# Patient Record
Sex: Male | Born: 1955
Health system: Southern US, Community
[De-identification: ages and names within clinical notes are randomized; demographics above are authoritative.]

## PROBLEM LIST (undated history)

## (undated) DIAGNOSIS — N3289 Other specified disorders of bladder: Secondary | ICD-10-CM

## (undated) DIAGNOSIS — I10 Essential (primary) hypertension: Secondary | ICD-10-CM

## (undated) DIAGNOSIS — Z860100 Personal history of colon polyps, unspecified: Secondary | ICD-10-CM

## (undated) DIAGNOSIS — R339 Retention of urine, unspecified: Secondary | ICD-10-CM

## (undated) DIAGNOSIS — M1990 Unspecified osteoarthritis, unspecified site: Secondary | ICD-10-CM

## (undated) DIAGNOSIS — I82409 Acute embolism and thrombosis of unspecified deep veins of unspecified lower extremity: Secondary | ICD-10-CM

## (undated) DIAGNOSIS — N400 Enlarged prostate without lower urinary tract symptoms: Secondary | ICD-10-CM

## (undated) DIAGNOSIS — N1330 Unspecified hydronephrosis: Secondary | ICD-10-CM

## (undated) DIAGNOSIS — K219 Gastro-esophageal reflux disease without esophagitis: Secondary | ICD-10-CM

## (undated) HISTORY — PX: KNEE ARTHROSCOPY: SUR90

## (undated) HISTORY — DX: Morbid (severe) obesity due to excess calories: E66.01

## (undated) HISTORY — PX: HAND SURGERY: SHX662

## (undated) HISTORY — PX: QUADRICEPS TENDON REPAIR: SHX756

---

## 2003-02-24 ENCOUNTER — Ambulatory Visit (HOSPITAL_COMMUNITY): Admission: RE | Admit: 2003-02-24 | Discharge: 2003-02-24 | Payer: Self-pay | Admitting: Orthopedic Surgery

## 2003-02-24 ENCOUNTER — Ambulatory Visit (HOSPITAL_BASED_OUTPATIENT_CLINIC_OR_DEPARTMENT_OTHER): Admission: RE | Admit: 2003-02-24 | Discharge: 2003-02-24 | Payer: Self-pay | Admitting: Orthopedic Surgery

## 2007-12-01 ENCOUNTER — Encounter: Admission: RE | Admit: 2007-12-01 | Discharge: 2007-12-01 | Payer: Self-pay | Admitting: Orthopaedic Surgery

## 2010-06-14 ENCOUNTER — Ambulatory Visit (HOSPITAL_COMMUNITY)
Admission: RE | Admit: 2010-06-14 | Discharge: 2010-06-14 | Disposition: A | Discharge status: Home / Self Care | Payer: PRIVATE HEALTH INSURANCE | Source: Ambulatory Visit | Attending: Gastroenterology | Admitting: Gastroenterology

## 2010-06-14 ENCOUNTER — Other Ambulatory Visit: Payer: Self-pay | Admitting: Gastroenterology

## 2010-06-14 DIAGNOSIS — Z1211 Encounter for screening for malignant neoplasm of colon: Secondary | ICD-10-CM | POA: Insufficient documentation

## 2010-06-14 DIAGNOSIS — K219 Gastro-esophageal reflux disease without esophagitis: Secondary | ICD-10-CM | POA: Insufficient documentation

## 2010-06-14 DIAGNOSIS — D128 Benign neoplasm of rectum: Secondary | ICD-10-CM | POA: Insufficient documentation

## 2010-06-14 DIAGNOSIS — Z79899 Other long term (current) drug therapy: Secondary | ICD-10-CM | POA: Insufficient documentation

## 2010-06-14 DIAGNOSIS — E785 Hyperlipidemia, unspecified: Secondary | ICD-10-CM | POA: Insufficient documentation

## 2010-06-14 DIAGNOSIS — D126 Benign neoplasm of colon, unspecified: Secondary | ICD-10-CM | POA: Insufficient documentation

## 2010-06-14 DIAGNOSIS — I10 Essential (primary) hypertension: Secondary | ICD-10-CM | POA: Insufficient documentation

## 2010-06-14 HISTORY — PX: COLONOSCOPY: SHX174

## 2010-06-17 NOTE — Op Note (Signed)
  NAME:  ELON, EOFF NO.:  [id number]  MEDICAL RECORD NO.:  [id number]           PATIENT TYPE:  O  LOCATION:  WLEN                         FACILITY:  [facility]  PHYSICIAN:  Danise Edge, M.D.   DATE OF BIRTH:  [birth year]  DATE OF PROCEDURE:  06/14/2010 DATE OF DISCHARGE:                              OPERATIVE REPORT   HISTORY:  Mr. Stephen Maldonado is a 55 year old male born Jun 28, 1955. The patient is scheduled to undergo his first screening colonoscopy with polypectomy to prevent colon cancer.  ENDOSCOPIST:  Danise Edge, M.D.  PREMEDICATION:  Fentanyl 100 mcg, Versed 10 mg.  PROCEDURE:  After obtaining informed consent, the patient was placed in the left lateral decubitus position.  Anal inspection and digital rectal examination were normal.  The Pentax pediatric colonoscope was introduced into the rectum and easily advanced to the cecum.  A normal- appearing ileocecal valve and appendiceal orifice were identified. Colonic preparation for the examination today was good.  Rectum.  From the distal rectum a 3-mm sessile polyp was removed with the cold biopsy forceps. Sigmoid colon and descending colon.  Colonic diverticulosis.  Two 3-mm sized polyps were removed from the descending colon with the cold biopsy forceps. Splenic flexure normal. Transverse colon normal. Hepatic flexure normal. Ascending colon normal. Cecum and ileocecal valve normal.  ASSESSMENT: 1. Left colonic diverticulosis. 2. A diminutive polyp was removed from the distal rectum with the cold     biopsy forceps. 3. Two diminutive polyps were removed from the descending colon with     the cold biopsy forceps.  RECOMMENDATIONS:  If the colon polyps return neoplastic pathologically, the patient should undergo a surveillance colonoscopy in 5 years.  If the colon polyps return nonneoplastic pathologically, the patient should undergo a repeat screening colonoscopy in 10  years.          ______________________________ Danise Edge, M.D.     MJ/MEDQ  D:  06/14/2010  T:  06/14/2010  Job:  [id number]  cc:   Pearla Dubonnet, M.D. Fax: [phone]  Electronically Signed by Danise Edge M.D. on 06/17/2010 08:53:30 AM

## 2010-06-22 NOTE — Op Note (Signed)
NAME:  Stephen Maldonado, Stephen Maldonado NO.:  [id number]   MEDICAL RECORD NO.:  [id number]                   PATIENT TYPE:  AMB   LOCATION:  DSC                                  FACILITY:  [facility]   PHYSICIAN:  Loreta Ave, M.D.              DATE OF BIRTH:  1955/08/04   DATE OF PROCEDURE:  02/24/2003  DATE OF DISCHARGE:                                 OPERATIVE REPORT   PREOPERATIVE DIAGNOSIS:  Subacute complete tear quadriceps tendon near  inferior pole patella, left distal thigh.   POSTOPERATIVE DIAGNOSIS:  Subacute complete tear quadriceps tendon near  inferior pole patella, left distal thigh.   OPERATIVE PROCEDURE:  Exploration, debridement, and then primary repair of  quadriceps tendon just above the superior pole of the patella utilizing #2  FiberWire suture x 4.   SURGEON:  Loreta Ave, M.D.   ASSISTANT:  Arlys Heliodoro D. Petrarca, P.A.-C.   ANESTHESIA:  General.   ESTIMATED BLOOD LOSS:  Minimal.   SPECIMENS:  None.   CULTURES:  None.   COMPLICATIONS:  None.   DRESSINGS:  Soft compressive with a Bledsoe brace locked in full extension.   PROCEDURE:  The patient was brought to the operating room and placed on the  operating table in supine position.  After adequate anesthesia had been  obtained, the left knee was examined.  Fairly good, almost full passive  motion.  Good stability.  Obvious defect in the quad tendon just above the  superior pole of the patella.  Tourniquet applied, prepped and draped in the  usual sterile fashion.  Exsanguinated with elevation of Esmarch and  tourniquet inflated to 350 mmHg.  A longitudinal incision centered over the  quadriceps tendon.  Skin and subcutaneous tissue divided.  Reactive fluid  debrided throughout.  The tear was very evident extending just in front of  the midline from medial to lateral through the quadriceps tendon as well as  a portion of the VMO musculature on the medial aspect.  There was about  1.5  cm of tendon still left above the patella, itself.  Very rounded off  margins.  All fluid was completed irrigated.  All tissue was mobilized to  get it end to end easily.  The joint was inspected through the defect with  no other gross findings.  The margins of the tendon was then debrided back  to healthy, bleeding tissue on either side.  The tendon was well captured  with FiberWire suture #2 which was weaved x 2 at the proximal end and x 2 at  the distal end.  Once this was firmly anchored above and below with  additional sutures being extended over the front of the patella and on  either side, the knee was extended and all sutures were tied end-to-end to  give me a nice firm closure and repair of the tendon.  Sutures from the  distal  limb were then further weaved up into the quad tendon and tied again  to themselves.  At completion and after freeing up the adhesions of the  proximal end of the tendon and musculature, I could bring it to 90 degrees  without too much tension on the repair.  The wound was thoroughly irrigated.  The repair was oversewn with Vicryl to get as smooth a contour.  The  subcutaneous tissue was closed with Vicryl and the skin was closed with  staples.  The margins of the wound were injected with Marcaine.  A sterile  compressive dressing was applied.  The tourniquet was inflated and removed.  The knee immobilizer was applied.  Anesthesia reversed.  Brought to the  recovery room.  Tolerated the surgery well without complications.                                               Loreta Ave, M.D.    DFM/MEDQ  D:  02/24/2003  T:  02/24/2003  Job:  [id number]

## 2015-08-23 DIAGNOSIS — I82409 Acute embolism and thrombosis of unspecified deep veins of unspecified lower extremity: Secondary | ICD-10-CM | POA: Diagnosis not present

## 2015-08-23 DIAGNOSIS — R300 Dysuria: Secondary | ICD-10-CM | POA: Diagnosis not present

## 2015-08-23 DIAGNOSIS — R600 Localized edema: Secondary | ICD-10-CM | POA: Diagnosis not present

## 2015-08-23 DIAGNOSIS — R972 Elevated prostate specific antigen [PSA]: Secondary | ICD-10-CM | POA: Diagnosis not present

## 2015-08-23 DIAGNOSIS — I10 Essential (primary) hypertension: Secondary | ICD-10-CM | POA: Diagnosis not present

## 2015-08-23 DIAGNOSIS — N19 Unspecified kidney failure: Secondary | ICD-10-CM | POA: Diagnosis not present

## 2015-08-25 ENCOUNTER — Other Ambulatory Visit: Payer: Self-pay | Admitting: Internal Medicine

## 2015-08-25 DIAGNOSIS — N19 Unspecified kidney failure: Secondary | ICD-10-CM

## 2015-08-28 ENCOUNTER — Ambulatory Visit
Admission: RE | Admit: 2015-08-28 | Discharge: 2015-08-28 | Disposition: A | Discharge status: Home / Self Care | Payer: BLUE CROSS/BLUE SHIELD | Source: Ambulatory Visit | Attending: Internal Medicine | Admitting: Internal Medicine

## 2015-08-28 DIAGNOSIS — N19 Unspecified kidney failure: Secondary | ICD-10-CM

## 2015-08-28 DIAGNOSIS — N1330 Unspecified hydronephrosis: Secondary | ICD-10-CM | POA: Diagnosis not present

## 2015-08-30 DIAGNOSIS — R338 Other retention of urine: Secondary | ICD-10-CM | POA: Diagnosis not present

## 2015-08-30 DIAGNOSIS — R972 Elevated prostate specific antigen [PSA]: Secondary | ICD-10-CM | POA: Diagnosis not present

## 2015-08-30 DIAGNOSIS — R3121 Asymptomatic microscopic hematuria: Secondary | ICD-10-CM | POA: Diagnosis not present

## 2015-08-30 DIAGNOSIS — R3912 Poor urinary stream: Secondary | ICD-10-CM | POA: Diagnosis not present

## 2015-08-30 DIAGNOSIS — N1330 Unspecified hydronephrosis: Secondary | ICD-10-CM | POA: Diagnosis not present

## 2015-09-11 DIAGNOSIS — N184 Chronic kidney disease, stage 4 (severe): Secondary | ICD-10-CM | POA: Diagnosis not present

## 2015-09-15 DIAGNOSIS — N1330 Unspecified hydronephrosis: Secondary | ICD-10-CM | POA: Diagnosis not present

## 2015-09-21 DIAGNOSIS — R338 Other retention of urine: Secondary | ICD-10-CM | POA: Diagnosis not present

## 2015-09-21 DIAGNOSIS — N401 Enlarged prostate with lower urinary tract symptoms: Secondary | ICD-10-CM | POA: Diagnosis not present

## 2015-09-21 DIAGNOSIS — N1330 Unspecified hydronephrosis: Secondary | ICD-10-CM | POA: Diagnosis not present

## 2015-09-21 DIAGNOSIS — R3121 Asymptomatic microscopic hematuria: Secondary | ICD-10-CM | POA: Diagnosis not present

## 2015-09-22 ENCOUNTER — Other Ambulatory Visit: Payer: Self-pay | Admitting: Urology

## 2015-09-26 ENCOUNTER — Encounter (HOSPITAL_BASED_OUTPATIENT_CLINIC_OR_DEPARTMENT_OTHER): Payer: Self-pay | Admitting: *Deleted

## 2015-09-26 NOTE — Progress Notes (Signed)
NPO AFTER MN.  ARRIVE AT 0945.  NEEDS ISTAT AND EKG.  WILL TAKE AM MEDS DOS W/ SIPS OF WATER AND IF NEEDED TAKE DITROPAN/ TYLENOL/ OXYCODONE.

## 2015-10-02 NOTE — H&P (Signed)
Office Visit Report     09/21/2015   --------------------------------------------------------------------------------   Stephen Maldonado  MRN: [id number]  PRIMARY CARE:  Ria Bush, MD  DOB: [birth year], 60 year old Male  REFERRING:  R Marcellus Scott, MD  SSN: -[id number]  PROVIDER:  [name], M.D.    LOCATION:  [facility] [phone]   --------------------------------------------------------------------------------   CC: I have urinary retention.  HPI: Stephen Maldonado is a 60 year-old male established patient who is here for urinary retention.  His problem was diagnosed 08/28/2015. His current symptoms did not begin after he had a surgical procedure. His urinary retention is being treated with foley catheter. Patient denies suprapubic tube, intemittent catheterization, flomax, hytrin, cardura, uroxatrol, rapaflo, avodart, and proscar.     CC: I have hydronephrosis.  HPI: His problem was diagnosed 08/28/2015. The problem is on both sides. He had the following x-rays done: Renal Ultrasound.   Kidneys appeared to have parenchymal atrophy. CKD Aug 24, 2015 Cr 3.67. Bladder distended - about 2.2 L. Foley placed 7/26.   follow-up CT scan revealed a decompressed bladder with decreased hydronephrosis. The calyces and the UPJ for example measured smaller than the prior ultrasound.   he saw Dr. Hollie Salk and had repeat labs September 11, 2015 with a BUN of 26, creatinine 2.07 and GFR 34.     CC/HPI: I have an enlarged prostate (follow-up).     Symptoms since 2012.     CC: I have blood in my urine.  HPI: He did not see the blood in his urine. He first noticed the blood 08/24/2015. He has not seen blood clots.   He does not have a burning sensation when he urinates. He is currently having trouble urinating.   His last U/S or CT Scan was 09/15/2015.   5-20 rbc's on U/A, few bac, no WBC's. Assoc with DVT, obesity and Eliquis use.   CT 09/15/2015 revealed some residual  hydroureteronephrosis bilaterally. A large capacity bladder which was decompressed with the Foley catheter. Prostate measured about 95 g. No worrisome findings.     CC: Elevated PSA-Established Patient  HPI: His last PSA was performed 08/24/2015. The last PSA value was 4.84.   PSA overall stable. His PSA Apr 2012 5.1, May 2012 was 4.87, July 2012 4.97. I located old records in our system.      ALLERGIES: No Allergies    MEDICATIONS: Nexium  Oxybutynin Chloride 5 mg tablet 1 tablet PO TID  Tamsulosin Hcl 0.4 mg capsule, ext release 24 hr 1 capsule PO Q HS  Diovan TABS Oral  Pain Ease     GU PSH: No GU PSH      PSH Notes: Quadricepsplasty, Quadricepsplasty, Knee Arthroscopy   NON-GU PSH: Knee Arthroscopy/surgery, Bilateral Revise Thigh Muscles - 2012, 2012    GU PMH: Asymptomatic microscopic hematuria - 08/30/2015 Elevated PSA - 08/30/2015, Elevated PSA, Elevated prostate specific antigen (PSA) - 2014 Hydronephrosis Unspec - 08/30/2015 Urinary Retention - 08/30/2015 Weak Urinary Stream - 08/30/2015      PMH Notes:  1898-02-04 00:00:00 - Note: Normal Routine History And Physical Adult   NON-GU PMH: Cardiac murmur, unspecified, Murmurs - 2014 Gout, unspecified, Gout - 2014 Acute embolism and thrombosis of unspecified deep veins of left lower extremity Essential (primary) hypertension Gastro-esophageal reflux disease without esophagitis Unspecified osteoarthritis, unspecified site    FAMILY HISTORY: 1 Daughter - Runs in Family Cancer - Mother Congestive Heart Failure - Father Diabetes - Mother Family Health Status Number -  Runs In Family Father Deceased At [place] ___ - Runs In Family Heart Disease - Father Mother Deceased At Age 59 from diabetic complicati - Runs In Family   SOCIAL HISTORY: Marital Status: Married Current Smoking Status: Patient has never smoked.  Drinks 2 drinks per day.  Drinks 2 caffeinated drinks per day.     Notes: Occupation:, Never A Smoker,  Marital History - Single, Caffeine Use, Alcohol Use   REVIEW OF SYSTEMS:    GU Review Male:   Patient denies frequent urination, hard to postpone urination, burning/ pain with urination, get up at night to urinate, leakage of urine, stream starts and stops, trouble starting your stream, have to strain to urinate , erection problems, and penile pain.  Gastrointestinal (Upper):   Patient denies nausea, vomiting, and indigestion/ heartburn.  Gastrointestinal (Lower):   Patient denies diarrhea and constipation.  Constitutional:   Patient denies fever, night sweats, weight loss, and fatigue.  Skin:   Patient denies skin rash/ lesion and itching.  Eyes:   Patient denies blurred vision and double vision.  Ears/ Nose/ Throat:   Patient denies sore throat and sinus problems.  Hematologic/Lymphatic:   Patient denies swollen glands and easy bruising.  Cardiovascular:   Patient denies leg swelling and chest pains.  Respiratory:   Patient denies cough and shortness of breath.  Endocrine:   Patient denies excessive thirst.  Musculoskeletal:   Patient reports back pain. Patient denies joint pain.  Neurological:   Patient denies headaches and dizziness.  Psychologic:   Patient denies depression and anxiety.   VITAL SIGNS:      09/21/2015 08:25 AM  BP 163/96 mmHg  Heart Rate 58 /min  Temperature 98.2 F / 37 C   MULTI-SYSTEM PHYSICAL EXAMINATION:    Constitutional: Well-nourished. No physical deformities. Normally developed. Good grooming.  Neck: Neck symmetrical, not swollen. Normal tracheal position.  Respiratory: No labored breathing, no use of accessory muscles.   Cardiovascular: Normal temperature, normal extremity pulses, no swelling, no varicosities.  Skin: No paleness, no jaundice, no cyanosis. No lesion, no ulcer, no rash.  Neurologic / Psychiatric: Oriented to time, oriented to place, oriented to person. No depression, no anxiety, no agitation.     PAST DATA REVIEWED:  Source Of History:   Patient   06/06/10  PSA  Total PSA 4.87   Free PSA 0.5   % Free PSA 10     PROCEDURES:         Flexible Cystoscopy - 52000  Risks, benefits, and some of the potential complications of the procedure were discussed with the patient. All questions were answered. Informed consent was obtained. Antibiotic prophylaxis was given -- Cipro. Sterile technique and intraurethral analgesia were used. He was filled with 300 ml. He felt the urge to void but could not.   Meatus:  Normal size. Normal location. Normal condition.  Urethra:  No strictures.  External Sphincter:  Normal.  Verumontanum:  Normal.  Prostate:  Obstructing - primarily a large left lateral lobe. Moderate hyperplasia.  Bladder Neck:  Non-obstructing.  Ureteral Orifices:  Normal location. Normal size. Normal shape. Effluxed clear urine.  Bladder:  No trabeculation. No tumors. Normal mucosa. No stones.      The lower urinary tract was carefully examined. The procedure was well-tolerated and without complications. Antibiotic instructions were given. Instructions were given to call the office immediately for bloody urine, difficulty urinating, painful urination, fever, chills, nausea, vomiting or other illness. The patient stated that he understood these instructions and  would comply with them.         Catheter / SP Tube - [id number] Simple Foley Catheterization  A 16 French Foley catheter was inserted into the bladder using sterile technique. The patient was taught routine catheter care. A urine culture was sent to the lab. A leg bag was connected.         Urinalysis w/Scope - 81001 Dipstick Dipstick Cont'd Micro  Specimen: Voided Bilirubin: Neg WBC/hpf: 10-20/hpf  Color: Yellow Ketones: Neg RBC/hpf: 20-40/hpf  Appearance: Cloudy Blood: 3+ Bacteria: Moderate (26-50/hpf)  Specific Gravity: 1.020 Protein: 3+ Cystals: Amorph Urates  pH: 6.5 Urobilinogen: 0.2 Casts: NS (Not Seen)  Glucose: Neg Nitrites: Neg Trichomonas: Not Present     Leukocyte Esterase: 3+ Mucous: Not Present      Epithelial Cells: 0-5/hpf      Yeast: NS (Not Seen)      Sperm: Not Present    ASSESSMENT:      ICD-10 Details  1 GU:   Urinary Retention - R33.8   2   Hydronephrosis Unspec - N13.30   3   Asymptomatic microscopic hematuria - R31.21   4   BPH w/LUTS - N40.1   5   Weak Urinary Stream - R39.12    PLAN:           Orders Labs Urine Culture and Sensitivity          Schedule Return Visit: Next Available Appointment - Schedule Surgery          Document Letter(s):  Created for Patient: Clinical Summary         Notes:   urinary retention-likely from chronic bladder outlet obstruction from BPH. He could have a hypotonic bladder at this point and we discussed urodynamics. He does not do well with catheterization or the cystoscopy and therefore we will hold off on that test. We also extensively discussed CIC and he cannot bring himself to do that.   BPH-he is on tamsulosin.we discussed the nature risks and benefits of adding a 5 alpha reductase inhibitor. We discussed the nature risks benefits and alternatives to outlet procedures. We discussed risk of continued retention, incontinence, stricture, erectile dysfunction, need for staged procedure, bleeding among others. We discussed in most cases flow symptoms and emptying improve but again there may be an issue with his bladder being hypotonic.   hydronephrosis-improved on imaging and labs. Will plan RGP's.   MH - benign eval.   PSA- stable.   cc: Dr. Inda Merlin     * Signed by [name], M.D. on 09/21/15 at 1:42 PM (EDT)*     The information contained in this medical record document is considered private and confidential patient information. This information can only be used for the medical diagnosis and/or medical services that are being provided by the patient's selected caregivers. This information can only be distributed outside of the patient's care if the patient agrees and signs  waivers of authorization for this information to be sent to an outside source or route.

## 2015-10-03 ENCOUNTER — Ambulatory Visit (HOSPITAL_BASED_OUTPATIENT_CLINIC_OR_DEPARTMENT_OTHER): Payer: BLUE CROSS/BLUE SHIELD | Admitting: Certified Registered"

## 2015-10-03 ENCOUNTER — Encounter (HOSPITAL_BASED_OUTPATIENT_CLINIC_OR_DEPARTMENT_OTHER): Payer: Self-pay | Admitting: *Deleted

## 2015-10-03 ENCOUNTER — Ambulatory Visit (HOSPITAL_BASED_OUTPATIENT_CLINIC_OR_DEPARTMENT_OTHER)
Admission: RE | Admit: 2015-10-03 | Discharge: 2015-10-03 | Disposition: A | Discharge status: Home / Self Care | Payer: BLUE CROSS/BLUE SHIELD | Source: Ambulatory Visit | Attending: Urology | Admitting: Urology

## 2015-10-03 ENCOUNTER — Encounter (HOSPITAL_BASED_OUTPATIENT_CLINIC_OR_DEPARTMENT_OTHER)
Admission: RE | Disposition: A | Discharge status: Home / Self Care | Payer: Self-pay | Source: Ambulatory Visit | Attending: Urology

## 2015-10-03 ENCOUNTER — Ambulatory Visit (HOSPITAL_COMMUNITY): Payer: BLUE CROSS/BLUE SHIELD

## 2015-10-03 DIAGNOSIS — N1370 Vesicoureteral-reflux, unspecified: Secondary | ICD-10-CM | POA: Insufficient documentation

## 2015-10-03 DIAGNOSIS — Z79899 Other long term (current) drug therapy: Secondary | ICD-10-CM | POA: Diagnosis not present

## 2015-10-03 DIAGNOSIS — N411 Chronic prostatitis: Secondary | ICD-10-CM | POA: Insufficient documentation

## 2015-10-03 DIAGNOSIS — M1990 Unspecified osteoarthritis, unspecified site: Secondary | ICD-10-CM | POA: Insufficient documentation

## 2015-10-03 DIAGNOSIS — N401 Enlarged prostate with lower urinary tract symptoms: Secondary | ICD-10-CM | POA: Insufficient documentation

## 2015-10-03 DIAGNOSIS — N1330 Unspecified hydronephrosis: Secondary | ICD-10-CM | POA: Diagnosis not present

## 2015-10-03 DIAGNOSIS — R339 Retention of urine, unspecified: Secondary | ICD-10-CM | POA: Diagnosis present

## 2015-10-03 DIAGNOSIS — R338 Other retention of urine: Secondary | ICD-10-CM | POA: Diagnosis not present

## 2015-10-03 DIAGNOSIS — R3912 Poor urinary stream: Secondary | ICD-10-CM | POA: Insufficient documentation

## 2015-10-03 DIAGNOSIS — K219 Gastro-esophageal reflux disease without esophagitis: Secondary | ICD-10-CM | POA: Insufficient documentation

## 2015-10-03 DIAGNOSIS — I10 Essential (primary) hypertension: Secondary | ICD-10-CM | POA: Insufficient documentation

## 2015-10-03 DIAGNOSIS — Z6837 Body mass index (BMI) 37.0-37.9, adult: Secondary | ICD-10-CM | POA: Diagnosis not present

## 2015-10-03 HISTORY — DX: Benign prostatic hyperplasia without lower urinary tract symptoms: N40.0

## 2015-10-03 HISTORY — DX: Other specified disorders of bladder: N32.89

## 2015-10-03 HISTORY — DX: Personal history of colon polyps, unspecified: Z86.0100

## 2015-10-03 HISTORY — DX: Personal history of colonic polyps: Z86.010

## 2015-10-03 HISTORY — DX: Retention of urine, unspecified: R33.9

## 2015-10-03 HISTORY — DX: Unspecified osteoarthritis, unspecified site: M19.90

## 2015-10-03 HISTORY — PX: CYSTOSCOPY W/ RETROGRADES: SHX1426

## 2015-10-03 HISTORY — PX: GREEN LIGHT LASER TURP (TRANSURETHRAL RESECTION OF PROSTATE: SHX6260

## 2015-10-03 HISTORY — DX: Unspecified hydronephrosis: N13.30

## 2015-10-03 HISTORY — DX: Essential (primary) hypertension: I10

## 2015-10-03 HISTORY — DX: Gastro-esophageal reflux disease without esophagitis: K21.9

## 2015-10-03 LAB — POCT I-STAT 4, (NA,K, GLUC, HGB,HCT)
Glucose, Bld: 102 mg/dL — ABNORMAL HIGH (ref 65–99)
HCT: 36 % — ABNORMAL LOW (ref 39.0–52.0)
Hemoglobin: 12.2 g/dL — ABNORMAL LOW (ref 13.0–17.0)
Potassium: 4.4 mmol/L (ref 3.5–5.1)
SODIUM: 143 mmol/L (ref 135–145)

## 2015-10-03 SURGERY — GREEN LIGHT LASER TURP (TRANSURETHRAL RESECTION OF PROSTATE
Anesthesia: General | Site: Prostate

## 2015-10-03 MED ORDER — DEXTROSE 5 % IV SOLN
3.0000 g | INTRAVENOUS | Status: AC
  Administered 2015-10-03: 3 g via INTRAVENOUS
  Filled 2015-10-03: qty 3000

## 2015-10-03 MED ORDER — LIDOCAINE HCL (CARDIAC) 20 MG/ML IV SOLN
INTRAVENOUS | Status: AC
  Filled 2015-10-03: qty 5

## 2015-10-03 MED ORDER — OXYCODONE-ACETAMINOPHEN 5-325 MG PO TABS
1.0000 | ORAL_TABLET | ORAL | Status: DC | PRN
  Administered 2015-10-03: 1 via ORAL
  Filled 2015-10-03: qty 2

## 2015-10-03 MED ORDER — OXYCODONE-ACETAMINOPHEN 5-325 MG PO TABS
ORAL_TABLET | ORAL | Status: AC
Start: 2015-10-03 — End: 2015-10-03
  Filled 2015-10-03: qty 1

## 2015-10-03 MED ORDER — ONDANSETRON HCL 4 MG/2ML IJ SOLN
INTRAMUSCULAR | Status: AC
  Filled 2015-10-03: qty 2

## 2015-10-03 MED ORDER — FUROSEMIDE 10 MG/ML IJ SOLN
INTRAMUSCULAR | Status: AC
  Filled 2015-10-03: qty 2

## 2015-10-03 MED ORDER — PROPOFOL 10 MG/ML IV BOLUS
INTRAVENOUS | Status: AC
  Filled 2015-10-03: qty 20

## 2015-10-03 MED ORDER — BELLADONNA ALKALOIDS-OPIUM 16.2-60 MG RE SUPP
RECTAL | Status: AC
  Filled 2015-10-03: qty 1

## 2015-10-03 MED ORDER — CEFAZOLIN IN D5W 1 GM/50ML IV SOLN
1.0000 g | INTRAVENOUS | Status: DC
  Filled 2015-10-03: qty 50

## 2015-10-03 MED ORDER — PROMETHAZINE HCL 25 MG/ML IJ SOLN
6.2500 mg | INTRAMUSCULAR | Status: DC | PRN
  Filled 2015-10-03: qty 1

## 2015-10-03 MED ORDER — MIDAZOLAM HCL 2 MG/2ML IJ SOLN
INTRAMUSCULAR | Status: AC
  Filled 2015-10-03: qty 2

## 2015-10-03 MED ORDER — FENTANYL CITRATE (PF) 100 MCG/2ML IJ SOLN
INTRAMUSCULAR | Status: AC
Start: 2015-10-03 — End: 2015-10-03
  Filled 2015-10-03: qty 2

## 2015-10-03 MED ORDER — FENTANYL CITRATE (PF) 100 MCG/2ML IJ SOLN
INTRAMUSCULAR | Status: DC | PRN
  Administered 2015-10-03: 50 ug via INTRAVENOUS
  Administered 2015-10-03 (×2): 25 ug via INTRAVENOUS
  Administered 2015-10-03: 50 ug via INTRAVENOUS
  Administered 2015-10-03 (×2): 25 ug via INTRAVENOUS

## 2015-10-03 MED ORDER — OXYCODONE-ACETAMINOPHEN 5-325 MG PO TABS: 1.0000 | ORAL_TABLET | ORAL | 0 refills | Status: DC | PRN

## 2015-10-03 MED ORDER — CEFAZOLIN IN D5W 1 GM/50ML IV SOLN
INTRAVENOUS | Status: AC
  Filled 2015-10-03: qty 50

## 2015-10-03 MED ORDER — ONDANSETRON HCL 4 MG/2ML IJ SOLN
INTRAMUSCULAR | Status: DC | PRN
Start: 2015-10-03 — End: 2015-10-03
  Administered 2015-10-03: 4 mg via INTRAVENOUS

## 2015-10-03 MED ORDER — FENTANYL CITRATE (PF) 100 MCG/2ML IJ SOLN
25.0000 ug | INTRAMUSCULAR | Status: DC | PRN
  Administered 2015-10-03: 25 ug via INTRAVENOUS
  Filled 2015-10-03: qty 1

## 2015-10-03 MED ORDER — MIDAZOLAM HCL 2 MG/2ML IJ SOLN
0.5000 mg | Freq: Once | INTRAMUSCULAR | Status: DC | PRN
  Filled 2015-10-03: qty 2

## 2015-10-03 MED ORDER — FENTANYL CITRATE (PF) 100 MCG/2ML IJ SOLN
INTRAMUSCULAR | Status: AC
  Filled 2015-10-03: qty 2

## 2015-10-03 MED ORDER — CEFAZOLIN SODIUM-DEXTROSE 2-4 GM/100ML-% IV SOLN
INTRAVENOUS | Status: AC
  Filled 2015-10-03: qty 100

## 2015-10-03 MED ORDER — MEPERIDINE HCL 25 MG/ML IJ SOLN
6.2500 mg | INTRAMUSCULAR | Status: DC | PRN
  Filled 2015-10-03: qty 1

## 2015-10-03 MED ORDER — PROPOFOL 10 MG/ML IV BOLUS
INTRAVENOUS | Status: DC | PRN
  Administered 2015-10-03: 250 mg via INTRAVENOUS

## 2015-10-03 MED ORDER — MIDAZOLAM HCL 5 MG/5ML IJ SOLN
INTRAMUSCULAR | Status: DC | PRN
  Administered 2015-10-03: 2 mg via INTRAVENOUS

## 2015-10-03 MED ORDER — SODIUM CHLORIDE 0.9 % IR SOLN
Status: DC | PRN
  Administered 2015-10-03: 25000 mL

## 2015-10-03 MED ORDER — IOHEXOL 300 MG/ML  SOLN
INTRAMUSCULAR | Status: DC | PRN
  Administered 2015-10-03: 46 mL

## 2015-10-03 MED ORDER — FUROSEMIDE 10 MG/ML IJ SOLN
INTRAMUSCULAR | Status: DC | PRN
  Administered 2015-10-03: 10 mg via INTRAMUSCULAR

## 2015-10-03 MED ORDER — BELLADONNA ALKALOIDS-OPIUM 16.2-60 MG RE SUPP
RECTAL | Status: DC | PRN
  Administered 2015-10-03: 1 via RECTAL

## 2015-10-03 MED ORDER — SULFAMETHOXAZOLE-TRIMETHOPRIM 400-80 MG PO TABS: 1.0000 | ORAL_TABLET | Freq: Every day | ORAL | 0 refills | Status: DC

## 2015-10-03 MED ORDER — DEXAMETHASONE SODIUM PHOSPHATE 4 MG/ML IJ SOLN
INTRAMUSCULAR | Status: DC | PRN
  Administered 2015-10-03: 10 mg via INTRAVENOUS

## 2015-10-03 MED ORDER — LACTATED RINGERS IV SOLN
INTRAVENOUS | Status: DC
Start: 2015-10-03 — End: 2015-10-03
  Administered 2015-10-03 (×2): via INTRAVENOUS
  Filled 2015-10-03: qty 1000

## 2015-10-03 MED ORDER — LIDOCAINE 2% (20 MG/ML) 5 ML SYRINGE
INTRAMUSCULAR | Status: DC | PRN
  Administered 2015-10-03: 60 mg via INTRAVENOUS

## 2015-10-03 MED ORDER — DEXAMETHASONE SODIUM PHOSPHATE 10 MG/ML IJ SOLN
INTRAMUSCULAR | Status: AC
  Filled 2015-10-03: qty 1

## 2015-10-03 SURGICAL SUPPLY — 46 items
BAG DRAIN URO-CYSTO SKYTR STRL (DRAIN) ×3 IMPLANT
BAG URINE DRAINAGE (UROLOGICAL SUPPLIES) ×6 IMPLANT
BASKET LASER NITINOL 1.9FR (BASKET) IMPLANT
BASKET STNLS GEMINI 4WIRE 3FR (BASKET) IMPLANT
BASKET ZERO TIP NITINOL 2.4FR (BASKET) IMPLANT
CATH COUDE FOLEY 2W 5CC 18FR (CATHETERS) ×3 IMPLANT
CATH FOLEY 2WAY SLVR  5CC 18FR (CATHETERS)
CATH FOLEY 2WAY SLVR 5CC 18FR (CATHETERS) IMPLANT
CATH INTERMIT  6FR 70CM (CATHETERS) ×3 IMPLANT
CATH URET 5FR 28IN CONE TIP (BALLOONS)
CATH URET 5FR 28IN OPEN ENDED (CATHETERS) IMPLANT
CATH URET 5FR 70CM CONE TIP (BALLOONS) IMPLANT
CATH URET DUAL LUMEN 6-10FR 50 (CATHETERS) IMPLANT
CLOTH BEACON ORANGE TIMEOUT ST (SAFETY) ×3 IMPLANT
ELECT BIVAP BIPO 22/24 DONUT (ELECTROSURGICAL)
ELECT LOOP MED HF 24F 12D (CUTTING LOOP) IMPLANT
ELECT REM PT RETURN 9FT ADLT (ELECTROSURGICAL)
ELECTRD BIVAP BIPO 22/24 DONUT (ELECTROSURGICAL) IMPLANT
ELECTRODE REM PT RTRN 9FT ADLT (ELECTROSURGICAL) IMPLANT
FIBER LASER FLEXIVA 365 (UROLOGICAL SUPPLIES) IMPLANT
FIBER LASER TRAC TIP (UROLOGICAL SUPPLIES) IMPLANT
GLOVE BIO SURGEON STRL SZ7.5 (GLOVE) ×3 IMPLANT
GOWN STRL REUS W/ TWL XL LVL3 (GOWN DISPOSABLE) ×2 IMPLANT
GOWN STRL REUS W/TWL LRG LVL3 (GOWN DISPOSABLE) ×3 IMPLANT
GOWN STRL REUS W/TWL XL LVL3 (GOWN DISPOSABLE) ×1
GUIDEWIRE 0.038 PTFE COATED (WIRE) IMPLANT
GUIDEWIRE ANG ZIPWIRE 038X150 (WIRE) IMPLANT
GUIDEWIRE STR DUAL SENSOR (WIRE) IMPLANT
HOLDER FOLEY CATH W/STRAP (MISCELLANEOUS) ×3 IMPLANT
IV NS 1000ML (IV SOLUTION) ×1
IV NS 1000ML BAXH (IV SOLUTION) ×2 IMPLANT
IV NS IRRIG 3000ML ARTHROMATIC (IV SOLUTION) ×24 IMPLANT
IV SET EXTENSION GRAVITY 40 LF (IV SETS) ×3 IMPLANT
KIT BALLIN UROMAX 15FX10 (LABEL) IMPLANT
KIT BALLN UROMAX 15FX4 (MISCELLANEOUS) IMPLANT
KIT BALLN UROMAX 26 75X4 (MISCELLANEOUS)
KIT ROOM TURNOVER WOR (KITS) ×3 IMPLANT
LASER FIBER /GREENLIGHT LASER (Laser) ×3 IMPLANT
LASER GREENLIGHT RENTAL P/PROC (Laser) ×3 IMPLANT
LOOP CUT BIPOLAR 24F LRG (ELECTROSURGICAL) ×3 IMPLANT
MANIFOLD NEPTUNE II (INSTRUMENTS) ×3 IMPLANT
PACK CYSTO (CUSTOM PROCEDURE TRAY) ×3 IMPLANT
SET HIGH PRES BAL DIL (LABEL)
SYR 30ML LL (SYRINGE) IMPLANT
SYRINGE IRR TOOMEY STRL 70CC (SYRINGE) IMPLANT
TUBE CONNECTING 12X1/4 (SUCTIONS) IMPLANT

## 2015-10-03 NOTE — Anesthesia Procedure Notes (Signed)
Procedure Name: LMA Insertion Date/Time: 10/03/2015 11:59 AM Performed by: Denna Haggard D Pre-anesthesia Checklist: Patient identified, Emergency Drugs available, Suction available and Patient being monitored Patient Re-evaluated:Patient Re-evaluated prior to inductionOxygen Delivery Method: Circle system utilized Preoxygenation: Pre-oxygenation with 100% oxygen Intubation Type: IV induction Ventilation: Mask ventilation without difficulty LMA: LMA inserted LMA Size: 4.0 Number of attempts: 1 Airway Equipment and Method: Bite block Placement Confirmation: positive ETCO2 Tube secured with: Tape Dental Injury: Teeth and Oropharynx as per pre-operative assessment

## 2015-10-03 NOTE — Anesthesia Postprocedure Evaluation (Signed)
Anesthesia Post Note  Patient: Kramer Dejesus  Procedure(s) Performed: Procedure(s) (LRB): GREEN LIGHT LASER,  TURP - STAGED(TRANSURETHRAL RESECTION OF PROSTATE (N/A) CYSTOSCOPY WITH RETROGRADE PYELOGRAM (Bilateral)  Patient location during evaluation: PACU Anesthesia Type: General Level of consciousness: awake and alert Pain management: pain level controlled Vital Signs Assessment: post-procedure vital signs reviewed and stable Respiratory status: spontaneous breathing, nonlabored ventilation, respiratory function stable and patient connected to nasal cannula oxygen Cardiovascular status: blood pressure returned to baseline and stable Postop Assessment: no signs of nausea or vomiting Anesthetic complications: no    Last Vitals:  Vitals:   10/03/15 1430 10/03/15 1445  BP: (!) 148/65 (!) 162/73  Pulse: 84 64  Resp: 14 17  Temp:      Last Pain:  Vitals:   10/03/15 1445  TempSrc:   PainSc: 5                  Zenaida Deed

## 2015-10-03 NOTE — Brief Op Note (Signed)
10/03/2015  2:10 PM  PATIENT:  Stephen Maldonado  60 y.o. male  PRE-OPERATIVE DIAGNOSIS:  BHP retention and hydronephosis  POST-OPERATIVE DIAGNOSIS:  BHP retention and hydronephosis  PROCEDURE:  Procedure(s): GREEN LIGHT LASER,  TURP - STAGED(TRANSURETHRAL RESECTION OF PROSTATE (N/A) CYSTOSCOPY WITH RETROGRADE PYELOGRAM (Bilateral)  SURGEON:  Surgeon(s) and Role:    * [name], MD - Primary  PHYSICIAN ASSISTANT:   ASSISTANTS: none   ANESTHESIA:   general  EBL:  Total I/O In: 1000 [I.V.:1000] Out: 180 [Urine:150; Blood:30]  BLOOD ADMINISTERED:none  DRAINS: Urinary Catheter (Foley) 18 Fr coude with 30 cc balloon   LOCAL MEDICATIONS USED:  B&O supp  SPECIMEN:  Source of Specimen:  TURP chips  DISPOSITION OF SPECIMEN:  PATHOLOGY  COUNTS:  YES  TOURNIQUET:  * No tourniquets in log *  DICTATION: .Dragon Dictation  PLAN OF CARE: Discharge to home after PACU  PATIENT DISPOSITION:  PACU - hemodynamically stable. Urine clear.    Delay start of Pharmacological VTE agent (>24hrs) due to surgical blood loss or risk of bleeding: not applicable

## 2015-10-03 NOTE — Interval H&P Note (Signed)
History and Physical Interval Note:  10/03/2015 11:42 AM  Stephen Maldonado  has presented today for surgery, with the diagnosis of BHP retention and hydronephosis  The various methods of treatment have been discussed with the patient and family. After consideration of risks, benefits and other options for treatment, the patient has consented to  Procedure(s): GREEN LIGHT LASER TURP (TRANSURETHRAL RESECTION OF PROSTATE (N/A) CYSTOSCOPY WITH RETROGRADE PYELOGRAM (N/A) as a surgical intervention .  The patient's history has been reviewed, patient examined, no change in status, stable for surgery.  I have reviewed the patient's chart and labs.  Questions were answered to the patient's satisfaction.  He had fever, left flank pain and gross hematuria after the last cath change/cysto when he called 8/20. All this resolved and he's been well the past 9 days. He did start Bactrim yesterday (took two doses and one this AM) and will get Ancef today.    [name]

## 2015-10-03 NOTE — OR Nursing (Signed)
Patient arrived into OR 1 with foley catheter removed per DR Junious Silk Order 150 mls cloudy urine discarded by Judd Gaudier RN.

## 2015-10-03 NOTE — Discharge Instructions (Signed)
Foley Catheter Care, Adult A Foley catheter is a soft, flexible tube. This tube is placed into your bladder to drain pee (urine). If you go home with this catheter in place, follow the instructions below. TAKING CARE OF THE CATHETER 1. Wash your hands with soap and water. 2. Put soap and water on a clean washcloth.  Clean the skin where the tube goes into your body.  Clean away from the tube site.  Never wipe toward the tube.  Clean the area using a circular motion.  Remove all the soap. Pat the area dry with a clean towel. For males, reposition the skin that covers the end of the penis (foreskin). 3. Attach the tube to your leg with tape or a leg strap. Do not stretch the tube tight. If you are using tape, remove any stickiness left behind by past tape you used. 4. Keep the drainage bag below your hips. Keep it off the floor. 5. Check your tube during the day. Make sure it is working and draining. Make sure the tube does not curl, twist, or bend. 6. Do not pull on the tube or try to take it out. TAKING CARE OF THE DRAINAGE BAGS You will have a large overnight drainage bag and a small leg bag. You may wear the overnight bag any time. Never wear the small bag at night. Follow the directions below. Emptying the Drainage Bag Empty your drainage bag when it is  - full or at least 2-3 times a day. 1. Wash your hands with soap and water. 2. Keep the drainage bag below your hips. 3. Hold the dirty bag over the toilet or clean container. 4. Open the pour spout at the bottom of the bag. Empty the pee into the toilet or container. Do not let the pour spout touch anything. 5. Clean the pour spout with a gauze pad or cotton ball that has rubbing alcohol on it. 6. Close the pour spout. 7. Attach the bag to your leg with tape or a leg strap. 8. Wash your hands well. Changing the Drainage Bag Change your bag once a month or sooner if it starts to smell or look dirty.  1. Wash your hands with soap  and water. 2. Pinch the rubber tube so that pee does not spill out. 3. Disconnect the catheter tube from the drainage tube at the connection valve. Do not let the tubes touch anything. 4. Clean the end of the catheter tube with an alcohol wipe. Clean the end of a the drainage tube with a different alcohol wipe. 5. Connect the catheter tube to the drainage tube of the clean drainage bag. 6. Attach the new bag to the leg with tape or a leg strap. Avoid attaching the new bag too tightly. 7. Wash your hands well. Cleaning the Drainage Bag 1. Wash your hands with soap and water. 2. Wash the bag in warm, soapy water. 3. Rinse the bag with warm water. 4. Fill the bag with a mixture of white vinegar and water (1 cup vinegar to 1 quart warm water [.2 liter vinegar to 1 liter warm water]). Close the bag and soak it for 30 minutes in the solution. 5. Rinse the bag with warm water. 6. Hang the bag to dry with the pour spout open and hanging downward. 7. Store the clean bag (once it is dry) in a clean plastic bag. 8. Wash your hands well. PREVENT INFECTION  Wash your hands before and after touching your tube.  Take showers every day. Wash the skin where the tube enters your body. Do not take baths. Replace wet leg straps with dry ones, if this applies.  Do not use powders, sprays, or lotions on the genital area. Only use creams, lotions, or ointments as told by your doctor.  For females, wipe from front to back after going to the bathroom.  Drink enough fluids to keep your pee clear or pale yellow unless you are told not to have too much fluid (fluid restriction).  Do not let the drainage bag or tubing touch or lie on the floor.  Wear cotton underwear to keep the area dry. GET HELP IF:  Your pee is cloudy or smells unusually bad.  Your tube becomes clogged.  You are not draining pee into the bag or your bladder feels full.  Your tube starts to leak. GET HELP RIGHT AWAY IF:  You have  pain, puffiness (swelling), redness, or yellowish-white fluid (pus) where the tube enters the body.  You have pain in the belly (abdomen), legs, lower back, or bladder.  You have a fever.  You see blood fill the tube, or your pee is pink or red.  You feel sick to your stomach (nauseous), throw up (vomit), or have chills.  Your tube gets pulled out. MAKE SURE YOU:   Understand these instructions.  Will watch your condition.  Will get help right away if you are not doing well or get worse.   This information is not intended to replace advice given to you by your health care provider. Make sure you discuss any questions you have with your health care provider.   Transurethral Resection and Vaporization of the Prostate, Care After Refer to this sheet in the next few weeks. These instructions provide you with information on caring for yourself after your procedure. Your caregiver also may give you specific instructions. Your treatment has been planned according to current medical practices, but complications sometimes occur. Call your caregiver if you have any problems or questions after your procedure. HOME CARE INSTRUCTIONS  Recovery can take 4-6 weeks. Avoid alcohol, caffeinated drinks, and spicy foods for 2 weeks after your procedure. Drink enough fluids to keep your urine clear or pale yellow. Urinate as soon as you feel the urge to do so. Do not try to hold your urine for long periods of time. During recovery you may experience pain caused by bladder spasms, which result in a very intense urge to urinate. Take all medicines as directed by your caregiver, including medicines for pain. Try to limit the amount of pain medicines you take because it can cause constipation. If you do become constipated, do not strain to move your bowels. Straining can increase bleeding. Constipation can be minimized by increasing the amount fluids and fiber in your diet. Your caregiver also may prescribe a stool  softener. Do not lift heavy objects (more than 5 lb [2.25 kg]) or perform exercises that cause you to strain for at least 1 month after your procedure. When sitting, you may want to sit in a soft chair or use a cushion. For the first 10 days after your procedure, avoid the following activities:  Running.  Strenuous work.  Long walks.  Riding in a car for extended periods.  Sex. SEEK MEDICAL CARE IF:  You have difficulty urinating.  You have blood in your urine that does not go away after you rest or increase your fluid intake.  You have swelling in your penis or  scrotum. SEEK IMMEDIATE MEDICAL CARE IF:   You are suddenly unable to urinate.  You notice blood clots in your urine.  You have chills.  You have a fever.  You have pain in your back or lower abdomen.  You have pain or swelling in your legs. MAKE SURE YOU:   Understand these instructions.  Will watch your condition.  Will get help right away if you are not doing well or get worse.   This information is not intended to replace advice given to you by your health care provider. Make sure you discuss any questions you have with your health care provider.   Document Released: 01/21/2005 Document Revised: 02/11/2014 Document Reviewed: 03/01/2011 Elsevier Interactive Patient Education 2016 [facility] Instructions  Activity: Get plenty of rest for the remainder of the day. A responsible adult should stay with you for 24 hours following the procedure.  For the next 24 hours, DO NOT: -Drive a car -Paediatric nurse -Drink alcoholic beverages -Take any medication unless instructed by your physician -Make any legal decisions or sign important papers.  Meals: Start with liquid foods such as gelatin or soup. Progress to regular foods as tolerated. Avoid greasy, spicy, heavy foods. If nausea and/or vomiting occur, drink only clear liquids until the nausea and/or vomiting subsides. Call  your physician if vomiting continues.  Special Instructions/Symptoms: Your throat may feel dry or sore from the anesthesia or the breathing tube placed in your throat during surgery. If this causes discomfort, gargle with warm salt water. The discomfort should disappear within 24 hours.  If you had a scopolamine patch placed behind your ear for the management of post- operative nausea and/or vomiting:  1. The medication in the patch is effective for 72 hours, after which it should be removed.  Wrap patch in a tissue and discard in the trash. Wash hands thoroughly with soap and water. 2. You may remove the patch earlier than 72 hours if you experience unpleasant side effects which may include dry mouth, dizziness or visual disturbances. 3. Avoid touching the patch. Wash your hands with soap and water after contact with the patch.

## 2015-10-03 NOTE — Op Note (Signed)
Preoperative diagnosis: BPH, urinary retention, gross hematuria, bilateral hydronephrosis Postoperative diagnosis: BPH, urinary retention, bilateral hydronephrosis, vesicoureteral reflux  Procedure: Cystoscopy with bilateral retrograde pyelogram, greenlight photo vaporization of the prostate, transurethral resection of the prostate-staged  Surgeon: Junious Silk  Anesthesia: Gen.  Indications for procedure: 60 year old with above diagnoses thought to be due to BPH and bladder outlet obstruction. He was brought for the above procedures.  Findings: On exam under anesthesia the penis was circumcised without mass or lesion. The testicles were descended bilaterally and palpably normal. On digital rectal exam the prostate was about 50 g, smooth without hard area or nodule.  On cystoscopy there were bilateral obstructing lateral prostatic lobes with an elongated prostatic urethra and elevated bladder base. The bladder itself was trabeculated with some mild erythema from the Foley. The ureteral orifices were well away from the prostate and there was brisk, clear efflux. There were no stones or foreign bodies in the bladder.  Right retrograde pyelogram-this outlined a single ureter single collecting system unit was some residual tortuosity in the mid and proximal ureter with mild hydro-nephrosis, dilation of the renal pelvis and collecting system and blunting of the calyces. This appeared improved compared to the prior ultrasound and stable compared to the prior CT. There was brisk efflux and drainage of contrast slower on the right compared to the left.  Left retrograde pyelogram-this outlined a single ureter single collecting system unit and there was some residual tortuosity in the mid and proximal ureter with mild hydronephrosis, dilation of the renal pelvis and collecting system and blunting of the calyces - less compared to the right. This appeared improved compared to the prior ultrasound and stable  compared to the prior CT. There was brisk efflux and drainage of contrast quicker on the left compared to the right.   Description of procedure: After consent was obtained patient brought to the operating room. After adequate anesthesia his placed in lithotomy position and prepped and draped in the usual sterile fashion. A timeout was performed to confirm the patient and procedure. An exam under anesthesia was performed. The cystoscope was passed per urethra and the prostate and bladder inspected. The right ureteral orifice was cannulated with a 6 [place] open-ended catheter and retrograde injection of contrast was performed. Similarly a left retrograde was performed. He was given 10 mg of IV Lasix and there was brisk efflux from both ureteral orifices. The ureteral orifices appeared gaping and widely patent. I suspect much of the hydronephrosis was from retention and reflux.  The laser fiber was then deployed and at 10 W summated incision at 6:00 through the prostatic urethra down to the bladder neck. Having found the proper depth this incision was brought down in the typical hockey-stick fashion bilaterally to the apices. I continually checked the location of the veru as well as the bladder neck in the ureteral orifices. As the bladder filled the ureteral orifices were well away from the prostate and bladder neck. The power was increased to 100 and then 120 W and I was able to vaporize the right lobe going from anterior to posterior bladder neck down to the veru. I then began on the left side and got about 50% of this done but there was some small arterial bleeders that I could not control with the greenlight. I increased the coagulation energy on the greenlight to 40 to no avail and I also decreased the vaporization 40 but still could not get these vessels to coagulate. Visualization was poor and I felt  it was safer to deploy the bipolar resectoscope and complete the left side and finish off the procedure.  Therefore the laser scope was removed the resectoscope continuous-flow sheath was placed with the visual obturator and then the handle. With the loop the bleeding vessels which were typical of a large prostate were quickly dealt with and hemostasis restored. Having completed the resection on the left side from bladder neck down to the area anterior to posterior, I cleaned off some of the floor and also some of the right side. I then resected some tissue at the apex but I did leave some tissue here. This created an excellent channel and hemostasis was excellent at low-pressure. All the chips were evacuated. The bladder and ureteral orifices were inspected again. Again there was excellent efflux from the ureteral orifices, I did not feel he needed stents. The bladder was filled and the scope removed. An 42 French coud catheter was placed, the balloon inflated and it was seated at the bladder neck. Urine was clear.  The patient was awakened and taken to recovery room in stable condition.  Complications: None  Blood loss: 30 mL  Drains: 76 French coud catheter with 30 mL balloon  Specimens to pathology: TURP chips

## 2015-10-03 NOTE — Anesthesia Preprocedure Evaluation (Addendum)
Anesthesia Evaluation  Patient identified by MRN, date of birth, ID band Patient awake    Reviewed: Allergy & Precautions, NPO status , Patient's Chart, lab work & pertinent test results  History of Anesthesia Complications Negative for: history of anesthetic complications  Airway Mallampati: III  TM Distance: >3 FB Neck ROM: Full    Dental  (+) Dental Advisory Given   Pulmonary neg pulmonary ROS,    breath sounds clear to auscultation       Cardiovascular hypertension, Pt. on medications (-) angina Rhythm:Regular Rate:Normal     Neuro/Psych negative neurological ROS     GI/Hepatic Neg liver ROS, GERD  Medicated and Controlled,  Endo/Other  Morbid obesity  Renal/GU hydronephrosis     Musculoskeletal  (+) Arthritis , Osteoarthritis,    Abdominal (+) + obese,   Peds  Hematology negative hematology ROS (+)   Anesthesia Other Findings   Reproductive/Obstetrics                            Anesthesia Physical Anesthesia Plan  ASA: II  Anesthesia Plan: General   Post-op Pain Management:    Induction: Intravenous  Airway Management Planned: LMA  Additional Equipment:   Intra-op Plan:   Post-operative Plan:   Informed Consent: I have reviewed the patients History and Physical, chart, labs and discussed the procedure including the risks, benefits and alternatives for the proposed anesthesia with the patient or authorized representative who has indicated his/her understanding and acceptance.   Dental advisory given  Plan Discussed with: CRNA and Surgeon  Anesthesia Plan Comments: (Plan routine monitors, GA- LMA OK)        Anesthesia Quick Evaluation

## 2015-10-03 NOTE — Transfer of Care (Signed)
Immediate Anesthesia Transfer of Care Note  Patient: Stephen Maldonado  Procedure(s) Performed: Procedure(s) (LRB): GREEN LIGHT LASER,  TURP - STAGED(TRANSURETHRAL RESECTION OF PROSTATE (N/A) CYSTOSCOPY WITH RETROGRADE PYELOGRAM (Bilateral)  Patient Location: PACU  Anesthesia Type: General  Level of Consciousness: awake, oriented, sedated and patient cooperative  Airway & Oxygen Therapy: Patient Spontanous Breathing and Patient connected to face mask oxygen  Post-op Assessment: Report given to PACU RN and Post -op Vital signs reviewed and stable  Post vital signs: Reviewed and stable  Complications: No apparent anesthesia complications  Last Vitals:  Vitals:   10/03/15 0953 10/03/15 1415  BP: (!) 154/73 (!) (P) 153/52  Pulse: 72   Resp: 18   Temp: 36.9 C (P) 36.5 C

## 2015-10-04 ENCOUNTER — Encounter (HOSPITAL_BASED_OUTPATIENT_CLINIC_OR_DEPARTMENT_OTHER): Payer: Self-pay | Admitting: Urology

## 2015-10-11 DIAGNOSIS — R338 Other retention of urine: Secondary | ICD-10-CM | POA: Diagnosis not present

## 2015-10-19 ENCOUNTER — Other Ambulatory Visit: Payer: Self-pay | Admitting: Internal Medicine

## 2015-10-19 ENCOUNTER — Ambulatory Visit
Admission: RE | Admit: 2015-10-19 | Discharge: 2015-10-19 | Disposition: A | Discharge status: Home / Self Care | Payer: BLUE CROSS/BLUE SHIELD | Source: Ambulatory Visit | Attending: Internal Medicine | Admitting: Internal Medicine

## 2015-10-19 DIAGNOSIS — R509 Fever, unspecified: Secondary | ICD-10-CM | POA: Diagnosis not present

## 2015-10-19 DIAGNOSIS — R0609 Other forms of dyspnea: Secondary | ICD-10-CM | POA: Diagnosis not present

## 2015-10-19 DIAGNOSIS — R05 Cough: Secondary | ICD-10-CM | POA: Diagnosis not present

## 2015-10-19 DIAGNOSIS — R0601 Orthopnea: Secondary | ICD-10-CM | POA: Diagnosis not present

## 2015-10-19 DIAGNOSIS — R8290 Unspecified abnormal findings in urine: Secondary | ICD-10-CM | POA: Diagnosis not present

## 2015-10-19 DIAGNOSIS — J9801 Acute bronchospasm: Secondary | ICD-10-CM

## 2015-11-13 DIAGNOSIS — R338 Other retention of urine: Secondary | ICD-10-CM | POA: Diagnosis not present

## 2015-11-13 DIAGNOSIS — N401 Enlarged prostate with lower urinary tract symptoms: Secondary | ICD-10-CM | POA: Diagnosis not present

## 2015-11-13 NOTE — Progress Notes (Signed)
Surgery on 12/01/15.  Need orders in [facility].

## 2015-11-13 NOTE — Progress Notes (Signed)
Surgery on 12/01/15.  Need orders in [facility]

## 2015-11-14 ENCOUNTER — Other Ambulatory Visit: Payer: Self-pay | Admitting: Orthopaedic Surgery

## 2015-11-21 DIAGNOSIS — N19 Unspecified kidney failure: Secondary | ICD-10-CM | POA: Diagnosis not present

## 2015-11-27 ENCOUNTER — Other Ambulatory Visit (HOSPITAL_COMMUNITY): Payer: Self-pay | Admitting: *Deleted

## 2015-11-27 NOTE — Patient Instructions (Signed)
Stephen Maldonado  11/27/2015   Your procedure is scheduled on: 12-01-15  Report to [facility] Main  Entrance take [facility]  elevators to 3rd floor to  [place] at 800 AM.  Call this number if you have problems the morning of surgery [phone]   Remember: ONLY 1 PERSON MAY GO WITH YOU TO SHORT STAY TO GET  READY MORNING OF [place].  Do not eat food or drink liquids :After Midnight.     Take these medicines the morning of surgery with A SIP OF WATER: DILTIAZEM (CARDIZEM CD), TAMSULOSIN (FLOMAX)              You may not have any metal on your body including hair pins and              piercings  Do not wear jewelry, make-up, lotions, powders or perfumes, deodorant             Do not wear nail polish.  Do not shave  48 hours prior to surgery.              Men may shave face and neck.   Do not bring valuables to the hospital. [place].  Contacts, dentures or bridgework may not be worn into surgery.  Leave suitcase in the car. After surgery it may be brought to your room.                  Please read over the following fact sheets you were given: _____________________________________________________________________             [facility] - Preparing for Surgery Before surgery, you can play an important role.  Because skin is not sterile, your skin needs to be as free of germs as possible.  You can reduce the number of germs on your skin by washing with CHG (chlorahexidine gluconate) soap before surgery.  CHG is an antiseptic cleaner which kills germs and bonds with the skin to continue killing germs even after washing. Please DO NOT use if you have an allergy to CHG or antibacterial soaps.  If your skin becomes reddened/irritated stop using the CHG and inform your nurse when you arrive at Short Stay. Do not shave (including legs and underarms) for at least 48 hours prior to the first CHG shower.  You may  shave your face/neck. Please follow these instructions carefully:  1.  Shower with CHG Soap the night before surgery and the  morning of Surgery.  2.  If you choose to wash your hair, wash your hair first as usual with your  normal  shampoo.  3.  After you shampoo, rinse your hair and body thoroughly to remove the  shampoo.                           4.  Use CHG as you would any other liquid soap.  You can apply chg directly  to the skin and wash                       Gently with a scrungie or clean washcloth.  5.  Apply the CHG Soap to your body ONLY FROM THE NECK DOWN.   Do not use on face/  open                           Wound or open sores. Avoid contact with eyes, ears mouth and genitals (private parts).                       Wash face,  Genitals (private parts) with your normal soap.             6.  Wash thoroughly, paying special attention to the area where your surgery  will be performed.  7.  Thoroughly rinse your body with warm water from the neck down.  8.  DO NOT shower/wash with your normal soap after using and rinsing off  the CHG Soap.                9.  Pat yourself dry with a clean towel.            10.  Wear clean pajamas.            11.  Place clean sheets on your bed the night of your first shower and do not  sleep with pets. Day of Surgery : Do not apply any lotions/deodorants the morning of surgery.  Please wear clean clothes to the hospital/surgery center.  FAILURE TO FOLLOW THESE INSTRUCTIONS MAY RESULT IN THE CANCELLATION OF YOUR SURGERY PATIENT SIGNATURE_________________________________  NURSE SIGNATURE__________________________________  ________________________________________________________________________

## 2015-11-27 NOTE — Progress Notes (Signed)
CHEST XRAY 10-19-15 EPIC EKG 10-03-15 [facility]

## 2015-11-28 ENCOUNTER — Encounter (HOSPITAL_COMMUNITY): Payer: Self-pay

## 2015-11-28 ENCOUNTER — Encounter (HOSPITAL_COMMUNITY)
Admission: RE | Admit: 2015-11-28 | Discharge: 2015-11-28 | Disposition: A | Discharge status: Home / Self Care | Payer: BLUE CROSS/BLUE SHIELD | Source: Ambulatory Visit | Attending: Orthopaedic Surgery | Admitting: Orthopaedic Surgery

## 2015-11-28 DIAGNOSIS — M1711 Unilateral primary osteoarthritis, right knee: Secondary | ICD-10-CM | POA: Diagnosis not present

## 2015-11-28 DIAGNOSIS — Z471 Aftercare following joint replacement surgery: Secondary | ICD-10-CM | POA: Diagnosis not present

## 2015-11-28 DIAGNOSIS — K219 Gastro-esophageal reflux disease without esophagitis: Secondary | ICD-10-CM | POA: Diagnosis not present

## 2015-11-28 DIAGNOSIS — Z6838 Body mass index (BMI) 38.0-38.9, adult: Secondary | ICD-10-CM | POA: Diagnosis not present

## 2015-11-28 DIAGNOSIS — E669 Obesity, unspecified: Secondary | ICD-10-CM | POA: Diagnosis not present

## 2015-11-28 DIAGNOSIS — R269 Unspecified abnormalities of gait and mobility: Secondary | ICD-10-CM | POA: Diagnosis not present

## 2015-11-28 DIAGNOSIS — Z96641 Presence of right artificial hip joint: Secondary | ICD-10-CM | POA: Diagnosis not present

## 2015-11-28 DIAGNOSIS — I10 Essential (primary) hypertension: Secondary | ICD-10-CM | POA: Diagnosis not present

## 2015-11-28 DIAGNOSIS — Z87891 Personal history of nicotine dependence: Secondary | ICD-10-CM | POA: Diagnosis not present

## 2015-11-28 DIAGNOSIS — N400 Enlarged prostate without lower urinary tract symptoms: Secondary | ICD-10-CM | POA: Diagnosis not present

## 2015-11-28 DIAGNOSIS — Z01818 Encounter for other preprocedural examination: Secondary | ICD-10-CM | POA: Insufficient documentation

## 2015-11-28 DIAGNOSIS — Z86718 Personal history of other venous thrombosis and embolism: Secondary | ICD-10-CM | POA: Diagnosis not present

## 2015-11-28 DIAGNOSIS — M25561 Pain in right knee: Secondary | ICD-10-CM | POA: Diagnosis not present

## 2015-11-28 HISTORY — DX: Acute embolism and thrombosis of unspecified deep veins of unspecified lower extremity: I82.409

## 2015-11-28 LAB — CBC
HEMATOCRIT: 34.9 % — AB (ref 39.0–52.0)
HEMOGLOBIN: 11.3 g/dL — AB (ref 13.0–17.0)
MCH: 28.8 pg (ref 26.0–34.0)
MCHC: 32.4 g/dL (ref 30.0–36.0)
MCV: 89 fL (ref 78.0–100.0)
Platelets: 200 10*3/uL (ref 150–400)
RBC: 3.92 MIL/uL — AB (ref 4.22–5.81)
RDW: 16.2 % — ABNORMAL HIGH (ref 11.5–15.5)
WBC: 7.9 10*3/uL (ref 4.0–10.5)

## 2015-11-28 LAB — SURGICAL PCR SCREEN
MRSA, PCR: NEGATIVE
Staphylococcus aureus: NEGATIVE

## 2015-11-28 LAB — BASIC METABOLIC PANEL
ANION GAP: 8 (ref 5–15)
BUN: 26 mg/dL — ABNORMAL HIGH (ref 6–20)
CHLORIDE: 110 mmol/L (ref 101–111)
CO2: 24 mmol/L (ref 22–32)
Calcium: 8.9 mg/dL (ref 8.9–10.3)
Creatinine, Ser: 2.39 mg/dL — ABNORMAL HIGH (ref 0.61–1.24)
GFR calc non Af Amer: 28 mL/min — ABNORMAL LOW (ref >60)
GFR, EST AFRICAN AMERICAN: 32 mL/min — AB (ref >60)
Glucose, Bld: 120 mg/dL — ABNORMAL HIGH (ref 65–99)
POTASSIUM: 3.8 mmol/L (ref 3.5–5.1)
Sodium: 142 mmol/L (ref 135–145)

## 2015-11-28 NOTE — Progress Notes (Addendum)
BMET ROUTED TO DR CHRISTOPHER Ninfa Linden INBASKET BY EPIC AND LEFT MESSAGE FOR [facility] BILLINGS ABOUT ELEVATED CREATININE

## 2015-11-30 MED ORDER — CEFAZOLIN SODIUM 10 G IJ SOLR
3.0000 g | INTRAMUSCULAR | Status: AC
  Administered 2015-12-01: 3 g via INTRAVENOUS
  Filled 2015-11-30: qty 3

## 2015-12-01 ENCOUNTER — Encounter (HOSPITAL_COMMUNITY): Payer: Self-pay | Admitting: *Deleted

## 2015-12-01 ENCOUNTER — Encounter (HOSPITAL_COMMUNITY)
Admission: RE | Disposition: A | Discharge status: Home Health | Payer: Self-pay | Source: Ambulatory Visit | Attending: Orthopaedic Surgery

## 2015-12-01 ENCOUNTER — Inpatient Hospital Stay (HOSPITAL_COMMUNITY)
Admission: RE | Admit: 2015-12-01 | Discharge: 2015-12-02 | DRG: 470 | Disposition: A | Discharge status: Home Health | Payer: BLUE CROSS/BLUE SHIELD | Source: Ambulatory Visit | Attending: Orthopaedic Surgery | Admitting: Orthopaedic Surgery

## 2015-12-01 ENCOUNTER — Inpatient Hospital Stay (HOSPITAL_COMMUNITY): Payer: BLUE CROSS/BLUE SHIELD | Admitting: Certified Registered Nurse Anesthetist

## 2015-12-01 ENCOUNTER — Inpatient Hospital Stay (HOSPITAL_COMMUNITY): Payer: BLUE CROSS/BLUE SHIELD

## 2015-12-01 DIAGNOSIS — I10 Essential (primary) hypertension: Secondary | ICD-10-CM | POA: Diagnosis present

## 2015-12-01 DIAGNOSIS — Z96651 Presence of right artificial knee joint: Secondary | ICD-10-CM

## 2015-12-01 DIAGNOSIS — M25561 Pain in right knee: Secondary | ICD-10-CM | POA: Diagnosis present

## 2015-12-01 DIAGNOSIS — K219 Gastro-esophageal reflux disease without esophagitis: Secondary | ICD-10-CM | POA: Diagnosis present

## 2015-12-01 DIAGNOSIS — Z6838 Body mass index (BMI) 38.0-38.9, adult: Secondary | ICD-10-CM

## 2015-12-01 DIAGNOSIS — M1711 Unilateral primary osteoarthritis, right knee: Secondary | ICD-10-CM | POA: Diagnosis not present

## 2015-12-01 DIAGNOSIS — Z87891 Personal history of nicotine dependence: Secondary | ICD-10-CM

## 2015-12-01 DIAGNOSIS — Z86718 Personal history of other venous thrombosis and embolism: Secondary | ICD-10-CM

## 2015-12-01 DIAGNOSIS — N400 Enlarged prostate without lower urinary tract symptoms: Secondary | ICD-10-CM | POA: Diagnosis present

## 2015-12-01 DIAGNOSIS — R269 Unspecified abnormalities of gait and mobility: Secondary | ICD-10-CM | POA: Diagnosis not present

## 2015-12-01 DIAGNOSIS — Z471 Aftercare following joint replacement surgery: Secondary | ICD-10-CM | POA: Diagnosis not present

## 2015-12-01 DIAGNOSIS — Z96641 Presence of right artificial hip joint: Secondary | ICD-10-CM | POA: Diagnosis not present

## 2015-12-01 DIAGNOSIS — E669 Obesity, unspecified: Secondary | ICD-10-CM | POA: Diagnosis present

## 2015-12-01 HISTORY — PX: TOTAL KNEE ARTHROPLASTY: SHX125

## 2015-12-01 LAB — BASIC METABOLIC PANEL
Anion gap: 6 (ref 5–15)
BUN: 25 mg/dL — AB (ref 6–20)
CHLORIDE: 107 mmol/L (ref 101–111)
CO2: 25 mmol/L (ref 22–32)
CREATININE: 2.38 mg/dL — AB (ref 0.61–1.24)
Calcium: 9.2 mg/dL (ref 8.9–10.3)
GFR calc Af Amer: 32 mL/min — ABNORMAL LOW (ref >60)
GFR calc non Af Amer: 28 mL/min — ABNORMAL LOW (ref >60)
Glucose, Bld: 97 mg/dL (ref 65–99)
Potassium: 4 mmol/L (ref 3.5–5.1)
Sodium: 138 mmol/L (ref 135–145)

## 2015-12-01 SURGERY — ARTHROPLASTY, KNEE, TOTAL
Anesthesia: Spinal | Site: Knee | Laterality: Right

## 2015-12-01 MED ORDER — METOCLOPRAMIDE HCL 5 MG PO TABS
5.0000 mg | ORAL_TABLET | Freq: Three times a day (TID) | ORAL | Status: DC | PRN
Start: 2015-12-01 — End: 2015-12-02

## 2015-12-01 MED ORDER — FENTANYL CITRATE (PF) 100 MCG/2ML IJ SOLN
INTRAMUSCULAR | Status: AC
  Filled 2015-12-01: qty 2

## 2015-12-01 MED ORDER — ONDANSETRON HCL 4 MG/2ML IJ SOLN: 4.0000 mg | Freq: Four times a day (QID) | INTRAMUSCULAR | Status: DC | PRN

## 2015-12-01 MED ORDER — COQ10 100 MG PO CAPS: 100.0000 mg | ORAL_CAPSULE | Freq: Every day | ORAL | Status: DC

## 2015-12-01 MED ORDER — HYDROMORPHONE HCL 1 MG/ML IJ SOLN
INTRAMUSCULAR | Status: AC
  Filled 2015-12-01: qty 1

## 2015-12-01 MED ORDER — VITAMIN D3 25 MCG (1000 UNIT) PO TABS
1000.0000 [IU] | ORAL_TABLET | Freq: Every day | ORAL | Status: DC
  Administered 2015-12-01 – 2015-12-02 (×2): 1000 [IU] via ORAL
  Filled 2015-12-01 (×2): qty 1

## 2015-12-01 MED ORDER — ASPIRIN EC 325 MG PO TBEC
325.0000 mg | DELAYED_RELEASE_TABLET | Freq: Two times a day (BID) | ORAL | Status: DC
  Filled 2015-12-01: qty 1

## 2015-12-01 MED ORDER — TAMSULOSIN HCL 0.4 MG PO CAPS
0.4000 mg | ORAL_CAPSULE | Freq: Every morning | ORAL | Status: DC
  Administered 2015-12-02: 0.4 mg via ORAL
  Filled 2015-12-01: qty 1

## 2015-12-01 MED ORDER — MIDAZOLAM HCL 5 MG/5ML IJ SOLN
INTRAMUSCULAR | Status: DC | PRN
  Administered 2015-12-01: 2 mg via INTRAVENOUS

## 2015-12-01 MED ORDER — PROPOFOL 500 MG/50ML IV EMUL
INTRAVENOUS | Status: DC | PRN
  Administered 2015-12-01: 50 ug/kg/min via INTRAVENOUS

## 2015-12-01 MED ORDER — HYDROMORPHONE HCL 1 MG/ML IJ SOLN
0.2500 mg | INTRAMUSCULAR | Status: DC | PRN
  Administered 2015-12-01 (×4): 0.5 mg via INTRAVENOUS

## 2015-12-01 MED ORDER — LACTATED RINGERS IV SOLN
INTRAVENOUS | Status: DC
  Administered 2015-12-01 (×2): via INTRAVENOUS

## 2015-12-01 MED ORDER — METHOCARBAMOL 1000 MG/10ML IJ SOLN
500.0000 mg | Freq: Four times a day (QID) | INTRAVENOUS | Status: DC | PRN
  Administered 2015-12-01: 500 mg via INTRAVENOUS
  Filled 2015-12-01: qty 550
  Filled 2015-12-01: qty 5

## 2015-12-01 MED ORDER — OXYCODONE HCL 5 MG PO TABS
5.0000 mg | ORAL_TABLET | ORAL | Status: DC | PRN
  Administered 2015-12-01: 5 mg via ORAL
  Administered 2015-12-01: 10 mg via ORAL
  Administered 2015-12-01: 5 mg via ORAL
  Administered 2015-12-01 – 2015-12-02 (×4): 10 mg via ORAL
  Filled 2015-12-01 (×3): qty 2
  Filled 2015-12-01: qty 1
  Filled 2015-12-01: qty 2
  Filled 2015-12-01: qty 1
  Filled 2015-12-01: qty 2

## 2015-12-01 MED ORDER — DILTIAZEM HCL ER COATED BEADS 240 MG PO CP24
240.0000 mg | ORAL_CAPSULE | Freq: Every day | ORAL | Status: DC
  Administered 2015-12-02: 240 mg via ORAL
  Filled 2015-12-01: qty 1

## 2015-12-01 MED ORDER — SODIUM CHLORIDE 0.9 % IR SOLN
Status: DC | PRN
  Administered 2015-12-01: 3000 mL

## 2015-12-01 MED ORDER — HYDROMORPHONE HCL 1 MG/ML IJ SOLN: 1.0000 mg | INTRAMUSCULAR | Status: DC | PRN

## 2015-12-01 MED ORDER — VITAMIN B-12 1000 MCG PO TABS
1000.0000 ug | ORAL_TABLET | Freq: Every day | ORAL | Status: DC
  Administered 2015-12-01 – 2015-12-02 (×2): 1000 ug via ORAL
  Filled 2015-12-01 (×2): qty 1

## 2015-12-01 MED ORDER — PROPOFOL 10 MG/ML IV BOLUS
INTRAVENOUS | Status: AC
  Filled 2015-12-01: qty 60

## 2015-12-01 MED ORDER — PROPOFOL 10 MG/ML IV BOLUS
INTRAVENOUS | Status: AC
  Filled 2015-12-01: qty 20

## 2015-12-01 MED ORDER — POLYETHYLENE GLYCOL 3350 17 G PO PACK: 17.0000 g | PACK | Freq: Every day | ORAL | Status: DC | PRN

## 2015-12-01 MED ORDER — ONDANSETRON HCL 4 MG/2ML IJ SOLN
INTRAMUSCULAR | Status: AC
  Filled 2015-12-01: qty 2

## 2015-12-01 MED ORDER — ONDANSETRON HCL 4 MG/2ML IJ SOLN
INTRAMUSCULAR | Status: DC | PRN
  Administered 2015-12-01: 4 mg via INTRAVENOUS

## 2015-12-01 MED ORDER — FENTANYL CITRATE (PF) 100 MCG/2ML IJ SOLN
INTRAMUSCULAR | Status: AC
  Administered 2015-12-01: 50 ug via INTRAVENOUS
  Filled 2015-12-01: qty 2

## 2015-12-01 MED ORDER — KETOROLAC TROMETHAMINE 15 MG/ML IJ SOLN
7.5000 mg | Freq: Four times a day (QID) | INTRAMUSCULAR | Status: AC
  Administered 2015-12-01: 7.5 mg via INTRAVENOUS
  Administered 2015-12-01: 15 mg via INTRAVENOUS
  Administered 2015-12-02: 7.5 mg via INTRAVENOUS
  Filled 2015-12-01 (×3): qty 1

## 2015-12-01 MED ORDER — LABETALOL HCL 5 MG/ML IV SOLN
INTRAVENOUS | Status: DC | PRN
  Administered 2015-12-01 (×2): 5 mg via INTRAVENOUS

## 2015-12-01 MED ORDER — ONDANSETRON HCL 4 MG/2ML IJ SOLN: 4.0000 mg | Freq: Once | INTRAMUSCULAR | Status: DC | PRN

## 2015-12-01 MED ORDER — MIDAZOLAM HCL 2 MG/2ML IJ SOLN
INTRAMUSCULAR | Status: AC
  Filled 2015-12-01: qty 2

## 2015-12-01 MED ORDER — METHOCARBAMOL 500 MG PO TABS
500.0000 mg | ORAL_TABLET | Freq: Four times a day (QID) | ORAL | Status: DC | PRN
  Administered 2015-12-01 – 2015-12-02 (×2): 500 mg via ORAL
  Filled 2015-12-01 (×2): qty 1

## 2015-12-01 MED ORDER — DOCUSATE SODIUM 100 MG PO CAPS
100.0000 mg | ORAL_CAPSULE | Freq: Two times a day (BID) | ORAL | Status: DC
  Administered 2015-12-01 – 2015-12-02 (×2): 100 mg via ORAL
  Filled 2015-12-01 (×2): qty 1

## 2015-12-01 MED ORDER — BUPIVACAINE IN DEXTROSE 0.75-8.25 % IT SOLN
INTRATHECAL | Status: DC | PRN
  Administered 2015-12-01: 2 mL via INTRATHECAL

## 2015-12-01 MED ORDER — ALUM & MAG HYDROXIDE-SIMETH 200-200-20 MG/5ML PO SUSP: 30.0000 mL | ORAL | Status: DC | PRN

## 2015-12-01 MED ORDER — ACETAMINOPHEN 650 MG RE SUPP: 650.0000 mg | Freq: Four times a day (QID) | RECTAL | Status: DC | PRN

## 2015-12-01 MED ORDER — DEXAMETHASONE SODIUM PHOSPHATE 10 MG/ML IJ SOLN
INTRAMUSCULAR | Status: DC | PRN
  Administered 2015-12-01: 10 mg via INTRAVENOUS

## 2015-12-01 MED ORDER — CEFAZOLIN IN D5W 1 GM/50ML IV SOLN
1.0000 g | Freq: Four times a day (QID) | INTRAVENOUS | Status: AC
  Administered 2015-12-01 (×2): 1 g via INTRAVENOUS
  Filled 2015-12-01 (×2): qty 50

## 2015-12-01 MED ORDER — PHENOL 1.4 % MT LIQD
1.0000 | OROMUCOSAL | Status: DC | PRN
Start: 2015-12-01 — End: 2015-12-02
  Filled 2015-12-01: qty 177

## 2015-12-01 MED ORDER — MENTHOL 3 MG MT LOZG: 1.0000 | LOZENGE | OROMUCOSAL | Status: DC | PRN

## 2015-12-01 MED ORDER — ACETAMINOPHEN 325 MG PO TABS: 650.0000 mg | ORAL_TABLET | Freq: Four times a day (QID) | ORAL | Status: DC | PRN

## 2015-12-01 MED ORDER — ONDANSETRON HCL 4 MG PO TABS: 4.0000 mg | ORAL_TABLET | Freq: Four times a day (QID) | ORAL | Status: DC | PRN

## 2015-12-01 MED ORDER — PROPOFOL 10 MG/ML IV BOLUS
INTRAVENOUS | Status: DC | PRN
  Administered 2015-12-01 (×4): 20 mg via INTRAVENOUS

## 2015-12-01 MED ORDER — FENTANYL CITRATE (PF) 100 MCG/2ML IJ SOLN
25.0000 ug | INTRAMUSCULAR | Status: DC | PRN
  Administered 2015-12-01 (×3): 50 ug via INTRAVENOUS

## 2015-12-01 MED ORDER — SODIUM CHLORIDE 0.9 % IV SOLN
INTRAVENOUS | Status: DC
  Administered 2015-12-01: 13:00:00 via INTRAVENOUS

## 2015-12-01 MED ORDER — METOCLOPRAMIDE HCL 5 MG/ML IJ SOLN: 5.0000 mg | Freq: Three times a day (TID) | INTRAMUSCULAR | Status: DC | PRN

## 2015-12-01 MED ORDER — DIPHENHYDRAMINE HCL 12.5 MG/5ML PO ELIX: 12.5000 mg | ORAL_SOLUTION | ORAL | Status: DC | PRN

## 2015-12-01 MED ORDER — FENTANYL CITRATE (PF) 100 MCG/2ML IJ SOLN
INTRAMUSCULAR | Status: DC | PRN
  Administered 2015-12-01 (×2): 50 ug via INTRAVENOUS

## 2015-12-01 SURGICAL SUPPLY — 50 items
BAG ZIPLOCK 12X15 (MISCELLANEOUS) ×2 IMPLANT
BANDAGE ACE 6X5 VEL STRL LF (GAUZE/BANDAGES/DRESSINGS) ×2 IMPLANT
BANDAGE ELASTIC 6 VELCRO ST LF (GAUZE/BANDAGES/DRESSINGS) ×2 IMPLANT
BENZOIN TINCTURE PRP APPL 2/3 (GAUZE/BANDAGES/DRESSINGS) ×2 IMPLANT
BLADE SAG 13.0X1.37X90 (BLADE) IMPLANT
BLADE SAG 18X100X1.27 (BLADE) ×2 IMPLANT
BOWL SMART MIX CTS (DISPOSABLE) ×2 IMPLANT
CAPT KNEE TOTAL 3 ×2 IMPLANT
CEMENT BONE SIMPLEX SPEEDSET (Cement) ×4 IMPLANT
CLOTH BEACON ORANGE TIMEOUT ST (SAFETY) ×2 IMPLANT
CUFF TOURN SGL QUICK 34 (TOURNIQUET CUFF) ×1
CUFF TRNQT CYL 34X4X40X1 (TOURNIQUET CUFF) ×1 IMPLANT
DRAPE U-SHAPE 47X51 STRL (DRAPES) ×2 IMPLANT
DRSG AQUACEL AG ADV 3.5X10 (GAUZE/BANDAGES/DRESSINGS) ×2 IMPLANT
DRSG PAD ABDOMINAL 8X10 ST (GAUZE/BANDAGES/DRESSINGS) ×2 IMPLANT
DURAPREP 26ML APPLICATOR (WOUND CARE) ×2 IMPLANT
ELECT REM PT RETURN 9FT ADLT (ELECTROSURGICAL) ×2
ELECTRODE REM PT RTRN 9FT ADLT (ELECTROSURGICAL) ×1 IMPLANT
GAUZE SPONGE 4X4 12PLY STRL (GAUZE/BANDAGES/DRESSINGS) ×2 IMPLANT
GAUZE XEROFORM 1X8 LF (GAUZE/BANDAGES/DRESSINGS) IMPLANT
GLOVE BIO SURGEON STRL SZ7.5 (GLOVE) ×2 IMPLANT
GLOVE BIOGEL PI IND STRL 8 (GLOVE) ×2 IMPLANT
GLOVE BIOGEL PI INDICATOR 8 (GLOVE) ×2
GLOVE ECLIPSE 8.0 STRL XLNG CF (GLOVE) ×2 IMPLANT
GOWN STRL REUS W/TWL XL LVL3 (GOWN DISPOSABLE) ×4 IMPLANT
HANDPIECE INTERPULSE COAX TIP (DISPOSABLE) ×1
IMMOBILIZER KNEE 20 (SOFTGOODS) ×2
IMMOBILIZER KNEE 20 THIGH 36 (SOFTGOODS) ×1 IMPLANT
NS IRRIG 1000ML POUR BTL (IV SOLUTION) ×2 IMPLANT
PACK TOTAL KNEE CUSTOM (KITS) ×2 IMPLANT
PAD ABD 8X10 STRL (GAUZE/BANDAGES/DRESSINGS) ×2 IMPLANT
PADDING CAST COTTON 6X4 STRL (CAST SUPPLIES) ×4 IMPLANT
POSITIONER SURGICAL ARM (MISCELLANEOUS) ×2 IMPLANT
SET HNDPC FAN SPRY TIP SCT (DISPOSABLE) ×1 IMPLANT
SET PAD KNEE POSITIONER (MISCELLANEOUS) ×2 IMPLANT
STAPLER VISISTAT 35W (STAPLE) IMPLANT
STRIP CLOSURE SKIN 1/2X4 (GAUZE/BANDAGES/DRESSINGS) ×2 IMPLANT
SUCTION FRAZIER HANDLE 12FR (TUBING) ×1
SUCTION TUBE FRAZIER 12FR DISP (TUBING) ×1 IMPLANT
SUT MNCRL AB 4-0 PS2 18 (SUTURE) IMPLANT
SUT VIC AB 0 CT1 27 (SUTURE) ×1
SUT VIC AB 0 CT1 27XBRD ANTBC (SUTURE) ×1 IMPLANT
SUT VIC AB 1 CT1 27 (SUTURE) ×2
SUT VIC AB 1 CT1 27XBRD ANTBC (SUTURE) ×2 IMPLANT
SUT VIC AB 2-0 CT1 27 (SUTURE) ×2
SUT VIC AB 2-0 CT1 TAPERPNT 27 (SUTURE) ×2 IMPLANT
TRAY FOLEY W/METER SILVER 16FR (SET/KITS/TRAYS/PACK) ×2 IMPLANT
WATER STERILE IRR 1500ML POUR (IV SOLUTION) ×2 IMPLANT
WRAP KNEE MAXI GEL POST OP (GAUZE/BANDAGES/DRESSINGS) ×2 IMPLANT
YANKAUER SUCT BULB TIP 10FT TU (MISCELLANEOUS) ×2 IMPLANT

## 2015-12-01 NOTE — Brief Op Note (Signed)
12/01/2015  10:48 AM  PATIENT:  Stephen Maldonado  60 y.o. male  PRE-OPERATIVE DIAGNOSIS:  Osteoarthritis right knee  POST-OPERATIVE DIAGNOSIS:  Osteoarthritis right knee  PROCEDURE:  Procedure(s): RIGHT TOTAL KNEE ARTHROPLASTY (Right)  SURGEON:  Surgeon(s) and Role:    * [name], MD - Primary  PHYSICIAN ASSISTANT: Benita Stabile, PA-C  ANESTHESIA:   spinal  EBL:  Total I/O In: 1000 [I.V.:1000] Out: 350 [Urine:350]  COUNTS:  YES  TOURNIQUET:   Total Tourniquet Time Documented: Thigh (Right) - 58 minutes Total: Thigh (Right) - 58 minutes   DICTATION: .Other Dictation: Dictation Number [id number]  PLAN OF CARE: Admit to inpatient   PATIENT DISPOSITION:  PACU - hemodynamically stable.   Delay start of Pharmacological VTE agent (>24hrs) due to surgical blood loss or risk of bleeding: no

## 2015-12-01 NOTE — Progress Notes (Signed)
PHARMACIST - PHYSICIAN ORDER COMMUNICATION  CONCERNING: P&T Medication Policy on Herbal Medications  DESCRIPTION:  This patient's order for:  Co-Q-10  has been noted.  This product(s) is classified as an "herbal" or natural product. Due to a lack of definitive safety studies or FDA approval, nonstandard manufacturing practices, plus the potential risk of unknown drug-drug interactions while on inpatient medications, the Pharmacy and Therapeutics Committee does not permit the use of "herbal" or natural products of this type within [facility].   ACTION TAKEN: The pharmacy department is unable to verify this order at this time and has been discontinued.  Please reevaluate patient's clinical condition at discharge and address if the herbal or natural product(s) should be resumed at that time.  Royetta Asal, PharmD, BCPS Pager [phone] 12/01/2015 1:28 PM

## 2015-12-01 NOTE — Evaluation (Signed)
Physical Therapy Evaluation Patient Details Name: Stephen Maldonado MRN: [id number] DOB: 11-Jan-1956 Today's Date: 12/01/2015   History of Present Illness  Pt s/p R TKR and with hx of bilat quad tendon repair  Clinical Impression  Pt s/p R TKR presents with decreased R LE strength/ROM and post op pain limiting functional mobility.  Pt should progress to dc home with family assist and HHPT follow up.   Follow Up Recommendations Home health PT    Equipment Recommendations  Rolling walker with 5" wheels    Recommendations for Other Services OT consult     Precautions / Restrictions Precautions Precautions: Knee;Fall Required Braces or Orthoses: Knee Immobilizer - Right Knee Immobilizer - Right: Discontinue once straight leg raise with < 10 degree lag Restrictions Weight Bearing Restrictions: No Other Position/Activity Restrictions: WBAT      Mobility  Bed Mobility Overal bed mobility: Needs Assistance Bed Mobility: Supine to Sit     Supine to sit: Min assist     General bed mobility comments: cues for sequence and use of L LE to self assist  Transfers Overall transfer level: Needs assistance Equipment used: Rolling walker (2 wheeled) Transfers: Sit to/from Stand Sit to Stand: Min assist         General transfer comment: cues for LE management and use of UEs to self assist  Ambulation/Gait Ambulation/Gait assistance: Min assist;Min guard Ambulation Distance (Feet): 55 Feet Assistive device: Rolling walker (2 wheeled) Gait Pattern/deviations: Step-to pattern;Decreased step length - right;Decreased step length - left;Shuffle;Trunk flexed Gait velocity: decr Gait velocity interpretation: Below normal speed for age/gender General Gait Details: cues for sequence, posture and position from [employer]            Wheelchair Mobility    Modified Rankin (Stroke Patients Only)       Balance                                              Pertinent Vitals/Pain Pain Assessment: 0-10 Pain Score: 5  Pain Location: R knee Pain Descriptors / Indicators: Aching;Sore Pain Intervention(s): Limited activity within patient's tolerance;Monitored during session;Premedicated before session;Ice applied    Home Living Family/patient expects to be discharged to:: Private residence Living Arrangements: Spouse/significant other Available Help at Discharge: Family Type of Home: House Home Access: Stairs to enter Entrance Stairs-Rails: None Technical brewer of Steps: 4 Home Layout: One level Home Equipment: Crutches      Prior Function Level of Independence: Independent               Hand Dominance        Extremity/Trunk Assessment   Upper Extremity Assessment: Overall WFL for tasks assessed           Lower Extremity Assessment: RLE deficits/detail      Cervical / Trunk Assessment: Normal  Communication   Communication: No difficulties  Cognition Arousal/Alertness: Awake/alert Behavior During Therapy: WFL for tasks assessed/performed Overall Cognitive Status: Within Functional Limits for tasks assessed                      General Comments      Exercises Total Joint Exercises Ankle Circles/Pumps: AROM;Both;15 reps;Supine   Assessment/Plan    PT Assessment Patient needs continued PT services  PT Problem List Decreased strength;Decreased range of motion;Decreased activity tolerance;Decreased mobility;Decreased knowledge of use of DME;Pain;Obesity  PT Treatment Interventions DME instruction;Gait training;Stair training;Functional mobility training;Therapeutic activities;Therapeutic exercise;Patient/family education    PT Goals (Current goals can be found in the Care Plan section)  Acute Rehab PT Goals Patient Stated Goal: Regain IND PT Goal Formulation: With patient Time For Goal Achievement: 12/05/15 Potential to Achieve Goals: Good    Frequency 7X/week   Barriers  to discharge        Co-evaluation               End of Session Equipment Utilized During Treatment: Gait belt;Right knee immobilizer Activity Tolerance: Patient tolerated treatment well Patient left: in chair;with call bell/phone within reach;with family/visitor present Nurse Communication: Mobility status         Time: [id number] PT Time Calculation (min) (ACUTE ONLY): 28 min   Charges:   PT Evaluation $PT Eval Low Complexity: 1 Procedure PT Treatments $Gait Training: 8-22 mins   PT G Codes:        Stephen Maldonado 12-26-15, 6:34 PM

## 2015-12-01 NOTE — Transfer of Care (Signed)
Immediate Anesthesia Transfer of Care Note  Patient: Stephen Maldonado  Procedure(s) Performed: Procedure(s): RIGHT TOTAL KNEE ARTHROPLASTY (Right)  Patient Location: PACU  Anesthesia Type:Spinal  Level of Consciousness:  sedated, patient cooperative and responds to stimulation  Airway & Oxygen Therapy:Patient Spontanous Breathing and Patient connected to face mask oxgen  Post-op Assessment:  Report given to PACU RN and Post -op Vital signs reviewed and stable  Post vital signs:  Reviewed and stable  Last Vitals:  Vitals:   12/01/15 0810  BP: (!) 144/64  Pulse: 71  Resp: 18  Temp: [id number] C    Complications: No apparent anesthesia complications

## 2015-12-01 NOTE — Anesthesia Postprocedure Evaluation (Signed)
Anesthesia Post Note  Patient: Stephen Maldonado  Procedure(s) Performed: Procedure(s) (LRB): RIGHT TOTAL KNEE ARTHROPLASTY (Right)  Patient location during evaluation: PACU Anesthesia Type: Spinal Level of consciousness: awake and alert Pain management: pain level controlled Vital Signs Assessment: post-procedure vital signs reviewed and stable Respiratory status: spontaneous breathing, nonlabored ventilation, respiratory function stable and patient connected to nasal cannula oxygen Cardiovascular status: blood pressure returned to baseline and stable Postop Assessment: no signs of nausea or vomiting, spinal receding, no backache and no headache Anesthetic complications: no    Last Vitals:  Vitals:   12/01/15 1145 12/01/15 1200  BP: 132/69 131/67  Pulse: 60 60  Resp: 11 13  Temp:      Last Pain:  Vitals:   12/01/15 1200  TempSrc:   PainSc: Asleep                 Evora Schechter JENNETTE

## 2015-12-01 NOTE — H&P (Signed)
TOTAL KNEE ADMISSION H&P  Patient is being admitted for right total knee arthroplasty.  Subjective:  Chief Complaint:right knee pain.  HPI: Stephen Maldonado, 60 y.o. male, has a history of pain and functional disability in the right knee due to arthritis and has failed non-surgical conservative treatments for greater than 12 weeks to includeNSAID's and/or analgesics, corticosteriod injections, flexibility and strengthening excercises, use of assistive devices, weight reduction as appropriate and activity modification.  Onset of symptoms was gradual, starting 9 years ago with gradually worsening course since that time. The patient noted prior procedures on the knee to include  arthroscopy on the right knee(s).  Patient currently rates pain in the right knee(s) at 10 out of 10 with activity. Patient has night pain, worsening of pain with activity and weight bearing, pain that interferes with activities of daily living, pain with passive range of motion, crepitus and joint swelling.  Patient has evidence of subchondral sclerosis, periarticular osteophytes and joint space narrowing by imaging studies. There is no active infection.  Patient Active Problem List   Diagnosis Date Noted  . Unilateral primary osteoarthritis, right knee 12/01/2015   Past Medical History:  Diagnosis Date  . Arthritis    knees  . Bladder spasms   . BPH (benign prostatic hyperplasia)   . DVT (deep venous thrombosis) ([place]) ocyt 2016   left leg tx medically  . GERD (gastroesophageal reflux disease)   . History of colon polyps    hyperplastic 06-14-2010  . Hydronephrosis   . Hypertension   . Urinary retention     Past Surgical History:  Procedure Laterality Date  . COLONOSCOPY  06/14/2010  . CYSTOSCOPY W/ RETROGRADES Bilateral 10/03/2015   Procedure: CYSTOSCOPY WITH RETROGRADE PYELOGRAM;  Surgeon: [name], MD;  Location: [facility];  Service: Urology;  Laterality: Bilateral;  . GREEN LIGHT LASER  TURP (TRANSURETHRAL RESECTION OF PROSTATE N/A 10/03/2015   Procedure: GREEN LIGHT LASER,  TURP - STAGED(TRANSURETHRAL RESECTION OF PROSTATE;  Surgeon: [name], MD;  Location: [facility];  Service: Urology;  Laterality: N/A;  . KNEE ARTHROSCOPY     not sure which knee  . QUADRICEPS TENDON REPAIR Bilateral left 02/24/2003/   right 2006    No prescriptions prior to admission.   No Known Allergies  Social History  Substance Use Topics  . Smoking status: Never Smoker  . Smokeless tobacco: Former Systems developer    Types: Chew    Quit date: 02/05/1968  . Alcohol use Yes     Comment: 1 2- beer per day    No family history on file.   Review of Systems  Musculoskeletal: Positive for joint pain.  All other systems reviewed and are negative.   Objective:  Physical Exam  Constitutional: He is oriented to person, place, and time. He appears well-developed and well-nourished.  HENT:  Head: Normocephalic and atraumatic.  Eyes: EOM are normal. Pupils are equal, round, and reactive to light.  Neck: Normal range of motion. Neck supple.  Cardiovascular: Normal rate and regular rhythm.   Respiratory: Effort normal and breath sounds normal.  GI: Soft. Bowel sounds are normal.  Musculoskeletal:       Right knee: He exhibits decreased range of motion, swelling, effusion and abnormal alignment. Tenderness found. Medial joint line and lateral joint line tenderness noted.  Neurological: He is alert and oriented to person, place, and time.  Skin: Skin is warm and dry.  Psychiatric: He has a normal mood and affect.    Vital  signs in last 24 hours:    Labs:   Estimated body mass index is 38.77 kg/m as calculated from the following:   Height as of 11/28/15: 5\' 11"  (1.803 m).   Weight as of 11/28/15: 278 lb (126.1 kg).   Imaging Review Plain radiographs demonstrate severe degenerative joint disease of the right knee(s). The overall alignment ismild varus. The bone quality appears  to be good for age and reported activity level.  Assessment/Plan:  End stage arthritis, right knee   The patient history, physical examination, clinical judgment of the provider and imaging studies are consistent with end stage degenerative joint disease of the right knee(s) and total knee arthroplasty is deemed medically necessary. The treatment options including medical management, injection therapy arthroscopy and arthroplasty were discussed at length. The risks and benefits of total knee arthroplasty were presented and reviewed. The risks due to aseptic loosening, infection, stiffness, patella tracking problems, thromboembolic complications and other imponderables were discussed. The patient acknowledged the explanation, agreed to proceed with the plan and consent was signed. Patient is being admitted for inpatient treatment for surgery, pain control, PT, OT, prophylactic antibiotics, VTE prophylaxis, progressive ambulation and ADL's and discharge planning. The patient is planning to be discharged home with home health services

## 2015-12-01 NOTE — Anesthesia Procedure Notes (Signed)
Spinal  Start time: 12/01/2015 9:05 AM End time: 12/01/2015 9:10 AM Staffing Anesthesiologist: Lauretta Grill Resident/CRNA: Darlys Gales R Performed: resident/CRNA  Preanesthetic Checklist Completed: patient identified, site marked, surgical consent, pre-op evaluation, timeout performed, IV checked, risks and benefits discussed and monitors and equipment checked Spinal Block Patient position: sitting Prep: DuraPrep Patient monitoring: heart rate, continuous pulse ox and blood pressure Approach: midline Location: L3-4 Injection technique: single-shot Needle Needle gauge: 24 G Needle length: 10 cm Needle insertion depth: 8 cm Assessment Sensory level: T6

## 2015-12-01 NOTE — Anesthesia Preprocedure Evaluation (Addendum)
Anesthesia Evaluation  Patient identified by MRN, date of birth, ID band Patient awake    Reviewed: Allergy & Precautions, NPO status , Patient's Chart, lab work & pertinent test results  History of Anesthesia Complications Negative for: history of anesthetic complications  Airway Mallampati: II  TM Distance: >3 FB Neck ROM: Full    Dental no notable dental hx. (+) Dental Advisory Given, Poor Dentition   Pulmonary neg pulmonary ROS,    Pulmonary exam normal breath sounds clear to auscultation       Cardiovascular hypertension, Pt. on medications Normal cardiovascular exam Rhythm:Regular Rate:Normal     Neuro/Psych negative neurological ROS  negative psych ROS   GI/Hepatic Neg liver ROS, GERD  ,  Endo/Other  obesity  Renal/GU negative Renal ROS  negative genitourinary   Musculoskeletal  (+) Arthritis ,   Abdominal   Peds negative pediatric ROS (+)  Hematology negative hematology ROS (+)   Anesthesia Other Findings   Reproductive/Obstetrics negative OB ROS                            Anesthesia Physical Anesthesia Plan  ASA: II  Anesthesia Plan: Spinal   Post-op Pain Management:    Induction:   Airway Management Planned:   Additional Equipment:   Intra-op Plan:   Post-operative Plan:   Informed Consent: I have reviewed the patients History and Physical, chart, labs and discussed the procedure including the risks, benefits and alternatives for the proposed anesthesia with the patient or authorized representative who has indicated his/her understanding and acceptance.   Dental advisory given  Plan Discussed with: CRNA  Anesthesia Plan Comments:         Anesthesia Quick Evaluation

## 2015-12-02 LAB — CBC
HCT: 32.2 % — ABNORMAL LOW (ref 39.0–52.0)
Hemoglobin: 10.4 g/dL — ABNORMAL LOW (ref 13.0–17.0)
MCH: 29.1 pg (ref 26.0–34.0)
MCHC: 32.3 g/dL (ref 30.0–36.0)
MCV: 90.2 fL (ref 78.0–100.0)
PLATELETS: 201 10*3/uL (ref 150–400)
RBC: 3.57 MIL/uL — AB (ref 4.22–5.81)
RDW: 16.1 % — ABNORMAL HIGH (ref 11.5–15.5)
WBC: 12.6 10*3/uL — ABNORMAL HIGH (ref 4.0–10.5)

## 2015-12-02 LAB — BASIC METABOLIC PANEL
Anion gap: 6 (ref 5–15)
BUN: 29 mg/dL — AB (ref 6–20)
CO2: 26 mmol/L (ref 22–32)
CREATININE: 2.35 mg/dL — AB (ref 0.61–1.24)
Calcium: 9 mg/dL (ref 8.9–10.3)
Chloride: 105 mmol/L (ref 101–111)
GFR calc Af Amer: 33 mL/min — ABNORMAL LOW (ref >60)
GFR, EST NON AFRICAN AMERICAN: 28 mL/min — AB (ref >60)
Glucose, Bld: 150 mg/dL — ABNORMAL HIGH (ref 65–99)
POTASSIUM: 5.1 mmol/L (ref 3.5–5.1)
SODIUM: 137 mmol/L (ref 135–145)

## 2015-12-02 MED ORDER — METHOCARBAMOL 500 MG PO TABS: 500.0000 mg | ORAL_TABLET | Freq: Four times a day (QID) | ORAL | 0 refills | Status: DC | PRN

## 2015-12-02 MED ORDER — OXYCODONE-ACETAMINOPHEN 5-325 MG PO TABS: 1.0000 | ORAL_TABLET | ORAL | 0 refills | Status: DC | PRN

## 2015-12-02 NOTE — Progress Notes (Signed)
Patient ID: Stephen Maldonado, male   DOB: 1955/07/23, 60 y.o.   MRN: [id number] Doing very well.  Can be discharged to home today.

## 2015-12-02 NOTE — Evaluation (Signed)
Occupational Therapy Evaluation Patient Details Name: Stephen Maldonado MRN: [id number] DOB: 20-Jun-1955 Today's Date: 12/02/2015    History of Present Illness Pt s/p R TKR and with hx of bilat quad tendon repair   Clinical Impression   OT education complete    Follow Up Recommendations  No OT follow up    Equipment Recommendations  None recommended by OT       Precautions / Restrictions Precautions Precautions: Knee;Fall Required Braces or Orthoses: Knee Immobilizer - Right Knee Immobilizer - Right: Discontinue once straight leg raise with < 10 degree lag Restrictions Weight Bearing Restrictions: No Other Position/Activity Restrictions: WBAT      Mobility Bed Mobility               General bed mobility comments: pt in chair  Transfers Overall transfer level: Needs assistance Equipment used: Rolling walker (2 wheeled) Transfers: Sit to/from [employer] Sit to Stand: Supervision Stand pivot transfers: Supervision       General transfer comment: cues for LE management and use of UEs to self assist         ADL Overall ADL's : Needs assistance/impaired                     Lower Body Dressing: Minimal assistance;Sit to/from stand;Cueing for safety;Cueing for sequencing;Cueing for compensatory techniques   Toilet Transfer: Supervision/safety;Regular Toilet;RW;Ambulation;Cueing for sequencing;Cueing for safety   Toileting- Clothing Manipulation and Hygiene: Supervision/safety;Sit to/from stand   Tub/ Shower Transfer: Walk-in shower;Cueing for safety;Cueing for sequencing;Supervision/safety     General ADL Comments: wife will A as needed               Pertinent Vitals/Pain Pain Score: 3  Pain Location: r knee Pain Descriptors / Indicators: Aching Pain Intervention(s): Monitored during session;Repositioned;Ice applied     Hand Dominance     Extremity/Trunk Assessment Upper Extremity Assessment Upper Extremity  Assessment: Overall WFL for tasks assessed           Communication Communication Communication: No difficulties   Cognition Arousal/Alertness: Awake/alert Behavior During Therapy: WFL for tasks assessed/performed Overall Cognitive Status: Within Functional Limits for tasks assessed                                Home Living Family/patient expects to be discharged to:: Private residence Living Arrangements: Spouse/significant other Available Help at Discharge: Family Type of Home: House Home Access: Stairs to enter Technical brewer of Steps: 4 Entrance Stairs-Rails: None Home Layout: One level     Bathroom Shower/Tub: Tub/shower unit;Walk-in Psychologist, prison and probation services: Standard     Home Equipment: Crutches          Prior Functioning/Environment Level of Independence: Independent                 OT Problem List:     OT Treatment/Interventions:      OT Goals(Current goals can be found in the care plan section) Acute Rehab OT Goals Patient Stated Goal: Regain IND  OT Frequency:                End of Session Equipment Utilized During Treatment: Rolling walker Nurse Communication: Mobility status  Activity Tolerance: Patient tolerated treatment well Patient left: in chair;with call bell/phone within reach   Time: 1055-1115 OT Time Calculation (min): 20 min Charges:  OT General Charges $OT Visit: 1 Procedure OT Evaluation $OT Eval Low Complexity: 1 Procedure  G-Codes:    Betsy Pries 29-Dec-2015, 12:33 PM

## 2015-12-02 NOTE — Discharge Summary (Signed)
Patient ID: Stephen Maldonado MRN: [id number] DOB/AGE: 11-Jul-1955 60 y.o.  Admit date: 12/01/2015 Discharge date: 12/02/2015  Admission Diagnoses:  Principal Problem:   Unilateral primary osteoarthritis, right knee Active Problems:   Status post total right knee replacement   Discharge Diagnoses:  Same  Past Medical History:  Diagnosis Date  . Arthritis    knees  . Bladder spasms   . BPH (benign prostatic hyperplasia)   . DVT (deep venous thrombosis) ([place]) ocyt 2016   left leg tx medically  . GERD (gastroesophageal reflux disease)   . History of colon polyps    hyperplastic 06-14-2010  . Hydronephrosis   . Hypertension   . Urinary retention     Surgeries: Procedure(s): RIGHT TOTAL KNEE ARTHROPLASTY on 12/01/2015   Consultants:   Discharged Condition: Improved  Hospital Course: Stephen Maldonado is an 60 y.o. male who was admitted 12/01/2015 for operative treatment ofUnilateral primary osteoarthritis, right knee. Patient has severe unremitting pain that affects sleep, daily activities, and work/hobbies. After pre-op clearance the patient was taken to the operating room on 12/01/2015 and underwent  Procedure(s): RIGHT TOTAL KNEE ARTHROPLASTY.    Patient was given perioperative antibiotics: Anti-infectives    Start     Dose/Rate Route Frequency Ordered Stop   12/01/15 1600  ceFAZolin (ANCEF) IVPB 1 g/50 mL premix     1 g 100 mL/hr over 30 Minutes Intravenous Every 6 hours 12/01/15 1320 12/01/15 2146   12/01/15 0600  ceFAZolin (ANCEF) 3 g in dextrose 5 % 50 mL IVPB     3 g 130 mL/hr over 30 Minutes Intravenous On call to O.R. 11/30/15 1326 12/01/15 0912       Patient was given sequential compression devices, early ambulation, and chemoprophylaxis to prevent DVT.  Patient benefited maximally from hospital stay and there were no complications.    Recent vital signs: Patient Vitals for the past 24 hrs:  BP Temp Temp src Pulse Resp SpO2  12/02/15 0937 (!) 151/63 98 F  (36.7 C) Oral 63 16 98 %  12/02/15 0520 (!) 154/51 98 F (36.7 C) Oral (!) 57 16 97 %  12/02/15 0258 138/60 97.9 F (36.6 C) Oral 67 16 98 %  12/01/15 2229 (!) 172/71 98.2 F (36.8 C) Oral 68 16 93 %  12/01/15 1754 (!) 160/68 97.3 F (36.3 C) Oral 66 16 98 %  12/01/15 1610 (!) 161/61 97.8 F (36.6 C) Oral 65 16 98 %  12/01/15 1510 (!) 150/71 98.3 F (36.8 C) Oral 66 16 98 %  12/01/15 1410 (!) 141/52 98.3 F (36.8 C) Oral 62 14 98 %  12/01/15 1316 136/62 98 F (36.7 C) - - 14 98 %  12/01/15 1300 (!) 147/77 97.5 F (36.4 C) - (!) 54 12 98 %  12/01/15 1245 136/72 - - 64 12 100 %  12/01/15 1230 131/75 97.5 F (36.4 C) - 63 (!) 21 99 %  12/01/15 1215 121/64 - - (!) 57 12 98 %  12/01/15 1200 131/67 - - 60 13 99 %  12/01/15 1145 132/69 - - 60 11 96 %  12/01/15 1130 132/69 - - 69 16 99 %  12/01/15 1115 (!) 152/75 98.7 F (37.1 C) - 65 10 100 %     Recent laboratory studies:  Recent Labs  12/01/15 0809 12/02/15 0507  WBC  --  12.6*  HGB  --  10.4*  HCT  --  32.2*  PLT  --  201  NA 138 137  K 4.0 5.1  CL 107 105  CO2 25 26  BUN 25* 29*  CREATININE 2.38* 2.35*  GLUCOSE 97 150*  CALCIUM 9.2 9.0     Discharge Medications:     Medication List    STOP taking these medications   sulfamethoxazole-trimethoprim 400-80 MG tablet Commonly known as:  BACTRIM     TAKE these medications   acetaminophen 500 MG tablet Commonly known as:  TYLENOL Take 1,000 mg by mouth every 6 (six) hours as needed (for pain.).   aspirin EC 81 MG tablet Take 81 mg by mouth daily.   ciprofloxacin 500 MG tablet Commonly known as:  CIPRO Take 500 mg by mouth 2 (two) times daily. 10 day therapy course patient began on 11/16/15   CoQ10 100 MG Caps Take 100 mg by mouth daily.   diltiazem 240 MG 24 hr capsule Commonly known as:  CARDIZEM CD Take 240 mg by mouth daily.   Fish Oil 1000 MG Caps Take 1,000 mg by mouth daily.   furosemide 20 MG tablet Commonly known as:  LASIX Take 20 mg  by mouth daily as needed. For fluid retention.   GLUCOSAMINE CHONDR COMPLEX PO Take 1 capsule by mouth daily.   methocarbamol 500 MG tablet Commonly known as:  ROBAXIN Take 1 tablet (500 mg total) by mouth every 6 (six) hours as needed for muscle spasms.   oxyCODONE-acetaminophen 5-325 MG tablet Commonly known as:  ROXICET Take 1-2 tablets by mouth every 4 (four) hours as needed for severe pain. What changed:  Another medication with the same name was added. Make sure you understand how and when to take each.   oxyCODONE-acetaminophen 5-325 MG tablet Commonly known as:  ROXICET Take 1-2 tablets by mouth every 4 (four) hours as needed. What changed:  You were already taking a medication with the same name, and this prescription was added. Make sure you understand how and when to take each.   tamsulosin 0.4 MG Caps capsule Commonly known as:  FLOMAX Take 0.4 mg by mouth every morning.   vitamin B-12 1000 MCG tablet Commonly known as:  CYANOCOBALAMIN Take 1,000 mcg by mouth daily.   Vitamin D-3 1000 units Caps Take 1,000 Units by mouth daily.       Diagnostic Studies: Dg Knee Right Port  Result Date: 12/01/2015 CLINICAL DATA:  Post total knee replacement. EXAM: PORTABLE RIGHT KNEE - 1-2 VIEW COMPARISON:  None. FINDINGS: Post total knee replacement without evidence of hardware failure or loosening. Alignment appears anatomic. There is a small expected lipohemarthrosis and minimal amount of intra-articular air. Expected minimal amount of adjacent subcutaneous edema and soft tissue swelling. No radiopaque foreign body. IMPRESSION: Post right total knee replacement without evidence of complication. Electronically Signed   By: Sandi Mariscal M.D.   On: 12/01/2015 12:14    Disposition: 01-Home or Self Care  Discharge Instructions    Call MD / Call 911    Complete by:  As directed    If you experience chest pain or shortness of breath, CALL 911 and be transported to the hospital  emergency room.  If you develope a fever above 101 F, pus (white drainage) or increased drainage or redness at the wound, or calf pain, call your surgeon's office.   Constipation Prevention    Complete by:  As directed    Drink plenty of fluids.  Prune juice may be helpful.  You may use a stool softener, such as Colace (over the counter) 100 mg twice a day.  Use  MiraLax (over the counter) for constipation as needed.   Diet - low sodium heart healthy    Complete by:  As directed    Discharge patient    Complete by:  As directed    Increase activity slowly as tolerated    Complete by:  As directed       Follow-up Information    [name], MD Follow up in 2 week(s).   Specialty:  Orthopedic Surgery Contact information: [address] [name] [address] [phone]            Signed: [name] 12/02/2015, 11:03 AM

## 2015-12-02 NOTE — Op Note (Signed)
NAMEMarland Kitchen  Maldonado, Stephen NO.:  [id number]  MEDICAL RECORD NO.:  [id number]  LOCATION:  80                         FACILITY:  [facility]  PHYSICIAN:  Lind Guest. Ninfa Linden, M.D.DATE OF BIRTH:  Nov 25, 1955  DATE OF PROCEDURE:  12/01/2015 DATE OF DISCHARGE:                              OPERATIVE REPORT   PREOPERATIVE DIAGNOSIS:  Primary osteoarthritis and degenerative joint disease of right knee.  POSTOPERATIVE DIAGNOSIS:  Primary osteoarthritis and degenerative joint disease of right knee.  PROCEDURE:  Right total knee arthroplasty.  IMPLANTS:  Stryker Triathlon knee with size 4 femur, size 4 tibial tray, 9-mm fix-bearing polyethylene insert, size 35 patellar button.  SURGEON:  Lind Guest. Ninfa Linden, M.D.  ASSISTANT:  Erskine Emery, PA-C.  ANESTHESIA:  Spinal.  ANTIBIOTICS:  3 g of IV Ancef.  BLOOD LOSS:  Less than 100 mL.  TOURNIQUET TIME:  Less than 1 hour.  COMPLICATIONS:  None.  INDICATIONS:  Mr. Silva is a 60 year old gentleman, who I have known for over a decade now.  We first met when he sustained a quad tendon rupture in around 2008.  He had direct primary repair of ruptured quadriceps tendon.  Over the years, he has developed osteoarthritis of both of his knees.  Both knees have medial joint line tenderness, patellofemoral crepitation, slight varus deformity, recurrent effusions and significant pain.  His x-rays show complete loss of the medial joint space on each knee, periarticular osteophytes, sclerotic changes and obvious tricompartmental arthritis.  He has tried and failed all forms of conservative treatment.  At this point, he does wish to proceed with total knee arthroplasty.  He understands the risk of acute blood loss anemia, nerve and vessel injury, fracture, infection and DVT.  He understands our goals are decreased pain, improved mobility and overall improved quality of life.  PROCEDURE DESCRIPTION:  After informed consent was  obtained, appropriate right knee was marked.  He was brought to the operating room.  He sat up on the operating table.  Spinal anesthesia was obtained.  He was then laid in a supine position on the table.  A Foley catheter was placed and then a nonsterile tourniquet was placed around his upper right leg.  His right leg was prepped and draped with DuraPrep and sterile drapes.  Time- out was called, he was identified as correct patient and correct right knee.  We then went through his old incision and dissected proximally and distally, and dissected down to the knee joint.  We carried out a medial parapatellar arthrotomy, finding a very large joint effusion.  We opened the knee up and we found significant tricompartment arthritis throughout the knee with significant cartilage loss.  We removed remnants of the ACL, PCL, medial and lateral meniscus.  With the knee in a flexed position, we used extramedullary and cutting guide for the tibia.  We set this for a neutral slope, correcting for varus and valgus.  Then, we made our cut, taking 9 mm based off the high side.  We made this cut without difficulty.  We then went to the femur.  Using intramedullary guide to the intramedullary notch of the femur, we set our distal femoral cut at  8 mm of distal femoral cut, set for 5 degrees, externally rotated.  We made this cut without difficulty and brought the knee back down to full extension.  We cleaned remnants of the meniscus from the back of the knee and placed a 9-mm extension block and we achieved full extension with that 9-mm block.  We then went back to the femur and put our femoral sizing guide based off the epicondylar axis, setting for 3 degrees right, externally rotated.  We chose a size 4 femur.  We then put the 4-in-1 cutting block for 4 femur.  We made our anterior and posterior cuts followed by our chamfer cuts.  We then made our femoral box cut based off the size 4 femur.  We went back  to the tibia, choosing a rotation of the tibia off the tibial tubercle and the femur and choosing a size 4 tibial tray.  We made our keel punch for this and then with the 4 tibial tray and followed by the 4 femur, we placed a 9-mm fix-bearing trial polyethylene insert and we were pleased with the flexion and extension gaps and range of motion and stability. We then made our patellar cut and drilled 3 holes for patella, choosing a size 35 patellar button.  We then removed all instrumentation and trial components.  We irrigated the knee with normal saline solution and mixed our cement.  We then cemented the real St[employer]ker Triathlon tibial tray size 4 followed by the real size 4 femur.  We placed a 9-mm fix- bearing polyethylene insert and cemented our patellar button.  Once the cement had hardened, we removed cement debris from the knee.  The tourniquet was let down.  Hemostasis was obtained with electrocautery. We then closed the arthrotomy with interrupted #1 Vicryl suture followed by 0 Vicryl in the deep tissue, 2-0 Vicryl in the subcutaneous tissue, 4- 0 Monocryl subcuticular stitch and Steri-Strips on the skin.  Well- padded sterile dressing was applied.  He was taken to the recovery room in stable condition.  All final counts were correct.  There were no complications noted.  Of note, Erskine Emery, PA-C assisted the entire case.  His assistance was crucial for facilitating all aspects of this case.     Lind Guest. Ninfa Linden, M.D.     CYB/MEDQ  D:  12/01/2015  T:  12/02/2015  Job:  [id number]

## 2015-12-02 NOTE — Progress Notes (Signed)
Physical Therapy Treatment Patient Details Name: Stephen Maldonado MRN: [id number] DOB: [birth year] Today's Date: 12/02/2015    History of Present Illness Pt s/p R TKR and with hx of bilat quad tendon repair    PT Comments    Pt motivated and progressing well.  Reviewed stairs and therex with pt and spouse.  Follow Up Recommendations  Home health PT     Equipment Recommendations  Rolling walker with 5" wheels    Recommendations for Other Services OT consult     Precautions / Restrictions Precautions Precautions: Knee;Fall Required Braces or Orthoses: Knee Immobilizer - Right Knee Immobilizer - Right: Discontinue once straight leg raise with < 10 degree lag Restrictions Weight Bearing Restrictions: No Other Position/Activity Restrictions: WBAT    Mobility  Bed Mobility Overal bed mobility: Needs Assistance Bed Mobility: Supine to Sit     Supine to sit: Supervision     General bed mobility comments: NT  Transfers Overall transfer level: Needs assistance Equipment used: Rolling walker (2 wheeled) Transfers: Sit to/from Stand Sit to Stand: Supervision Stand pivot transfers: Supervision       General transfer comment: min cues for saftey and use of UEs to self assist  Ambulation/Gait Ambulation/Gait assistance: Supervision Ambulation Distance (Feet): 180 Feet Assistive device: Rolling walker (2 wheeled) Gait Pattern/deviations: Step-to pattern;Shuffle;Trunk flexed Gait velocity: decr Gait velocity interpretation: Below normal speed for age/gender General Gait Details: cues for sequence, posture and position from RW   Stairs Stairs: Yes Stairs assistance: Min guard Stair Management: One rail Left;Step to pattern;Forwards;With crutches Number of Stairs: 4 General stair comments: min cues for sequence and foot/crutch placement  Wheelchair Mobility    Modified Rankin (Stroke Patients Only)       Balance Overall balance assessment: No apparent balance  deficits (not formally assessed)                                  Cognition Arousal/Alertness: Awake/alert Behavior During Therapy: WFL for tasks assessed/performed Overall Cognitive Status: Within Functional Limits for tasks assessed                      Exercises Total Joint Exercises Ankle Circles/Pumps: AROM;Both;15 reps;Supine Quad Sets: AROM;Both;10 reps;Supine Heel Slides: AAROM;Right;15 reps;Supine Straight Leg Raises: AAROM;AROM;Right;15 reps;Supine Goniometric ROM: AAROM at R knee -8 - 60    General Comments        Pertinent Vitals/Pain Pain Assessment: 0-10 Pain Score: 5  Pain Location: R knee Pain Descriptors / Indicators: Aching;Sore Pain Intervention(s): Limited activity within patient's tolerance;Monitored during session;Premedicated before session;Ice applied    Home Living Family/patient expects to be discharged to:: Private residence Living Arrangements: Spouse/significant other Available Help at Discharge: Family Type of Home: House Home Access: Stairs to enter Entrance Stairs-Rails: None Home Layout: One level Home Equipment: Crutches      Prior Function Level of Independence: Independent          PT Goals (current goals can now be found in the care plan section) Acute Rehab PT Goals Patient Stated Goal: Regain IND PT Goal Formulation: With patient Time For Goal Achievement: 12/05/15 Potential to Achieve Goals: Good Progress towards PT goals: Progressing toward goals    Frequency    7X/week      PT Plan Current plan remains appropriate    Co-evaluation             End of Session Equipment Utilized During Treatment:  Gait belt;Right knee immobilizer Activity Tolerance: Patient tolerated treatment well Patient left: in chair;with call bell/phone within reach;with family/visitor present     Time: 1353-1420 PT Time Calculation (min) (ACUTE ONLY): 27 min  Charges:  $Gait Training: 23-37  mins $Therapeutic Exercise: 8-22 mins                    G Codes:      Stephen Maldonado 12-17-15, 2:46 PM

## 2015-12-02 NOTE — Progress Notes (Signed)
Nurse reviewed discharge information with pt and his wife.  Pt verbalized understanding of discharge instructions, follow up appointment and new medications.  Prescriptions given to pt prior to discharge.

## 2015-12-02 NOTE — Discharge Instructions (Signed)

## 2015-12-02 NOTE — Progress Notes (Signed)
Physical Therapy Treatment Patient Details Name: Stephen Maldonado MRN: [id number] DOB: 1955-08-08 Today's Date: 12/02/2015    History of Present Illness Pt s/p R TKR and with hx of bilat quad tendon repair    PT Comments    Pt progressing well and hopeful for dc home later today  Follow Up Recommendations  Home health PT     Equipment Recommendations  Rolling walker with 5" wheels    Recommendations for Other Services OT consult     Precautions / Restrictions Precautions Precautions: Knee;Fall Required Braces or Orthoses: Knee Immobilizer - Right Knee Immobilizer - Right: Discontinue once straight leg raise with < 10 degree lag (Pt performed IND SLR this am) Restrictions Weight Bearing Restrictions: No Other Position/Activity Restrictions: WBAT    Mobility  Bed Mobility Overal bed mobility: Needs Assistance Bed Mobility: Supine to Sit     Supine to sit: Supervision     General bed mobility comments: Pt to EOB with min VC  Transfers Overall transfer level: Needs assistance Equipment used: Rolling walker (2 wheeled) Transfers: Sit to/from Stand Sit to Stand: Min guard;Supervision Stand pivot transfers: Supervision       General transfer comment: cues for LE management and use of UEs to self assist  Ambulation/Gait Ambulation/Gait assistance: Min guard Ambulation Distance (Feet): 123 Feet Assistive device: Rolling walker (2 wheeled) Gait Pattern/deviations: Step-to pattern;Shuffle;Trunk flexed Gait velocity: decr Gait velocity interpretation: Below normal speed for age/gender General Gait Details: cues for sequence, posture and position from [employer]            Wheelchair Mobility    Modified Rankin (Stroke Patients Only)       Balance                                    Cognition Arousal/Alertness: Awake/alert Behavior During Therapy: WFL for tasks assessed/performed Overall Cognitive Status: Within Functional Limits for  tasks assessed                      Exercises Total Joint Exercises Ankle Circles/Pumps: AROM;Both;15 reps;Supine Quad Sets: AROM;Both;10 reps;Supine Heel Slides: AAROM;Right;15 reps;Supine Straight Leg Raises: AAROM;AROM;Right;15 reps;Supine Goniometric ROM: AAROM at R knee -8 - 60    General Comments        Pertinent Vitals/Pain Pain Assessment: 0-10 Pain Score: 3  Pain Location: R knee Pain Descriptors / Indicators: Aching;Sore Pain Intervention(s): Limited activity within patient's tolerance;Monitored during session;Premedicated before session;Ice applied    Home Living Family/patient expects to be discharged to:: Private residence Living Arrangements: Spouse/significant other Available Help at Discharge: Family Type of Home: House Home Access: Stairs to enter Entrance Stairs-Rails: None Home Layout: One level Home Equipment: Crutches      Prior Function Level of Independence: Independent          PT Goals (current goals can now be found in the care plan section) Acute Rehab PT Goals Patient Stated Goal: Regain IND PT Goal Formulation: With patient Time For Goal Achievement: 12/05/15 Potential to Achieve Goals: Good Progress towards PT goals: Progressing toward goals    Frequency    7X/week      PT Plan Current plan remains appropriate    Co-evaluation             End of Session Equipment Utilized During Treatment: Gait belt;Right knee immobilizer Activity Tolerance: Patient tolerated treatment well Patient left: in chair;with call bell/phone within  reach     Time: 1020-1046 PT Time Calculation (min) (ACUTE ONLY): 26 min  Charges:  $Gait Training: 8-22 mins $Therapeutic Exercise: 8-22 mins                    G Codes:      Haeven Nickle 12/11/15, 2:42 PM

## 2015-12-02 NOTE — Care Management Note (Signed)
Case Management Note  Patient Details  Name: Stephen Maldonado MRN: [id number] Date of Birth: 09/09/1955  Subjective/Objective:   s/p R TKR               Action/Plan: Discharge Planning: AVS reviewed: NCM spoke to pt and requested RW for home. Offered choice for [facility]. Pt agreeable to [facility]. States he is in [place]. Contacted [facility] to verify they can accept referral. Waiting call back. Contacted [facility] DME rep for RW for home.  Expected Discharge Date:  12/02/2015            Expected Discharge Plan:  [place]  In-House Referral:  NA  Discharge planning Services  CM Consult  Post Acute Care Choice:  Home Health Choice offered to:  Patient  DME Arranged:  Walker rolling DME Agency:  [place]:  PT [place] Agency:  [facility])  Status of Service:  Completed, signed off  If discussed at [employer] of Stay Meetings, dates discussed:    Additional Comments:  Erenest Rasher, RN 12/02/2015, 1:51 PM

## 2015-12-04 DIAGNOSIS — Z87891 Personal history of nicotine dependence: Secondary | ICD-10-CM | POA: Diagnosis not present

## 2015-12-04 DIAGNOSIS — Z9079 Acquired absence of other genital organ(s): Secondary | ICD-10-CM | POA: Diagnosis not present

## 2015-12-04 DIAGNOSIS — Z79891 Long term (current) use of opiate analgesic: Secondary | ICD-10-CM | POA: Diagnosis not present

## 2015-12-04 DIAGNOSIS — M1712 Unilateral primary osteoarthritis, left knee: Secondary | ICD-10-CM | POA: Diagnosis not present

## 2015-12-04 DIAGNOSIS — I10 Essential (primary) hypertension: Secondary | ICD-10-CM | POA: Diagnosis not present

## 2015-12-04 DIAGNOSIS — Z96651 Presence of right artificial knee joint: Secondary | ICD-10-CM | POA: Diagnosis not present

## 2015-12-04 DIAGNOSIS — Z7982 Long term (current) use of aspirin: Secondary | ICD-10-CM | POA: Diagnosis not present

## 2015-12-04 DIAGNOSIS — Z86718 Personal history of other venous thrombosis and embolism: Secondary | ICD-10-CM | POA: Diagnosis not present

## 2015-12-04 DIAGNOSIS — Z471 Aftercare following joint replacement surgery: Secondary | ICD-10-CM | POA: Diagnosis not present

## 2015-12-05 DIAGNOSIS — Z96651 Presence of right artificial knee joint: Secondary | ICD-10-CM | POA: Diagnosis not present

## 2015-12-05 DIAGNOSIS — Z471 Aftercare following joint replacement surgery: Secondary | ICD-10-CM | POA: Diagnosis not present

## 2015-12-05 DIAGNOSIS — I10 Essential (primary) hypertension: Secondary | ICD-10-CM | POA: Diagnosis not present

## 2015-12-05 DIAGNOSIS — Z87891 Personal history of nicotine dependence: Secondary | ICD-10-CM | POA: Diagnosis not present

## 2015-12-05 DIAGNOSIS — Z7982 Long term (current) use of aspirin: Secondary | ICD-10-CM | POA: Diagnosis not present

## 2015-12-05 DIAGNOSIS — Z79891 Long term (current) use of opiate analgesic: Secondary | ICD-10-CM | POA: Diagnosis not present

## 2015-12-05 DIAGNOSIS — M1712 Unilateral primary osteoarthritis, left knee: Secondary | ICD-10-CM | POA: Diagnosis not present

## 2015-12-05 DIAGNOSIS — Z9079 Acquired absence of other genital organ(s): Secondary | ICD-10-CM | POA: Diagnosis not present

## 2015-12-05 DIAGNOSIS — Z86718 Personal history of other venous thrombosis and embolism: Secondary | ICD-10-CM | POA: Diagnosis not present

## 2015-12-06 ENCOUNTER — Telehealth (INDEPENDENT_AMBULATORY_CARE_PROVIDER_SITE_OTHER): Payer: Self-pay | Admitting: Orthopaedic Surgery

## 2015-12-06 DIAGNOSIS — Z029 Encounter for administrative examinations, unspecified: Secondary | ICD-10-CM

## 2015-12-06 NOTE — Telephone Encounter (Signed)
Patient needs letter stating that he had surgery, and will need to be out of work for 8 weeks.  Please contact pt's wife [phone]) [phone].

## 2015-12-07 DIAGNOSIS — Z87891 Personal history of nicotine dependence: Secondary | ICD-10-CM | POA: Diagnosis not present

## 2015-12-07 DIAGNOSIS — Z471 Aftercare following joint replacement surgery: Secondary | ICD-10-CM | POA: Diagnosis not present

## 2015-12-07 DIAGNOSIS — Z7982 Long term (current) use of aspirin: Secondary | ICD-10-CM | POA: Diagnosis not present

## 2015-12-07 DIAGNOSIS — M1712 Unilateral primary osteoarthritis, left knee: Secondary | ICD-10-CM | POA: Diagnosis not present

## 2015-12-07 DIAGNOSIS — I10 Essential (primary) hypertension: Secondary | ICD-10-CM | POA: Diagnosis not present

## 2015-12-07 DIAGNOSIS — Z86718 Personal history of other venous thrombosis and embolism: Secondary | ICD-10-CM | POA: Diagnosis not present

## 2015-12-07 DIAGNOSIS — Z9079 Acquired absence of other genital organ(s): Secondary | ICD-10-CM | POA: Diagnosis not present

## 2015-12-07 DIAGNOSIS — Z96651 Presence of right artificial knee joint: Secondary | ICD-10-CM | POA: Diagnosis not present

## 2015-12-07 DIAGNOSIS — Z79891 Long term (current) use of opiate analgesic: Secondary | ICD-10-CM | POA: Diagnosis not present

## 2015-12-08 ENCOUNTER — Encounter (INDEPENDENT_AMBULATORY_CARE_PROVIDER_SITE_OTHER): Payer: Self-pay

## 2015-12-08 NOTE — Telephone Encounter (Signed)
Wife aware note is ready, she asked me to send with the disability form

## 2015-12-11 DIAGNOSIS — Z87891 Personal history of nicotine dependence: Secondary | ICD-10-CM | POA: Diagnosis not present

## 2015-12-11 DIAGNOSIS — I10 Essential (primary) hypertension: Secondary | ICD-10-CM | POA: Diagnosis not present

## 2015-12-11 DIAGNOSIS — Z471 Aftercare following joint replacement surgery: Secondary | ICD-10-CM | POA: Diagnosis not present

## 2015-12-11 DIAGNOSIS — Z7982 Long term (current) use of aspirin: Secondary | ICD-10-CM | POA: Diagnosis not present

## 2015-12-11 DIAGNOSIS — Z79891 Long term (current) use of opiate analgesic: Secondary | ICD-10-CM | POA: Diagnosis not present

## 2015-12-11 DIAGNOSIS — Z86718 Personal history of other venous thrombosis and embolism: Secondary | ICD-10-CM | POA: Diagnosis not present

## 2015-12-11 DIAGNOSIS — M1712 Unilateral primary osteoarthritis, left knee: Secondary | ICD-10-CM | POA: Diagnosis not present

## 2015-12-11 DIAGNOSIS — Z96651 Presence of right artificial knee joint: Secondary | ICD-10-CM | POA: Diagnosis not present

## 2015-12-11 DIAGNOSIS — Z9079 Acquired absence of other genital organ(s): Secondary | ICD-10-CM | POA: Diagnosis not present

## 2015-12-13 DIAGNOSIS — Z86718 Personal history of other venous thrombosis and embolism: Secondary | ICD-10-CM | POA: Diagnosis not present

## 2015-12-13 DIAGNOSIS — Z471 Aftercare following joint replacement surgery: Secondary | ICD-10-CM | POA: Diagnosis not present

## 2015-12-13 DIAGNOSIS — Z7982 Long term (current) use of aspirin: Secondary | ICD-10-CM | POA: Diagnosis not present

## 2015-12-13 DIAGNOSIS — Z79891 Long term (current) use of opiate analgesic: Secondary | ICD-10-CM | POA: Diagnosis not present

## 2015-12-13 DIAGNOSIS — M1712 Unilateral primary osteoarthritis, left knee: Secondary | ICD-10-CM | POA: Diagnosis not present

## 2015-12-13 DIAGNOSIS — Z96651 Presence of right artificial knee joint: Secondary | ICD-10-CM | POA: Diagnosis not present

## 2015-12-13 DIAGNOSIS — Z87891 Personal history of nicotine dependence: Secondary | ICD-10-CM | POA: Diagnosis not present

## 2015-12-13 DIAGNOSIS — I10 Essential (primary) hypertension: Secondary | ICD-10-CM | POA: Diagnosis not present

## 2015-12-13 DIAGNOSIS — Z9079 Acquired absence of other genital organ(s): Secondary | ICD-10-CM | POA: Diagnosis not present

## 2015-12-14 ENCOUNTER — Ambulatory Visit (INDEPENDENT_AMBULATORY_CARE_PROVIDER_SITE_OTHER): Payer: BLUE CROSS/BLUE SHIELD | Admitting: Orthopaedic Surgery

## 2015-12-14 DIAGNOSIS — E559 Vitamin D deficiency, unspecified: Secondary | ICD-10-CM | POA: Diagnosis not present

## 2015-12-14 DIAGNOSIS — R972 Elevated prostate specific antigen [PSA]: Secondary | ICD-10-CM | POA: Diagnosis not present

## 2015-12-14 DIAGNOSIS — I10 Essential (primary) hypertension: Secondary | ICD-10-CM | POA: Diagnosis not present

## 2015-12-14 DIAGNOSIS — Z96651 Presence of right artificial knee joint: Secondary | ICD-10-CM

## 2015-12-14 DIAGNOSIS — Z0000 Encounter for general adult medical examination without abnormal findings: Secondary | ICD-10-CM | POA: Diagnosis not present

## 2015-12-14 DIAGNOSIS — I129 Hypertensive chronic kidney disease with stage 1 through stage 4 chronic kidney disease, or unspecified chronic kidney disease: Secondary | ICD-10-CM | POA: Diagnosis not present

## 2015-12-14 DIAGNOSIS — E538 Deficiency of other specified B group vitamins: Secondary | ICD-10-CM | POA: Diagnosis not present

## 2015-12-14 MED ORDER — OXYCODONE-ACETAMINOPHEN 5-325 MG PO TABS: 1.0000 | ORAL_TABLET | Freq: Four times a day (QID) | ORAL | 0 refills | Status: DC | PRN

## 2015-12-14 MED ORDER — OXYCODONE-ACETAMINOPHEN 5-325 MG PO TABS: 1.0000 | ORAL_TABLET | ORAL | 0 refills | Status: DC | PRN

## 2015-12-14 MED ORDER — CIPROFLOXACIN HCL 500 MG PO TABS: 500.0000 mg | ORAL_TABLET | Freq: Two times a day (BID) | ORAL | 0 refills | Status: DC

## 2015-12-14 MED ORDER — DOXYCYCLINE HYCLATE 100 MG PO CAPS: 100.0000 mg | ORAL_CAPSULE | Freq: Two times a day (BID) | ORAL | 0 refills | Status: DC

## 2015-12-14 NOTE — Progress Notes (Signed)
Mr. Ent is now a most 2 weeks status post a right total knee replacement to treat his osteoporosis of his knee. He's been doing well postoperative. His only been able to be on an aspirin postoperative for DVT coverage due to severe kidney disease. He is doing well but has had some swelling in his leg in general and some redness in the shin. He said this not changed in the next feels better each day and he says he does not feel [place].  Generalization his incision looks great itself there is just a mild knee effusion and he has good flexion-extension of his knee he gets 90. His right calf is soft. There is some slight redness of his anterior shin and this is slightly painful. I did not x-ray his knee today.  Given this a slight redness over like to start him on both Cipro and doxycycline and would like to see him back in just 5 days to see how is doing overall. I did refill his oxycodone.

## 2015-12-15 DIAGNOSIS — Z7982 Long term (current) use of aspirin: Secondary | ICD-10-CM | POA: Diagnosis not present

## 2015-12-15 DIAGNOSIS — I10 Essential (primary) hypertension: Secondary | ICD-10-CM | POA: Diagnosis not present

## 2015-12-15 DIAGNOSIS — Z87891 Personal history of nicotine dependence: Secondary | ICD-10-CM | POA: Diagnosis not present

## 2015-12-15 DIAGNOSIS — Z79891 Long term (current) use of opiate analgesic: Secondary | ICD-10-CM | POA: Diagnosis not present

## 2015-12-15 DIAGNOSIS — Z86718 Personal history of other venous thrombosis and embolism: Secondary | ICD-10-CM | POA: Diagnosis not present

## 2015-12-15 DIAGNOSIS — Z9079 Acquired absence of other genital organ(s): Secondary | ICD-10-CM | POA: Diagnosis not present

## 2015-12-15 DIAGNOSIS — Z96651 Presence of right artificial knee joint: Secondary | ICD-10-CM | POA: Diagnosis not present

## 2015-12-15 DIAGNOSIS — Z471 Aftercare following joint replacement surgery: Secondary | ICD-10-CM | POA: Diagnosis not present

## 2015-12-15 DIAGNOSIS — M1712 Unilateral primary osteoarthritis, left knee: Secondary | ICD-10-CM | POA: Diagnosis not present

## 2015-12-19 ENCOUNTER — Ambulatory Visit (INDEPENDENT_AMBULATORY_CARE_PROVIDER_SITE_OTHER): Payer: BLUE CROSS/BLUE SHIELD | Admitting: Physician Assistant

## 2015-12-19 DIAGNOSIS — I10 Essential (primary) hypertension: Secondary | ICD-10-CM | POA: Diagnosis not present

## 2015-12-19 DIAGNOSIS — Z96651 Presence of right artificial knee joint: Secondary | ICD-10-CM

## 2015-12-19 DIAGNOSIS — Z86718 Personal history of other venous thrombosis and embolism: Secondary | ICD-10-CM | POA: Diagnosis not present

## 2015-12-19 DIAGNOSIS — M1712 Unilateral primary osteoarthritis, left knee: Secondary | ICD-10-CM | POA: Diagnosis not present

## 2015-12-19 DIAGNOSIS — Z79891 Long term (current) use of opiate analgesic: Secondary | ICD-10-CM | POA: Diagnosis not present

## 2015-12-19 DIAGNOSIS — Z9079 Acquired absence of other genital organ(s): Secondary | ICD-10-CM | POA: Diagnosis not present

## 2015-12-19 DIAGNOSIS — Z87891 Personal history of nicotine dependence: Secondary | ICD-10-CM | POA: Diagnosis not present

## 2015-12-19 DIAGNOSIS — Z471 Aftercare following joint replacement surgery: Secondary | ICD-10-CM | POA: Diagnosis not present

## 2015-12-19 DIAGNOSIS — Z7982 Long term (current) use of aspirin: Secondary | ICD-10-CM | POA: Diagnosis not present

## 2015-12-19 NOTE — Progress Notes (Signed)
Office Visit Note   Patient: Stephen Maldonado           Date of Birth: 1955/06/23           MRN: [id number] Visit Date: 12/19/2015              Requested by: Josetta Huddle, MD [address] [place], [place] [address] PCP: Henrine Screws, MD   Assessment & Plan: Visit Diagnoses:  1. History of total right knee replacement     Plan: Continue antibiotics. Wash with an antibacterial soap. Moisturizer to right leg. Avoid scratching the leg. Compression hose during the day. Continue to work with therapy for range of motion strengthening of the right leg.  Follow-Up Instructions: Return in about 2 weeks (around 01/02/2016) for wound check.   Orders:  No orders of the defined types were placed in this encounter.  No orders of the defined types were placed in this encounter.     Procedures: No procedures performed   Clinical Data: No additional findings.   Subjective: Chief Complaint  Patient presents with  . Right Knee - Routine Post Op    Good range of motion. States most pain after physical therapy, when up and moving for long periods of time.    Mr. Stephen Maldonado returns today follow-up of his right total knee. States overall that the swelling and redness is dissipating.Marland Kitchen He denies any fevers chills shortness of breath or chest pain. He remains on doxycycline and Cipro is tolerating these well.    Review of Systems See HPI  Objective: Vital Signs: There were no vitals taken for this visit.  Physical Exam  Constitutional: He appears well-developed and well-nourished. No distress.    Ortho Exam Right knee full extension, flexion to approximately 90 no instability with valgus or varus stressing. Calf supple nontender. Slight edema over the anterior tibial spine with very minimal erythema. Surgical incision healing well no signs of dehiscence or drainage.  Specialty Comments:  No specialty comments available.  Imaging: No results found.   PMFS  History: Patient Active Problem List   Diagnosis Date Noted  . Unilateral primary osteoarthritis, right knee 12/01/2015  . Status post total right knee replacement 12/01/2015   Past Medical History:  Diagnosis Date  . Arthritis    knees  . Bladder spasms   . BPH (benign prostatic hyperplasia)   . DVT (deep venous thrombosis) ([place]) ocyt 2016   left leg tx medically  . GERD (gastroesophageal reflux disease)   . History of colon polyps    hyperplastic 06-14-2010  . Hydronephrosis   . Hypertension   . Urinary retention     No family history on file.  Past Surgical History:  Procedure Laterality Date  . COLONOSCOPY  06/14/2010  . CYSTOSCOPY W/ RETROGRADES Bilateral 10/03/2015   Procedure: CYSTOSCOPY WITH RETROGRADE PYELOGRAM;  Surgeon: [name], MD;  [facility];  Service: Urology;  Laterality: Bilateral;  . GREEN LIGHT LASER TURP (TRANSURETHRAL RESECTION OF PROSTATE N/A 10/03/2015   Procedure: GREEN LIGHT LASER,  TURP - STAGED(TRANSURETHRAL RESECTION OF PROSTATE;  Surgeon: [name], MD;  Location: [facility];  Service: Urology;  Laterality: N/A;  . KNEE ARTHROSCOPY     not sure which knee  . QUADRICEPS TENDON REPAIR Bilateral left 02/24/2003/   right 2006  . TOTAL KNEE ARTHROPLASTY Right 12/01/2015   Procedure: RIGHT TOTAL KNEE ARTHROPLASTY;  Surgeon: [name], MD;  Location: [facility];  Service: Orthopedics;  Laterality:  Right;   Social History   Occupational History  . Not on file.   Social History Main Topics  . Smoking status: Never Smoker  . Smokeless tobacco: Former Systems developer    Types: Chew    Quit date: 02/05/1968  . Alcohol use Yes     Comment: 1-2 beers days  . Drug use: No  . Sexual activity: Not on file

## 2015-12-21 DIAGNOSIS — Z7982 Long term (current) use of aspirin: Secondary | ICD-10-CM | POA: Diagnosis not present

## 2015-12-21 DIAGNOSIS — Z86718 Personal history of other venous thrombosis and embolism: Secondary | ICD-10-CM | POA: Diagnosis not present

## 2015-12-21 DIAGNOSIS — Z79891 Long term (current) use of opiate analgesic: Secondary | ICD-10-CM | POA: Diagnosis not present

## 2015-12-21 DIAGNOSIS — Z471 Aftercare following joint replacement surgery: Secondary | ICD-10-CM | POA: Diagnosis not present

## 2015-12-21 DIAGNOSIS — Z96651 Presence of right artificial knee joint: Secondary | ICD-10-CM | POA: Diagnosis not present

## 2015-12-21 DIAGNOSIS — Z87891 Personal history of nicotine dependence: Secondary | ICD-10-CM | POA: Diagnosis not present

## 2015-12-21 DIAGNOSIS — Z9079 Acquired absence of other genital organ(s): Secondary | ICD-10-CM | POA: Diagnosis not present

## 2015-12-21 DIAGNOSIS — I10 Essential (primary) hypertension: Secondary | ICD-10-CM | POA: Diagnosis not present

## 2015-12-21 DIAGNOSIS — M1712 Unilateral primary osteoarthritis, left knee: Secondary | ICD-10-CM | POA: Diagnosis not present

## 2015-12-25 DIAGNOSIS — Z79891 Long term (current) use of opiate analgesic: Secondary | ICD-10-CM | POA: Diagnosis not present

## 2015-12-25 DIAGNOSIS — Z86718 Personal history of other venous thrombosis and embolism: Secondary | ICD-10-CM | POA: Diagnosis not present

## 2015-12-25 DIAGNOSIS — Z471 Aftercare following joint replacement surgery: Secondary | ICD-10-CM | POA: Diagnosis not present

## 2015-12-25 DIAGNOSIS — Z87891 Personal history of nicotine dependence: Secondary | ICD-10-CM | POA: Diagnosis not present

## 2015-12-25 DIAGNOSIS — Z7982 Long term (current) use of aspirin: Secondary | ICD-10-CM | POA: Diagnosis not present

## 2015-12-25 DIAGNOSIS — Z96651 Presence of right artificial knee joint: Secondary | ICD-10-CM | POA: Diagnosis not present

## 2015-12-25 DIAGNOSIS — I10 Essential (primary) hypertension: Secondary | ICD-10-CM | POA: Diagnosis not present

## 2015-12-25 DIAGNOSIS — M1712 Unilateral primary osteoarthritis, left knee: Secondary | ICD-10-CM | POA: Diagnosis not present

## 2015-12-25 DIAGNOSIS — Z9079 Acquired absence of other genital organ(s): Secondary | ICD-10-CM | POA: Diagnosis not present

## 2015-12-27 DIAGNOSIS — Z471 Aftercare following joint replacement surgery: Secondary | ICD-10-CM | POA: Diagnosis not present

## 2015-12-27 DIAGNOSIS — M1712 Unilateral primary osteoarthritis, left knee: Secondary | ICD-10-CM | POA: Diagnosis not present

## 2015-12-27 DIAGNOSIS — Z7982 Long term (current) use of aspirin: Secondary | ICD-10-CM | POA: Diagnosis not present

## 2015-12-27 DIAGNOSIS — Z87891 Personal history of nicotine dependence: Secondary | ICD-10-CM | POA: Diagnosis not present

## 2015-12-27 DIAGNOSIS — Z86718 Personal history of other venous thrombosis and embolism: Secondary | ICD-10-CM | POA: Diagnosis not present

## 2015-12-27 DIAGNOSIS — Z9079 Acquired absence of other genital organ(s): Secondary | ICD-10-CM | POA: Diagnosis not present

## 2015-12-27 DIAGNOSIS — Z96651 Presence of right artificial knee joint: Secondary | ICD-10-CM | POA: Diagnosis not present

## 2015-12-27 DIAGNOSIS — I10 Essential (primary) hypertension: Secondary | ICD-10-CM | POA: Diagnosis not present

## 2015-12-27 DIAGNOSIS — Z79891 Long term (current) use of opiate analgesic: Secondary | ICD-10-CM | POA: Diagnosis not present

## 2016-01-01 DIAGNOSIS — Z87891 Personal history of nicotine dependence: Secondary | ICD-10-CM | POA: Diagnosis not present

## 2016-01-01 DIAGNOSIS — Z9079 Acquired absence of other genital organ(s): Secondary | ICD-10-CM | POA: Diagnosis not present

## 2016-01-01 DIAGNOSIS — I10 Essential (primary) hypertension: Secondary | ICD-10-CM | POA: Diagnosis not present

## 2016-01-01 DIAGNOSIS — Z86718 Personal history of other venous thrombosis and embolism: Secondary | ICD-10-CM | POA: Diagnosis not present

## 2016-01-01 DIAGNOSIS — Z7982 Long term (current) use of aspirin: Secondary | ICD-10-CM | POA: Diagnosis not present

## 2016-01-01 DIAGNOSIS — Z96651 Presence of right artificial knee joint: Secondary | ICD-10-CM | POA: Diagnosis not present

## 2016-01-01 DIAGNOSIS — M1712 Unilateral primary osteoarthritis, left knee: Secondary | ICD-10-CM | POA: Diagnosis not present

## 2016-01-01 DIAGNOSIS — Z471 Aftercare following joint replacement surgery: Secondary | ICD-10-CM | POA: Diagnosis not present

## 2016-01-01 DIAGNOSIS — Z79891 Long term (current) use of opiate analgesic: Secondary | ICD-10-CM | POA: Diagnosis not present

## 2016-01-02 ENCOUNTER — Ambulatory Visit (INDEPENDENT_AMBULATORY_CARE_PROVIDER_SITE_OTHER): Payer: BLUE CROSS/BLUE SHIELD | Admitting: Orthopaedic Surgery

## 2016-01-02 DIAGNOSIS — Z96651 Presence of right artificial knee joint: Secondary | ICD-10-CM

## 2016-01-02 MED ORDER — OXYCODONE-ACETAMINOPHEN 5-325 MG PO TABS: 1.0000 | ORAL_TABLET | Freq: Three times a day (TID) | ORAL | 0 refills | Status: DC | PRN

## 2016-01-02 NOTE — Progress Notes (Signed)
Mr. Khatib is here for follow-up of his right total knee arthroplasty. He is 32 days out from surgery. He is doing well. He is transitioning from home health therapy would like to proceed with outpatient therapy. He lives in the [place] area. We were seeing her today to make sure that his knee on the right side looked good in terms of incision and some redness he had over the anterior shin. He been wearing TED hose well. He reports that he is doing great.  On examination of his right knee he has a mild to moderate fluid collection around the knee itself but this is appropriate postoperative. There is no redness about the knee or his distal tibia either. He has almost painless range of motion from 0 of extension to about 95-100 of flexion. His calf is soft.  At this point we'll send her to outpatient physical therapy with [facility] outpatient therapy in [place] or chronic. See him back in a month to see how he is doing overall. No x-rays needed at that visit. I did refill his pain medication today which was Percocet.

## 2016-01-04 DIAGNOSIS — Z86718 Personal history of other venous thrombosis and embolism: Secondary | ICD-10-CM | POA: Diagnosis not present

## 2016-01-04 DIAGNOSIS — Z471 Aftercare following joint replacement surgery: Secondary | ICD-10-CM | POA: Diagnosis not present

## 2016-01-04 DIAGNOSIS — M1712 Unilateral primary osteoarthritis, left knee: Secondary | ICD-10-CM | POA: Diagnosis not present

## 2016-01-04 DIAGNOSIS — Z9079 Acquired absence of other genital organ(s): Secondary | ICD-10-CM | POA: Diagnosis not present

## 2016-01-04 DIAGNOSIS — Z79891 Long term (current) use of opiate analgesic: Secondary | ICD-10-CM | POA: Diagnosis not present

## 2016-01-04 DIAGNOSIS — Z96651 Presence of right artificial knee joint: Secondary | ICD-10-CM | POA: Diagnosis not present

## 2016-01-04 DIAGNOSIS — Z87891 Personal history of nicotine dependence: Secondary | ICD-10-CM | POA: Diagnosis not present

## 2016-01-04 DIAGNOSIS — Z7982 Long term (current) use of aspirin: Secondary | ICD-10-CM | POA: Diagnosis not present

## 2016-01-04 DIAGNOSIS — I10 Essential (primary) hypertension: Secondary | ICD-10-CM | POA: Diagnosis not present

## 2016-02-01 ENCOUNTER — Ambulatory Visit (INDEPENDENT_AMBULATORY_CARE_PROVIDER_SITE_OTHER): Payer: BLUE CROSS/BLUE SHIELD | Admitting: Orthopaedic Surgery

## 2016-02-08 ENCOUNTER — Ambulatory Visit (INDEPENDENT_AMBULATORY_CARE_PROVIDER_SITE_OTHER): Payer: Self-pay

## 2016-02-08 ENCOUNTER — Encounter (INDEPENDENT_AMBULATORY_CARE_PROVIDER_SITE_OTHER): Payer: Self-pay

## 2016-02-08 ENCOUNTER — Encounter (INDEPENDENT_AMBULATORY_CARE_PROVIDER_SITE_OTHER): Payer: Self-pay | Admitting: Physician Assistant

## 2016-02-08 ENCOUNTER — Ambulatory Visit (INDEPENDENT_AMBULATORY_CARE_PROVIDER_SITE_OTHER): Payer: BLUE CROSS/BLUE SHIELD | Admitting: Physician Assistant

## 2016-02-08 DIAGNOSIS — M1712 Unilateral primary osteoarthritis, left knee: Secondary | ICD-10-CM

## 2016-02-08 DIAGNOSIS — M545 Low back pain, unspecified: Secondary | ICD-10-CM

## 2016-02-08 DIAGNOSIS — Z96651 Presence of right artificial knee joint: Secondary | ICD-10-CM

## 2016-02-08 MED ORDER — METHYLPREDNISOLONE 4 MG PO TABS: ORAL_TABLET | ORAL | 0 refills | Status: DC

## 2016-02-08 NOTE — Progress Notes (Signed)
Office Visit Note   Patient: Stephen Maldonado           Date of Birth: [birth year]           MRN: [id number] Visit Date: 02/08/2016              Requested by: Stephen Huddle, MD [address] [place], [place] [address] PCP: Stephen Screws, MD   Assessment & Plan: Visit Diagnoses:  1. Acute bilateral low back pain without sciatica   2. Status post total right knee replacement   3. Arthritis of left knee     Plan: Place him on a Medrol Dosepak. Doing physical therapy for his back for core strengthening stretching modalities. Offered a cortisone injection left knee he defers. We will keep him out of work and see him back in 3 weeks to see about returning to work at some level.  Follow-Up Instructions: Return in about 3 weeks (around 02/29/2016).   Orders:  Orders Placed This Encounter  Procedures  . XR Lumbar Spine 2-3 Views  . XR Knee 1-2 Views Left   Meds ordered this encounter  Medications  . methylPREDNISolone (MEDROL) 4 MG tablet    Sig: Take as directed    Dispense:  21 tablet    Refill:  0      Procedures: No procedures performed   Clinical Data: No additional findings.   Subjective: Chief Complaint  Patient presents with  . Right Knee - Follow-up    HPI History Stephen Maldonado comes in today with new complaint of low back pain. These low back pain is been ongoing for a long time years but is becoming progressively worsening feels this is due to the way he walks due to his left knee arthritis . No radicular symptoms down either leg .He also is having increased pain in his left knee which she knows he has arthritic changes in the knee. He's just over 2 months status post right total knee arthroplasty in right knee is doing very well Review of Systems   Objective: Vital Signs: There were no vitals taken for this visit.  Physical Exam  Constitutional: He is oriented to person, place, and time. He appears well-developed and well-nourished. No distress.   Cardiovascular: Intact distal pulses.   Neurological: He is alert and oriented to person, place, and time.    Ortho Exam Right knee excellent range of motion without pain no instability. Surgical incision is well-healed. Left knee he has full extension flexion to approximately 110. Tenderness along medial joint line left knee. No effusion no abnormal warmth no erythema.. Lower extremities he has 5 out of 5 strength throughout lower extremities against resistance. Tight hamstrings bilaterally negative straight leg raise bilaterally. Sensation grossly intact bilateral feet to light touch. Specialty Comments:  No specialty comments available.  Imaging: Xr Knee 1-2 Views Left  Result Date: 02/08/2016 Left knee AP lateral views: No acute fracture. Moderate to severe medial compartmental changes mild lateral compartment changes and severe patellofemoral arthritic changes.  Xr Lumbar Spine 2-3 Views  Result Date: 02/08/2016 Lumbar spine AP and lateral views: No acute fractures disc space well-maintained. Minimal degenerative changes. No spondylolisthesis.    PMFS History: Patient Active Problem List   Diagnosis Date Noted  . Unilateral primary osteoarthritis, right knee 12/01/2015  . Status post total right knee replacement 12/01/2015   Past Medical History:  Diagnosis Date  . Arthritis    knees  . Bladder spasms   . BPH (benign prostatic hyperplasia)   .  DVT (deep venous thrombosis) ([place] [place]) ocyt 2016   left leg tx medically  . GERD (gastroesophageal reflux disease)   . History of colon polyps    hyperplastic 06-14-2010  . Hydronephrosis   . Hypertension   . Urinary retention     No family history on file.  Past Surgical History:  Procedure Laterality Date  . COLONOSCOPY  06/14/2010  . CYSTOSCOPY W/ RETROGRADES Bilateral 10/03/2015   Procedure: CYSTOSCOPY WITH RETROGRADE PYELOGRAM;  Surgeon: [name], MD;  Location: [facility];  Service: Urology;   Laterality: Bilateral;  . GREEN LIGHT LASER TURP (TRANSURETHRAL RESECTION OF PROSTATE N/A 10/03/2015   Procedure: GREEN LIGHT LASER,  TURP - STAGED(TRANSURETHRAL RESECTION OF PROSTATE;  Surgeon: [name], MD;  Location: [facility];  Service: Urology;  Laterality: N/A;  . KNEE ARTHROSCOPY     not sure which knee  . QUADRICEPS TENDON REPAIR Bilateral left 02/24/2003/   right 2006  . TOTAL KNEE ARTHROPLASTY Right 12/01/2015   Procedure: RIGHT TOTAL KNEE ARTHROPLASTY;  Surgeon: [name], MD;  Location: [facility];  Service: Orthopedics;  Laterality: Right;   Social History   Occupational History  . Not on file.   Social History Main Topics  . Smoking status: Never Smoker  . Smokeless tobacco: Former Systems developer    Types: Chew    Quit date: 02/05/1968  . Alcohol use Yes     Comment: 1-2 beers days  . Drug use: No  . Sexual activity: Not on file

## 2016-02-13 ENCOUNTER — Encounter: Payer: Self-pay | Admitting: Physical Therapy

## 2016-02-13 ENCOUNTER — Ambulatory Visit: Payer: BLUE CROSS/BLUE SHIELD | Attending: Orthopaedic Surgery | Admitting: Physical Therapy

## 2016-02-13 DIAGNOSIS — G8929 Other chronic pain: Secondary | ICD-10-CM | POA: Diagnosis not present

## 2016-02-13 DIAGNOSIS — M25562 Pain in left knee: Secondary | ICD-10-CM | POA: Insufficient documentation

## 2016-02-13 DIAGNOSIS — M545 Low back pain: Secondary | ICD-10-CM | POA: Insufficient documentation

## 2016-02-13 DIAGNOSIS — R2689 Other abnormalities of gait and mobility: Secondary | ICD-10-CM

## 2016-02-13 NOTE — Therapy (Signed)
[facility] [place], [name], [address] Phone: [phone]   Fax:  [phone]  Physical Therapy Evaluation  Patient Details  Name: Stephen Maldonado MRN: [id number] Date of Birth: 02/21/1955 Referring Provider: [name]  Encounter Date: 02/13/2016      PT End of Session - 02/13/16 0950    Visit Number 1   Number of Visits 16   Date for PT Re-Evaluation 04/09/16   PT Start Time 0950   PT Stop Time 1031   PT Time Calculation (min) 41 min   Activity Tolerance Patient tolerated treatment well   Behavior During Therapy [facility] for tasks assessed/performed      Past Medical History:  Diagnosis Date  . Arthritis    knees  . Bladder spasms   . BPH (benign prostatic hyperplasia)   . DVT (deep venous thrombosis) ([place]) ocyt 2016   left leg tx medically  . GERD (gastroesophageal reflux disease)   . History of colon polyps    hyperplastic 06-14-2010  . Hydronephrosis   . Hypertension   . Urinary retention     Past Surgical History:  Procedure Laterality Date  . COLONOSCOPY  06/14/2010  . CYSTOSCOPY W/ RETROGRADES Bilateral 10/03/2015   Procedure: CYSTOSCOPY WITH RETROGRADE PYELOGRAM;  Surgeon: [name], MD;  Location: [facility];  Service: Urology;  Laterality: Bilateral;  . GREEN LIGHT LASER TURP (TRANSURETHRAL RESECTION OF PROSTATE N/A 10/03/2015   Procedure: GREEN LIGHT LASER,  TURP - STAGED(TRANSURETHRAL RESECTION OF PROSTATE;  Surgeon: [name], MD;  Location: [facility];  Service: Urology;  Laterality: N/A;  . KNEE ARTHROSCOPY     not sure which knee  . QUADRICEPS TENDON REPAIR Bilateral left 02/24/2003/   right 2006  . TOTAL KNEE ARTHROPLASTY Right 12/01/2015   Procedure: RIGHT TOTAL KNEE ARTHROPLASTY;  Surgeon: [name], MD;  Location: [facility];  Service: Orthopedics;  Laterality: Right;    There were no vitals filed for this visit.        Subjective Assessment - 02/13/16 0953    Subjective Patient states both his knees have been bad and the R has been replaced. Patient reports that his back has been hurting many years secondary to his knees being bad. He reports pain with prolonged standing, weedeating and washing dishes are the worst.   Pertinent History R knee TKR 12/01/15, HTN   Limitations Standing   How long can you stand comfortably? 10 min   Diagnostic tests xrays   Patient Stated Goals to get rid of pain   Currently in Pain? Yes   Pain Score 4    Pain Location Back   Pain Orientation Right;Lower   Pain Descriptors / Indicators Dull   Pain Type Chronic pain   Pain Onset More than a month ago   Pain Frequency Intermittent   Aggravating Factors  prolonged standing, weedeating, washing dishes, , walking and carrying things.   Pain Relieving Factors sitting sometimes, rest   Effect of Pain on Daily Activities limited with ADLs            [facility] PT Assessment - 02/13/16 0001      Assessment   Medical Diagnosis low back pain   Referring Provider [name]   Onset Date/Surgical Date 11/05/15   Next MD Visit 02/29/16     Precautions   Precautions None     Restrictions   Weight Bearing Restrictions No     Balance Screen   Has the patient  fallen in the past 6 months No   Has the patient had a decrease in activity level because of a fear of falling?  Yes   Is the patient reluctant to leave their home because of a fear of falling?  No     Home Environment   Living Environment Private residence   Living Arrangements Spouse/significant other   Type of [place] to enter   Entrance Stairs-Number of Steps 3   Entrance Stairs-Rails None   [place] or work area in basement     Prior Function   Level of Independence Independent     Posture/Postural Control   Posture/Postural Control Postural limitations   Postural Limitations Anterior pelvic tilt;Weight shift  right  left hip ER     ROM / Strength   AROM / PROM / Strength AROM;Strength     AROM   AROM Assessment Site Lumbar   Lumbar Flexion full   Lumbar Extension limited 50%  tight; full extension in prone   Lumbar - Right Side Bend equal to left   Lumbar - Left Side Bend pain on R   Lumbar - Right Rotation full   Lumbar - Left Rotation decreased 15%     Strength   Overall Strength Comments left hip flex 4+/5, else 5/5.     Flexibility   Soft Tissue Assessment /Muscle Length --  tight R QL, tight quads B to 90 deg prone     Palpation   Spinal mobility decreased mobility at L4/5 B with pain B   Palpation comment tender in R QL and lumbar paraspinals     Special Tests    Special Tests --  neg SLR and slump                           PT Education - 02/13/16 1034    Education provided Yes   Education Details HEP; multiple stretches attempted for QL   Person(s) Educated Patient   Methods Explanation;Demonstration;Handout  hand drawn   Comprehension Verbalized understanding;Returned demonstration             PT Long Term Goals - 02/13/16 1605      PT LONG TERM GOAL #1   Title I with HEP   Time 8   Period Weeks   Status New     PT LONG TERM GOAL #2   Title Patient able to perform ADLS with LBP [id number] or less.   Time 8   Period Weeks   Status New     PT LONG TERM GOAL #3   Title Patient able to stand for 30 minutes comfortably.   Time 8   Period Weeks   Status New     PT LONG TERM GOAL #4   Title Patient able to ambulate without R trunk lean.   Time 8   Period Weeks   Status New               Plan - 02/13/16 1035    Clinical Impression Statement Patient presents with c/o chronic R sided LBP which likely began with his abnormal gait pattern due to B arthritic knees. He ambulates with R trunk lean and decreased stance time on the left due to left knee pain.  He has limited ROM in standing and decreased lumbar mobility B.    Rehab  Potential Good   Clinical Impairments Affecting Rehab Potential chronic Left knee  pain   PT Frequency 2x / week   PT Duration 8 weeks   PT Treatment/Interventions ADLs/Self Care Home Management;Cryotherapy;Electrical Stimulation;Moist Heat;Ultrasound;Gait training;Patient/family education;Neuromuscular re-education;Therapeutic exercise;Manual techniques;Dry needling   PT Next Visit Plan STW/manual therapy to Lumbar, PA mobs, core strengthening, gait training; DN if acceptable to patient for back and knee pain.   PT Home Exercise Plan prone press ups; standing QL stretch   Consulted and Agree with Plan of Care Patient      Patient will benefit from skilled therapeutic intervention in order to improve the following deficits and impairments:  Abnormal gait, Decreased range of motion, Pain, Decreased strength  Visit Diagnosis: Chronic right-sided low back pain without sciatica - Plan: PT plan of care cert/re-cert  Other abnormalities of gait and mobility - Plan: PT plan of care cert/re-cert  Chronic pain of left knee - Plan: PT plan of care cert/re-cert     Problem List Patient Active Problem List   Diagnosis Date Noted  . Unilateral primary osteoarthritis, right knee 12/01/2015  . Status post total right knee replacement 12/01/2015    Madelyn Flavors PT 02/13/2016, 4:14 PM  [facility] [place], [name], [address] Phone: [phone]   Fax:  [phone]  Name: Demoni Haenel MRN: [id number] Date of Birth: [birth year]

## 2016-02-15 ENCOUNTER — Ambulatory Visit: Payer: BLUE CROSS/BLUE SHIELD | Admitting: Physical Therapy

## 2016-02-15 ENCOUNTER — Encounter: Payer: Self-pay | Admitting: Physical Therapy

## 2016-02-15 DIAGNOSIS — M545 Low back pain, unspecified: Secondary | ICD-10-CM

## 2016-02-15 DIAGNOSIS — G8929 Other chronic pain: Secondary | ICD-10-CM | POA: Diagnosis not present

## 2016-02-15 DIAGNOSIS — R2689 Other abnormalities of gait and mobility: Secondary | ICD-10-CM

## 2016-02-15 DIAGNOSIS — M25562 Pain in left knee: Secondary | ICD-10-CM | POA: Diagnosis not present

## 2016-02-15 NOTE — Therapy (Signed)
[facility] [place], [name], [address] Phone: [phone]   Fax:  [phone]  Physical Therapy Treatment  Patient Details  Name: Burness Kehn MRN: [id number] Date of Birth: [birth year] Referring Provider: [name]  Encounter Date: 02/15/2016      PT End of Session - 02/15/16 1035    Visit Number 2   Number of Visits 16   Date for PT Re-Evaluation 04/09/16   PT Start Time 0946   PT Stop Time 1044   PT Time Calculation (min) 58 min   Activity Tolerance Patient tolerated treatment well   Behavior During Therapy [facility] for tasks assessed/performed      Past Medical History:  Diagnosis Date  . Arthritis    knees  . Bladder spasms   . BPH (benign prostatic hyperplasia)   . DVT (deep venous thrombosis) ([place]) ocyt 2016   left leg tx medically  . GERD (gastroesophageal reflux disease)   . History of colon polyps    hyperplastic 06-14-2010  . Hydronephrosis   . Hypertension   . Urinary retention     Past Surgical History:  Procedure Laterality Date  . COLONOSCOPY  06/14/2010  . CYSTOSCOPY W/ RETROGRADES Bilateral 10/03/2015   Procedure: CYSTOSCOPY WITH RETROGRADE PYELOGRAM;  Surgeon: [name], MD;  Location: [facility];  Service: Urology;  Laterality: Bilateral;  . GREEN LIGHT LASER TURP (TRANSURETHRAL RESECTION OF PROSTATE N/A 10/03/2015   Procedure: GREEN LIGHT LASER,  TURP - STAGED(TRANSURETHRAL RESECTION OF PROSTATE;  Surgeon: [name], MD;  Location: [facility];  Service: Urology;  Laterality: N/A;  . KNEE ARTHROSCOPY     not sure which knee  . QUADRICEPS TENDON REPAIR Bilateral left 02/24/2003/   right 2006  . TOTAL KNEE ARTHROPLASTY Right 12/01/2015   Procedure: RIGHT TOTAL KNEE ARTHROPLASTY;  Surgeon: [name], MD;  Location: [facility];  Service: Orthopedics;  Laterality: Right;    There were no vitals filed for this visit.       Subjective Assessment - 02/15/16 0959    Subjective Patient reported soreness after doing yard work yesterday   Pertinent History R knee TKR 12/01/15, HTN   Limitations Standing   How long can you stand comfortably? 10 min   Diagnostic tests xrays   Patient Stated Goals to get rid of pain   Currently in Pain? Yes   Pain Score 4    Pain Location Back   Pain Orientation Right;Lower   Pain Descriptors / Indicators Aching;Sore   Pain Type Chronic pain   Pain Onset More than a month ago   Pain Frequency Intermittent   Aggravating Factors  prolong standing and increased activity   Pain Relieving Factors at rest                         [facility] Adult PT Treatment/Exercise - 02/15/16 0001      Exercises   Exercises Lumbar     Lumbar Exercises: Aerobic   Stationary Bike nustep L5 x70min core posture focus     Lumbar Exercises: Supine   Ab Set 3 seconds  2x10   Bent Knee Raise 3 seconds  2x10   Bridge 3 seconds;10 reps   Straight Leg Raise 3 seconds  2x10     Modalities   Modalities Electrical Stimulation;Moist Heat     Moist Heat Therapy   Number Minutes Moist Heat 15 Minutes   Moist Heat Location Lumbar Spine  patient  sitting     Electrical Stimulation   Electrical Stimulation Location right low back   Electrical Stimulation Action IFC   Electrical Stimulation Parameters 80-150hz  x57min   Electrical Stimulation Goals Pain     Manual Therapy   Manual Therapy Soft tissue mobilization;Myofascial release   Soft tissue mobilization manual STW to right low back paraspinals and muscles and SI area of pain, patient sitting                 PT Education - 02/15/16 1038    Education provided Yes   Education Details HEP   Person(s) Educated Patient   Methods Explanation;Demonstration;Handout   Comprehension Verbalized understanding;Returned demonstration             PT Long Term Goals - 02/15/16 1036      PT LONG TERM GOAL #1   Title I with  HEP   Time 8   Period Weeks   Status On-going     PT LONG TERM GOAL #2   Title Patient able to perform ADLS with LBP [id number] or less.   Time 8   Period Weeks   Status On-going     PT LONG TERM GOAL #3   Title Patient able to stand for 30 minutes comfortably.   Time 8   Period Weeks   Status On-going     PT LONG TERM GOAL #4   Title Patient able to ambulate without R trunk lean.   Time 8   Period Weeks   Status On-going               Plan - 02/15/16 1038    Clinical Impression Statement Patient tolerated treatment well today and had no repored increased pain. Today educated patient on posture awareness techniques and HEP for draw in / core activation techniques. Patient had a good understanding of core strengthening. Patient had palpable pain in right low back / SI area today. Patient has more pain with prolong standing activity.  Patient current goals ongoing due to pain deficits.   Rehab Potential Good   Clinical Impairments Affecting Rehab Potential chronic Left knee pain   PT Frequency 2x / week   PT Duration 8 weeks   PT Treatment/Interventions ADLs/Self Care Home Management;Cryotherapy;Electrical Stimulation;Moist Heat;Ultrasound;Gait training;Patient/family education;Neuromuscular re-education;Therapeutic exercise;Manual techniques;Dry needling   PT Next Visit Plan STW/manual therapy to Lumbar, PA mobs, core strengthening, gait training; DN if acceptable to patient for back and knee pain.   Consulted and Agree with Plan of Care Patient      Patient will benefit from skilled therapeutic intervention in order to improve the following deficits and impairments:  Abnormal gait, Decreased range of motion, Pain, Decreased strength  Visit Diagnosis: Chronic right-sided low back pain without sciatica  Chronic pain of left knee  Other abnormalities of gait and mobility     Problem List Patient Active Problem List   Diagnosis Date Noted  . Unilateral primary  osteoarthritis, right knee 12/01/2015  . Status post total right knee replacement 12/01/2015    Phillips Climes, PTA 02/15/2016, 10:46 AM  [facility] [place], [name], [address] Phone: [phone]   Fax:  [phone]  Name: Aryash Vanous MRN: [id number] Date of Birth: Feb 17, 1955

## 2016-02-15 NOTE — Patient Instructions (Signed)
Pelvic Tilt: Posterior - Legs Bent (Supine)   Tighten stomach and flatten back by rolling pelvis down. Hold _10___ seconds. Relax. Repeat _10-30___ times per set. Do __2__ sets per session. Do _2___ sessions per day.   . Straight Leg Raise   Tighten stomach and slowly raise locked right leg __4__ inches from floor. Repeat __10-30__ times per set. Do __2__ sets per session. Do __2__ sessions per day.   Bent Leg Lift (Hook-Lying)   Tighten stomach and slowly raise right leg _5___ inches from floor. Keep trunk rigid. Hold _3___ seconds. Repeat _10___ times per set. Do ___2-3_ sets per session. Do __2__ sessions per day.    

## 2016-02-19 ENCOUNTER — Encounter: Payer: Self-pay | Admitting: Physical Therapy

## 2016-02-19 ENCOUNTER — Ambulatory Visit: Payer: BLUE CROSS/BLUE SHIELD | Admitting: Physical Therapy

## 2016-02-19 DIAGNOSIS — M25562 Pain in left knee: Secondary | ICD-10-CM | POA: Diagnosis not present

## 2016-02-19 DIAGNOSIS — G8929 Other chronic pain: Secondary | ICD-10-CM

## 2016-02-19 DIAGNOSIS — M545 Low back pain, unspecified: Secondary | ICD-10-CM

## 2016-02-19 DIAGNOSIS — R2689 Other abnormalities of gait and mobility: Secondary | ICD-10-CM

## 2016-02-19 NOTE — Therapy (Signed)
[facility] [place], [name], [address] Phone: [phone]   Fax:  [phone]  Physical Therapy Treatment  Patient Details  Name: Stephen Maldonado MRN: [id number] Date of Birth: 1955-11-22 Referring Provider: [name]  Encounter Date: 02/19/2016      PT End of Session - 02/19/16 0946    Visit Number 3   Number of Visits 16   Date for PT Re-Evaluation 04/09/16   PT Start Time 0947   PT Stop Time 1036   PT Time Calculation (min) 49 min   Activity Tolerance Patient tolerated treatment well   Behavior During Therapy [facility] for tasks assessed/performed      Past Medical History:  Diagnosis Date  . Arthritis    knees  . Bladder spasms   . BPH (benign prostatic hyperplasia)   . DVT (deep venous thrombosis) ([place]) ocyt 2016   left leg tx medically  . GERD (gastroesophageal reflux disease)   . History of colon polyps    hyperplastic 06-14-2010  . Hydronephrosis   . Hypertension   . Urinary retention     Past Surgical History:  Procedure Laterality Date  . COLONOSCOPY  06/14/2010  . CYSTOSCOPY W/ RETROGRADES Bilateral 10/03/2015   Procedure: CYSTOSCOPY WITH RETROGRADE PYELOGRAM;  Surgeon: [name], MD;  Location: [facility];  Service: Urology;  Laterality: Bilateral;  . GREEN LIGHT LASER TURP (TRANSURETHRAL RESECTION OF PROSTATE N/A 10/03/2015   Procedure: GREEN LIGHT LASER,  TURP - STAGED(TRANSURETHRAL RESECTION OF PROSTATE;  Surgeon: [name], MD;  Location: [facility];  Service: Urology;  Laterality: N/A;  . KNEE ARTHROSCOPY     not sure which knee  . QUADRICEPS TENDON REPAIR Bilateral left 02/24/2003/   right 2006  . TOTAL KNEE ARTHROPLASTY Right 12/01/2015   Procedure: RIGHT TOTAL KNEE ARTHROPLASTY;  Surgeon: [name], MD;  Location: [facility];  Service: Orthopedics;  Laterality: Right;    There were no vitals filed for this visit.       Subjective Assessment - 02/19/16 0946    Subjective Reports that his knee is bothering him but his back is still hurting with all activities such as sitting or standing.   Pertinent History R knee TKR 12/01/15, HTN   Limitations Standing   How long can you stand comfortably? 10 min   Diagnostic tests xrays   Patient Stated Goals to get rid of pain   Currently in Pain? Yes   Pain Score 5    Pain Location Knee   Pain Orientation Left   Pain Descriptors / Indicators Jabbing   Pain Type Chronic pain   Pain Onset More than a month ago   Pain Frequency Intermittent   Aggravating Factors  Activity            [facility] PT Assessment - 02/19/16 0001      Assessment   Medical Diagnosis low back pain   Onset Date/Surgical Date 11/05/15   Next MD Visit 02/29/16     Precautions   Precautions None     Restrictions   Weight Bearing Restrictions No                     [facility] Adult PT Treatment/Exercise - 02/19/16 0001      Exercises   Exercises Lumbar;Knee/Hip     Lumbar Exercises: Aerobic   Stationary Bike NuStep L5 x10 min with VCs for core activation     Lumbar Exercises: Supine   Ab Set  20 reps;5 seconds   Clam 20 reps   Bent Knee Raise 20 reps   Bridge 15 reps;3 seconds   Straight Leg Raise 10 reps   Straight Leg Raises Limitations BLE     Knee/Hip Exercises: Seated   Long Arc Quad Other (comment)  Attempted with 3# weight but had increased pain with ext     Modalities   Modalities Electrical Stimulation;Moist Heat     Moist Heat Therapy   Number Minutes Moist Heat 15 Minutes   Moist Heat Location Lumbar Spine     Electrical Stimulation   Electrical Stimulation Location R low back   Electrical Stimulation Action Pre-Mod   Electrical Stimulation Parameters 80-150 hz x15 min   Electrical Stimulation Goals Pain     Manual Therapy   Manual Therapy Soft tissue mobilization   Soft tissue mobilization STW to R lumbar paraspinals/ QL/ SI joint in L  sidelying to decrease pain and tightness                     PT Long Term Goals - 02/15/16 1036      PT LONG TERM GOAL #1   Title I with HEP   Time 8   Period Weeks   Status On-going     PT LONG TERM GOAL #2   Title Patient able to perform ADLS with LBP [id number] or less.   Time 8   Period Weeks   Status On-going     PT LONG TERM GOAL #3   Title Patient able to stand for 30 minutes comfortably.   Time 8   Period Weeks   Status On-going     PT LONG TERM GOAL #4   Title Patient able to ambulate without R trunk lean.   Time 8   Period Weeks   Status On-going               Plan - 02/19/16 1028    Clinical Impression Statement Patient arrived to clinic with reported L knee pain that limited knee strengthening secondary to pain with L knee extension. Patient encouraged to complete NuStep as well as supine core strengthening exercises with core activation. Patient demonstrated BLE extensor lag with BLE SLR today. Soreness still reported along R SI joint with palpation and manual therapy. Normal modalities response following removal of the modalities although he experienced difficulty finding comfortable position for his back in supine.   Rehab Potential Good   Clinical Impairments Affecting Rehab Potential chronic Left knee pain   PT Frequency 2x / week   PT Duration 8 weeks   PT Treatment/Interventions ADLs/Self Care Home Management;Cryotherapy;Electrical Stimulation;Moist Heat;Ultrasound;Gait training;Patient/family education;Neuromuscular re-education;Therapeutic exercise;Manual techniques;Dry needling   PT Next Visit Plan STW/manual therapy to Lumbar, PA mobs, core strengthening, gait training; DN if acceptable to patient for back and knee pain.   PT Home Exercise Plan prone press ups; standing QL stretch   Consulted and Agree with Plan of Care Patient      Patient will benefit from skilled therapeutic intervention in order to improve the following deficits  and impairments:  Abnormal gait, Decreased range of motion, Pain, Decreased strength  Visit Diagnosis: Chronic right-sided low back pain without sciatica  Chronic pain of left knee  Other abnormalities of gait and mobility     Problem List Patient Active Problem List   Diagnosis Date Noted  . Unilateral primary osteoarthritis, right knee 12/01/2015  . Status post total right knee replacement 12/01/2015    Merleen Nicely  Volney American, PTA 02/19/2016, 10:44 AM  [facility] [address] [place], [name], [address] Phone: [phone]   Fax:  [phone]  Name: Stephen Maldonado MRN: [id number] Date of Birth: 07-06-1955

## 2016-02-21 ENCOUNTER — Encounter: Payer: BLUE CROSS/BLUE SHIELD | Admitting: Physical Therapy

## 2016-02-26 ENCOUNTER — Ambulatory Visit: Payer: BLUE CROSS/BLUE SHIELD | Admitting: Physical Therapy

## 2016-02-26 ENCOUNTER — Encounter: Payer: Self-pay | Admitting: Physical Therapy

## 2016-02-26 DIAGNOSIS — G8929 Other chronic pain: Secondary | ICD-10-CM | POA: Diagnosis not present

## 2016-02-26 DIAGNOSIS — M25562 Pain in left knee: Secondary | ICD-10-CM | POA: Diagnosis not present

## 2016-02-26 DIAGNOSIS — M545 Low back pain, unspecified: Secondary | ICD-10-CM

## 2016-02-26 DIAGNOSIS — R2689 Other abnormalities of gait and mobility: Secondary | ICD-10-CM

## 2016-02-26 NOTE — Therapy (Signed)
[facility] [place], [name], [address] Phone: [phone]   Fax:  [phone]  Physical Therapy Treatment  Patient Details  Name: Stephen Maldonado MRN: [id number] Date of Birth: [birth year] Referring Provider: [name]  Encounter Date: 02/26/2016      PT End of Session - 02/26/16 1029    Visit Number 4   Number of Visits 16   Date for PT Re-Evaluation 04/09/16   PT Start Time 0944   PT Stop Time 1040   PT Time Calculation (min) 56 min   Activity Tolerance Patient tolerated treatment well   Behavior During Therapy [facility] for tasks assessed/performed      Past Medical History:  Diagnosis Date  . Arthritis    knees  . Bladder spasms   . BPH (benign prostatic hyperplasia)   . DVT (deep venous thrombosis) ([place]) ocyt 2016   left leg tx medically  . GERD (gastroesophageal reflux disease)   . History of colon polyps    hyperplastic 06-14-2010  . Hydronephrosis   . Hypertension   . Urinary retention     Past Surgical History:  Procedure Laterality Date  . COLONOSCOPY  06/14/2010  . CYSTOSCOPY W/ RETROGRADES Bilateral 10/03/2015   Procedure: CYSTOSCOPY WITH RETROGRADE PYELOGRAM;  Surgeon: [name], MD;  Location: [facility];  Service: Urology;  Laterality: Bilateral;  . GREEN LIGHT LASER TURP (TRANSURETHRAL RESECTION OF PROSTATE N/A 10/03/2015   Procedure: GREEN LIGHT LASER,  TURP - STAGED(TRANSURETHRAL RESECTION OF PROSTATE;  Surgeon: [name], MD;  Location: [facility];  Service: Urology;  Laterality: N/A;  . KNEE ARTHROSCOPY     not sure which knee  . QUADRICEPS TENDON REPAIR Bilateral left 02/24/2003/   right 2006  . TOTAL KNEE ARTHROPLASTY Right 12/01/2015   Procedure: RIGHT TOTAL KNEE ARTHROPLASTY;  Surgeon: [name], MD;  [facility];  Service: Orthopedics;  Laterality: Right;    There were no vitals filed for this visit.       Subjective Assessment - 02/26/16 0946    Subjective Patient reported increased pain in bil low back   Pertinent History R knee TKR 12/01/15, HTN   Limitations Standing   How long can you stand comfortably? 10 min   Diagnostic tests xrays   Patient Stated Goals to get rid of pain   Currently in Pain? Yes   Pain Score 6    Pain Location Back   Pain Orientation Right;Left;Lower  right more pain than left   Pain Descriptors / Indicators Sharp;Aching;Sore   Pain Type Chronic pain   Pain Onset More than a month ago   Pain Frequency Intermittent   Aggravating Factors  prolong activity   Pain Relieving Factors at rest                         [facility] Adult PT Treatment/Exercise - 02/26/16 0001      Modalities   Modalities Electrical Stimulation;Moist Heat;Ultrasound     Moist Heat Therapy   Number Minutes Moist Heat 15 Minutes   Moist Heat Location Lumbar Spine     Electrical Stimulation   Electrical Stimulation Location bil low back   Electrical Stimulation Action IFC   Electrical Stimulation Parameters 80-150hz  x29min   Electrical Stimulation Goals Pain     Ultrasound   Ultrasound Location bil low back   Ultrasound Parameters 1.5w/cm2/50%/23mhzx10min   Ultrasound Goals Pain     Manual Therapy   Manual  Therapy Soft tissue mobilization   Soft tissue mobilization STW to bil lumbar paraspinals/ QL/ SI joint in L sidelying to decrease pain and tightness                     PT Long Term Goals - 02/15/16 1036      PT LONG TERM GOAL #1   Title I with HEP   Time 8   Period Weeks   Status On-going     PT LONG TERM GOAL #2   Title Patient able to perform ADLS with LBP [id number] or less.   Time 8   Period Weeks   Status On-going     PT LONG TERM GOAL #3   Title Patient able to stand for 30 minutes comfortably.   Time 8   Period Weeks   Status On-going     PT LONG TERM GOAL #4   Title Patient able to ambulate without R trunk lean.   Time 8    Period Weeks   Status On-going               Plan - 02/26/16 1030    Clinical Impression Statement Patient reported increased pain in low back today more in right than left. Patient requested no exercise due to pain today. Patient has ongoing increased with any prolong activity. Patient felt better after treatment today. Patient unable to meet any further goals due to pain deficts.    Rehab Potential Good   Clinical Impairments Affecting Rehab Potential chronic Left knee pain   PT Frequency 2x / week   PT Duration 8 weeks   PT Treatment/Interventions ADLs/Self Care Home Management;Cryotherapy;Electrical Stimulation;Moist Heat;Ultrasound;Gait training;Patient/family education;Neuromuscular re-education;Therapeutic exercise;Manual techniques;Dry needling   PT Next Visit Plan cont with POC  (MD. Ninfa Linden 01/28/17)   Consulted and Agree with Plan of Care Patient      Patient will benefit from skilled therapeutic intervention in order to improve the following deficits and impairments:  Abnormal gait, Decreased range of motion, Pain, Decreased strength  Visit Diagnosis: Chronic right-sided low back pain without sciatica  Chronic pain of left knee  Other abnormalities of gait and mobility     Problem List Patient Active Problem List   Diagnosis Date Noted  . Unilateral primary osteoarthritis, right knee 12/01/2015  . Status post total right knee replacement 12/01/2015    Phillips Climes, PTA 02/26/2016, 10:45 AM  [facility] [place], [name], [address] Phone: [phone]   Fax:  [phone]  Name: Stephen Maldonado MRN: [id number] Date of Birth: 1955-09-22

## 2016-02-28 ENCOUNTER — Encounter: Payer: Self-pay | Admitting: Physical Therapy

## 2016-02-28 ENCOUNTER — Ambulatory Visit: Payer: BLUE CROSS/BLUE SHIELD | Admitting: Physical Therapy

## 2016-02-28 DIAGNOSIS — G8929 Other chronic pain: Secondary | ICD-10-CM | POA: Diagnosis not present

## 2016-02-28 DIAGNOSIS — R2689 Other abnormalities of gait and mobility: Secondary | ICD-10-CM | POA: Diagnosis not present

## 2016-02-28 DIAGNOSIS — M25562 Pain in left knee: Secondary | ICD-10-CM

## 2016-02-28 DIAGNOSIS — M545 Low back pain: Principal | ICD-10-CM

## 2016-02-28 NOTE — Therapy (Signed)
[name], [name], [address] Phone: [phone]   Fax:  [phone]  Physical Therapy Treatment  Patient Details  Name: Stephen Maldonado MRN: [id number] Date of Birth: [birth year] Referring Provider: [name]  Encounter Date: 02/28/2016      PT End of Session - 02/28/16 1028    Visit Number 5   Number of Visits 16   Date for PT Re-Evaluation 04/09/16   PT Start Time 0946   PT Stop Time 1043   PT Time Calculation (min) 57 min   Activity Tolerance Patient tolerated treatment well   Behavior During Therapy [facility] for tasks assessed/performed      Past Medical History:  Diagnosis Date  . Arthritis    knees  . Bladder spasms   . BPH (benign prostatic hyperplasia)   . DVT (deep venous thrombosis) ([place]) ocyt 2016   left leg tx medically  . GERD (gastroesophageal reflux disease)   . History of colon polyps    hyperplastic 06-14-2010  . Hydronephrosis   . Hypertension   . Urinary retention     Past Surgical History:  Procedure Laterality Date  . COLONOSCOPY  06/14/2010  . CYSTOSCOPY W/ RETROGRADES Bilateral 10/03/2015   Procedure: CYSTOSCOPY WITH RETROGRADE PYELOGRAM;  Surgeon: [name], MD;  Location: [facility];  Service: Urology;  Laterality: Bilateral;  . GREEN LIGHT LASER TURP (TRANSURETHRAL RESECTION OF PROSTATE N/A 10/03/2015   Procedure: GREEN LIGHT LASER,  TURP - STAGED(TRANSURETHRAL RESECTION OF PROSTATE;  Surgeon: [name], MD;  Location: [facility];  Service: Urology;  Laterality: N/A;  . KNEE ARTHROSCOPY     not sure which knee  . QUADRICEPS TENDON REPAIR Bilateral left 02/24/2003/   right 2006  . TOTAL KNEE ARTHROPLASTY Right 12/01/2015   Procedure: RIGHT TOTAL KNEE ARTHROPLASTY;  Surgeon: [name], MD;  Location: [facility];  Service: Orthopedics;  Laterality: Right;    There were no vitals filed for this visit.       Subjective Assessment - 02/28/16 0947    Subjective Patient arrived with complaints of ongoing pain in low back and in knee   Pertinent History R knee TKR 12/01/15, HTN   Limitations Standing   How long can you stand comfortably? 10 min   Diagnostic tests xrays   Patient Stated Goals to get rid of pain   Currently in Pain? Yes   Pain Score 3    Pain Location Back  right knee   Pain Orientation Right;Left;Lower   Pain Descriptors / Indicators Sharp;Aching;Sore   Pain Type Chronic pain   Pain Onset More than a month ago   Pain Frequency Intermittent   Aggravating Factors  prolong standing, walking or carring   Pain Relieving Factors at rest                         [facility] Adult PT Treatment/Exercise - 02/28/16 0001      Moist Heat Therapy   Number Minutes Moist Heat 15 Minutes   Moist Heat Location Lumbar Spine     Electrical Stimulation   Electrical Stimulation Location bil low back   Electrical Stimulation Action IFC   Electrical Stimulation Parameters 80-_0  x60mn   Electrical Stimulation Goals Pain     Ultrasound   Ultrasound Location bil low back paraspinals   Ultrasound Parameters 1.5w/cm2/50%/146m x10   Ultrasound Goals Pain     Manual Therapy   Manual Therapy Soft tissue mobilization  Soft tissue mobilization STW to bil lumbar paraspinals/ QL/ SI joint in L sidelying to decrease pain and tightness                     PT Long Term Goals - 02/28/16 0950      PT LONG TERM GOAL #1   Title I with HEP   Time 8   Period Weeks   Status Achieved  doing HEP as tolerated 02/28/16     PT LONG TERM GOAL #2   Title Patient able to perform ADLS with LBP 7/01 or less.   Time 8   Period Weeks   Status On-going  7-8/10 pain with ADL's     PT LONG TERM GOAL #3   Title Patient able to stand for 30 minutes comfortably.   Period Weeks   Status On-going  10 min at a time 02/28/16     PT LONG TERM GOAL #4   Title Patient able to ambulate  without R trunk lean.   Time 8   Period Weeks   Status On-going  only able to stand upright posture partial on level surface 02/28/16               Plan - 02/28/16 1029    Clinical Impression Statement Patient tolerated treatment well today. Patient has reported ongoing pain in bil low back and esp on right side with palpable pain in SI/Glut area. Patient has more pain complaints in right knee. Patient unable to perform ADL's or stand for prolong time due to pain increase. Patient has reported relief after therapy yet pain returns after any prolong weight bearing activities. Patient able to perorm his HEP as tolerated. Patient understands posture techniques to avoid re-injury. Patient met LTG #1 others ongoing due to pain deficts.   Rehab Potential Good   Clinical Impairments Affecting Rehab Potential chronic Left knee pain   PT Frequency 2x / week   PT Duration 8 weeks   PT Treatment/Interventions ADLs/Self Care Home Management;Cryotherapy;Electrical Stimulation;Moist Heat;Ultrasound;Gait training;Patient/family education;Neuromuscular re-education;Therapeutic exercise;Manual techniques;Dry needling   PT Next Visit Plan cont with POC per MD discression  (MD. Ninfa Linden 01/28/17)   Consulted and Agree with Plan of Care Patient      Patient will benefit from skilled therapeutic intervention in order to improve the following deficits and impairments:  Abnormal gait, Decreased range of motion, Pain, Decreased strength  Visit Diagnosis: Chronic right-sided low back pain without sciatica  Chronic pain of left knee  Other abnormalities of gait and mobility     Problem List Patient Active Problem List   Diagnosis Date Noted  . Unilateral primary osteoarthritis, right knee 12/01/2015  . Status post total right knee replacement 12/01/2015    Stephen Maldonado, PTA 02/28/16 11:34 AM [name] MPT [facility] [place], [name], [address] Phone: [phone]   Fax:  [phone]  Name: Stephen Maldonado MRN: [id number] Date of Birth: Apr 29, 1955 PHYSICAL THERAPY DISCHARGE SUMMARY  Visits from Start of Care: 5.  Current functional level related to goals / functional outcomes: See above.   Remaining deficits: Goals not met.   Education / Equipment: HEP. Plan: Patient agrees to discharge.  Patient goals were not met. Patient is being discharged due to lack of progress.  ?????         [name] MPT.

## 2016-02-29 ENCOUNTER — Ambulatory Visit (INDEPENDENT_AMBULATORY_CARE_PROVIDER_SITE_OTHER): Payer: BLUE CROSS/BLUE SHIELD | Admitting: Orthopaedic Surgery

## 2016-02-29 DIAGNOSIS — M25562 Pain in left knee: Secondary | ICD-10-CM | POA: Diagnosis not present

## 2016-02-29 DIAGNOSIS — G8929 Other chronic pain: Secondary | ICD-10-CM

## 2016-02-29 DIAGNOSIS — M1712 Unilateral primary osteoarthritis, left knee: Secondary | ICD-10-CM

## 2016-02-29 DIAGNOSIS — Z96651 Presence of right artificial knee joint: Secondary | ICD-10-CM

## 2016-02-29 DIAGNOSIS — R972 Elevated prostate specific antigen [PSA]: Secondary | ICD-10-CM | POA: Diagnosis not present

## 2016-02-29 MED ORDER — METHYLPREDNISOLONE ACETATE 40 MG/ML IJ SUSP
40.0000 mg | INTRAMUSCULAR | Status: AC | PRN
  Administered 2016-02-29: 40 mg via INTRA_ARTICULAR

## 2016-02-29 MED ORDER — TIZANIDINE HCL 4 MG PO TABS: 4.0000 mg | ORAL_TABLET | Freq: Three times a day (TID) | ORAL | 0 refills | Status: DC | PRN

## 2016-02-29 MED ORDER — LIDOCAINE HCL 1 % IJ SOLN
3.0000 mL | INTRAMUSCULAR | Status: AC | PRN
  Administered 2016-02-29: 3 mL

## 2016-02-29 NOTE — Progress Notes (Signed)
Office Visit Note   Patient: Stephen Maldonado           Date of Birth: Dec 24, 1955           MRN: [id number] Visit Date: 02/29/2016              Requested by: Josetta Huddle, MD [address] [place] [place], [place] [address] PCP: Henrine Screws, MD   Assessment & Plan: Visit Diagnoses:  1. Status post total right knee replacement   2. Unilateral primary osteoarthritis, left knee   3. Chronic pain of left knee     Plan: He tolerated the steroid injection in his left knee well. We'll send him in Zanaflex as a muscle relaxant. See him back in a month see how is doing overall. He does want to consider a left total knee replacement at some point in the future.  Follow-Up Instructions: Return in about 4 weeks (around 03/28/2016).   Orders:  No orders of the defined types were placed in this encounter.  No orders of the defined types were placed in this encounter.     Procedures: Large Joint Inj Date/Time: 02/29/2016 2:45 PM Performed by: [name] Authorized by: [name]   Location:  Knee Site:  L knee Ultrasound Guidance: No   Fluoroscopic Guidance: No   Arthrogram: No   Medications:  3 mL lidocaine 1 %; 40 mg methylPREDNISolone acetate 40 MG/ML     Clinical Data: No additional findings.   Subjective: Chief Complaint  Patient presents with  . Lower Back - Follow-up, Pain  . Right Knee - Follow-up  . Left Knee - Pain, Follow-up    HPI He still having some low back pain but it doesn't have any radicular components that he describes. His left knee has been bothering him. He is 3 months out from her right total knee replacement. He likes to get back to work Monday. Review of Systems   Objective: Vital Signs: There were no vitals taken for this visit.  Physical Exam  Ortho Exam Examination of his right operative knee still shows a moderate effusion. He has pain across the lower aspect of the lumbar spine was significant  truncal obesity. There is no radicular components. He had excellent range of motion of his left knee and there is definitely midline medial and lateral tenderness as well as patella femoral crepitation. Specialty Comments:  No specialty comments available.  Imaging: No results found.   PMFS History: Patient Active Problem List   Diagnosis Date Noted  . Unilateral primary osteoarthritis, right knee 12/01/2015  . Status post total right knee replacement 12/01/2015   Past Medical History:  Diagnosis Date  . Arthritis    knees  . Bladder spasms   . BPH (benign prostatic hyperplasia)   . DVT (deep venous thrombosis) ([place]) ocyt 2016   left leg tx medically  . GERD (gastroesophageal reflux disease)   . History of colon polyps    hyperplastic 06-14-2010  . Hydronephrosis   . Hypertension   . Urinary retention     No family history on file.  Past Surgical History:  Procedure Laterality Date  . COLONOSCOPY  06/14/2010  . CYSTOSCOPY W/ RETROGRADES Bilateral 10/03/2015   Procedure: CYSTOSCOPY WITH RETROGRADE PYELOGRAM;  Surgeon: [name], MD;  Location: [facility];  Service: Urology;  Laterality: Bilateral;  . GREEN LIGHT LASER TURP (TRANSURETHRAL RESECTION OF PROSTATE N/A 10/03/2015   Procedure: GREEN LIGHT LASER,  TURP - STAGED(TRANSURETHRAL RESECTION  OF PROSTATE;  Surgeon: [name], MD;  Location[facility];  Service: Urology;  Laterality: N/A;  . KNEE ARTHROSCOPY     not sure which knee  . QUADRICEPS TENDON REPAIR Bilateral left 02/24/2003/   right 2006  . TOTAL KNEE ARTHROPLASTY Right 12/01/2015   Procedure: RIGHT TOTAL KNEE ARTHROPLASTY;  Surgeon: [name], MD;  Location: [facility];  Service: Orthopedics;  Laterality: Right;   Social History   Occupational History  . Not on file.   Social History Main Topics  . Smoking status: Never Smoker  . Smokeless tobacco: Former Systems developer    Types: Chew    Quit date: 02/05/1968  .  Alcohol use Yes     Comment: 1-2 beers days  . Drug use: No  . Sexual activity: Not on file

## 2016-03-18 ENCOUNTER — Other Ambulatory Visit (INDEPENDENT_AMBULATORY_CARE_PROVIDER_SITE_OTHER): Payer: Self-pay | Admitting: Orthopaedic Surgery

## 2016-03-18 NOTE — Telephone Encounter (Signed)
Please advise 

## 2016-03-28 ENCOUNTER — Ambulatory Visit (INDEPENDENT_AMBULATORY_CARE_PROVIDER_SITE_OTHER): Payer: BLUE CROSS/BLUE SHIELD | Admitting: Orthopaedic Surgery

## 2016-03-28 DIAGNOSIS — Z96651 Presence of right artificial knee joint: Secondary | ICD-10-CM

## 2016-03-28 NOTE — Progress Notes (Signed)
Stephen Maldonado is follow-up for his right knee as well as having had an injection in his left knee. The right knee was replaced in October 2017. We placed a steroid injection in his left knee it's last visit. He still been dealing with some back pain issues as well. He is unfortunately significantly obese nothing this creates some issues with his back. He also has been having a problem with peripheral edema. I talked about compressive garments and he rather avoid this if he can. He says even taken a "fluid pill" on occasion for her to follow examination of his left knee his range of motion is excellent this point. His swelling is minimal. His incisions well-healed. The knee feels ligamentously stable. He tolerates left knee motion better as well. He does have pain with flexion extension of lumbar spine. He does have some pitting edema in his shins.  At this point he'll continue increase his activities as he tolerates. I'll continue anti-inflammatory and a muscle relaxant as needed. He's a work on weight loss and core strengthening exercises. We'll see him back in about 3 months out is doing overall and I would like an AP and lateral of his right knee at that visit.

## 2016-04-03 DIAGNOSIS — N1330 Unspecified hydronephrosis: Secondary | ICD-10-CM | POA: Diagnosis not present

## 2016-04-03 DIAGNOSIS — R972 Elevated prostate specific antigen [PSA]: Secondary | ICD-10-CM | POA: Diagnosis not present

## 2016-04-03 DIAGNOSIS — N401 Enlarged prostate with lower urinary tract symptoms: Secondary | ICD-10-CM | POA: Diagnosis not present

## 2016-04-03 DIAGNOSIS — R3912 Poor urinary stream: Secondary | ICD-10-CM | POA: Diagnosis not present

## 2016-04-22 ENCOUNTER — Other Ambulatory Visit (INDEPENDENT_AMBULATORY_CARE_PROVIDER_SITE_OTHER): Payer: Self-pay | Admitting: Orthopaedic Surgery

## 2016-04-23 DIAGNOSIS — M5442 Lumbago with sciatica, left side: Secondary | ICD-10-CM | POA: Diagnosis not present

## 2016-04-23 DIAGNOSIS — M5441 Lumbago with sciatica, right side: Secondary | ICD-10-CM | POA: Diagnosis not present

## 2016-04-23 NOTE — Telephone Encounter (Signed)
Please advise 

## 2016-04-29 ENCOUNTER — Ambulatory Visit: Payer: BLUE CROSS/BLUE SHIELD | Attending: Nurse Practitioner | Admitting: Physical Therapy

## 2016-04-29 DIAGNOSIS — M545 Low back pain: Secondary | ICD-10-CM | POA: Diagnosis not present

## 2016-04-29 DIAGNOSIS — G8929 Other chronic pain: Secondary | ICD-10-CM | POA: Insufficient documentation

## 2016-04-29 NOTE — Therapy (Signed)
[facility] [place], [name], [address] Phone: [phone]   Fax:  [phone]  Physical Therapy Evaluation  Patient Details  Name: Stephen Maldonado MRN: [id number] Date of Birth: 09-Aug-1955 Referring Provider: Dianna Rossetti NP  Encounter Date: 04/29/2016      PT End of Session - 04/29/16 1336    Visit Number 1   Number of Visits 12   Date for PT Re-Evaluation 06/10/16   PT Start Time 0110   PT Stop Time 0200   PT Time Calculation (min) 50 min   Activity Tolerance Patient tolerated treatment well   Behavior During Therapy [facility] for tasks assessed/performed      Past Medical History:  Diagnosis Date  . Arthritis    knees  . Bladder spasms   . BPH (benign prostatic hyperplasia)   . DVT (deep venous thrombosis) ([place]) ocyt 2016   left leg tx medically  . GERD (gastroesophageal reflux disease)   . History of colon polyps    hyperplastic 06-14-2010  . Hydronephrosis   . Hypertension   . Urinary retention     Past Surgical History:  Procedure Laterality Date  . COLONOSCOPY  06/14/2010  . CYSTOSCOPY W/ RETROGRADES Bilateral 10/03/2015   Procedure: CYSTOSCOPY WITH RETROGRADE PYELOGRAM;  Surgeon: [name], MD;  Location: [facility];  Service: Urology;  Laterality: Bilateral;  . GREEN LIGHT LASER TURP (TRANSURETHRAL RESECTION OF PROSTATE N/A 10/03/2015   Procedure: GREEN LIGHT LASER,  TURP - STAGED(TRANSURETHRAL RESECTION OF PROSTATE;  Surgeon: [name], MD;  Location: [facility];  Service: Urology;  Laterality: N/A;  . KNEE ARTHROSCOPY     not sure which knee  . QUADRICEPS TENDON REPAIR Bilateral left 02/24/2003/   right 2006  . TOTAL KNEE ARTHROPLASTY Right 12/01/2015   Procedure: RIGHT TOTAL KNEE ARTHROPLASTY;  Surgeon: [name], MD;  Location: [facility];  Service: Orthopedics;  Laterality: Right;    There were no vitals filed for this visit.        Subjective Assessment - 04/29/16 1419    Subjective The patient c/o low back pain that increases with standing and walking.  Most of his pain is reported on the right today but have does have occasions of pain on the left and radiation of pain into his left buttock.  this has been going on for many years.  Sitting decreases his pain.  The patient states he is considering getting an inversion table.            [facility] PT Assessment - 04/29/16 0001      Assessment   Medical Diagnosis Acute right sided low back pain.   Referring Provider Dianna Rossetti NP   Onset Date/Surgical Date --  Ongoing.     Precautions   Precautions None     Restrictions   Weight Bearing Restrictions No     Balance Screen   Has the patient fallen in the past 6 months Yes   How many times? --  1.   Has the patient had a decrease in activity level because of a fear of falling?  Yes   Is the patient reluctant to leave their home because of a fear of falling?  No     Home Environment   Living Environment Private residence     Prior Function   Level of Independence Independent     Posture/Postural Control   Posture Comments --  Anterior pelvic tilt.     AROM  Lumbar Flexion Full with excellent intervertebral movement.   Lumbar Extension 20 degrees.     Strength   Overall Strength Comments Right knee= 4+/5.     Palpation   Palpation comment Tender over right QL and especially right iliolumbar and sacral iliac ligament.  Tenderness over left SIJ region also.     Special Tests    Special Tests Lumbar;Leg LengthTest  Absent bilateral LE DTR's.   Lumbar Tests --  (-) SLR testing. (-) left FABER test.   Leg length test  --  Equal leg lengths.     Ambulation/Gait   Gait Comments WNL.                   [facility] Adult PT Treatment/Exercise - 04/29/16 0001      Modalities   Modalities Electrical Stimulation;Moist Heat     Moist Heat Therapy   Number Minutes Moist Heat 20 Minutes    Moist Heat Location Lumbar Spine     Electrical Stimulation   Electrical Stimulation Location Bilateral low back.   Electrical Stimulation Action IFC   Electrical Stimulation Parameters 80-150 Hz x 20 minutes.   Electrical Stimulation Goals Pain                     PT Long Term Goals - 04/29/16 1417      PT LONG TERM GOAL #1   Title I with HEP   Time 6   Period Weeks   Status New     PT LONG TERM GOAL #2   Title Patient able to perform ADLS with LBP 4/01 or less.   Time 6   Period Weeks   Status New     PT LONG TERM GOAL #3   Title Patient able to stand for 30 minutes comfortably.   Time 6   Period Weeks   Status New               Plan - 04/29/16 1419    PT Treatment/Interventions Traction   PT Next Visit Plan Dry needling; STW/m; Combo e'stm/U/S.  Int traction beginning at 35% is also an option.      Patient will benefit from skilled therapeutic intervention in order to improve the following deficits and impairments:  Abnormal gait, Decreased range of motion, Pain, Decreased strength  Visit Diagnosis: Chronic right-sided low back pain without sciatica - Plan: PT plan of care cert/re-cert     Problem List Patient Active Problem List   Diagnosis Date Noted  . Unilateral primary osteoarthritis, right knee 12/01/2015  . Status post total right knee replacement 12/01/2015    APPLEGATE, [place] MPT 04/29/2016, 2:24 PM  [facility] [place], [name], [address] Phone: [phone]   Fax:  [phone]  Name: Stephen Maldonado MRN: [id number] Date of Birth: 1955-06-02

## 2016-05-07 ENCOUNTER — Ambulatory Visit: Payer: BLUE CROSS/BLUE SHIELD | Attending: Nurse Practitioner | Admitting: *Deleted

## 2016-05-07 DIAGNOSIS — R2689 Other abnormalities of gait and mobility: Secondary | ICD-10-CM | POA: Diagnosis not present

## 2016-05-07 DIAGNOSIS — M545 Low back pain, unspecified: Secondary | ICD-10-CM

## 2016-05-07 DIAGNOSIS — M25562 Pain in left knee: Secondary | ICD-10-CM | POA: Insufficient documentation

## 2016-05-07 DIAGNOSIS — G8929 Other chronic pain: Secondary | ICD-10-CM

## 2016-05-07 NOTE — Therapy (Signed)
[facility] [place], [name], [address] Phone: [phone]   Fax:  [phone]  Physical Therapy Treatment  Patient Details  Name: Stephen Maldonado MRN: [id number] Date of Birth: 04/19/1955 Referring Provider: Dianna Rossetti NP  Encounter Date: 05/07/2016      PT End of Session - 05/07/16 1646    Visit Number 2   Number of Visits 12   Date for PT Re-Evaluation 06/10/16   PT Start Time 1600   PT Stop Time 7106   PT Time Calculation (min) 37 min      Past Medical History:  Diagnosis Date  . Arthritis    knees  . Bladder spasms   . BPH (benign prostatic hyperplasia)   . DVT (deep venous thrombosis) ([place]) ocyt 2016   left leg tx medically  . GERD (gastroesophageal reflux disease)   . History of colon polyps    hyperplastic 06-14-2010  . Hydronephrosis   . Hypertension   . Urinary retention     Past Surgical History:  Procedure Laterality Date  . COLONOSCOPY  06/14/2010  . CYSTOSCOPY W/ RETROGRADES Bilateral 10/03/2015   Procedure: CYSTOSCOPY WITH RETROGRADE PYELOGRAM;  Surgeon: [name], MD;  Location: [facility];  Service: Urology;  Laterality: Bilateral;  . GREEN LIGHT LASER TURP (TRANSURETHRAL RESECTION OF PROSTATE N/A 10/03/2015   Procedure: GREEN LIGHT LASER,  TURP - STAGED(TRANSURETHRAL RESECTION OF PROSTATE;  Surgeon: [name], MD;  Location: [facility];  Service: Urology;  Laterality: N/A;  . KNEE ARTHROSCOPY     not sure which knee  . QUADRICEPS TENDON REPAIR Bilateral left 02/24/2003/   right 2006  . TOTAL KNEE ARTHROPLASTY Right 12/01/2015   Procedure: RIGHT TOTAL KNEE ARTHROPLASTY;  Surgeon: [name], MD;  [facility];  Service: Orthopedics;  Laterality: Right;    There were no vitals filed for this visit.      Subjective Assessment - 05/07/16 1608    Subjective The patient c/o low back pain that increases with standing and walking.   Most of his pain is reported on the right today but have does have occasions of pain on the left and radiation of pain into his left buttock.  this has been going on for many years.  Sitting decreases his pain.  The patient states he is considering getting an inversion table.   Pertinent History R knee TKR 12/01/15, HTN   Limitations Standing   How long can you stand comfortably? 10 min   Diagnostic tests xrays   Patient Stated Goals to get rid of pain                         [facility] Adult PT Treatment/Exercise - 05/07/16 0001      Modalities   Modalities Electrical Stimulation;Moist Heat;Traction     Ultrasound   Ultrasound Location  Bil. LB paras in prone 1.5 w/cm2 x 10 mins   Ultrasound Goals Pain     Traction   Type of Traction Lumbar   Min (lbs) 10   Max (lbs) 105  35% of BW 300#s   Hold Time 99   Rest Time 5   Time 15                     PT Long Term Goals - 04/29/16 1417      PT LONG TERM GOAL #1   Title I with HEP   Time 6  Period Weeks   Status New     PT LONG TERM GOAL #2   Title Patient able to perform ADLS with LBP 6/83 or less.   Time 6   Period Weeks   Status New     PT LONG TERM GOAL #3   Title Patient able to stand for 30 minutes comfortably.   Time 6   Period Weeks   Status New               Plan - 05/07/16 1647    Clinical Impression Statement Pt arrived to clinic with LBP doing about the same. He denied wanting heat and Estim due to minimal relief. He did fairly well with [place] and Pelvic traction although belts are somewhat hard to [place]. Traction was performed at 105#s  which is 35% of Pt's 300#  BW.Marland Kitchen Decreased pain after Rx.   Rehab Potential Good   Clinical Impairments Affecting Rehab Potential chronic Left knee pain   PT Frequency 2x / week   PT Duration 6 weeks   PT Treatment/Interventions Traction;Electrical Stimulation;Ultrasound;Dry needling   PT Next Visit Plan Dry needling; STW/m; Combo e'stm/U/S.   Int traction beginning at 35% is also an option.   Consulted and Agree with Plan of Care Patient      Patient will benefit from skilled therapeutic intervention in order to improve the following deficits and impairments:  Abnormal gait, Decreased range of motion, Pain, Decreased strength  Visit Diagnosis: Chronic right-sided low back pain without sciatica  Chronic pain of left knee  Other abnormalities of gait and mobility     Problem List Patient Active Problem List   Diagnosis Date Noted  . Unilateral primary osteoarthritis, right knee 12/01/2015  . Status post total right knee replacement 12/01/2015    Toshiye Kever,CHRIS, PTA 05/07/2016, 5:03 PM  [facility] [place], [name], [address] Phone: [phone]   Fax:  [phone]  Name: Stephen Maldonado MRN: [id number] Date of Birth: 08-14-1955

## 2016-05-08 DIAGNOSIS — D649 Anemia, unspecified: Secondary | ICD-10-CM | POA: Diagnosis not present

## 2016-05-08 DIAGNOSIS — I129 Hypertensive chronic kidney disease with stage 1 through stage 4 chronic kidney disease, or unspecified chronic kidney disease: Secondary | ICD-10-CM | POA: Diagnosis not present

## 2016-05-08 DIAGNOSIS — E669 Obesity, unspecified: Secondary | ICD-10-CM | POA: Diagnosis not present

## 2016-05-08 DIAGNOSIS — R5382 Chronic fatigue, unspecified: Secondary | ICD-10-CM | POA: Diagnosis not present

## 2016-05-08 DIAGNOSIS — N184 Chronic kidney disease, stage 4 (severe): Secondary | ICD-10-CM | POA: Diagnosis not present

## 2016-05-08 DIAGNOSIS — E785 Hyperlipidemia, unspecified: Secondary | ICD-10-CM | POA: Diagnosis not present

## 2016-05-09 ENCOUNTER — Ambulatory Visit: Payer: BLUE CROSS/BLUE SHIELD | Admitting: *Deleted

## 2016-05-09 DIAGNOSIS — G8929 Other chronic pain: Secondary | ICD-10-CM

## 2016-05-09 DIAGNOSIS — R2689 Other abnormalities of gait and mobility: Secondary | ICD-10-CM | POA: Diagnosis not present

## 2016-05-09 DIAGNOSIS — M545 Low back pain: Secondary | ICD-10-CM | POA: Diagnosis not present

## 2016-05-09 DIAGNOSIS — M25562 Pain in left knee: Secondary | ICD-10-CM | POA: Diagnosis not present

## 2016-05-09 NOTE — Therapy (Signed)
[facility] [place], [name], [address] Phone: [phone]   Fax:  [phone]  Physical Therapy Treatment  Patient Details  Name: Stephen Maldonado MRN: [id number] Date of Birth: 10/26/1955 Referring Provider: Dianna Rossetti NP  Encounter Date: 05/09/2016      PT End of Session - 05/09/16 1647    Visit Number 3   Number of Visits 12   Date for PT Re-Evaluation 06/10/16   PT Start Time 1600   PT Stop Time 3007   PT Time Calculation (min) 37 min      Past Medical History:  Diagnosis Date  . Arthritis    knees  . Bladder spasms   . BPH (benign prostatic hyperplasia)   . DVT (deep venous thrombosis) ([place]) ocyt 2016   left leg tx medically  . GERD (gastroesophageal reflux disease)   . History of colon polyps    hyperplastic 06-14-2010  . Hydronephrosis   . Hypertension   . Urinary retention     Past Surgical History:  Procedure Laterality Date  . COLONOSCOPY  06/14/2010  . CYSTOSCOPY W/ RETROGRADES Bilateral 10/03/2015   Procedure: CYSTOSCOPY WITH RETROGRADE PYELOGRAM;  Surgeon: [name], MD;  Location: [facility];  Service: Urology;  Laterality: Bilateral;  . GREEN LIGHT LASER TURP (TRANSURETHRAL RESECTION OF PROSTATE N/A 10/03/2015   Procedure: GREEN LIGHT LASER,  TURP - STAGED(TRANSURETHRAL RESECTION OF PROSTATE;  Surgeon: [name], MD;  Location: [facility];  Service: Urology;  Laterality: N/A;  . KNEE ARTHROSCOPY     not sure which knee  . QUADRICEPS TENDON REPAIR Bilateral left 02/24/2003/   right 2006  . TOTAL KNEE ARTHROPLASTY Right 12/01/2015   Procedure: RIGHT TOTAL KNEE ARTHROPLASTY;  Surgeon: [name], MD;  Location: [facility];  Service: Orthopedics;  Laterality: Right;    There were no vitals filed for this visit.      Subjective Assessment - 05/09/16 1641    Subjective  Pt did well with last Rx of Traction, but had to unload a truck and that  aggravated his LB.         The patient c/o low back pain that increases with standing and walking.  Most of his pain is reported on the right today but have does have occasions of pain on the left and radiation of pain into his left buttock.  this has been going on for many years.  Sitting decreases his pain.  The patient states he is considering getting an inversion table.   Pertinent History R knee TKR 12/01/15, HTN   Limitations Standing   How long can you stand comfortably? 10 min   Diagnostic tests xrays   Patient Stated Goals to get rid of pain   Currently in Pain? Yes   Pain Score 7    Pain Location Back   Pain Orientation Right;Left;Lower   Pain Descriptors / Indicators Aching;Sharp   Pain Type Chronic pain   Pain Onset More than a month ago   Pain Frequency Constant                         [facility] Adult PT Treatment/Exercise - 05/09/16 0001      Modalities   Modalities Electrical Stimulation;Moist Heat;Traction     Moist Heat Therapy   Number Minutes Moist Heat 15 Minutes   Moist Heat Location Lumbar Spine     Electrical Stimulation   Electrical Stimulation Location Bilateral low back.  IFC x 15 mins 80-150hz  in sitting   Electrical Stimulation Goals Pain     Traction   Type of Traction Lumbar   Min (lbs) 10   Max (lbs) 125  42% of 300#s   Hold Time 99   Rest Time 5   Time 15           Try donning traction belts with Pt lying down.                PT Long Term Goals - 04/29/16 1417      PT LONG TERM GOAL #1   Title I with HEP   Time 6   Period Weeks   Status New     PT LONG TERM GOAL #2   Title Patient able to perform ADLS with LBP 1/10 or less.   Time 6   Period Weeks   Status New     PT LONG TERM GOAL #3   Title Patient able to stand for 30 minutes comfortably.   Time 6   Period Weeks   Status New               Plan - 05/09/16 1648    Clinical Impression Statement Pt arrived to clinic today doing about the  same with LBP 7-8/10. He feels that traction did help some after last Rx, but he had to unload a truck that caused his LBP to increase. He was unsure about benefits from [place], but was ok trying IFC/ HMP in sitting and did well. Pelvic traction was performed at 125#s today and Pt responded well with decreased pain after Rx. and is considering an inversion table and a TENS unit.. Monitor traction response.   Clinical Impairments Affecting Rehab Potential chronic Left knee pain   PT Frequency 2x / week   PT Duration 6 weeks   PT Treatment/Interventions Traction;Electrical Stimulation;Ultrasound;Dry needling   PT Next Visit Plan Dry needling; STW/m; Combo e'stm/U/S.  Int traction beginning at 35% is also an option.   Consulted and Agree with Plan of Care Patient      Patient will benefit from skilled therapeutic intervention in order to improve the following deficits and impairments:  Abnormal gait, Decreased range of motion, Pain, Decreased strength  Visit Diagnosis: Chronic right-sided low back pain without sciatica  Chronic pain of left knee  Other abnormalities of gait and mobility     Problem List Patient Active Problem List   Diagnosis Date Noted  . Unilateral primary osteoarthritis, right knee 12/01/2015  . Status post total right knee replacement 12/01/2015    RAMSEUR,CHRIS, PTA 05/09/2016, 5:04 PM  [facility] [place], [name], [address] Phone: [phone]   Fax:  [phone]  Name: Finley Chevez MRN: [id number] Date of Birth: 1955/04/21

## 2016-05-10 ENCOUNTER — Other Ambulatory Visit (INDEPENDENT_AMBULATORY_CARE_PROVIDER_SITE_OTHER): Payer: Self-pay | Admitting: Orthopaedic Surgery

## 2016-05-10 NOTE — Telephone Encounter (Signed)
Please advise 

## 2016-05-14 ENCOUNTER — Ambulatory Visit: Payer: BLUE CROSS/BLUE SHIELD | Admitting: *Deleted

## 2016-05-14 DIAGNOSIS — R2689 Other abnormalities of gait and mobility: Secondary | ICD-10-CM

## 2016-05-14 DIAGNOSIS — G8929 Other chronic pain: Secondary | ICD-10-CM | POA: Diagnosis not present

## 2016-05-14 DIAGNOSIS — M25562 Pain in left knee: Secondary | ICD-10-CM

## 2016-05-14 DIAGNOSIS — M545 Low back pain: Principal | ICD-10-CM

## 2016-05-14 NOTE — Therapy (Signed)
[facility] [place], [name], [address] Phone: [phone]   Fax:  [phone]  Physical Therapy Treatment  Patient Details  Name: Stephen Maldonado MRN: [id number] Date of Birth: 1955/10/22 Referring Provider: Dianna Rossetti NP  Encounter Date: 05/14/2016      PT End of Session - 05/14/16 1637    Visit Number 4   Number of Visits 12   Date for PT Re-Evaluation 06/10/16   PT Start Time 1600   PT Stop Time 6948   PT Time Calculation (min) 37 min      Past Medical History:  Diagnosis Date  . Arthritis    knees  . Bladder spasms   . BPH (benign prostatic hyperplasia)   . DVT (deep venous thrombosis) ([place]) ocyt 2016   left leg tx medically  . GERD (gastroesophageal reflux disease)   . History of colon polyps    hyperplastic 06-14-2010  . Hydronephrosis   . Hypertension   . Urinary retention     Past Surgical History:  Procedure Laterality Date  . COLONOSCOPY  06/14/2010  . CYSTOSCOPY W/ RETROGRADES Bilateral 10/03/2015   Procedure: CYSTOSCOPY WITH RETROGRADE PYELOGRAM;  Surgeon: [name], MD;  Location: [facility];  Service: Urology;  Laterality: Bilateral;  . GREEN LIGHT LASER TURP (TRANSURETHRAL RESECTION OF PROSTATE N/A 10/03/2015   Procedure: GREEN LIGHT LASER,  TURP - STAGED(TRANSURETHRAL RESECTION OF PROSTATE;  Surgeon: [name], MD;  Location: [facility];  Service: Urology;  Laterality: N/A;  . KNEE ARTHROSCOPY     not sure which knee  . QUADRICEPS TENDON REPAIR Bilateral left 02/24/2003/   right 2006  . TOTAL KNEE ARTHROPLASTY Right 12/01/2015   Procedure: RIGHT TOTAL KNEE ARTHROPLASTY;  Surgeon: [name], MD;  Location[facility];  Service: Orthopedics;  Laterality: Right;    There were no vitals filed for this visit.                       [facility] Adult PT Treatment/Exercise - 05/14/16 0001      Modalities   Modalities Electrical  Stimulation;Moist Heat;Traction     Moist Heat Therapy   Number Minutes Moist Heat 15 Minutes   Moist Heat Location Lumbar Spine     Electrical Stimulation   Electrical Stimulation Location Bilateral low back.  IFC x 15 mins 80-150hz  in sitting   Electrical Stimulation Goals Pain     Traction   Type of Traction Lumbar   Min (lbs) 10   Max (lbs) 140   Hold Time 99   Rest Time 5   Time 15                     PT Long Term Goals - 04/29/16 1417      PT LONG TERM GOAL #1   Title I with HEP   Time 6   Period Weeks   Status New     PT LONG TERM GOAL #2   Title Patient able to perform ADLS with LBP 5/46 or less.   Time 6   Period Weeks   Status New     PT LONG TERM GOAL #3   Title Patient able to stand for 30 minutes comfortably.   Time 6   Period Weeks   Status New               Plan - 05/14/16 1638    Clinical Impression Statement Pt arrived to  clinic today doing about the same with CC being that he cannot stand very long before LBP increases. Pelvic traction was performed at 140 #s today and Pt. did well   Clinical Impairments Affecting Rehab Potential chronic Left knee pain   PT Frequency 2x / week   PT Duration 6 weeks   PT Treatment/Interventions Traction;Electrical Stimulation;Ultrasound;Dry needling   PT Next Visit Plan Dry needling; STW/m; Combo e'stm/U/S.  Int traction beginning at 35% is also an option.. Try 150#s if Pt did well   Consulted and Agree with Plan of Care Patient      Patient will benefit from skilled therapeutic intervention in order to improve the following deficits and impairments:  Abnormal gait, Decreased range of motion, Pain, Decreased strength  Visit Diagnosis: Chronic right-sided low back pain without sciatica  Chronic pain of left knee  Other abnormalities of gait and mobility     Problem List Patient Active Problem List   Diagnosis Date Noted  . Unilateral primary osteoarthritis, right knee 12/01/2015   . Status post total right knee replacement 12/01/2015    Shyanne Mcclary,CHRIS, PTA 05/14/2016, 6:05 PM  [facility] [place], [name], [address] Phone: [phone]   Fax:  [phone]  Name: Stephen Maldonado MRN: [id number] Date of Birth: 1955-04-29

## 2016-05-16 ENCOUNTER — Ambulatory Visit: Payer: BLUE CROSS/BLUE SHIELD | Admitting: *Deleted

## 2016-05-16 DIAGNOSIS — R2689 Other abnormalities of gait and mobility: Secondary | ICD-10-CM

## 2016-05-16 DIAGNOSIS — G8929 Other chronic pain: Secondary | ICD-10-CM | POA: Diagnosis not present

## 2016-05-16 DIAGNOSIS — M25562 Pain in left knee: Secondary | ICD-10-CM | POA: Diagnosis not present

## 2016-05-16 DIAGNOSIS — M545 Low back pain: Secondary | ICD-10-CM | POA: Diagnosis not present

## 2016-05-16 NOTE — Therapy (Signed)
[facility] [place], [name], [address] Phone: [phone]   Fax:  [phone]  Physical Therapy Treatment  Patient Details  Name: Stephen Maldonado MRN: [id number] Date of Birth: 22-Dec-1955 Referring Provider: Dianna Rossetti NP  Encounter Date: 05/16/2016      PT End of Session - 05/16/16 1722    Visit Number 5   Number of Visits 12   Date for PT Re-Evaluation 06/10/16   PT Start Time 1600   PT Stop Time 1884   PT Time Calculation (min) 37 min      Past Medical History:  Diagnosis Date  . Arthritis    knees  . Bladder spasms   . BPH (benign prostatic hyperplasia)   . DVT (deep venous thrombosis) ([place]) ocyt 2016   left leg tx medically  . GERD (gastroesophageal reflux disease)   . History of colon polyps    hyperplastic 06-14-2010  . Hydronephrosis   . Hypertension   . Urinary retention     Past Surgical History:  Procedure Laterality Date  . COLONOSCOPY  06/14/2010  . CYSTOSCOPY W/ RETROGRADES Bilateral 10/03/2015   Procedure: CYSTOSCOPY WITH RETROGRADE PYELOGRAM;  Surgeon: [name], MD;  Location: [facility];  Service: Urology;  Laterality: Bilateral;  . GREEN LIGHT LASER TURP (TRANSURETHRAL RESECTION OF PROSTATE N/A 10/03/2015   Procedure: GREEN LIGHT LASER,  TURP - STAGED(TRANSURETHRAL RESECTION OF PROSTATE;  Surgeon: [name], MD;  Location: [facility];  Service: Urology;  Laterality: N/A;  . KNEE ARTHROSCOPY     not sure which knee  . QUADRICEPS TENDON REPAIR Bilateral left 02/24/2003/   right 2006  . TOTAL KNEE ARTHROPLASTY Right 12/01/2015   Procedure: RIGHT TOTAL KNEE ARTHROPLASTY;  Surgeon: [name], MD;  Location: [facility];  Service: Orthopedics;  Laterality: Right;    There were no vitals filed for this visit.                       [facility] Adult PT Treatment/Exercise - 05/16/16 0001      Modalities   Modalities Electrical  Stimulation;Moist Heat;Traction     Moist Heat Therapy   Number Minutes Moist Heat 15 Minutes   Moist Heat Location Lumbar Spine     Electrical Stimulation   Electrical Stimulation Location Bilateral low back.  IFC x 15 mins 80-150hz in sitting   Electrical Stimulation Goals Pain     Traction   Type of Traction Lumbar   Min (lbs) 10   Max (lbs) 150   Hold Time 99   Rest Time 5   Time 15                     PT Long Term Goals - 04/29/16 1417      PT LONG TERM GOAL #1   Title I with HEP   Time 6   Period Weeks   Status New     PT LONG TERM GOAL #2   Title Patient able to perform ADLS with LBP 1/66 or less.   Time 6   Period Weeks   Status New     PT LONG TERM GOAL #3   Title Patient able to stand for 30 minutes comfortably.   Time 6   Period Weeks   Status New               Plan - 05/16/16 1723    Clinical Impression Statement Pt arrived to  clinic today and felt a little better after last Rx. Pelvic Traction was performed at 150#s today and tolerated well. No LTGs met yet due to pain   Clinical Impairments Affecting Rehab Potential chronic Left knee pain   PT Frequency 2x / week   PT Duration 6 weeks   PT Treatment/Interventions Traction;Electrical Stimulation;Ultrasound;Dry needling   PT Next Visit Plan Dry needling; STW/m; Combo e'stm/U/S.  Int traction beginning at 35% is also an option.. Try 150#s if Pt did well   Consulted and Agree with Plan of Care Patient      Patient will benefit from skilled therapeutic intervention in order to improve the following deficits and impairments:  Abnormal gait, Decreased range of motion, Pain, Decreased strength  Visit Diagnosis: Chronic right-sided low back pain without sciatica  Chronic pain of left knee  Other abnormalities of gait and mobility     Problem List Patient Active Problem List   Diagnosis Date Noted  . Unilateral primary osteoarthritis, right knee 12/01/2015  . Status post  total right knee replacement 12/01/2015    RAMSEUR,CHRIS, PTA 05/16/2016, 5:30 PM  [facility] [place], [name], [address] Phone: [phone]   Fax:  [phone]  Name: Stephen Maldonado MRN: [id number] Date of Birth: Sep 21, 1955

## 2016-05-21 ENCOUNTER — Encounter: Payer: BLUE CROSS/BLUE SHIELD | Admitting: Physical Therapy

## 2016-05-23 ENCOUNTER — Ambulatory Visit: Payer: BLUE CROSS/BLUE SHIELD | Admitting: *Deleted

## 2016-05-23 DIAGNOSIS — M545 Low back pain: Secondary | ICD-10-CM | POA: Diagnosis not present

## 2016-05-23 DIAGNOSIS — R2689 Other abnormalities of gait and mobility: Secondary | ICD-10-CM | POA: Diagnosis not present

## 2016-05-23 DIAGNOSIS — G8929 Other chronic pain: Secondary | ICD-10-CM

## 2016-05-23 DIAGNOSIS — M25562 Pain in left knee: Secondary | ICD-10-CM

## 2016-05-23 NOTE — Therapy (Signed)
[facility] [place], [name], [address] Phone: [phone]   Fax:  [phone]  Physical Therapy Treatment  Patient Details  Name: Stephen Maldonado MRN: [id number] Date of Birth: 16-Mar-1955 Referring Provider: Dianna Rossetti NP  Encounter Date: 05/23/2016      PT End of Session - 05/23/16 1634    Visit Number 6   Number of Visits 12   Date for PT Re-Evaluation 06/10/16   PT Start Time 1600   PT Stop Time 2637   PT Time Calculation (min) 38 min      Past Medical History:  Diagnosis Date  . Arthritis    knees  . Bladder spasms   . BPH (benign prostatic hyperplasia)   . DVT (deep venous thrombosis) ([place]) ocyt 2016   left leg tx medically  . GERD (gastroesophageal reflux disease)   . History of colon polyps    hyperplastic 06-14-2010  . Hydronephrosis   . Hypertension   . Urinary retention     Past Surgical History:  Procedure Laterality Date  . COLONOSCOPY  06/14/2010  . CYSTOSCOPY W/ RETROGRADES Bilateral 10/03/2015   Procedure: CYSTOSCOPY WITH RETROGRADE PYELOGRAM;  Surgeon: [name], MD;  Location: [facility];  Service: Urology;  Laterality: Bilateral;  . GREEN LIGHT LASER TURP (TRANSURETHRAL RESECTION OF PROSTATE N/A 10/03/2015   Procedure: GREEN LIGHT LASER,  TURP - STAGED(TRANSURETHRAL RESECTION OF PROSTATE;  Surgeon: [name], MD;  Location: [facility];  Service: Urology;  Laterality: N/A;  . KNEE ARTHROSCOPY     not sure which knee  . QUADRICEPS TENDON REPAIR Bilateral left 02/24/2003/   right 2006  . TOTAL KNEE ARTHROPLASTY Right 12/01/2015   Procedure: RIGHT TOTAL KNEE ARTHROPLASTY;  Surgeon: [name], MD;  Location: [facility];  Service: Orthopedics;  Laterality: Right;    There were no vitals filed for this visit.      Subjective Assessment - 05/23/16 1604    Subjective Having a lot of allergy problems.  Did well with traction last Rx. I am  able to stand longer and recover quicker.    Pertinent History R knee TKR 12/01/15, HTN   Limitations Standing   How long can you stand comfortably? 10 min   Diagnostic tests xrays   Patient Stated Goals to get rid of pain   Currently in Pain? Yes   Pain Score 6    Pain Location Back   Pain Orientation Right;Left;Lower   Pain Descriptors / Indicators Aching;Sharp   Pain Onset More than a month ago   Pain Frequency Constant                         [facility] Adult PT Treatment/Exercise - 05/23/16 0001      Modalities   Modalities Electrical Stimulation;Moist Heat;Traction     Moist Heat Therapy   Number Minutes Moist Heat 15 Minutes   Moist Heat Location Lumbar Spine     Electrical Stimulation   Electrical Stimulation Location Bilateral low back.  IFC x 15 mins 80-150hz  in sitting   Electrical Stimulation Goals Pain     Traction   Type of Traction Lumbar   Min (lbs) 10   Max (lbs) 150   Hold Time 99   Rest Time 5   Time 15                     PT Long Term Goals - 04/29/16 1417  PT LONG TERM GOAL #1   Title I with HEP   Time 6   Period Weeks   Status New     PT LONG TERM GOAL #2   Title Patient able to perform ADLS with LBP 7/02 or less.   Time 6   Period Weeks   Status New     PT LONG TERM GOAL #3   Title Patient able to stand for 30 minutes comfortably.   Time 6   Period Weeks   Status New               Plan - 05/23/16 1639    Clinical Impression Statement Pt arrived to clinic today not feeling well due to allergies. He did well with Pelvic traction today at 150#s again and feels that Rxs are helping. He is able to atand longer before pain starts and recovers quicker. LTGs ongoing at this time   Rehab Potential Good   Clinical Impairments Affecting Rehab Potential chronic Left knee pain   PT Frequency 2x / week   PT Duration 6 weeks   PT Treatment/Interventions Traction;Electrical Stimulation;Ultrasound;Dry needling    PT Next Visit Plan Dry needling; STW/m; Combo e'stm/U/S.  Int traction beginning at 35% is also an option.. Try 150#s if Pt did well   Consulted and Agree with Plan of Care Patient      Patient will benefit from skilled therapeutic intervention in order to improve the following deficits and impairments:  Abnormal gait, Decreased range of motion, Pain, Decreased strength  Visit Diagnosis: Chronic right-sided low back pain without sciatica  Chronic pain of left knee     Problem List Patient Active Problem List   Diagnosis Date Noted  . Unilateral primary osteoarthritis, right knee 12/01/2015  . Status post total right knee replacement 12/01/2015    Lurene Robley,CHRIS, PTA 05/23/2016, 5:03 PM  [facility] [place], [name], [address] Phone: [phone]   Fax:  [phone]  Name: Stephen Maldonado MRN: [id number] Date of Birth: 06/10/1955

## 2016-05-28 ENCOUNTER — Ambulatory Visit: Payer: BLUE CROSS/BLUE SHIELD | Admitting: *Deleted

## 2016-05-28 DIAGNOSIS — R2689 Other abnormalities of gait and mobility: Secondary | ICD-10-CM

## 2016-05-28 DIAGNOSIS — M545 Low back pain: Principal | ICD-10-CM

## 2016-05-28 DIAGNOSIS — G8929 Other chronic pain: Secondary | ICD-10-CM | POA: Diagnosis not present

## 2016-05-28 DIAGNOSIS — M25562 Pain in left knee: Secondary | ICD-10-CM

## 2016-05-28 NOTE — Therapy (Signed)
[facility] [place], [name], [address] Phone: [phone]   Fax:  [phone]  Physical Therapy Treatment  Patient Details  Name: Stephen Maldonado MRN: [id number] Date of Birth: 1955-09-10 Referring Provider: Dianna Rossetti NP  Encounter Date: 05/28/2016      PT End of Session - 05/28/16 1613    Visit Number 7   Number of Visits 12   Date for PT Re-Evaluation 06/10/16   PT Start Time 1600   PT Stop Time 6962   PT Time Calculation (min) 38 min      Past Medical History:  Diagnosis Date  . Arthritis    knees  . Bladder spasms   . BPH (benign prostatic hyperplasia)   . DVT (deep venous thrombosis) ([place]) ocyt 2016   left leg tx medically  . GERD (gastroesophageal reflux disease)   . History of colon polyps    hyperplastic 06-14-2010  . Hydronephrosis   . Hypertension   . Urinary retention     Past Surgical History:  Procedure Laterality Date  . COLONOSCOPY  06/14/2010  . CYSTOSCOPY W/ RETROGRADES Bilateral 10/03/2015   Procedure: CYSTOSCOPY WITH RETROGRADE PYELOGRAM;  Surgeon: [name], MD;  Location: [facility];  Service: Urology;  Laterality: Bilateral;  . GREEN LIGHT LASER TURP (TRANSURETHRAL RESECTION OF PROSTATE N/A 10/03/2015   Procedure: GREEN LIGHT LASER,  TURP - STAGED(TRANSURETHRAL RESECTION OF PROSTATE;  Surgeon: [name], MD;  Location: [facility];  Service: Urology;  Laterality: N/A;  . KNEE ARTHROSCOPY     not sure which knee  . QUADRICEPS TENDON REPAIR Bilateral left 02/24/2003/   right 2006  . TOTAL KNEE ARTHROPLASTY Right 12/01/2015   Procedure: RIGHT TOTAL KNEE ARTHROPLASTY;  Surgeon: [name], MD;  Location[facility];  Service: Orthopedics;  Laterality: Right;    There were no vitals filed for this visit.      Subjective Assessment - 05/28/16 1610    Subjective Having a lot of allergy problems.  Did well with traction last Rx. I am  able to stand longer and recover quicker. Inversion Table will be here tonight. Possible DC after next visit   Pertinent History R knee TKR 12/01/15, HTN   Limitations Standing   How long can you stand comfortably? 10 min   Diagnostic tests xrays   Patient Stated Goals to get rid of pain   Currently in Pain? Yes   Pain Score 5    Pain Location Back   Pain Orientation Right;Left;Lower   Pain Descriptors / Indicators Aching;Sharp   Pain Type Chronic pain   Pain Onset More than a month ago   Pain Frequency Intermittent                         [facility] Adult PT Treatment/Exercise - 05/28/16 0001      Moist Heat Therapy   Number Minutes Moist Heat 15 Minutes   Moist Heat Location Lumbar Spine     Electrical Stimulation   Electrical Stimulation Location Bilateral low back.  IFC x 15 mins 80-150hz  in sitting   Electrical Stimulation Goals Pain     Traction   Type of Traction Lumbar   Min (lbs) 10   Max (lbs) 150   Hold Time 99   Rest Time 5   Time 15                     PT Long Term  Goals - 04/29/16 1417      PT LONG TERM GOAL #1   Title I with HEP   Time 6   Period Weeks   Status New     PT LONG TERM GOAL #2   Title Patient able to perform ADLS with LBP 8/31 or less.   Time 6   Period Weeks   Status New     PT LONG TERM GOAL #3   Title Patient able to stand for 30 minutes comfortably.   Time 6   Period Weeks   Status New               Plan - 05/28/16 1614    Clinical Impression Statement Pt arrived to the clinic today doing a little better still. He feels that traction is helping some with the pain. He is able to stand a little longer before pain starts and recovers quicker still. Traction was performed at 150 3S again and tolerated well.    Rehab Potential Good   Clinical Impairments Affecting Rehab Potential chronic Left knee pain   PT Frequency 2x / week   PT Duration 6 weeks   PT Treatment/Interventions  Traction;Electrical Stimulation;Ultrasound;Dry needling   PT Next Visit Plan Dry needling; STW/m; Combo e'stm/U/S.  Int traction beginning at 35% is also an option.. Try 150#s again. Possible DC   Consulted and Agree with Plan of Care Patient      Patient will benefit from skilled therapeutic intervention in order to improve the following deficits and impairments:  Abnormal gait, Decreased range of motion, Pain, Decreased strength  Visit Diagnosis: Chronic right-sided low back pain without sciatica  Chronic pain of left knee  Other abnormalities of gait and mobility     Problem List Patient Active Problem List   Diagnosis Date Noted  . Unilateral primary osteoarthritis, right knee 12/01/2015  . Status post total right knee replacement 12/01/2015    RAMSEUR,CHRIS, PTA 05/28/2016, 4:52 PM  [facility] [place], [name], [address] Phone: [phone]   Fax:  [phone]  Name: Axxel Gude MRN: [id number] Date of Birth: October 05, 1955

## 2016-05-30 ENCOUNTER — Ambulatory Visit: Payer: BLUE CROSS/BLUE SHIELD | Admitting: *Deleted

## 2016-05-30 DIAGNOSIS — M545 Low back pain, unspecified: Secondary | ICD-10-CM

## 2016-05-30 DIAGNOSIS — R2689 Other abnormalities of gait and mobility: Secondary | ICD-10-CM | POA: Diagnosis not present

## 2016-05-30 DIAGNOSIS — M25562 Pain in left knee: Secondary | ICD-10-CM | POA: Diagnosis not present

## 2016-05-30 DIAGNOSIS — G8929 Other chronic pain: Secondary | ICD-10-CM

## 2016-05-30 NOTE — Therapy (Signed)
[facility] [place], [name], [address] Phone: [phone]   Fax:  [phone]  Physical Therapy Treatment  Patient Details  Name: Stephen Maldonado MRN: [id number] Date of Birth: 1955-07-09 Referring Provider: Dianna Rossetti NP  Encounter Date: 05/30/2016      PT End of Session - 05/30/16 1621    Visit Number 8   Number of Visits 12   Date for PT Re-Evaluation 06/10/16   PT Start Time 1600   PT Stop Time 4268   PT Time Calculation (min) 37 min      Past Medical History:  Diagnosis Date  . Arthritis    knees  . Bladder spasms   . BPH (benign prostatic hyperplasia)   . DVT (deep venous thrombosis) ([place]) ocyt 2016   left leg tx medically  . GERD (gastroesophageal reflux disease)   . History of colon polyps    hyperplastic 06-14-2010  . Hydronephrosis   . Hypertension   . Urinary retention     Past Surgical History:  Procedure Laterality Date  . COLONOSCOPY  06/14/2010  . CYSTOSCOPY W/ RETROGRADES Bilateral 10/03/2015   Procedure: CYSTOSCOPY WITH RETROGRADE PYELOGRAM;  Surgeon: [name], MD;  Location: [facility];  Service: Urology;  Laterality: Bilateral;  . GREEN LIGHT LASER TURP (TRANSURETHRAL RESECTION OF PROSTATE N/A 10/03/2015   Procedure: GREEN LIGHT LASER,  TURP - STAGED(TRANSURETHRAL RESECTION OF PROSTATE;  Surgeon: [name], MD;  Location: [facility];  Service: Urology;  Laterality: N/A;  . KNEE ARTHROSCOPY     not sure which knee  . QUADRICEPS TENDON REPAIR Bilateral left 02/24/2003/   right 2006  . TOTAL KNEE ARTHROPLASTY Right 12/01/2015   Procedure: RIGHT TOTAL KNEE ARTHROPLASTY;  Surgeon: [name], MD;  Location: [facility];  Service: Orthopedics;  Laterality: Right;    There were no vitals filed for this visit.      Subjective Assessment - 05/30/16 1620    Subjective Did good with last Rx and would like to finish all Rxs   Pertinent  History R knee TKR 12/01/15, HTN   Limitations Standing   How long can you stand comfortably? 10 min   Diagnostic tests xrays   Patient Stated Goals to get rid of pain   Currently in Pain? Yes   Pain Score 5    Pain Location Back   Pain Orientation Right;Lower;Left   Pain Type Chronic pain   Pain Onset More than a month ago   Pain Frequency Intermittent                         [facility] Adult PT Treatment/Exercise - 05/30/16 0001      Modalities   Modalities Electrical Stimulation;Moist Heat;Traction     Moist Heat Therapy   Number Minutes Moist Heat 15 Minutes   Moist Heat Location Lumbar Spine     Electrical Stimulation   Electrical Stimulation Location Bilateral low back.  IFC x 15 mins 80-150hz  in sitting   Electrical Stimulation Goals Pain     Traction   Type of Traction Lumbar   Min (lbs) 10   Max (lbs) 150   Hold Time 99   Rest Time 5   Time 15                     PT Long Term Goals - 05/30/16 1649      PT LONG TERM GOAL #1   Title  I with HEP   Time 6   Period Weeks   Status On-going     PT LONG TERM GOAL #2   Title Patient able to perform ADLS with LBP 7/42 or less.   Time 6   Period Weeks   Status On-going     PT LONG TERM GOAL #3   Title Patient able to stand for 30 minutes comfortably.   Time 6   Period Weeks   Status On-going     PT LONG TERM GOAL #4   Title Patient able to ambulate without R trunk lean.   Time 8   Period Weeks   Status On-going               Plan - 05/30/16 1709    Clinical Impression Statement Pt arrived to clinic today doing fairly well. He did well again with Rx and feels 10% better overall. He can stand a little longer before pain starts and the time to recover is less once he sits. LTGs are ongoing at this time due to pain.   Rehab Potential Good   Clinical Impairments Affecting Rehab Potential chronic Left knee pain   PT Frequency 2x / week   PT Duration 6 weeks   PT  Treatment/Interventions Traction;Electrical Stimulation;Ultrasound;Dry needling   PT Next Visit Plan Dry needling; STW/m; Combo e'stm/U/S.  Int traction beginning at 35% is also an option.. Try 150#s again.       Patient will benefit from skilled therapeutic intervention in order to improve the following deficits and impairments:  Abnormal gait, Decreased range of motion, Pain, Decreased strength  Visit Diagnosis: Chronic right-sided low back pain without sciatica     Problem List Patient Active Problem List   Diagnosis Date Noted  . Unilateral primary osteoarthritis, right knee 12/01/2015  . Status post total right knee replacement 12/01/2015    Tranell Wojtkiewicz,CHRIS, PTA 05/30/2016, 5:16 PM  [facility] [place], [name], [address] Phone: [phone]   Fax:  [phone]  Name: Winthrop Shannahan MRN: [id number] Date of Birth: 17-Aug-1955

## 2016-06-04 ENCOUNTER — Encounter: Payer: Self-pay | Admitting: Physical Therapy

## 2016-06-04 ENCOUNTER — Ambulatory Visit: Payer: BLUE CROSS/BLUE SHIELD | Attending: Nurse Practitioner | Admitting: Physical Therapy

## 2016-06-04 DIAGNOSIS — R2689 Other abnormalities of gait and mobility: Secondary | ICD-10-CM | POA: Diagnosis not present

## 2016-06-04 DIAGNOSIS — M25562 Pain in left knee: Secondary | ICD-10-CM | POA: Insufficient documentation

## 2016-06-04 DIAGNOSIS — M545 Low back pain: Secondary | ICD-10-CM | POA: Diagnosis not present

## 2016-06-04 DIAGNOSIS — G8929 Other chronic pain: Secondary | ICD-10-CM | POA: Diagnosis not present

## 2016-06-04 NOTE — Therapy (Signed)
[facility] [place], [name], [address] Phone: [phone]   Fax:  [phone]  Physical Therapy Treatment  Patient Details  Name: Zakye Baby MRN: [id number] Date of Birth: [birth year] Referring Provider: Dianna Rossetti NP  Encounter Date: 06/04/2016      PT End of Session - 06/04/16 1632    Visit Number 9   Number of Visits 12   Date for PT Re-Evaluation 06/10/16   PT Start Time 0277   PT Stop Time 1648   PT Time Calculation (min) 41 min   Activity Tolerance Patient tolerated treatment well   Behavior During Therapy [facility] for tasks assessed/performed      Past Medical History:  Diagnosis Date  . Arthritis    knees  . Bladder spasms   . BPH (benign prostatic hyperplasia)   . DVT (deep venous thrombosis) ([place]) ocyt 2016   left leg tx medically  . GERD (gastroesophageal reflux disease)   . History of colon polyps    hyperplastic 06-14-2010  . Hydronephrosis   . Hypertension   . Urinary retention     Past Surgical History:  Procedure Laterality Date  . COLONOSCOPY  06/14/2010  . CYSTOSCOPY W/ RETROGRADES Bilateral 10/03/2015   Procedure: CYSTOSCOPY WITH RETROGRADE PYELOGRAM;  Surgeon: [name], MD;  Location: [facility];  Service: Urology;  Laterality: Bilateral;  . GREEN LIGHT LASER TURP (TRANSURETHRAL RESECTION OF PROSTATE N/A 10/03/2015   Procedure: GREEN LIGHT LASER,  TURP - STAGED(TRANSURETHRAL RESECTION OF PROSTATE;  Surgeon: [name], MD;  Location: [facility];  Service: Urology;  Laterality: N/A;  . KNEE ARTHROSCOPY     not sure which knee  . QUADRICEPS TENDON REPAIR Bilateral left 02/24/2003/   right 2006  . TOTAL KNEE ARTHROPLASTY Right 12/01/2015   Procedure: RIGHT TOTAL KNEE ARTHROPLASTY;  Surgeon: [name], MD;  Location: [facility];  Service: Orthopedics;  Laterality: Right;    There were no vitals filed for this visit.      Subjective  Assessment - 06/04/16 1633    Subjective Patient reports that he recently purchased an inversion table for home.   Pertinent History R knee TKR 12/01/15, HTN   Limitations Standing   How long can you stand comfortably? 10 min   Diagnostic tests xrays   Patient Stated Goals to get rid of pain   Currently in Pain? No/denies            [facility] PT Assessment - 06/04/16 0001      Assessment   Medical Diagnosis Acute right sided low back pain.                     [facility] Adult PT Treatment/Exercise - 06/04/16 0001      Modalities   Modalities Electrical Stimulation;Moist Heat;Traction     Moist Heat Therapy   Number Minutes Moist Heat 15 Minutes   Moist Heat Location Lumbar Spine     Electrical Stimulation   Electrical Stimulation Location B lumbar paraspinals   Electrical Stimulation Action IFC   Electrical Stimulation Parameters 80-150 hz x15 min   Electrical Stimulation Goals Pain     Traction   Type of Traction Lumbar   Min (lbs) 5   Max (lbs) 150   Hold Time 99   Rest Time 5   Time 15                     PT Long Term Goals -  05/30/16 1649      PT LONG TERM GOAL #1   Title I with HEP   Time 6   Period Weeks   Status On-going     PT LONG TERM GOAL #2   Title Patient able to perform ADLS with LBP 2/95 or less.   Time 6   Period Weeks   Status On-going     PT LONG TERM GOAL #3   Title Patient able to stand for 30 minutes comfortably.   Time 6   Period Weeks   Status On-going     PT LONG TERM GOAL #4   Title Patient able to ambulate without R trunk lean.   Time 8   Period Weeks   Status On-going               Plan - 06/04/16 1634    Clinical Impression Statement Patient arrived to treatment with no current low back pain. Normal modalities response noted following removal of the modalities. Patient tolerated 150# max traction well again today with assistance from [name], MPT.   Rehab Potential Good   Clinical  Impairments Affecting Rehab Potential chronic Left knee pain   PT Frequency 2x / week   PT Duration 6 weeks   PT Treatment/Interventions Traction;Electrical Stimulation;Ultrasound;Dry needling   PT Next Visit Plan Dry needling; STW/m; Combo e'stm/U/S.  Int traction beginning at 35% is also an option.. Try 150#s again.    Consulted and Agree with Plan of Care Patient      Patient will benefit from skilled therapeutic intervention in order to improve the following deficits and impairments:  Abnormal gait, Decreased range of motion, Pain, Decreased strength  Visit Diagnosis: Chronic right-sided low back pain without sciatica     Problem List Patient Active Problem List   Diagnosis Date Noted  . Unilateral primary osteoarthritis, right knee 12/01/2015  . Status post total right knee replacement 12/01/2015    Wynelle Fanny, PTA 06/04/2016, 4:50 PM  [facility] [place], [name], [address] Phone: [phone]   Fax:  [phone]  Name: Javarion Douty MRN: [id number] Date of Birth: 1956-01-13

## 2016-06-06 ENCOUNTER — Encounter: Payer: BLUE CROSS/BLUE SHIELD | Admitting: Physical Therapy

## 2016-06-08 ENCOUNTER — Other Ambulatory Visit (INDEPENDENT_AMBULATORY_CARE_PROVIDER_SITE_OTHER): Payer: Self-pay | Admitting: Physician Assistant

## 2016-06-10 NOTE — Telephone Encounter (Signed)
Please advise 

## 2016-06-12 NOTE — Telephone Encounter (Signed)
I called and left voicemail to advise that muscle relaxer approved and sent to pharmacy. [facility] in [place].

## 2016-06-13 ENCOUNTER — Ambulatory Visit: Payer: BLUE CROSS/BLUE SHIELD | Admitting: Physical Therapy

## 2016-06-13 DIAGNOSIS — M545 Low back pain: Principal | ICD-10-CM

## 2016-06-13 DIAGNOSIS — M25562 Pain in left knee: Secondary | ICD-10-CM

## 2016-06-13 DIAGNOSIS — R2689 Other abnormalities of gait and mobility: Secondary | ICD-10-CM | POA: Diagnosis not present

## 2016-06-13 DIAGNOSIS — G8929 Other chronic pain: Secondary | ICD-10-CM | POA: Diagnosis not present

## 2016-06-13 NOTE — Therapy (Signed)
[facility] [place], [name], [address] Phone: [phone]   Fax:  [phone]  Physical Therapy Treatment  Patient Details  Name: Macai Sisneros MRN: [id number] Date of Birth: [birth year] Referring Provider: Dianna Rossetti NP  Encounter Date: 06/13/2016      PT End of Session - 06/13/16 1748    Visit Number 10   Number of Visits 12   Date for PT Re-Evaluation 06/10/16   PT Start Time 0400   PT Stop Time 0442  Patient requested only e'stim and U/S.   PT Time Calculation (min) 42 min   Activity Tolerance Patient tolerated treatment well   Behavior During Therapy [facility] for tasks assessed/performed      Past Medical History:  Diagnosis Date  . Arthritis    knees  . Bladder spasms   . BPH (benign prostatic hyperplasia)   . DVT (deep venous thrombosis) ([place]) ocyt 2016   left leg tx medically  . GERD (gastroesophageal reflux disease)   . History of colon polyps    hyperplastic 06-14-2010  . Hydronephrosis   . Hypertension   . Urinary retention     Past Surgical History:  Procedure Laterality Date  . COLONOSCOPY  06/14/2010  . CYSTOSCOPY W/ RETROGRADES Bilateral 10/03/2015   Procedure: CYSTOSCOPY WITH RETROGRADE PYELOGRAM;  Surgeon: [name], MD;  Location: [facility];  Service: Urology;  Laterality: Bilateral;  . GREEN LIGHT LASER TURP (TRANSURETHRAL RESECTION OF PROSTATE N/A 10/03/2015   Procedure: GREEN LIGHT LASER,  TURP - STAGED(TRANSURETHRAL RESECTION OF PROSTATE;  Surgeon: [name], MD;  Location: [facility];  Service: Urology;  Laterality: N/A;  . KNEE ARTHROSCOPY     not sure which knee  . QUADRICEPS TENDON REPAIR Bilateral left 02/24/2003/   right 2006  . TOTAL KNEE ARTHROPLASTY Right 12/01/2015   Procedure: RIGHT TOTAL KNEE ARTHROPLASTY;  Surgeon: [name], MD;  Location[facility];  Service: Orthopedics;  Laterality: Right;    There were no vitals  filed for this visit.      Subjective Assessment - 06/13/16 1746    Subjective My knee hurts s I was working in the yard last weekend.   Pain Score 4    Pain Location Back   Pain Orientation Right;Lower   Pain Descriptors / Indicators Aching                         [facility] Adult PT Treatment/Exercise - 06/13/16 0001      Modalities   Modalities Electrical Stimulation     Moist Heat Therapy   Number Minutes Moist Heat 20 Minutes   Moist Heat Location Lumbar Spine     Electrical Stimulation   Electrical Stimulation Location Low back.   Electrical Stimulation Action IFC   Electrical Stimulation Parameters 80-150 Hz x 20 minutes.   Electrical Stimulation Goals Pain     Traction   Type of Traction Lumbar   Min (lbs) 5   Max (lbs) 150   Hold Time 99   Rest Time 5   Time 15                     PT Long Term Goals - 05/30/16 1649      PT LONG TERM GOAL #1   Title I with HEP   Time 6   Period Weeks   Status On-going     PT LONG TERM GOAL #2   Title  Patient able to perform ADLS with LBP 1/11 or less.   Time 6   Period Weeks   Status On-going     PT LONG TERM GOAL #3   Title Patient able to stand for 30 minutes comfortably.   Time 6   Period Weeks   Status On-going     PT LONG TERM GOAL #4   Title Patient able to ambulate without R trunk lean.   Time 8   Period Weeks   Status On-going               Plan - 06/13/16 1754    Clinical Impression Statement No pain report after treatment today.      Patient will benefit from skilled therapeutic intervention in order to improve the following deficits and impairments:  Abnormal gait, Decreased range of motion, Pain, Decreased strength  Visit Diagnosis: Chronic right-sided low back pain without sciatica  Chronic pain of left knee     Problem List Patient Active Problem List   Diagnosis Date Noted  . Unilateral primary osteoarthritis, right knee 12/01/2015  . Status post  total right knee replacement 12/01/2015    Shelena Castelluccio, [place]  MPT 06/13/2016, 6:01 PM  [facility] [place], [name], [address] Phone: [phone]   Fax:  [phone]  Name: Jabir Dahlem MRN: [id number] Date of Birth: 1955-02-12

## 2016-06-18 ENCOUNTER — Ambulatory Visit: Payer: BLUE CROSS/BLUE SHIELD | Admitting: *Deleted

## 2016-06-18 DIAGNOSIS — M545 Low back pain: Secondary | ICD-10-CM

## 2016-06-18 DIAGNOSIS — G8929 Other chronic pain: Secondary | ICD-10-CM | POA: Diagnosis not present

## 2016-06-18 DIAGNOSIS — M25562 Pain in left knee: Principal | ICD-10-CM

## 2016-06-18 DIAGNOSIS — R2689 Other abnormalities of gait and mobility: Secondary | ICD-10-CM | POA: Diagnosis not present

## 2016-06-18 NOTE — Therapy (Signed)
[facility] [place], [name], [address] Phone: [phone]   Fax:  [phone]  Physical Therapy Treatment  Patient Details  Name: Stephen Maldonado MRN: [id number] Date of Birth: 07-05-1955 Referring Provider: Dianna Rossetti NP  Encounter Date: 06/18/2016      PT End of Session - 06/18/16 1627    Visit Number 11   Number of Visits 12   Date for PT Re-Evaluation 06/10/16   PT Start Time 1600   PT Stop Time 1648   PT Time Calculation (min) 48 min      Past Medical History:  Diagnosis Date  . Arthritis    knees  . Bladder spasms   . BPH (benign prostatic hyperplasia)   . DVT (deep venous thrombosis) ([place]) ocyt 2016   left leg tx medically  . GERD (gastroesophageal reflux disease)   . History of colon polyps    hyperplastic 06-14-2010  . Hydronephrosis   . Hypertension   . Urinary retention     Past Surgical History:  Procedure Laterality Date  . COLONOSCOPY  06/14/2010  . CYSTOSCOPY W/ RETROGRADES Bilateral 10/03/2015   Procedure: CYSTOSCOPY WITH RETROGRADE PYELOGRAM;  Surgeon: [name], MD;  Location: [facility];  Service: Urology;  Laterality: Bilateral;  . GREEN LIGHT LASER TURP (TRANSURETHRAL RESECTION OF PROSTATE N/A 10/03/2015   Procedure: GREEN LIGHT LASER,  TURP - STAGED(TRANSURETHRAL RESECTION OF PROSTATE;  Surgeon: [name], MD;  Location: [facility];  Service: Urology;  Laterality: N/A;  . KNEE ARTHROSCOPY     not sure which knee  . QUADRICEPS TENDON REPAIR Bilateral left 02/24/2003/   right 2006  . TOTAL KNEE ARTHROPLASTY Right 12/01/2015   Procedure: RIGHT TOTAL KNEE ARTHROPLASTY;  Surgeon: [name], MD;  Location: [facility];  Service: Orthopedics;  Laterality: Right;    There were no vitals filed for this visit.                       [facility] Adult PT Treatment/Exercise - 06/18/16 0001      Modalities   Modalities  Electrical Stimulation     Moist Heat Therapy   Number Minutes Moist Heat 20 Minutes   Moist Heat Location Lumbar Spine     Electrical Stimulation   Electrical Stimulation Location Bilateral low back.  IFC x 15 mins 80-150hz  in sitting   Electrical Stimulation Goals Pain     Traction   Type of Traction Lumbar   Min (lbs) 5   Max (lbs) 150   Hold Time 99   Rest Time 5   Time 15                     PT Long Term Goals - 05/30/16 1649      PT LONG TERM GOAL #1   Title I with HEP   Time 6   Period Weeks   Status On-going     PT LONG TERM GOAL #2   Title Patient able to perform ADLS with LBP 2/62 or less.   Time 6   Period Weeks   Status On-going     PT LONG TERM GOAL #3   Title Patient able to stand for 30 minutes comfortably.   Time 6   Period Weeks   Status On-going     PT LONG TERM GOAL #4   Title Patient able to ambulate without R trunk lean.   Time 8   Period Weeks  Status On-going               Plan - 06/18/16 1627    Clinical Impression Statement Pt arrived today doing fairly well with less LBP. He did well with Pelvic traction at 150#s again and normal response to modalities. 1 visit left   Rehab Potential Good   Clinical Impairments Affecting Rehab Potential chronic Left knee pain   PT Frequency 2x / week   PT Duration 6 weeks   PT Treatment/Interventions Traction;Electrical Stimulation;Ultrasound;Dry needling   PT Next Visit Plan Dry needling; STW/m; Combo e'stm/U/S.  Int traction beginning at 35% is also an option.. Try 150#s again.  1 visit left and then DC    Consulted and Agree with Plan of Care Patient      Patient will benefit from skilled therapeutic intervention in order to improve the following deficits and impairments:  Abnormal gait, Decreased range of motion, Pain, Decreased strength  Visit Diagnosis: Chronic pain of left knee  Chronic right-sided low back pain without sciatica  Other abnormalities of gait and  mobility     Problem List Patient Active Problem List   Diagnosis Date Noted  . Unilateral primary osteoarthritis, right knee 12/01/2015  . Status post total right knee replacement 12/01/2015    Charlese Gruetzmacher,CHRIS, PTA 06/18/2016, 4:51 PM  [facility] [place], [name], [address] Phone: [phone]   Fax:  [phone]  Name: Stephen Maldonado MRN: [id number] Date of Birth: 27-Nov-1955

## 2016-06-20 ENCOUNTER — Encounter: Payer: BLUE CROSS/BLUE SHIELD | Admitting: *Deleted

## 2016-06-20 DIAGNOSIS — E559 Vitamin D deficiency, unspecified: Secondary | ICD-10-CM | POA: Diagnosis not present

## 2016-06-20 DIAGNOSIS — I10 Essential (primary) hypertension: Secondary | ICD-10-CM | POA: Diagnosis not present

## 2016-06-20 DIAGNOSIS — M5441 Lumbago with sciatica, right side: Secondary | ICD-10-CM | POA: Diagnosis not present

## 2016-06-20 DIAGNOSIS — R972 Elevated prostate specific antigen [PSA]: Secondary | ICD-10-CM | POA: Diagnosis not present

## 2016-06-20 DIAGNOSIS — M5442 Lumbago with sciatica, left side: Secondary | ICD-10-CM | POA: Diagnosis not present

## 2016-06-25 ENCOUNTER — Ambulatory Visit: Payer: BLUE CROSS/BLUE SHIELD | Admitting: *Deleted

## 2016-06-25 ENCOUNTER — Encounter: Payer: Self-pay | Admitting: *Deleted

## 2016-06-25 DIAGNOSIS — R2689 Other abnormalities of gait and mobility: Secondary | ICD-10-CM | POA: Diagnosis not present

## 2016-06-25 DIAGNOSIS — M545 Low back pain, unspecified: Secondary | ICD-10-CM

## 2016-06-25 DIAGNOSIS — G8929 Other chronic pain: Secondary | ICD-10-CM | POA: Diagnosis not present

## 2016-06-25 DIAGNOSIS — M25562 Pain in left knee: Secondary | ICD-10-CM | POA: Diagnosis not present

## 2016-06-25 NOTE — Therapy (Signed)
[facility] [place], [name], [address] Phone: [phone]   Fax:  [phone]  Physical Therapy Treatment  Patient Details  Name: Stephen Maldonado MRN: [id number] Date of Birth: 1956-01-06 Referring Provider: Dianna Rossetti NP  Encounter Date: 06/25/2016      PT End of Session - 06/25/16 1615    Visit Number 12   Number of Visits 12   PT Start Time 1600   PT Stop Time 1650   PT Time Calculation (min) 50 min      Past Medical History:  Diagnosis Date  . Arthritis    knees  . Bladder spasms   . BPH (benign prostatic hyperplasia)   . DVT (deep venous thrombosis) ([place]) ocyt 2016   left leg tx medically  . GERD (gastroesophageal reflux disease)   . History of colon polyps    hyperplastic 06-14-2010  . Hydronephrosis   . Hypertension   . Urinary retention     Past Surgical History:  Procedure Laterality Date  . COLONOSCOPY  06/14/2010  . CYSTOSCOPY W/ RETROGRADES Bilateral 10/03/2015   Procedure: CYSTOSCOPY WITH RETROGRADE PYELOGRAM;  Surgeon: [name], MD;  Location: [facility];  Service: Urology;  Laterality: Bilateral;  . GREEN LIGHT LASER TURP (TRANSURETHRAL RESECTION OF PROSTATE N/A 10/03/2015   Procedure: GREEN LIGHT LASER,  TURP - STAGED(TRANSURETHRAL RESECTION OF PROSTATE;  Surgeon: [name], MD;  Location: [facility];  Service: Urology;  Laterality: N/A;  . KNEE ARTHROSCOPY     not sure which knee  . QUADRICEPS TENDON REPAIR Bilateral left 02/24/2003/   right 2006  . TOTAL KNEE ARTHROPLASTY Right 12/01/2015   Procedure: RIGHT TOTAL KNEE ARTHROPLASTY;  Surgeon: [name], MD;  Location: [facility];  Service: Orthopedics;  Laterality: Right;    There were no vitals filed for this visit.      Subjective Assessment - 06/25/16 1614    Subjective LB is doing better   Pertinent History R knee TKR 12/01/15, HTN   Limitations Standing                          [facility] Adult PT Treatment/Exercise - 06/25/16 0001      Modalities   Modalities Electrical Stimulation;Moist Heat     Moist Heat Therapy   Number Minutes Moist Heat 20 Minutes   Moist Heat Location Lumbar Spine     Electrical Stimulation   Electrical Stimulation Location Bilateral low back.  IFC x 15 mins 80-150hz  in sitting   Electrical Stimulation Goals Pain     Traction   Type of Traction Lumbar   Min (lbs) 5   Max (lbs) 150   Hold Time 99   Rest Time 5   Time 15                     PT Long Term Goals - 06/25/16 1617      PT LONG TERM GOAL #1   Title I with HEP   Time 6   Period Weeks   Status Achieved     PT LONG TERM GOAL #2   Title Patient able to perform ADLS with LBP 2/35 or less.   Time 6   Period Weeks   Status Not Met  pain 5-6/10 at times     PT LONG TERM GOAL #3   Title Patient able to stand for 30 minutes comfortably.   Time 6   Period  Weeks   Status Achieved     PT LONG TERM GOAL #4   Title Patient able to ambulate without R trunk lean.   Period Weeks   Status Achieved               Plan - 06/25/16 1616    Clinical Impression Statement Pt has met 3/4 LTGs. Unable to meet LTG for ADLs for pain rises> 2/10 (5-6/10). His CC is his pain in LT knee when trying to walk on uneven terrain or kneeling. He has purchased an inversion table to help with pain management for LB.  DC pt at this time.   Rehab Potential Good   Clinical Impairments Affecting Rehab Potential chronic Left knee pain   PT Frequency 2x / week   PT Duration 6 weeks   PT Treatment/Interventions Traction;Electrical Stimulation;Ultrasound;Dry needling   PT Next Visit Plan DC to HEP/ inversion table   Consulted and Agree with Plan of Care Patient      Patient will benefit from skilled therapeutic intervention in order to improve the following deficits and impairments:  Abnormal gait, Decreased range of motion, Pain,  Decreased strength  Visit Diagnosis: Chronic right-sided low back pain without sciatica  Other abnormalities of gait and mobility     Problem List Patient Active Problem List   Diagnosis Date Noted  . Unilateral primary osteoarthritis, right knee 12/01/2015  . Status post total right knee replacement 12/01/2015    Justo Hengel,CHRIS, PTA 06/25/2016, 5:18 PM  [facility] [address] [place], [name], [address] Phone: [phone]   Fax:  [phone]  Name: Shameer Molstad MRN: [id number] Date of Birth: 09/19/1955  PHYSICAL THERAPY DISCHARGE SUMMARY  Visits from Start of Care: 12.  Current functional level related to goals / functional outcomes: See above.   Remaining deficits: All but goal #2 met.   Education / Equipment: HEP. Plan: Patient agrees to discharge.  Patient goals were partially met. Patient is being discharged due to being pleased with the current functional level.  ?????         [name] MPT

## 2016-06-26 ENCOUNTER — Ambulatory Visit (INDEPENDENT_AMBULATORY_CARE_PROVIDER_SITE_OTHER): Payer: BLUE CROSS/BLUE SHIELD | Admitting: Orthopaedic Surgery

## 2016-06-26 ENCOUNTER — Ambulatory Visit (INDEPENDENT_AMBULATORY_CARE_PROVIDER_SITE_OTHER): Payer: Self-pay

## 2016-06-26 DIAGNOSIS — Z96651 Presence of right artificial knee joint: Secondary | ICD-10-CM | POA: Insufficient documentation

## 2016-06-26 DIAGNOSIS — M1712 Unilateral primary osteoarthritis, left knee: Secondary | ICD-10-CM | POA: Diagnosis not present

## 2016-06-26 NOTE — Progress Notes (Signed)
The patient is well-known to me. He has severe osteoporosis of his left knee and injections have not helped at this point. He has weak quadriceps muscles is his right knee is doing well but does click a lot. The right knee replaced 7 months ago.  On exam his right knee there is a lot of clicking his knee but his quads are weak. Is a little bit of laxity but is just minimal on drawer sign. His left knee has severe medial joint line tenderness and varus

## 2016-07-05 DIAGNOSIS — R5383 Other fatigue: Secondary | ICD-10-CM | POA: Diagnosis not present

## 2016-07-05 DIAGNOSIS — R609 Edema, unspecified: Secondary | ICD-10-CM | POA: Diagnosis not present

## 2016-07-05 DIAGNOSIS — R030 Elevated blood-pressure reading, without diagnosis of hypertension: Secondary | ICD-10-CM | POA: Diagnosis not present

## 2016-07-10 ENCOUNTER — Inpatient Hospital Stay (HOSPITAL_COMMUNITY)
Admission: EM | Admit: 2016-07-10 | Discharge: 2016-07-12 | DRG: 287 | Disposition: A | Discharge status: Home / Self Care | Payer: BLUE CROSS/BLUE SHIELD | Attending: Cardiovascular Disease | Admitting: Cardiovascular Disease

## 2016-07-10 ENCOUNTER — Encounter (HOSPITAL_COMMUNITY): Payer: Self-pay

## 2016-07-10 ENCOUNTER — Inpatient Hospital Stay (HOSPITAL_COMMUNITY): Payer: BLUE CROSS/BLUE SHIELD

## 2016-07-10 ENCOUNTER — Emergency Department (HOSPITAL_COMMUNITY): Payer: BLUE CROSS/BLUE SHIELD

## 2016-07-10 DIAGNOSIS — Z96651 Presence of right artificial knee joint: Secondary | ICD-10-CM | POA: Diagnosis present

## 2016-07-10 DIAGNOSIS — Z79899 Other long term (current) drug therapy: Secondary | ICD-10-CM | POA: Diagnosis not present

## 2016-07-10 DIAGNOSIS — R079 Chest pain, unspecified: Secondary | ICD-10-CM | POA: Diagnosis not present

## 2016-07-10 DIAGNOSIS — E669 Obesity, unspecified: Secondary | ICD-10-CM | POA: Diagnosis not present

## 2016-07-10 DIAGNOSIS — N400 Enlarged prostate without lower urinary tract symptoms: Secondary | ICD-10-CM | POA: Diagnosis present

## 2016-07-10 DIAGNOSIS — K219 Gastro-esophageal reflux disease without esophagitis: Secondary | ICD-10-CM | POA: Diagnosis present

## 2016-07-10 DIAGNOSIS — N183 Chronic kidney disease, stage 3 (moderate): Secondary | ICD-10-CM | POA: Diagnosis not present

## 2016-07-10 DIAGNOSIS — I350 Nonrheumatic aortic (valve) stenosis: Secondary | ICD-10-CM | POA: Diagnosis not present

## 2016-07-10 DIAGNOSIS — R071 Chest pain on breathing: Secondary | ICD-10-CM | POA: Diagnosis not present

## 2016-07-10 DIAGNOSIS — I10 Essential (primary) hypertension: Secondary | ICD-10-CM | POA: Diagnosis not present

## 2016-07-10 DIAGNOSIS — Z7982 Long term (current) use of aspirin: Secondary | ICD-10-CM

## 2016-07-10 DIAGNOSIS — I129 Hypertensive chronic kidney disease with stage 1 through stage 4 chronic kidney disease, or unspecified chronic kidney disease: Secondary | ICD-10-CM | POA: Diagnosis present

## 2016-07-10 DIAGNOSIS — R7989 Other specified abnormal findings of blood chemistry: Secondary | ICD-10-CM

## 2016-07-10 DIAGNOSIS — Z720 Tobacco use: Secondary | ICD-10-CM

## 2016-07-10 DIAGNOSIS — R072 Precordial pain: Secondary | ICD-10-CM | POA: Diagnosis not present

## 2016-07-10 DIAGNOSIS — R778 Other specified abnormalities of plasma proteins: Secondary | ICD-10-CM

## 2016-07-10 DIAGNOSIS — R0602 Shortness of breath: Secondary | ICD-10-CM | POA: Diagnosis not present

## 2016-07-10 DIAGNOSIS — M1990 Unspecified osteoarthritis, unspecified site: Secondary | ICD-10-CM | POA: Diagnosis not present

## 2016-07-10 DIAGNOSIS — I2510 Atherosclerotic heart disease of native coronary artery without angina pectoris: Principal | ICD-10-CM | POA: Diagnosis present

## 2016-07-10 DIAGNOSIS — I249 Acute ischemic heart disease, unspecified: Secondary | ICD-10-CM | POA: Diagnosis present

## 2016-07-10 LAB — POCT I-STAT TROPONIN I
TROPONIN I, POC: 0.25 ng/mL — AB (ref 0.00–0.08)
Troponin i, poc: 0.12 ng/mL (ref 0.00–0.08)

## 2016-07-10 LAB — BASIC METABOLIC PANEL
ANION GAP: 6 (ref 5–15)
BUN: 26 mg/dL — ABNORMAL HIGH (ref 6–20)
CO2: 28 mmol/L (ref 22–32)
Calcium: 9.1 mg/dL (ref 8.9–10.3)
Chloride: 105 mmol/L (ref 101–111)
Creatinine, Ser: 1.93 mg/dL — ABNORMAL HIGH (ref 0.61–1.24)
GFR calc Af Amer: 41 mL/min — ABNORMAL LOW (ref >60)
GFR calc non Af Amer: 36 mL/min — ABNORMAL LOW (ref >60)
GLUCOSE: 102 mg/dL — AB (ref 65–99)
POTASSIUM: 4.1 mmol/L (ref 3.5–5.1)
Sodium: 139 mmol/L (ref 135–145)

## 2016-07-10 LAB — CBC
HCT: 42.3 % (ref 39.0–52.0)
Hemoglobin: 14.1 g/dL (ref 13.0–17.0)
MCH: 29.3 pg (ref 26.0–34.0)
MCHC: 33.3 g/dL (ref 30.0–36.0)
MCV: 87.9 fL (ref 78.0–100.0)
Platelets: 200 10*3/uL (ref 150–400)
RBC: 4.81 MIL/uL (ref 4.22–5.81)
RDW: 14 % (ref 11.5–15.5)
WBC: 6.7 10*3/uL (ref 4.0–10.5)

## 2016-07-10 LAB — PROTIME-INR
INR: 0.93
PROTHROMBIN TIME: 12.4 s (ref 11.4–15.2)

## 2016-07-10 LAB — TROPONIN I: TROPONIN I: 0.25 ng/mL — AB (ref <0.03)

## 2016-07-10 LAB — BRAIN NATRIURETIC PEPTIDE: B NATRIURETIC PEPTIDE 5: 49.7 pg/mL (ref 0.0–100.0)

## 2016-07-10 LAB — HEPARIN LEVEL (UNFRACTIONATED): Heparin Unfractionated: 0.19 IU/mL — ABNORMAL LOW (ref 0.30–0.70)

## 2016-07-10 MED ORDER — METOPROLOL TARTRATE 12.5 MG HALF TABLET
12.5000 mg | ORAL_TABLET | Freq: Two times a day (BID) | ORAL | Status: DC
  Administered 2016-07-10 – 2016-07-12 (×4): 12.5 mg via ORAL
  Filled 2016-07-10 (×4): qty 1

## 2016-07-10 MED ORDER — GARLIC 1000 MG PO CAPS: 1000.0000 mg | ORAL_CAPSULE | Freq: Every day | ORAL | Status: DC

## 2016-07-10 MED ORDER — COQ10 100 MG PO CAPS: 100.0000 mg | ORAL_CAPSULE | Freq: Every day | ORAL | Status: DC

## 2016-07-10 MED ORDER — SODIUM CHLORIDE 0.9% FLUSH
3.0000 mL | Freq: Two times a day (BID) | INTRAVENOUS | Status: DC
  Administered 2016-07-10: 3 mL via INTRAVENOUS

## 2016-07-10 MED ORDER — VITAMIN B-12 1000 MCG PO TABS
1000.0000 ug | ORAL_TABLET | Freq: Every day | ORAL | Status: DC
  Administered 2016-07-11 – 2016-07-12 (×2): 1000 ug via ORAL
  Filled 2016-07-10 (×2): qty 1

## 2016-07-10 MED ORDER — HEPARIN (PORCINE) IN NACL 100-0.45 UNIT/ML-% IJ SOLN
1650.0000 [IU]/h | INTRAMUSCULAR | Status: DC
  Administered 2016-07-10 – 2016-07-11 (×2): 1650 [IU]/h via INTRAVENOUS
  Filled 2016-07-10: qty 250

## 2016-07-10 MED ORDER — GUAIFENESIN ER 600 MG PO TB12
600.0000 mg | ORAL_TABLET | Freq: Two times a day (BID) | ORAL | Status: DC
  Administered 2016-07-10 – 2016-07-12 (×4): 600 mg via ORAL
  Filled 2016-07-10 (×4): qty 1

## 2016-07-10 MED ORDER — SODIUM CHLORIDE 0.9 % IV SOLN: 250.0000 mL | INTRAVENOUS | Status: DC | PRN

## 2016-07-10 MED ORDER — HYDRALAZINE HCL 25 MG PO TABS
25.0000 mg | ORAL_TABLET | Freq: Two times a day (BID) | ORAL | Status: DC
  Administered 2016-07-10 – 2016-07-12 (×4): 25 mg via ORAL
  Filled 2016-07-10 (×4): qty 1

## 2016-07-10 MED ORDER — ATORVASTATIN CALCIUM 40 MG PO TABS
40.0000 mg | ORAL_TABLET | Freq: Every day | ORAL | Status: DC
  Administered 2016-07-10 – 2016-07-11 (×2): 40 mg via ORAL
  Filled 2016-07-10 (×2): qty 1

## 2016-07-10 MED ORDER — TAMSULOSIN HCL 0.4 MG PO CAPS
0.4000 mg | ORAL_CAPSULE | Freq: Every morning | ORAL | Status: DC
  Administered 2016-07-11 – 2016-07-12 (×2): 0.4 mg via ORAL
  Filled 2016-07-10 (×2): qty 1

## 2016-07-10 MED ORDER — ACETAMINOPHEN 325 MG PO TABS: 650.0000 mg | ORAL_TABLET | ORAL | Status: DC | PRN

## 2016-07-10 MED ORDER — HEPARIN BOLUS VIA INFUSION
4000.0000 [IU] | Freq: Once | INTRAVENOUS | Status: AC
  Administered 2016-07-10: 4000 [IU] via INTRAVENOUS
  Filled 2016-07-10: qty 4000

## 2016-07-10 MED ORDER — HEPARIN BOLUS VIA INFUSION
3000.0000 [IU] | Freq: Once | INTRAVENOUS | Status: AC
  Administered 2016-07-10: 3000 [IU] via INTRAVENOUS
  Filled 2016-07-10: qty 3000

## 2016-07-10 MED ORDER — NITROGLYCERIN 0.4 MG SL SUBL: 0.4000 mg | SUBLINGUAL_TABLET | SUBLINGUAL | Status: DC | PRN

## 2016-07-10 MED ORDER — DILTIAZEM HCL ER COATED BEADS 240 MG PO CP24
240.0000 mg | ORAL_CAPSULE | Freq: Every day | ORAL | Status: DC
  Administered 2016-07-11 – 2016-07-12 (×2): 240 mg via ORAL
  Filled 2016-07-10 (×2): qty 1

## 2016-07-10 MED ORDER — VITAMIN C 500 MG PO TABS
1000.0000 mg | ORAL_TABLET | Freq: Every day | ORAL | Status: DC
  Administered 2016-07-11 – 2016-07-12 (×2): 1000 mg via ORAL
  Filled 2016-07-10 (×2): qty 2

## 2016-07-10 MED ORDER — OXYCODONE-ACETAMINOPHEN 5-325 MG PO TABS: 1.0000 | ORAL_TABLET | Freq: Three times a day (TID) | ORAL | Status: DC | PRN

## 2016-07-10 MED ORDER — SODIUM CHLORIDE 0.9 % IV SOLN
INTRAVENOUS | Status: DC
  Administered 2016-07-10: 20:00:00 via INTRAVENOUS

## 2016-07-10 MED ORDER — VITAMIN D 1000 UNITS PO TABS
1000.0000 [IU] | ORAL_TABLET | Freq: Every day | ORAL | Status: DC
  Administered 2016-07-11 – 2016-07-12 (×2): 1000 [IU] via ORAL
  Filled 2016-07-10 (×2): qty 1

## 2016-07-10 MED ORDER — SODIUM CHLORIDE 0.9% FLUSH: 3.0000 mL | INTRAVENOUS | Status: DC | PRN

## 2016-07-10 MED ORDER — ONDANSETRON HCL 4 MG/2ML IJ SOLN: 4.0000 mg | Freq: Four times a day (QID) | INTRAMUSCULAR | Status: DC | PRN

## 2016-07-10 MED ORDER — HEPARIN (PORCINE) IN NACL 100-0.45 UNIT/ML-% IJ SOLN
1300.0000 [IU]/h | INTRAMUSCULAR | Status: DC
  Administered 2016-07-10 (×2): 1300 [IU]/h via INTRAVENOUS
  Filled 2016-07-10: qty 250

## 2016-07-10 MED ORDER — ASPIRIN EC 81 MG PO TBEC
81.0000 mg | DELAYED_RELEASE_TABLET | Freq: Every day | ORAL | Status: DC
  Administered 2016-07-11 – 2016-07-12 (×3): 81 mg via ORAL
  Filled 2016-07-10 (×3): qty 1

## 2016-07-10 MED ORDER — TURMERIC 500 MG PO TABS
500.0000 mg | ORAL_TABLET | Freq: Every day | ORAL | Status: DC
Start: 2016-07-10 — End: 2016-07-10

## 2016-07-10 MED ORDER — TIZANIDINE HCL 4 MG PO TABS
4.0000 mg | ORAL_TABLET | Freq: Three times a day (TID) | ORAL | Status: DC | PRN
  Filled 2016-07-10: qty 1

## 2016-07-10 NOTE — H&P (Signed)
Referring Physician:  Jakeob Tullis is an 61 y.o. male.                       Chief Complaint: Chest pain  HPI: 61 year old male with recurrent substernal and left precordial chest pain, exertional, pressure like and radiating to left shoulder and left arm. He also had sweating spell. Currently patient is pain free. PMH include hypertension, arthritis, DVT, GERD and CKD, III.   Past Medical History:  Diagnosis Date  . Arthritis    knees  . Bladder spasms   . BPH (benign prostatic hyperplasia)   . DVT (deep venous thrombosis) ([place]) ocyt 2016   left leg tx medically  . GERD (gastroesophageal reflux disease)   . History of colon polyps    hyperplastic 06-14-2010  . Hydronephrosis   . Hypertension   . Urinary retention       Past Surgical History:  Procedure Laterality Date  . COLONOSCOPY  06/14/2010  . CYSTOSCOPY W/ RETROGRADES Bilateral 10/03/2015   Procedure: CYSTOSCOPY WITH RETROGRADE PYELOGRAM;  Surgeon: [name], MD;  Location: [facility];  Service: Urology;  Laterality: Bilateral;  . GREEN LIGHT LASER TURP (TRANSURETHRAL RESECTION OF PROSTATE N/A 10/03/2015   Procedure: GREEN LIGHT LASER,  TURP - STAGED(TRANSURETHRAL RESECTION OF PROSTATE;  Surgeon: [name], MD;  Location: [facility];  Service: Urology;  Laterality: N/A;  . KNEE ARTHROSCOPY     not sure which knee  . QUADRICEPS TENDON REPAIR Bilateral left 02/24/2003/   right 2006  . TOTAL KNEE ARTHROPLASTY Right 12/01/2015   Procedure: RIGHT TOTAL KNEE ARTHROPLASTY;  Surgeon: [name], MD;  Location: [facility];  Service: Orthopedics;  Laterality: Right;    History reviewed. No pertinent family history. Social History:  reports that he has never smoked. He quit smokeless tobacco use about 48 years ago. His smokeless tobacco use included Chew. He reports that he drinks alcohol. He reports that he does not use drugs.  Allergies: No Known Allergies   (Not in a  hospital admission)  Results for orders placed or performed during the hospital encounter of 07/10/16 (from the past 48 hour(s))  Basic metabolic panel     Status: Abnormal   Collection Time: 07/10/16  8:31 AM  Result Value Ref Range   Sodium 139 135 - 145 mmol/L   Potassium 4.1 3.5 - 5.1 mmol/L   Chloride 105 101 - 111 mmol/L   CO2 28 22 - 32 mmol/L   Glucose, Bld 102 (H) 65 - 99 mg/dL   BUN 26 (H) 6 - 20 mg/dL   Creatinine, Ser 1.93 (H) 0.61 - 1.24 mg/dL   Calcium 9.1 8.9 - 10.3 mg/dL   GFR calc non Af Amer 36 (L) >60 mL/min   GFR calc Af Amer 41 (L) >60 mL/min    Comment: (NOTE) The eGFR has been calculated using the CKD EPI equation. This calculation has not been validated in all clinical situations. eGFR's persistently <60 mL/min signify possible Chronic Kidney Disease.    Anion gap 6 5 - 15  CBC     Status: None   Collection Time: 07/10/16  8:31 AM  Result Value Ref Range   WBC 6.7 4.0 - 10.5 K/uL   RBC 4.81 4.22 - 5.81 MIL/uL   Hemoglobin 14.1 13.0 - 17.0 g/dL   HCT 42.3 39.0 - 52.0 %   MCV 87.9 78.0 - 100.0 fL   MCH 29.3 26.0 - 34.0 pg  MCHC 33.3 30.0 - 36.0 g/dL   RDW 14.0 11.5 - 15.5 %   Platelets 200 150 - 400 K/uL  Brain natriuretic peptide     Status: None   Collection Time: 07/10/16  8:31 AM  Result Value Ref Range   B Natriuretic Peptide 49.7 0.0 - 100.0 pg/mL  Protime-INR     Status: None   Collection Time: 07/10/16  8:31 AM  Result Value Ref Range   Prothrombin Time 12.4 11.4 - 15.2 seconds   INR 0.93   POCT i-Stat troponin I     Status: Abnormal   Collection Time: 07/10/16  8:40 AM  Result Value Ref Range   Troponin i, poc 0.12 (HH) 0.00 - 0.08 ng/mL   Comment NOTIFIED PHYSICIAN    Comment 3            Comment: Due to the release kinetics of cTnI, a negative result within the first hours of the onset of symptoms does not rule out myocardial infarction with certainty. If myocardial infarction is still suspected, repeat the test at appropriate  intervals.   POCT i-Stat troponin I     Status: Abnormal   Collection Time: 07/10/16 11:46 AM  Result Value Ref Range   Troponin i, poc 0.25 (HH) 0.00 - 0.08 ng/mL   Comment 3            Comment: Due to the release kinetics of cTnI, a negative result within the first hours of the onset of symptoms does not rule out myocardial infarction with certainty. If myocardial infarction is still suspected, repeat the test at appropriate intervals.    Dg Chest 2 View  Result Date: 07/10/2016 CLINICAL DATA:  Chest pain and shortness of breath EXAM: CHEST  2 VIEW COMPARISON:  October 19, 2015 FINDINGS: Lungs are clear. Heart size and pulmonary vascularity are normal. No pneumothorax. No adenopathy. No bone lesions. IMPRESSION: No edema or consolidation. Electronically Signed   By: Lowella Grip III M.D.   On: 07/10/2016 08:33    Review Of Systems Constitutional: No fever, chills, positive weight gain. Eyes: No vision change, wears glasses. No discharge or pain. Ears: Positive hearing loss, No tinnitus. Respiratory: No asthma, COPD, pneumonias. Positive shortness of breath. No hemoptysis. Cardiovascular: Positive chest pain, no palpitation, leg edema. Gastrointestinal: No nausea, vomiting, diarrhea, constipation. No GI bleed. No hepatitis. Genitourinary: No dysuria, hematuria, kidney stone. No incontinance. Neurological: No headache, stroke, seizures.  Psychiatry: No psych facility admission for anxiety, depression, suicide. No detox. Skin: No rash. Musculoskeletal: Positive joint pain, no fibromyalgia. No neck pain, positive back pain. Lymphadenopathy: No lymphadenopathy. Hematology: No anemia or easy bruising.   Blood pressure (!) 157/70, pulse 64, temperature 97.6 F (36.4 C), temperature source Oral, resp. rate 18, height 5' 11.25" (1.81 m), weight 136.1 kg (300 lb), SpO2 95 %. Body mass index is 41.55 kg/m. General appearance: alert, cooperative, appears stated age and no  distress Head: Normocephalic, atraumatic. Eyes: Hazel eyes, pink conjunctiva, corneas clear. PERRL, EOM's intact. Neck: No adenopathy, no carotid bruit, no JVD, supple, symmetrical, trachea midline and thyroid not enlarged. Resp: Clear to auscultation bilaterally. Chest wall non-tender. Cardio: Regular rate and rhythm, S1, S2 normal, II/VI systolic murmur, no click, rub or gallop. GI: Soft, non-tender; bowel sounds normal; no organomegaly. Extremities: No edema, cyanosis or clubbing. Skin: Warm and dry.  Neurologic: Alert and oriented X 3, normal strength. Normal coordination and gait.  Assessment/Plan Chest pain R/O CAD Hypertension Obesity Arthritis CKD, III  Cardiac cath  v/s nuclear stress test. Patient understood risks, procedure and alternatives.  Birdie Riddle, MD  07/10/2016, 2:18 PM

## 2016-07-10 NOTE — ED Notes (Signed)
Both Morey Hummingbird, RN and EDP [place] notified patient's troponin 0.25

## 2016-07-10 NOTE — ED Notes (Signed)
Main lab called and able to run added labs on to blood already in main lab

## 2016-07-10 NOTE — ED Notes (Signed)
Made Dr Jabier Mutton aware that patient's troponin level increased to 0.25.   Paging Dr Doylene Canard to inform him about patient's elevated troponin level. Awaiting a call back.

## 2016-07-10 NOTE — Progress Notes (Signed)
ANTICOAGULATION CONSULT NOTE - Initial Consult  Pharmacy Consult for Heparin Indication: chest pain/ACS  No Known Allergies  Patient Measurements: Height: 5' 11.25" (181 cm) Weight: 300 lb (136.1 kg) IBW/kg (Calculated) : 75.88 Heparin Dosing Weight: 107 kg  Vital Signs: Temp: 97.6 F (36.4 C) (06/06 0817) Temp Source: Oral (06/06 0817) BP: 170/76 (06/06 1030) Pulse Rate: 56 (06/06 1030)  Labs:  Recent Labs  07/10/16 0831  HGB 14.1  HCT 42.3  PLT 200  LABPROT 12.4  INR 0.93  CREATININE 1.93*    Estimated Creatinine Clearance: 56.9 mL/min (A) (by C-G formula based on SCr of 1.93 mg/dL (H)).   Medical History: Past Medical History:  Diagnosis Date  . Arthritis    knees  . Bladder spasms   . BPH (benign prostatic hyperplasia)   . DVT (deep venous thrombosis) ([place]) ocyt 2016   left leg tx medically  . GERD (gastroesophageal reflux disease)   . History of colon polyps    hyperplastic 06-14-2010  . Hydronephrosis   . Hypertension   . Urinary retention     Medications:  Infusions:   Assessment: 61 yo M admitted with chest pain.  Hx unprovoked DVT ~ 47yr ago for which he took an anticoagulant for 3 months.  VQ scan pending.  Starting heparin for ACS.   Cardiology consulted.  Goal of Therapy:  Heparin level 0.3-0.7 units/ml Monitor platelets by anticoagulation protocol: Yes   Plan:  Give 4000 units bolus x 1 Start heparin infusion at 1300 units/hr Check anti-Xa level in 6 hours and daily while on heparin Continue to monitor H&H and platelets  Biagio Borg 07/10/2016,12:05 PM

## 2016-07-10 NOTE — ED Notes (Signed)
Dr Doylene Canard made aware.  Keep patient NPO at this time until he can verify if cath lab has opening today or not.  Start heparin as ordered.

## 2016-07-10 NOTE — ED Provider Notes (Signed)
Stephen Maldonado   CSN: [id number] Arrival date & time: 07/10/16  0354     History   Chief Complaint Chief Complaint  Patient presents with  . Chest Pain    HPI [name] is a 61 y.o. male.  The history is provided by the patient, medical records and the spouse.  Chest Pain   This is a new problem. The current episode started yesterday. The problem occurs constantly. The problem has been gradually improving. The pain is associated with exertion. The pain is present in the substernal region. The pain is at a severity of 4/10. The pain is moderate. The quality of the pain is described as exertional, heavy, pressure-like and dull. The pain radiates to the left shoulder and left arm. The symptoms are aggravated by exertion. Associated symptoms include cough (dry and chronic), diaphoresis (resolved), exertional chest pressure, lower extremity edema and shortness of breath. Pertinent negatives include no abdominal pain, no back pain, no fever, no headaches, no malaise/fatigue, no nausea, no near-syncope, no numbness, no syncope and no vomiting. He has tried nothing for the symptoms. The treatment provided no relief. Risk factors include male gender and obesity.  His family medical history is significant for CAD and heart disease.    Past Medical History:  Diagnosis Date  . Arthritis    knees  . Bladder spasms   . BPH (benign prostatic hyperplasia)   . DVT (deep venous thrombosis) ([place]) ocyt 2016   left leg tx medically  . GERD (gastroesophageal reflux disease)   . History of colon polyps    hyperplastic 06-14-2010  . Hydronephrosis   . Hypertension   . Urinary retention     Patient Active Problem List   Diagnosis Date Noted  . Unilateral primary osteoarthritis, left knee 06/26/2016  . H/O total knee replacement, right 06/26/2016  . Unilateral primary osteoarthritis, right knee 12/01/2015  . Status post total right knee replacement 12/01/2015    Past  Surgical History:  Procedure Laterality Date  . COLONOSCOPY  06/14/2010  . CYSTOSCOPY W/ RETROGRADES Bilateral 10/03/2015   Procedure: CYSTOSCOPY WITH RETROGRADE PYELOGRAM;  Surgeon: [name], MD;  Location: [facility];  Service: Urology;  Laterality: Bilateral;  . GREEN LIGHT LASER TURP (TRANSURETHRAL RESECTION OF PROSTATE N/A 10/03/2015   Procedure: GREEN LIGHT LASER,  TURP - STAGED(TRANSURETHRAL RESECTION OF PROSTATE;  Surgeon: [name], MD;  Location: [facility];  Service: Urology;  Laterality: N/A;  . KNEE ARTHROSCOPY     not sure which knee  . QUADRICEPS TENDON REPAIR Bilateral left 02/24/2003/   right 2006  . TOTAL KNEE ARTHROPLASTY Right 12/01/2015   Procedure: RIGHT TOTAL KNEE ARTHROPLASTY;  Surgeon: [name], MD;  Location: [facility];  Service: Orthopedics;  Laterality: Right;       Home Medications    Prior to Admission medications   Medication Sig Start Date End Date Taking? Authorizing Provider  acetaminophen (TYLENOL) 500 MG tablet Take 1,000 mg by mouth every 6 (six) hours as needed (for pain.).     [provider]  Cholecalciferol (VITAMIN D-3) 1000 units CAPS Take 1,000 Units by mouth daily.     [provider]  Coenzyme Q10 (COQ10) 100 MG CAPS Take 100 mg by mouth daily.     [provider]  diltiazem (CARDIZEM CD) 240 MG 24 hr capsule Take 240 mg by mouth daily. 11/15/15   [provider]  furosemide (LASIX) 20 MG tablet Take 20 mg by mouth  daily as needed. For fluid retention. 11/08/15   [provider]  Glucosamine-Chondroitin (GLUCOSAMINE CHONDR COMPLEX PO) Take 1 capsule by mouth daily.     [provider]  methocarbamol (ROBAXIN) 500 MG tablet Take 1 tablet (500 mg total) by mouth every 6 (six) hours as needed for muscle spasms. Patient not taking: Reported on 04/29/2016 12/02/15   [name], MD  methylPREDNISolone (MEDROL) 4 MG tablet Take as  directed Patient not taking: Reported on 02/29/2016 02/08/16   Pete Pelt, PA-C  Omega-3 Fatty Acids (FISH OIL) 1000 MG CAPS Take 1,000 mg by mouth daily.     [provider]  oxyCODONE-acetaminophen (ROXICET) 5-325 MG tablet Take 1-2 tablets by mouth every 8 (eight) hours as needed for severe pain. 01/02/16   [name], MD  tamsulosin (FLOMAX) 0.4 MG CAPS capsule Take 0.4 mg by mouth every morning.    [provider]  tiZANidine (ZANAFLEX) 4 MG tablet TAKE 1 TABLET (4 MG TOTAL) BY MOUTH EVERY 8 (EIGHT) HOURS AS NEEDED FOR MUSCLE SPASMS. 06/11/16   Pete Pelt, PA-C  vitamin B-12 (CYANOCOBALAMIN) 1000 MCG tablet Take 1,000 mcg by mouth daily.    [provider]    Family History No family history on file.  Social History Social History  Substance Use Topics  . Smoking status: Never Smoker  . Smokeless tobacco: Former Systems developer    Types: Chew    Quit date: 02/05/1968  . Alcohol use Yes     Comment: 1-2 beers days     Allergies   Patient has no known allergies.   Review of Systems Review of Systems  Constitutional: Positive for diaphoresis (resolved). Negative for appetite change, chills, fatigue, fever and malaise/fatigue.  HENT: Negative for congestion and rhinorrhea.   Eyes: Negative for visual disturbance.  Respiratory: Positive for cough (dry and chronic), chest tightness and shortness of breath. Negative for wheezing and stridor.   Cardiovascular: Positive for chest pain. Negative for syncope and near-syncope.  Gastrointestinal: Negative for abdominal pain, constipation, diarrhea, nausea and vomiting.  Genitourinary: Negative for dysuria and flank pain.  Musculoskeletal: Negative for back pain, neck pain and neck stiffness.  Skin: Negative for wound.  Neurological: Negative for light-headedness, numbness and headaches.  Psychiatric/Behavioral: Negative for agitation.  All other systems reviewed and are negative.    Physical  Exam Updated Vital Signs BP (!) 160/81 (BP Location: Right Arm)   Pulse 68   Temp 97.6 F (36.4 C) (Oral)   Resp 16   SpO2 98%   Physical Exam  Constitutional: He appears well-developed and well-nourished. No distress.  HENT:  Head: Normocephalic and atraumatic.  Mouth/Throat: Oropharynx is clear and moist. No oropharyngeal exudate.  Eyes: Conjunctivae are normal. Pupils are equal, round, and reactive to light.  Neck: Normal range of motion. Neck supple.  Cardiovascular: Normal rate and regular rhythm.   No murmur heard. Pulmonary/Chest: Effort normal and breath sounds normal. No stridor. He has no wheezes. He exhibits no tenderness.  Abdominal: Soft. There is no tenderness.  Musculoskeletal: He exhibits edema. He exhibits no tenderness.  Neurological: He is alert. No sensory deficit. He exhibits normal muscle tone.  Skin: Skin is warm and dry. Capillary refill takes less than 2 seconds. He is not diaphoretic. No pallor.  Psychiatric: He has a normal mood and affect.  Nursing Maldonado and vitals reviewed.    ED Treatments / Results  Labs (all labs ordered are listed, but only abnormal results are displayed) Labs  Reviewed  BASIC METABOLIC PANEL - Abnormal; Notable for the following:       Result Value   Glucose, Bld 102 (*)    BUN 26 (*)    Creatinine, Ser 1.93 (*)    GFR calc non Af Amer 36 (*)    GFR calc Af Amer 41 (*)    All other components within normal limits  POCT I-STAT TROPONIN I - Abnormal; Notable for the following:    Troponin i, poc 0.12 (*)    All other components within normal limits  POCT I-STAT TROPONIN I - Abnormal; Notable for the following:    Troponin i, poc 0.25 (*)    All other components within normal limits  CBC  BRAIN NATRIURETIC PEPTIDE  PROTIME-INR  TROPONIN I  TROPONIN I  BASIC METABOLIC PANEL  LIPID PANEL  PROTIME-INR  HIV ANTIBODY (ROUTINE TESTING)  HEPARIN LEVEL (UNFRACTIONATED)  HEPARIN LEVEL (UNFRACTIONATED)  CBC  I-STAT  TROPOININ, ED  I-STAT TROPOININ, ED    EKG  EKG Interpretation  Date/Time:  Wednesday July 10 2016 08:16:52 EDT Ventricular Rate:  67 PR Interval:    QRS Duration: 107 QT Interval:  441 QTC Calculation: 466 R Axis:   53 Text Interpretation:  Sinus rhythm No significant change since last tracing Confirmed by Dorie Rank [phone]) on 07/10/2016 8:22:30 AM       Radiology Dg Chest 2 View  Result Date: 07/10/2016 CLINICAL DATA:  Chest pain and shortness of breath EXAM: CHEST  2 VIEW COMPARISON:  October 19, 2015 FINDINGS: Lungs are clear. Heart size and pulmonary vascularity are normal. No pneumothorax. No adenopathy. No bone lesions. IMPRESSION: No edema or consolidation. Electronically Signed   By: Lowella Grip III M.D.   On: 07/10/2016 08:33    Procedures Procedures (including critical care time)  CRITICAL CARE Performed by: Gwenyth Allegra Tegeler Total critical care time: 45 minutes Critical care time was exclusive of separately billable procedures and treating other patients. Critical care was necessary to treat or prevent imminent or life-threatening deterioration. Critical care was time spent personally by me on the following activities: development of treatment plan with patient and/or surrogate as well as nursing, discussions with consultants, evaluation of patient's response to treatment, examination of patient, obtaining history from patient or surrogate, ordering and performing treatments and interventions, ordering and review of laboratory studies, ordering and review of radiographic studies, pulse oximetry and re-evaluation of patient's condition.   Medications Ordered in ED Medications  aspirin EC tablet 81 mg (not administered)  nitroGLYCERIN (NITROSTAT) SL tablet 0.4 mg (not administered)  acetaminophen (TYLENOL) tablet 650 mg (not administered)  ondansetron (ZOFRAN) injection 4 mg (not administered)  vitamin C (ASCORBIC ACID) tablet 1,000 mg (not administered)   cholecalciferol (VITAMIN D) tablet 1,000 Units (1,000 Units Oral Not Given 07/10/16 1200)  diltiazem (CARDIZEM CD) 24 hr capsule 240 mg (not administered)  guaiFENesin (MUCINEX) 12 hr tablet 600 mg (not administered)  hydrALAZINE (APRESOLINE) tablet 25 mg (25 mg Oral Given 07/10/16 1705)  oxyCODONE-acetaminophen (PERCOCET/ROXICET) 5-325 MG per tablet 1-2 tablet (not administered)  tamsulosin (FLOMAX) capsule 0.4 mg (0.4 mg Oral Not Given 07/10/16 1200)  tiZANidine (ZANAFLEX) tablet 4 mg (not administered)  vitamin B-12 (CYANOCOBALAMIN) tablet 1,000 mcg (not administered)  metoprolol tartrate (LOPRESSOR) tablet 12.5 mg (12.5 mg Oral Not Given 07/10/16 1330)  atorvastatin (LIPITOR) tablet 40 mg (40 mg Oral Given 07/10/16 1705)  heparin ADULT infusion 100 units/mL (25000 units/26mL sodium chloride 0.45%) (1,300 Units/hr Intravenous New Bag/Given 07/10/16 1228)  sodium chloride flush (NS) 0.9 % injection 3 mL (not administered)  sodium chloride flush (NS) 0.9 % injection 3 mL (not administered)  0.9 %  sodium chloride infusion (not administered)  0.9 %  sodium chloride infusion (not administered)  heparin bolus via infusion 4,000 Units (4,000 Units Intravenous Bolus from Bag 07/10/16 1229)     Initial Impression / Assessment and Plan / ED Course  I have reviewed the triage vital signs and the nursing notes.  Pertinent labs & imaging results that were available during my care of the patient were reviewed by me and considered in my medical decision making (see chart for details).     Stephen Maldonado is a 61 y.o. male with a past medical history significant for hypertension, prior DVT now off of anticoagulation who presents with intermittent chest pain and shortness of breath. Patient reports that yesterday, after a heavy workload of unloading material from a truck, patient began having discomfort in his chest. He described it as a cramping and aching in his central chest that radiates towards his left axilla.  He reports an aching heaviness of his left arm. He reports associated shortness of breath, diaphoresis, lightheadedness, and palpitations during episode. He denies nausea or vomiting. He describes his pain is nonpleuritic but he said it was exertional yesterday. He says that rest improved his symptoms. He did not have pain overnight but then this morning after showering and getting ready, he had return of his discomfort. He describes the pain as a 4 out of 10 in severity, and continues to be in his central chest. He says it was not exertional today. He called his doctor who told him to take 4 aspirin and come to the emergency department.  Patient describes his pain is now a 1 out of 10 in severity. It is very mild. He denies fevers, chills, congestion, cough, urinary symptoms, or changes in bowel movements. He denies recent traumas. He denies any history of cardiac disease but does say that his father had his first heart attack in his 70s. He does not smoke. Patient does report generalized edema in both legs. He says that he had a DVT 1 year ago and took anticoagulation for 3 months. It was an unprovoked DVT. He has not had any symptoms since finishing his anticoagulant course.   History and exam are seen above. On exam, patient's lungs are clear. Chest is nontender. Patient has edema in both legs with no tenderness. Symmetric pulses in his upper extremity. Back and CVA areas nontender. No focal neurologic deficits.  Initial EKG appears unchanged from prior. Patient will have diagnostic laboratory testing and imaging to look for etiology of discomfort.  Given patient's description of symptoms, there is concern for a cardiac etiology. Patient will likely require admission for further cardiac workup.  Diagnostic lab testing returned showing positive troponin of 0.12. BMP showed elevation of creatinine at 1.93. BNP nonelevated. INR normal. Chest x-ray shows no evidence of pneumonia or edema.  9:38  AM Radiology called to report that patient's GFR is too low to do a normal PE study. They offered a decreased dose but due to his weight, they did not feel it would be adequate to look for pulmonary emboli. Patient may need VQ scan but due to his concerning story and positive troponin, cardiology will be called for further management.  Cards evaluated patient and will admit for workup of chest pain. Heparin started and pt transferred to [facility] for further management. Second troponin continued  to rise. Cards called by nursing team.   Pt transferred without complication to [facility] for further workup.     Final Clinical Impressions(s) / ED Diagnoses   Final diagnoses:  Chest pain  Elevated troponin     Clinical Impression: 1. Elevated troponin   2. Chest pain     Disposition: Admit to Cardiology    Tegeler, Gwenyth Allegra, MD 07/10/16 2004

## 2016-07-10 NOTE — ED Notes (Signed)
ED Provider at bedside. 

## 2016-07-10 NOTE — ED Notes (Signed)
Called floor at [place], nurse not able to take report at this time, awaiting a call back.

## 2016-07-10 NOTE — ED Notes (Signed)
admitting MD at bedside

## 2016-07-10 NOTE — Progress Notes (Signed)
PHARMACIST - PHYSICIAN ORDER COMMUNICATION  CONCERNING: P&T Medication Policy on Herbal Medications  DESCRIPTION:  This patient's order for:  CoEnzyme Q11, Tumeric & Garlic has been noted.  This product(s) is classified as an "herbal" or natural product. Due to a lack of definitive safety studies or FDA approval, nonstandard manufacturing practices, plus the potential risk of unknown drug-drug interactions while on inpatient medications, the Pharmacy and Therapeutics Committee does not permit the use of "herbal" or natural products of this type within [facility].   ACTION TAKEN: The pharmacy department is unable to verify this order at this time and your patient has been informed of this safety policy. Please reevaluate patient's clinical condition at discharge and address if the herbal or natural product(s) should be resumed at that time.  Netta Cedars, PharmD, BCPS Pager: [phone] 07/10/2016@1 :32 PM

## 2016-07-10 NOTE — ED Triage Notes (Signed)
Pt c/o intermittent L chest tightness. Pt states tightness worsened this morning and resolved. Pt states difficult at times for him to take deep breath. Pt denies nausea. Pt reports mild pain in triage, no distress. EKG done in triage.

## 2016-07-10 NOTE — Progress Notes (Signed)
ANTICOAGULATION CONSULT NOTE - FOLLOW UP    HL = 0.19 (goal 0.3 - 0.7 units/mL) Heparin dosing weight = 107 kg   Assessment: 60 YOM continues on IV heparin for ACS and rule out PE.  Heparin level is sub-therapeutic.  No interruption nor complication with heparin infusion per RN.  No bleeding reported.   Plan: - Rebolus with heparin 3000 units IV x 1, then - Increase heparin gtt to 1650 units/hr - Check 6 hr heparin level - Daily heparin level and CBC   Lalena Salas D. Mina Marble, PharmD, BCPS 07/10/2016, 8:34 PM

## 2016-07-11 ENCOUNTER — Encounter (HOSPITAL_COMMUNITY): Payer: Self-pay | Admitting: Cardiovascular Disease

## 2016-07-11 ENCOUNTER — Encounter (HOSPITAL_COMMUNITY)
Admission: EM | Disposition: A | Discharge status: Home / Self Care | Payer: Self-pay | Source: Home / Self Care | Attending: Cardiovascular Disease

## 2016-07-11 HISTORY — PX: CORONARY ANGIOGRAPHY: CATH118303

## 2016-07-11 LAB — ECHOCARDIOGRAM COMPLETE
Height: 71 in
WEIGHTICAEL: 3993 [oz_av]

## 2016-07-11 LAB — LIPID PANEL
Cholesterol: 157 mg/dL (ref 0–200)
HDL: 45 mg/dL (ref >40)
LDL CALC: 97 mg/dL (ref 0–99)
TRIGLYCERIDES: 74 mg/dL (ref <150)
Total CHOL/HDL Ratio: 3.5 RATIO
VLDL: 15 mg/dL (ref 0–40)

## 2016-07-11 LAB — CBC
HEMATOCRIT: 41 % (ref 39.0–52.0)
HEMOGLOBIN: 13.4 g/dL (ref 13.0–17.0)
MCH: 29.1 pg (ref 26.0–34.0)
MCHC: 32.7 g/dL (ref 30.0–36.0)
MCV: 89.1 fL (ref 78.0–100.0)
PLATELETS: 197 10*3/uL (ref 150–400)
RBC: 4.6 MIL/uL (ref 4.22–5.81)
RDW: 14.2 % (ref 11.5–15.5)
WBC: 7.3 10*3/uL (ref 4.0–10.5)

## 2016-07-11 LAB — POCT ACTIVATED CLOTTING TIME: ACTIVATED CLOTTING TIME: 136 s

## 2016-07-11 LAB — PROTIME-INR
INR: 0.97
Prothrombin Time: 12.9 seconds (ref 11.4–15.2)

## 2016-07-11 LAB — HEPARIN LEVEL (UNFRACTIONATED): Heparin Unfractionated: 0.51 IU/mL (ref 0.30–0.70)

## 2016-07-11 LAB — BASIC METABOLIC PANEL
Anion gap: 9 (ref 5–15)
BUN: 24 mg/dL — ABNORMAL HIGH (ref 6–20)
CHLORIDE: 100 mmol/L — AB (ref 101–111)
CO2: 28 mmol/L (ref 22–32)
CREATININE: 1.9 mg/dL — AB (ref 0.61–1.24)
Calcium: 8.8 mg/dL — ABNORMAL LOW (ref 8.9–10.3)
GFR calc non Af Amer: 36 mL/min — ABNORMAL LOW (ref >60)
GFR, EST AFRICAN AMERICAN: 42 mL/min — AB (ref >60)
Glucose, Bld: 108 mg/dL — ABNORMAL HIGH (ref 65–99)
Potassium: 4.2 mmol/L (ref 3.5–5.1)
Sodium: 137 mmol/L (ref 135–145)

## 2016-07-11 LAB — MRSA PCR SCREENING: MRSA by PCR: NEGATIVE

## 2016-07-11 LAB — TROPONIN I: TROPONIN I: 0.28 ng/mL — AB (ref <0.03)

## 2016-07-11 SURGERY — CORONARY ANGIOGRAPHY (CATH LAB)
Anesthesia: LOCAL

## 2016-07-11 MED ORDER — HEPARIN (PORCINE) IN NACL 2-0.9 UNIT/ML-% IJ SOLN
INTRAMUSCULAR | Status: AC
  Filled 2016-07-11: qty 1000

## 2016-07-11 MED ORDER — IOPAMIDOL (ISOVUE-370) INJECTION 76%
INTRAVENOUS | Status: AC
  Filled 2016-07-11: qty 100

## 2016-07-11 MED ORDER — SODIUM CHLORIDE 0.9 % IV SOLN: 250.0000 mL | INTRAVENOUS | Status: DC | PRN

## 2016-07-11 MED ORDER — SODIUM CHLORIDE 0.9 % IV SOLN: INTRAVENOUS | Status: AC

## 2016-07-11 MED ORDER — FENTANYL CITRATE (PF) 100 MCG/2ML IJ SOLN
INTRAMUSCULAR | Status: AC
  Filled 2016-07-11: qty 2

## 2016-07-11 MED ORDER — LIDOCAINE HCL 1 % IJ SOLN
INTRAMUSCULAR | Status: AC
  Filled 2016-07-11: qty 20

## 2016-07-11 MED ORDER — SODIUM CHLORIDE 0.9% FLUSH
3.0000 mL | Freq: Two times a day (BID) | INTRAVENOUS | Status: DC
  Administered 2016-07-11 – 2016-07-12 (×3): 3 mL via INTRAVENOUS

## 2016-07-11 MED ORDER — SODIUM CHLORIDE 0.9% FLUSH: 3.0000 mL | INTRAVENOUS | Status: DC | PRN

## 2016-07-11 MED ORDER — IOPAMIDOL (ISOVUE-370) INJECTION 76%
INTRAVENOUS | Status: DC | PRN
  Administered 2016-07-11: 40 mL via INTRAVENOUS

## 2016-07-11 MED ORDER — MIDAZOLAM HCL 2 MG/2ML IJ SOLN
INTRAMUSCULAR | Status: DC | PRN
Start: 2016-07-11 — End: 2016-07-11
  Administered 2016-07-11: 1 mg via INTRAVENOUS

## 2016-07-11 MED ORDER — FENTANYL CITRATE (PF) 100 MCG/2ML IJ SOLN
INTRAMUSCULAR | Status: DC | PRN
  Administered 2016-07-11 (×2): 25 ug via INTRAVENOUS

## 2016-07-11 MED ORDER — HEPARIN (PORCINE) IN NACL 2-0.9 UNIT/ML-% IJ SOLN
INTRAMUSCULAR | Status: AC | PRN
  Administered 2016-07-11: 1000 mL

## 2016-07-11 MED ORDER — LIDOCAINE HCL (PF) 1 % IJ SOLN
INTRAMUSCULAR | Status: DC | PRN
  Administered 2016-07-11: 8 mL
  Administered 2016-07-11: 20 mL

## 2016-07-11 MED ORDER — LIDOCAINE HCL 1 % IJ SOLN
INTRAMUSCULAR | Status: AC
Start: 2016-07-11 — End: 2016-07-11
  Filled 2016-07-11: qty 20

## 2016-07-11 MED ORDER — MIDAZOLAM HCL 2 MG/2ML IJ SOLN
INTRAMUSCULAR | Status: AC
  Filled 2016-07-11: qty 2

## 2016-07-11 MED ORDER — SODIUM CHLORIDE 0.9 % IV SOLN
INTRAVENOUS | Status: DC
  Administered 2016-07-11 (×2): via INTRAVENOUS

## 2016-07-11 SURGICAL SUPPLY — 8 items
CATH INFINITI 5FR MULTPACK ANG (CATHETERS) ×2 IMPLANT
HOVERMATT SINGLE USE (MISCELLANEOUS) ×2 IMPLANT
KIT HEART LEFT (KITS) ×2 IMPLANT
NEEDLE SMART REG 18GX2-3/4 (NEEDLE) ×2 IMPLANT
PACK CARDIAC CATHETERIZATION (CUSTOM PROCEDURE TRAY) ×2 IMPLANT
SHEATH PINNACLE 5F 10CM (SHEATH) ×2 IMPLANT
TRANSDUCER W/STOPCOCK (MISCELLANEOUS) ×2 IMPLANT
WIRE EMERALD 3MM-J .035X150CM (WIRE) ×2 IMPLANT

## 2016-07-11 NOTE — Interval H&P Note (Signed)
History and Physical Interval Note:  07/11/2016 7:34 AM  Stephen Maldonado  has presented today for surgery, with the diagnosis of cp  The various methods of treatment have been discussed with the patient and family. After consideration of risks, benefits and other options for treatment, the patient has consented to  Procedure(s): Left Heart Cath and Coronary Angiography (N/A) as a surgical intervention .  The patient's history has been reviewed, patient examined, no change in status, stable for surgery.  I have reviewed the patient's chart and labs.  Questions were answered to the patient's satisfaction.     Anabelle Bungert S

## 2016-07-11 NOTE — Progress Notes (Addendum)
Site area: RFA Site Prior to Removal:  Level 0 Pressure Applied For:20 min Manual:   yes Patient Status During Pull:  stable Post Pull Site:  Level 0 Post Pull Instructions Given:  yes Post Pull Pulses Present: palpable Dressing Applied:  tegaderm Bedrest begins @ 0900 till 1300 Comments:   

## 2016-07-11 NOTE — Progress Notes (Signed)
ANTICOAGULATION CONSULT NOTE - Follow Up Consult  Pharmacy Consult for Heparin  Indication: ACS, rule out PE  No Known Allergies  Patient Measurements: Height: 5\' 11"  (180.3 cm) Weight: 249 lb 9 oz (113.2 kg) IBW/kg (Calculated) : 75.3  Vital Signs: Temp: 98.6 F (37 C) (06/06 2102) Temp Source: Oral (06/06 2102) BP: 166/62 (06/06 2102) Pulse Rate: 69 (06/06 2102)  Labs:  Recent Labs  07/10/16 0831 07/10/16 1907 07/10/16 1917 07/10/16 2336 07/11/16 0232  HGB 14.1  --   --   --  13.4  HCT 42.3  --   --   --  41.0  PLT 200  --   --   --  197  LABPROT 12.4  --   --   --  12.9  INR 0.93  --   --   --  0.97  HEPARINUNFRC  --   --  0.19*  --  0.51  CREATININE 1.93*  --   --   --   --   TROPONINI  --  0.25*  --  0.28*  --     Estimated Creatinine Clearance: 51.5 mL/min (A) (by C-G formula based on SCr of 1.93 mg/dL (H)).   Assessment: Therapeutic heparin level x 1 after bolus/rate increase  Goal of Therapy:  Heparin level 0.3-0.7 units/ml Monitor platelets by anticoagulation protocol: Yes   Plan:  -Cont heparin 1650 units/hr -1200 HL  Narda Bonds 07/11/2016,3:23 AM

## 2016-07-12 LAB — CBC
HEMATOCRIT: 42.7 % (ref 39.0–52.0)
Hemoglobin: 13.6 g/dL (ref 13.0–17.0)
MCH: 28.8 pg (ref 26.0–34.0)
MCHC: 31.9 g/dL (ref 30.0–36.0)
MCV: 90.5 fL (ref 78.0–100.0)
Platelets: 193 10*3/uL (ref 150–400)
RBC: 4.72 MIL/uL (ref 4.22–5.81)
RDW: 14.1 % (ref 11.5–15.5)
WBC: 7.7 10*3/uL (ref 4.0–10.5)

## 2016-07-12 LAB — BASIC METABOLIC PANEL
ANION GAP: 10 (ref 5–15)
BUN: 25 mg/dL — ABNORMAL HIGH (ref 6–20)
CHLORIDE: 104 mmol/L (ref 101–111)
CO2: 23 mmol/L (ref 22–32)
CREATININE: 1.82 mg/dL — AB (ref 0.61–1.24)
Calcium: 8.9 mg/dL (ref 8.9–10.3)
GFR calc non Af Amer: 38 mL/min — ABNORMAL LOW (ref >60)
GFR, EST AFRICAN AMERICAN: 45 mL/min — AB (ref >60)
Glucose, Bld: 112 mg/dL — ABNORMAL HIGH (ref 65–99)
POTASSIUM: 4.9 mmol/L (ref 3.5–5.1)
Sodium: 137 mmol/L (ref 135–145)

## 2016-07-12 MED ORDER — METOPROLOL TARTRATE 25 MG PO TABS: 12.5000 mg | ORAL_TABLET | Freq: Two times a day (BID) | ORAL | 3 refills | Status: DC

## 2016-07-12 MED ORDER — ASPIRIN 81 MG PO TBEC: 81.0000 mg | DELAYED_RELEASE_TABLET | Freq: Every day | ORAL | Status: DC

## 2016-07-12 MED ORDER — NITROGLYCERIN 0.4 MG SL SUBL: 0.4000 mg | SUBLINGUAL_TABLET | SUBLINGUAL | 1 refills | Status: DC | PRN

## 2016-07-12 MED ORDER — ATORVASTATIN CALCIUM 40 MG PO TABS: 40.0000 mg | ORAL_TABLET | Freq: Every day | ORAL | 3 refills | Status: DC

## 2016-07-12 NOTE — Discharge Summary (Signed)
Physician Discharge Summary  Patient ID: Stephen Maldonado MRN: [id number] DOB/AGE: [birth year] 61 y.o.  Admit date: 07/10/2016 Discharge date: 07/12/2016  Admission Diagnoses: Chest pain Rule out CAD Hypertension Obesity Arthritis CKD, III  Discharge Diagnoses:  Principal problem: * Chest pain * Active Problems:   CAD   Hypertension   Obesity   Arthritis   CKD, III  Discharged Condition: fair  Hospital Course: 61-year-old male had recurrent substernal and left precordial chest pain associated with exertion and radiation. He underwent cardiac catheterization that showed mild coronary artery disease. His echocardiogram showed mild diffuse hypokinesia with ejection fraction of 50%. He had moderate calcification on is aortic valve and mild aortic stenosis. His renal function remained stable post cardiac catheterization and he was discharged home in stable condition with follow-up by me in 1 week and by primary care physician in one month.  Consults: cardiology  Significant Diagnostic Studies: labs: Normal CBC,  normal electrolytes, BUN 26, creatinine 1.93.  LDL cholesterol of 197 mg and total cholesterol of 157 mg.  EKG: Normal sinus rhythm.  Chest x-ray: Normal.  Echocardiogram: Mild LVH and mild diffuse hypokinesis with ejection fraction of 50%. Moderately calcified aortic wall with a mild stenosis. Mild ascending aortic ectasia.  Cardiac catheterization: Mild right coronary artery and left circumflex coronary arteries stenosis.  Treatments: cardiac meds: Aspirin, atorvastatin, sublingual nitroglycerin, metoprolol, diltiazem, furosemide and hydralazine.    Discharge Exam: Blood pressure (!) 171/77, pulse 62, temperature 98.1 F (36.7 C), temperature source Oral, resp. rate 15, height 5\' 11"  (1.803 m), weight 134.6 kg (296 lb 11.2 oz), SpO2 97 %. General appearance: alert, cooperative and appears stated age. Head: Normocephalic, atraumatic. Eyes: Hazel eyes, pink conjunctiva,  corneas clear. PERRL, EOM's intact.  Neck: No adenopathy, no carotid bruit, no JVD, supple, symmetrical, trachea midline and thyroid not enlarged. Resp: Clear to auscultation bilaterally. Cardio: Regular rate and rhythm, S1, S2 normal, II/VI systolic murmur, no click, rub or gallop. GI: Soft, non-tender; bowel sounds normal; no organomegaly. Extremities: No edema, cyanosis or clubbing. no right groin hematoma. Skin: Warm and dry.  Neurologic: Alert and oriented X 3, normal strength and tone. Normal coordination and gait.  Disposition: 06-Home-Health Care Svc   Allergies as of 07/12/2016   No Known Allergies     Medication List    TAKE these medications   aspirin 81 MG EC tablet Take 1 tablet (81 mg total) by mouth daily.   atorvastatin 40 MG tablet Commonly known as:  LIPITOR Take 1 tablet (40 mg total) by mouth daily at 6 PM.   CoQ10 100 MG Caps Take 100 mg by mouth daily.   diltiazem 240 MG 24 hr capsule Commonly known as:  CARDIZEM CD Take 240 mg by mouth daily.   Fish Oil 1000 MG Caps Take 1,000 mg by mouth daily.   furosemide 20 MG tablet Commonly known as:  LASIX Take 20 mg by mouth daily as needed. For fluid retention.   Garlic 2800 MG Caps Take 1,000 mg by mouth daily.   GLUCOSAMINE CHONDR COMPLEX PO Take 1 capsule by mouth daily.   guaiFENesin 600 MG 12 hr tablet Commonly known as:  MUCINEX Take 600 mg by mouth 2 (two) times daily.   hydrALAZINE 25 MG tablet Commonly known as:  APRESOLINE Take 25 mg by mouth 2 (two) times daily with a meal.   metoprolol tartrate 25 MG tablet Commonly known as:  LOPRESSOR Take 0.5 tablets (12.5 mg total) by mouth 2 (two) times daily.  nitroGLYCERIN 0.4 MG SL tablet Commonly known as:  NITROSTAT Place 1 tablet (0.4 mg total) under the tongue every 5 (five) minutes x 3 doses as needed for chest pain.   oxyCODONE-acetaminophen 5-325 MG tablet Commonly known as:  ROXICET Take 1-2 tablets by mouth every 8 (eight)  hours as needed for severe pain.   tamsulosin 0.4 MG Caps capsule Commonly known as:  FLOMAX Take 0.4 mg by mouth every morning.   tiZANidine 4 MG tablet Commonly known as:  ZANAFLEX TAKE 1 TABLET (4 MG TOTAL) BY MOUTH EVERY 8 (EIGHT) HOURS AS NEEDED FOR MUSCLE SPASMS.   Turmeric 500 MG Tabs Take 500 mg by mouth daily.   vitamin B-12 1000 MCG tablet Commonly known as:  CYANOCOBALAMIN Take 1,000 mcg by mouth daily.   vitamin C 1000 MG tablet Take 1,000 mg by mouth daily.   Vitamin D-3 1000 units Caps Take 1,000 Units by mouth daily.      Follow-up Information    Josetta Huddle, MD. Schedule an appointment as soon as possible for a visit in 1 month(s).   Specialty:  Internal Medicine Contact information: [address] [place] [address] [phone]        Dixie Dials, MD. Schedule an appointment as soon as possible for a visit in 1 week(s).   Specialty:  Cardiology Contact information: [name] [address] [phone]           Signed: Birdie Riddle 07/12/2016, 9:40 AM

## 2016-07-12 NOTE — Progress Notes (Signed)
The patient and his wife have been given discharge instructions along with a medication list and what to take today. The patient has follow up appointments and prescriptions to pick up. He has education on Heart Cath site. Discharging with wife via car.   Saddie Benders RN

## 2016-07-15 LAB — RNA QUALITATIVE: HIV 1 RNA Qualitative: 1

## 2016-07-15 LAB — HIV 1/2 AB DIFFERENTIATION
HIV 1 Ab: NEGATIVE
HIV 2 AB: NEGATIVE
Note: NEGATIVE

## 2016-07-15 LAB — HIV ANTIBODY (ROUTINE TESTING W REFLEX): HIV Screen 4th Generation wRfx: REACTIVE — AB

## 2016-07-19 ENCOUNTER — Other Ambulatory Visit (INDEPENDENT_AMBULATORY_CARE_PROVIDER_SITE_OTHER): Payer: Self-pay | Admitting: Physician Assistant

## 2016-07-22 DIAGNOSIS — I2510 Atherosclerotic heart disease of native coronary artery without angina pectoris: Secondary | ICD-10-CM | POA: Diagnosis not present

## 2016-07-22 DIAGNOSIS — I10 Essential (primary) hypertension: Secondary | ICD-10-CM | POA: Diagnosis not present

## 2016-07-22 DIAGNOSIS — R072 Precordial pain: Secondary | ICD-10-CM | POA: Diagnosis not present

## 2016-07-24 DIAGNOSIS — R972 Elevated prostate specific antigen [PSA]: Secondary | ICD-10-CM | POA: Diagnosis not present

## 2016-07-24 DIAGNOSIS — N19 Unspecified kidney failure: Secondary | ICD-10-CM | POA: Diagnosis not present

## 2016-07-24 DIAGNOSIS — I10 Essential (primary) hypertension: Secondary | ICD-10-CM | POA: Diagnosis not present

## 2016-07-24 DIAGNOSIS — R5383 Other fatigue: Secondary | ICD-10-CM | POA: Diagnosis not present

## 2016-07-24 DIAGNOSIS — R609 Edema, unspecified: Secondary | ICD-10-CM | POA: Diagnosis not present

## 2016-08-29 ENCOUNTER — Other Ambulatory Visit (INDEPENDENT_AMBULATORY_CARE_PROVIDER_SITE_OTHER): Payer: Self-pay | Admitting: Physician Assistant

## 2016-08-29 NOTE — Telephone Encounter (Signed)
Please advise 

## 2016-09-16 ENCOUNTER — Other Ambulatory Visit (INDEPENDENT_AMBULATORY_CARE_PROVIDER_SITE_OTHER): Payer: Self-pay | Admitting: Orthopaedic Surgery

## 2016-09-16 NOTE — Telephone Encounter (Signed)
Please advise 

## 2016-10-02 DIAGNOSIS — R972 Elevated prostate specific antigen [PSA]: Secondary | ICD-10-CM | POA: Diagnosis not present

## 2016-10-02 DIAGNOSIS — N1330 Unspecified hydronephrosis: Secondary | ICD-10-CM | POA: Diagnosis not present

## 2016-10-15 DIAGNOSIS — R609 Edema, unspecified: Secondary | ICD-10-CM | POA: Diagnosis not present

## 2016-10-15 DIAGNOSIS — R5383 Other fatigue: Secondary | ICD-10-CM | POA: Diagnosis not present

## 2016-10-15 DIAGNOSIS — I10 Essential (primary) hypertension: Secondary | ICD-10-CM | POA: Diagnosis not present

## 2016-10-15 DIAGNOSIS — N19 Unspecified kidney failure: Secondary | ICD-10-CM | POA: Diagnosis not present

## 2016-11-04 ENCOUNTER — Ambulatory Visit (INDEPENDENT_AMBULATORY_CARE_PROVIDER_SITE_OTHER): Payer: BLUE CROSS/BLUE SHIELD | Admitting: Orthopaedic Surgery

## 2016-11-04 DIAGNOSIS — M1712 Unilateral primary osteoarthritis, left knee: Secondary | ICD-10-CM

## 2016-11-04 DIAGNOSIS — M5441 Lumbago with sciatica, right side: Secondary | ICD-10-CM | POA: Diagnosis not present

## 2016-11-04 DIAGNOSIS — M25562 Pain in left knee: Secondary | ICD-10-CM

## 2016-11-04 DIAGNOSIS — G8929 Other chronic pain: Secondary | ICD-10-CM

## 2016-11-04 MED ORDER — METHYLPREDNISOLONE 4 MG PO TABS: ORAL_TABLET | ORAL | 0 refills | Status: DC

## 2016-11-04 MED ORDER — TRAMADOL HCL 50 MG PO TABS: 100.0000 mg | ORAL_TABLET | Freq: Three times a day (TID) | ORAL | 0 refills | Status: DC | PRN

## 2016-11-04 NOTE — Progress Notes (Signed)
The patient is well-known to me. He is almost a year out from a right total knee arthroplasty. He has severe arthritis and degenerative joint disease of his left knee and this is been well documented with clinical exam and x-rays. He is tried and failed all forms conservative treatment on that left knee. We have also followed him for a long period time for his lumbar spine pain. He's been having some radicular symptoms into his right and left buttock cheek areas. He has tried an inversion table. He's tried 3 months of formal physical therapy area is tried activity modification as well as anti-inflammatories and weight loss. He still having the same pain and at this point I feel an MRI is warranted to assess the facet joints of his back and assess for stenosis or other nerve root impingement. He's had normal-appearing x-rays of his lumbar spine.  On examination of his back he has significant stiffness with flexion extension with pain with hyperextension. It does radiate in the sciatic region on both sides. He has good strength in his bilateral lower. His right total knee arthroplasty does click a little bit but otherwise is stable. He is improving his quad strength. His left knee has varus malalignment with significant patellofemoral crepitation and global tenderness.  At this point we will set him up for a left total knee arthroplasty in early December of this year. That along with her discussion of the risk and benefits of this. We also need to set him up for an MRI of his lumbar spine to assess for nerve impingement. We'll see him back in 2 weeks ago over the MRI of his lumbar spine.

## 2016-11-05 ENCOUNTER — Other Ambulatory Visit (INDEPENDENT_AMBULATORY_CARE_PROVIDER_SITE_OTHER): Payer: Self-pay

## 2016-11-05 DIAGNOSIS — M4807 Spinal stenosis, lumbosacral region: Secondary | ICD-10-CM

## 2016-11-18 ENCOUNTER — Ambulatory Visit (INDEPENDENT_AMBULATORY_CARE_PROVIDER_SITE_OTHER): Payer: BLUE CROSS/BLUE SHIELD | Admitting: Orthopaedic Surgery

## 2016-11-19 ENCOUNTER — Telehealth (INDEPENDENT_AMBULATORY_CARE_PROVIDER_SITE_OTHER): Payer: Self-pay | Admitting: *Deleted

## 2016-11-19 NOTE — Telephone Encounter (Signed)
-----   Message from Anise Salvo, [place] sent at 11/19/2016  8:57 AM EDT ----- Regarding: MRI Patient called and states he called [facility] and they don't have anything until the 25th, I rescheduled his appt with Ninfa Linden for the 22nd. Can we get him in somewhere else?? (This is the one I was asking you about yesterday)

## 2016-11-19 NOTE — Telephone Encounter (Signed)
Pt is scheduled at [facility] on [address] on Thursday Oct 18th at 7pm, pt is aware needs to arrive 15 mins early to register.

## 2016-11-19 NOTE — Telephone Encounter (Signed)
IC pt and he stated he is wanting something after 530pm so that he isnt missing time from work, I suggested either [facility] or the hospital. Pt is agreeable to who ever will see him this week.

## 2016-11-21 ENCOUNTER — Ambulatory Visit (HOSPITAL_COMMUNITY)
Admission: RE | Admit: 2016-11-21 | Discharge: 2016-11-21 | Disposition: A | Discharge status: Home / Self Care | Payer: BLUE CROSS/BLUE SHIELD | Source: Ambulatory Visit | Attending: Orthopaedic Surgery | Admitting: Orthopaedic Surgery

## 2016-11-21 DIAGNOSIS — M5136 Other intervertebral disc degeneration, lumbar region: Secondary | ICD-10-CM | POA: Diagnosis not present

## 2016-11-21 DIAGNOSIS — M545 Low back pain: Secondary | ICD-10-CM | POA: Diagnosis not present

## 2016-11-21 DIAGNOSIS — M4807 Spinal stenosis, lumbosacral region: Secondary | ICD-10-CM

## 2016-11-25 ENCOUNTER — Encounter (INDEPENDENT_AMBULATORY_CARE_PROVIDER_SITE_OTHER): Payer: Self-pay | Admitting: Orthopaedic Surgery

## 2016-11-25 ENCOUNTER — Ambulatory Visit (INDEPENDENT_AMBULATORY_CARE_PROVIDER_SITE_OTHER): Payer: BLUE CROSS/BLUE SHIELD | Admitting: Orthopaedic Surgery

## 2016-11-25 DIAGNOSIS — M1712 Unilateral primary osteoarthritis, left knee: Secondary | ICD-10-CM | POA: Diagnosis not present

## 2016-11-25 DIAGNOSIS — G8929 Other chronic pain: Secondary | ICD-10-CM | POA: Diagnosis not present

## 2016-11-25 DIAGNOSIS — M5441 Lumbago with sciatica, right side: Secondary | ICD-10-CM | POA: Diagnosis not present

## 2016-11-25 NOTE — Progress Notes (Signed)
The patient is following up after an MRI of his lumbar spine.  He is having severe low back pain with radicular symptoms.  He is someone that we have performed a right total knee arthroplasty on the x-ray severe arthritis in his left knee and we have him scheduled for that surgery for total knee replacement December 7.  He has significant amount of truncal obesity.  This is creating pain in his lumbar spine with flexion and extension.  MRI of his back actually shows minimal arthritic changes.  There is no central or foraminal stenosis at all and only minimal disc bulge at one level.  The facet joints appear normal as well.  I do feel that his back pain is related to his posture with dealing with his knee replaced on the right side and his severe arthritis in the left side.  Also his obesity and lack of core strength greatly weakening of the facet joints of his back.  He understands that weight loss is going to help him the most in terms of his back and I do feel that being more balanced with his knees and having stronger knees and core strength will help.  We will see him back 2 weeks after his upcoming left total knee arthroplasty.

## 2016-11-28 ENCOUNTER — Other Ambulatory Visit: Payer: BLUE CROSS/BLUE SHIELD

## 2016-12-25 ENCOUNTER — Other Ambulatory Visit (INDEPENDENT_AMBULATORY_CARE_PROVIDER_SITE_OTHER): Payer: Self-pay | Admitting: Physician Assistant

## 2016-12-25 ENCOUNTER — Ambulatory Visit (INDEPENDENT_AMBULATORY_CARE_PROVIDER_SITE_OTHER): Payer: BLUE CROSS/BLUE SHIELD | Admitting: Orthopaedic Surgery

## 2016-12-31 ENCOUNTER — Other Ambulatory Visit (INDEPENDENT_AMBULATORY_CARE_PROVIDER_SITE_OTHER): Payer: Self-pay

## 2017-01-03 NOTE — Patient Instructions (Addendum)
ELLIAS MCELREATH  01/03/2017   Your procedure is scheduled on: 01-10-17   Report to [facility] Main  Entrance Take [place] Elevators to 3rd floor to  [place] at 10:15 AM.   Call this number if you have problems the morning of surgery [phone]    Remember: ONLY 1 PERSON MAY GO WITH YOU TO SHORT STAY TO GET  READY MORNING OF [place].  Do not eat food or drink liquids :After Midnight.     Take these medicines the morning of surgery with A SIP OF WATER: Diltiazem (Cardizem), Metoprolol Tartrate (Lopressor) and Hydralazine (Apresoline)                                You may not have any metal on your body including hair pins and              piercings  Do not wear jewelry, lotions, powders or deodorant             Men may shave face and neck.   Do not bring valuables to the hospital. [place].  Contacts, dentures or bridgework may not be worn into surgery.  Leave suitcase in the car. After surgery it may be brought to your room.                Please read over the following fact sheets you were given: _____________________________________________________________________             [facility] - Preparing for Surgery Before surgery, you can play an important role.  Because skin is not sterile, your skin needs to be as free of germs as possible.  You can reduce the number of germs on your skin by washing with CHG (chlorahexidine gluconate) soap before surgery.  CHG is an antiseptic cleaner which kills germs and bonds with the skin to continue killing germs even after washing. Please DO NOT use if you have an allergy to CHG or antibacterial soaps.  If your skin becomes reddened/irritated stop using the CHG and inform your nurse when you arrive at Short Stay. Do not shave (including legs and underarms) for at least 48 hours prior to the first CHG shower.  You may shave your face/neck. Please follow  these instructions carefully:  1.  Shower with CHG Soap the night before surgery and the  morning of Surgery.  2.  If you choose to wash your hair, wash your hair first as usual with your  normal  shampoo.  3.  After you shampoo, rinse your hair and body thoroughly to remove the  shampoo.                           4.  Use CHG as you would any other liquid soap.  You can apply chg directly  to the skin and wash                       Gently with a scrungie or clean washcloth.  5.  Apply the CHG Soap to your body ONLY FROM THE NECK DOWN.   Do not use on face/ open  Wound or open sores. Avoid contact with eyes, ears mouth and genitals (private parts).                       Wash face,  Genitals (private parts) with your normal soap.             6.  Wash thoroughly, paying special attention to the area where your surgery  will be performed.  7.  Thoroughly rinse your body with warm water from the neck down.  8.  DO NOT shower/wash with your normal soap after using and rinsing off  the CHG Soap.                9.  Pat yourself dry with a clean towel.            10.  Wear clean pajamas.            11.  Place clean sheets on your bed the night of your first shower and do not  sleep with pets. Day of Surgery : Do not apply any lotions/deodorants the morning of surgery.  Please wear clean clothes to the hospital/surgery center.  FAILURE TO FOLLOW THESE INSTRUCTIONS MAY RESULT IN THE CANCELLATION OF YOUR SURGERY PATIENT SIGNATURE_________________________________  NURSE SIGNATURE__________________________________  ________________________________________________________________________   Adam Phenix  An incentive spirometer is a tool that can help keep your lungs clear and active. This tool measures how well you are filling your lungs with each breath. Taking long deep breaths may help reverse or decrease the chance of developing breathing (pulmonary) problems  (especially infection) following:  A long period of time when you are unable to move or be active. BEFORE THE PROCEDURE   If the spirometer includes an indicator to show your best effort, your nurse or respiratory therapist will set it to a desired goal.  If possible, sit up straight or lean slightly forward. Try not to slouch.  Hold the incentive spirometer in an upright position. INSTRUCTIONS FOR USE  1. Sit on the edge of your bed if possible, or sit up as far as you can in bed or on a chair. 2. Hold the incentive spirometer in an upright position. 3. Breathe out normally. 4. Place the mouthpiece in your mouth and seal your lips tightly around it. 5. Breathe in slowly and as deeply as possible, raising the piston or the ball toward the top of the column. 6. Hold your breath for 3-5 seconds or for as long as possible. Allow the piston or ball to fall to the bottom of the column. 7. Remove the mouthpiece from your mouth and breathe out normally. 8. Rest for a few seconds and repeat Steps 1 through 7 at least 10 times every 1-2 hours when you are awake. Take your time and take a few normal breaths between deep breaths. 9. The spirometer may include an indicator to show your best effort. Use the indicator as a goal to work toward during each repetition. 10. After each set of 10 deep breaths, practice coughing to be sure your lungs are clear. If you have an incision (the cut made at the time of surgery), support your incision when coughing by placing a pillow or rolled up towels firmly against it. Once you are able to get out of bed, walk around indoors and cough well. You may stop using the incentive spirometer when instructed by your caregiver.  RISKS AND COMPLICATIONS  Take your time so you do not get  dizzy or light-headed.  If you are in pain, you may need to take or ask for pain medication before doing incentive spirometry. It is harder to take a deep breath if you are having  pain. AFTER USE  Rest and breathe slowly and easily.  It can be helpful to keep track of a log of your progress. Your caregiver can provide you with a simple table to help with this. If you are using the spirometer at home, follow these instructions: [place] IF:   You are having difficultly using the spirometer.  You have trouble using the spirometer as often as instructed.  Your pain medication is not giving enough relief while using the spirometer.  You develop fever of 100.5 F (38.1 C) or higher. SEEK IMMEDIATE MEDICAL CARE IF:   You cough up bloody sputum that had not been present before.  You develop fever of 102 F (38.9 C) or greater.  You develop worsening pain at or near the incision site. MAKE SURE YOU:   Understand these instructions.  Will watch your condition.  Will get help right away if you are not doing well or get worse. Document Released: 06/03/2006 Document Revised: 04/15/2011 Document Reviewed: 08/04/2006 [facility] Patient Information 2014 [place], [place].   ________________________________________________________________________

## 2017-01-06 ENCOUNTER — Encounter (HOSPITAL_COMMUNITY): Payer: Self-pay

## 2017-01-06 ENCOUNTER — Other Ambulatory Visit: Payer: Self-pay

## 2017-01-06 ENCOUNTER — Encounter (HOSPITAL_COMMUNITY)
Admission: RE | Admit: 2017-01-06 | Discharge: 2017-01-06 | Disposition: A | Discharge status: Home / Self Care | Payer: BLUE CROSS/BLUE SHIELD | Source: Ambulatory Visit | Attending: Orthopaedic Surgery | Admitting: Orthopaedic Surgery

## 2017-01-06 DIAGNOSIS — Z01812 Encounter for preprocedural laboratory examination: Secondary | ICD-10-CM | POA: Insufficient documentation

## 2017-01-06 DIAGNOSIS — M1712 Unilateral primary osteoarthritis, left knee: Secondary | ICD-10-CM | POA: Diagnosis not present

## 2017-01-06 LAB — BASIC METABOLIC PANEL
ANION GAP: 8 (ref 5–15)
BUN: 25 mg/dL — AB (ref 6–20)
CO2: 27 mmol/L (ref 22–32)
Calcium: 9.2 mg/dL (ref 8.9–10.3)
Chloride: 103 mmol/L (ref 101–111)
Creatinine, Ser: 2.04 mg/dL — ABNORMAL HIGH (ref 0.61–1.24)
GFR calc Af Amer: 39 mL/min — ABNORMAL LOW (ref >60)
GFR, EST NON AFRICAN AMERICAN: 33 mL/min — AB (ref >60)
Glucose, Bld: 102 mg/dL — ABNORMAL HIGH (ref 65–99)
POTASSIUM: 3.9 mmol/L (ref 3.5–5.1)
SODIUM: 138 mmol/L (ref 135–145)

## 2017-01-06 LAB — SURGICAL PCR SCREEN
MRSA, PCR: NEGATIVE
STAPHYLOCOCCUS AUREUS: NEGATIVE

## 2017-01-06 LAB — CBC
HEMATOCRIT: 42.2 % (ref 39.0–52.0)
HEMOGLOBIN: 13.9 g/dL (ref 13.0–17.0)
MCH: 29.4 pg (ref 26.0–34.0)
MCHC: 32.9 g/dL (ref 30.0–36.0)
MCV: 89.2 fL (ref 78.0–100.0)
Platelets: 218 10*3/uL (ref 150–400)
RBC: 4.73 MIL/uL (ref 4.22–5.81)
RDW: 14.1 % (ref 11.5–15.5)
WBC: 8.3 10*3/uL (ref 4.0–10.5)

## 2017-01-06 NOTE — Progress Notes (Signed)
01-06-17 BMP result routed to Dr. Ninfa Linden for review

## 2017-01-06 NOTE — Progress Notes (Signed)
Spoke to Dr. Jillyn Hidden regarding pt's 07-10-16 ECHO and Coronary Angiography report. Per Dr. Jillyn Hidden, no cardiac clearance needed at this time.

## 2017-01-06 NOTE — Progress Notes (Signed)
   01/06/17 1433  OBSTRUCTIVE SLEEP APNEA  Have you ever been diagnosed with sleep apnea through a sleep study? No  Do you snore loudly (loud enough to be heard through closed doors)?  1  Do you often feel tired, fatigued, or sleepy during the daytime (such as falling asleep during driving or talking to someone)? 1  Has anyone observed you stop breathing during your sleep? 0  Do you have, or are you being treated for high blood pressure? 1  BMI more than 35 kg/m2? 1  Age > 50 (1-yes) 1  Neck circumference greater than:Male 16 inches or larger, Male 17inches or larger? 1  Male Gender (Yes=1) 1  Obstructive Sleep Apnea Score 7

## 2017-01-09 MED ORDER — DEXTROSE 5 % IV SOLN
3.0000 g | INTRAVENOUS | Status: AC
  Administered 2017-01-10: 3 g via INTRAVENOUS
  Filled 2017-01-09: qty 3

## 2017-01-09 NOTE — Progress Notes (Addendum)
Attempted to contact patient at [phone] to advise that the surgery time had changed. Voice message for this number  indicated that voicemail was full and could not received any additional messages. Also attempted to contact patient at secondary number of  [phone]. No response.   Contacted pt's wife at [phone], and advised that surgery time had changed. Provided wife with new surgery and arrival time. Pt's wife stated that the [phone] was pt's work cell phone. She will get in contact with her husband, and provide new surgery time. Advised wife to have husband call me so that I can verify that message was received. Wife verbalized understanding.    Spoke to pt. Pt made aware that surgery on 01-10-17 is now scheduled for 9:15 AM. Pt to arrive at 6:45 AM,and to remain NPO after Midnight. Pt verbalized understanding.

## 2017-01-10 ENCOUNTER — Ambulatory Visit (HOSPITAL_COMMUNITY)
Admission: RE | Admit: 2017-01-10 | Discharge: 2017-01-11 | Disposition: A | Discharge status: Home / Self Care | Payer: BLUE CROSS/BLUE SHIELD | Source: Ambulatory Visit | Attending: Orthopaedic Surgery | Admitting: Orthopaedic Surgery

## 2017-01-10 ENCOUNTER — Inpatient Hospital Stay (HOSPITAL_COMMUNITY): Payer: BLUE CROSS/BLUE SHIELD | Admitting: Anesthesiology

## 2017-01-10 ENCOUNTER — Encounter (HOSPITAL_COMMUNITY): Payer: Self-pay | Admitting: Emergency Medicine

## 2017-01-10 ENCOUNTER — Inpatient Hospital Stay (HOSPITAL_COMMUNITY): Payer: BLUE CROSS/BLUE SHIELD

## 2017-01-10 ENCOUNTER — Encounter (HOSPITAL_COMMUNITY)
Admission: RE | Disposition: A | Discharge status: Home / Self Care | Payer: Self-pay | Source: Ambulatory Visit | Attending: Orthopaedic Surgery

## 2017-01-10 ENCOUNTER — Other Ambulatory Visit: Payer: Self-pay

## 2017-01-10 DIAGNOSIS — M1712 Unilateral primary osteoarthritis, left knee: Secondary | ICD-10-CM | POA: Diagnosis not present

## 2017-01-10 DIAGNOSIS — N400 Enlarged prostate without lower urinary tract symptoms: Secondary | ICD-10-CM | POA: Diagnosis not present

## 2017-01-10 DIAGNOSIS — Z6841 Body Mass Index (BMI) 40.0 and over, adult: Secondary | ICD-10-CM | POA: Diagnosis not present

## 2017-01-10 DIAGNOSIS — Z87891 Personal history of nicotine dependence: Secondary | ICD-10-CM | POA: Insufficient documentation

## 2017-01-10 DIAGNOSIS — M5441 Lumbago with sciatica, right side: Secondary | ICD-10-CM | POA: Insufficient documentation

## 2017-01-10 DIAGNOSIS — M170 Bilateral primary osteoarthritis of knee: Secondary | ICD-10-CM | POA: Diagnosis not present

## 2017-01-10 DIAGNOSIS — M1711 Unilateral primary osteoarthritis, right knee: Secondary | ICD-10-CM

## 2017-01-10 DIAGNOSIS — Z86010 Personal history of colonic polyps: Secondary | ICD-10-CM | POA: Diagnosis not present

## 2017-01-10 DIAGNOSIS — Z471 Aftercare following joint replacement surgery: Secondary | ICD-10-CM | POA: Diagnosis not present

## 2017-01-10 DIAGNOSIS — E669 Obesity, unspecified: Secondary | ICD-10-CM | POA: Insufficient documentation

## 2017-01-10 DIAGNOSIS — Z888 Allergy status to other drugs, medicaments and biological substances status: Secondary | ICD-10-CM | POA: Diagnosis not present

## 2017-01-10 DIAGNOSIS — I10 Essential (primary) hypertension: Secondary | ICD-10-CM | POA: Insufficient documentation

## 2017-01-10 DIAGNOSIS — Z96652 Presence of left artificial knee joint: Secondary | ICD-10-CM

## 2017-01-10 DIAGNOSIS — G8929 Other chronic pain: Secondary | ICD-10-CM | POA: Diagnosis not present

## 2017-01-10 DIAGNOSIS — Z96651 Presence of right artificial knee joint: Secondary | ICD-10-CM | POA: Diagnosis not present

## 2017-01-10 DIAGNOSIS — G8918 Other acute postprocedural pain: Secondary | ICD-10-CM | POA: Diagnosis not present

## 2017-01-10 DIAGNOSIS — K219 Gastro-esophageal reflux disease without esophagitis: Secondary | ICD-10-CM | POA: Diagnosis not present

## 2017-01-10 DIAGNOSIS — Z87898 Personal history of other specified conditions: Secondary | ICD-10-CM | POA: Diagnosis not present

## 2017-01-10 HISTORY — PX: TOTAL KNEE ARTHROPLASTY: SHX125

## 2017-01-10 SURGERY — ARTHROPLASTY, KNEE, TOTAL
Anesthesia: Spinal | Site: Knee | Laterality: Left

## 2017-01-10 MED ORDER — VITAMIN C 500 MG PO TABS
500.0000 mg | ORAL_TABLET | Freq: Every day | ORAL | Status: DC
  Administered 2017-01-10 – 2017-01-11 (×2): 500 mg via ORAL
  Filled 2017-01-10 (×2): qty 1

## 2017-01-10 MED ORDER — OXYCODONE HCL 5 MG PO TABS
10.0000 mg | ORAL_TABLET | ORAL | Status: DC | PRN
  Administered 2017-01-10: 10 mg via ORAL
  Administered 2017-01-11: 15 mg via ORAL
  Administered 2017-01-11: 10 mg via ORAL
  Filled 2017-01-10 (×2): qty 2
  Filled 2017-01-10 (×2): qty 3

## 2017-01-10 MED ORDER — METOCLOPRAMIDE HCL 5 MG PO TABS: 5.0000 mg | ORAL_TABLET | Freq: Three times a day (TID) | ORAL | Status: DC | PRN

## 2017-01-10 MED ORDER — FENTANYL CITRATE (PF) 100 MCG/2ML IJ SOLN
100.0000 ug | Freq: Once | INTRAMUSCULAR | Status: AC
  Administered 2017-01-10: 100 ug via INTRAVENOUS

## 2017-01-10 MED ORDER — ROPIVACAINE HCL 5 MG/ML IJ SOLN
INTRAMUSCULAR | Status: DC | PRN
  Administered 2017-01-10: 30 mL via PERINEURAL

## 2017-01-10 MED ORDER — FUROSEMIDE 20 MG PO TABS: 20.0000 mg | ORAL_TABLET | ORAL | Status: DC

## 2017-01-10 MED ORDER — KETOROLAC TROMETHAMINE 15 MG/ML IJ SOLN
7.5000 mg | Freq: Four times a day (QID) | INTRAMUSCULAR | Status: DC
  Administered 2017-01-10 – 2017-01-11 (×3): 7.5 mg via INTRAVENOUS
  Filled 2017-01-10 (×3): qty 1

## 2017-01-10 MED ORDER — HYDRALAZINE HCL 25 MG PO TABS
25.0000 mg | ORAL_TABLET | Freq: Two times a day (BID) | ORAL | Status: DC
  Administered 2017-01-10 – 2017-01-11 (×2): 25 mg via ORAL
  Filled 2017-01-10 (×2): qty 1

## 2017-01-10 MED ORDER — VITAMIN B-12 1000 MCG PO TABS
1000.0000 ug | ORAL_TABLET | Freq: Every day | ORAL | Status: DC
  Administered 2017-01-10 – 2017-01-11 (×2): 1000 ug via ORAL
  Filled 2017-01-10 (×2): qty 1

## 2017-01-10 MED ORDER — HYDROCODONE-ACETAMINOPHEN 5-325 MG PO TABS
1.0000 | ORAL_TABLET | ORAL | Status: DC | PRN
  Administered 2017-01-11: 2 via ORAL
  Filled 2017-01-10: qty 2

## 2017-01-10 MED ORDER — PROPOFOL 500 MG/50ML IV EMUL
INTRAVENOUS | Status: DC | PRN
  Administered 2017-01-10: 50 ug/kg/min via INTRAVENOUS

## 2017-01-10 MED ORDER — DILTIAZEM HCL ER COATED BEADS 240 MG PO CP24
240.0000 mg | ORAL_CAPSULE | Freq: Every day | ORAL | Status: DC
  Administered 2017-01-11: 240 mg via ORAL
  Filled 2017-01-10: qty 1

## 2017-01-10 MED ORDER — FUROSEMIDE 40 MG PO TABS
40.0000 mg | ORAL_TABLET | ORAL | Status: DC
  Administered 2017-01-11: 40 mg via ORAL
  Filled 2017-01-10: qty 1

## 2017-01-10 MED ORDER — ACETAMINOPHEN 325 MG PO TABS: 650.0000 mg | ORAL_TABLET | ORAL | Status: DC | PRN

## 2017-01-10 MED ORDER — SODIUM CHLORIDE 0.9 % IV SOLN
INTRAVENOUS | Status: DC
  Administered 2017-01-10: 75 mL/h via INTRAVENOUS

## 2017-01-10 MED ORDER — METOCLOPRAMIDE HCL 5 MG/ML IJ SOLN: 5.0000 mg | Freq: Three times a day (TID) | INTRAMUSCULAR | Status: DC | PRN

## 2017-01-10 MED ORDER — LACTATED RINGERS IV SOLN
INTRAVENOUS | Status: DC
  Administered 2017-01-10 (×3): via INTRAVENOUS

## 2017-01-10 MED ORDER — HYDROMORPHONE HCL 1 MG/ML IJ SOLN
1.0000 mg | INTRAMUSCULAR | Status: DC | PRN
  Administered 2017-01-10: 1 mg via INTRAVENOUS
  Filled 2017-01-10: qty 1

## 2017-01-10 MED ORDER — ZOLPIDEM TARTRATE 5 MG PO TABS: 5.0000 mg | ORAL_TABLET | Freq: Every evening | ORAL | Status: DC | PRN

## 2017-01-10 MED ORDER — FUROSEMIDE 20 MG PO TABS
20.0000 mg | ORAL_TABLET | ORAL | Status: DC
  Filled 2017-01-10: qty 1

## 2017-01-10 MED ORDER — PROPOFOL 10 MG/ML IV BOLUS
INTRAVENOUS | Status: AC
  Filled 2017-01-10: qty 20

## 2017-01-10 MED ORDER — TRANEXAMIC ACID 1000 MG/10ML IV SOLN
1000.0000 mg | INTRAVENOUS | Status: AC
  Administered 2017-01-10: 1000 mg via INTRAVENOUS
  Filled 2017-01-10: qty 1100

## 2017-01-10 MED ORDER — ASPIRIN EC 325 MG PO TBEC
325.0000 mg | DELAYED_RELEASE_TABLET | Freq: Two times a day (BID) | ORAL | Status: DC
  Administered 2017-01-10 – 2017-01-11 (×2): 325 mg via ORAL
  Filled 2017-01-10 (×2): qty 1

## 2017-01-10 MED ORDER — MIDAZOLAM HCL 2 MG/2ML IJ SOLN
INTRAMUSCULAR | Status: AC
  Filled 2017-01-10: qty 2

## 2017-01-10 MED ORDER — TIZANIDINE HCL 4 MG PO TABS: 4.0000 mg | ORAL_TABLET | Freq: Three times a day (TID) | ORAL | Status: DC | PRN

## 2017-01-10 MED ORDER — MIDAZOLAM HCL 2 MG/2ML IJ SOLN
INTRAMUSCULAR | Status: AC
  Administered 2017-01-10: 2 mg via INTRAVENOUS
  Filled 2017-01-10: qty 2

## 2017-01-10 MED ORDER — PHENOL 1.4 % MT LIQD: 1.0000 | OROMUCOSAL | Status: DC | PRN

## 2017-01-10 MED ORDER — METOCLOPRAMIDE HCL 5 MG/ML IJ SOLN: 10.0000 mg | Freq: Once | INTRAMUSCULAR | Status: DC | PRN

## 2017-01-10 MED ORDER — METOPROLOL TARTRATE 12.5 MG HALF TABLET
12.5000 mg | ORAL_TABLET | Freq: Two times a day (BID) | ORAL | Status: DC
  Administered 2017-01-10 – 2017-01-11 (×2): 12.5 mg via ORAL
  Filled 2017-01-10 (×2): qty 1

## 2017-01-10 MED ORDER — ACETAMINOPHEN 650 MG RE SUPP: 650.0000 mg | RECTAL | Status: DC | PRN

## 2017-01-10 MED ORDER — ONDANSETRON HCL 4 MG/2ML IJ SOLN
INTRAMUSCULAR | Status: DC | PRN
  Administered 2017-01-10: 4 mg via INTRAVENOUS

## 2017-01-10 MED ORDER — SODIUM CHLORIDE 0.9 % IR SOLN
Status: DC | PRN
  Administered 2017-01-10: 1000 mL

## 2017-01-10 MED ORDER — ONDANSETRON HCL 4 MG/2ML IJ SOLN
INTRAMUSCULAR | Status: AC
  Filled 2017-01-10: qty 2

## 2017-01-10 MED ORDER — MIDAZOLAM HCL 2 MG/2ML IJ SOLN
2.0000 mg | Freq: Once | INTRAMUSCULAR | Status: AC
  Administered 2017-01-10: 2 mg via INTRAVENOUS

## 2017-01-10 MED ORDER — CHLORHEXIDINE GLUCONATE 4 % EX LIQD: 60.0000 mL | Freq: Once | CUTANEOUS | Status: DC

## 2017-01-10 MED ORDER — FENTANYL CITRATE (PF) 100 MCG/2ML IJ SOLN: 25.0000 ug | INTRAMUSCULAR | Status: DC | PRN

## 2017-01-10 MED ORDER — DEXAMETHASONE SODIUM PHOSPHATE 10 MG/ML IJ SOLN
INTRAMUSCULAR | Status: DC | PRN
  Administered 2017-01-10: 10 mg via INTRAVENOUS

## 2017-01-10 MED ORDER — MENTHOL 3 MG MT LOZG: 1.0000 | LOZENGE | OROMUCOSAL | Status: DC | PRN

## 2017-01-10 MED ORDER — ROSUVASTATIN CALCIUM 5 MG PO TABS
2.5000 mg | ORAL_TABLET | Freq: Every day | ORAL | Status: DC
  Administered 2017-01-10: 2.5 mg via ORAL
  Filled 2017-01-10: qty 1

## 2017-01-10 MED ORDER — HYDRALAZINE HCL 20 MG/ML IJ SOLN
INTRAMUSCULAR | Status: AC
  Filled 2017-01-10: qty 1

## 2017-01-10 MED ORDER — PROPOFOL 10 MG/ML IV BOLUS
INTRAVENOUS | Status: AC
  Filled 2017-01-10: qty 40

## 2017-01-10 MED ORDER — BUPIVACAINE IN DEXTROSE 0.75-8.25 % IT SOLN
INTRATHECAL | Status: DC | PRN
  Administered 2017-01-10: 2 mL via INTRATHECAL

## 2017-01-10 MED ORDER — ONDANSETRON HCL 4 MG PO TABS: 4.0000 mg | ORAL_TABLET | Freq: Four times a day (QID) | ORAL | Status: DC | PRN

## 2017-01-10 MED ORDER — LACTATED RINGERS IV SOLN: INTRAVENOUS | Status: DC

## 2017-01-10 MED ORDER — DIPHENHYDRAMINE HCL 12.5 MG/5ML PO ELIX: 12.5000 mg | ORAL_SOLUTION | ORAL | Status: DC | PRN

## 2017-01-10 MED ORDER — DEXAMETHASONE SODIUM PHOSPHATE 10 MG/ML IJ SOLN
INTRAMUSCULAR | Status: AC
  Filled 2017-01-10: qty 1

## 2017-01-10 MED ORDER — CEFAZOLIN SODIUM-DEXTROSE 2-4 GM/100ML-% IV SOLN
2.0000 g | Freq: Four times a day (QID) | INTRAVENOUS | Status: AC
  Administered 2017-01-10 (×2): 2 g via INTRAVENOUS
  Filled 2017-01-10 (×2): qty 100

## 2017-01-10 MED ORDER — HYDRALAZINE HCL 20 MG/ML IJ SOLN
5.0000 mg | Freq: Once | INTRAMUSCULAR | Status: AC
  Administered 2017-01-10: 5 mg via INTRAVENOUS

## 2017-01-10 MED ORDER — FENTANYL CITRATE (PF) 100 MCG/2ML IJ SOLN
INTRAMUSCULAR | Status: AC
  Administered 2017-01-10: 100 ug via INTRAVENOUS
  Filled 2017-01-10: qty 2

## 2017-01-10 MED ORDER — ONDANSETRON HCL 4 MG/2ML IJ SOLN: 4.0000 mg | Freq: Four times a day (QID) | INTRAMUSCULAR | Status: DC | PRN

## 2017-01-10 MED ORDER — DOCUSATE SODIUM 100 MG PO CAPS
100.0000 mg | ORAL_CAPSULE | Freq: Two times a day (BID) | ORAL | Status: DC
  Administered 2017-01-10 – 2017-01-11 (×2): 100 mg via ORAL
  Filled 2017-01-10 (×2): qty 1

## 2017-01-10 MED ORDER — TAMSULOSIN HCL 0.4 MG PO CAPS
0.4000 mg | ORAL_CAPSULE | Freq: Every morning | ORAL | Status: DC
  Administered 2017-01-11: 0.4 mg via ORAL
  Filled 2017-01-10: qty 1

## 2017-01-10 MED ORDER — MEPERIDINE HCL 50 MG/ML IJ SOLN: 6.2500 mg | INTRAMUSCULAR | Status: DC | PRN

## 2017-01-10 MED ORDER — ALUM & MAG HYDROXIDE-SIMETH 200-200-20 MG/5ML PO SUSP: 30.0000 mL | ORAL | Status: DC | PRN

## 2017-01-10 MED ORDER — SIMVASTATIN 20 MG PO TABS: 20.0000 mg | ORAL_TABLET | ORAL | Status: DC

## 2017-01-10 SURGICAL SUPPLY — 45 items
BAG ZIPLOCK 12X15 (MISCELLANEOUS) IMPLANT
BANDAGE ACE 6X5 VEL STRL LF (GAUZE/BANDAGES/DRESSINGS) ×2 IMPLANT
BEARIN INSERT TIBIAL SZ4 9 (Orthopedic Implant) IMPLANT
BEARING INSERT TIBIAL SZ4 9 (Orthopedic Implant) IMPLANT
BENZOIN TINCTURE PRP APPL 2/3 (GAUZE/BANDAGES/DRESSINGS) IMPLANT
BLADE SAG 18X100X1.27 (BLADE) ×2 IMPLANT
BOWL SMART MIX CTS (DISPOSABLE) ×2 IMPLANT
CAPT KNEE TRIATH TK-4 ×2 IMPLANT
COVER SURGICAL LIGHT HANDLE (MISCELLANEOUS) ×2 IMPLANT
CUFF TOURN SGL QUICK 34 (TOURNIQUET CUFF) ×1
CUFF TRNQT CYL 34X4X40X1 (TOURNIQUET CUFF) ×1 IMPLANT
DRAPE U-SHAPE 47X51 STRL (DRAPES) ×2 IMPLANT
DRSG AQUACEL AG ADV 3.5X10 (GAUZE/BANDAGES/DRESSINGS) ×2 IMPLANT
DRSG PAD ABDOMINAL 8X10 ST (GAUZE/BANDAGES/DRESSINGS) ×2 IMPLANT
DURAPREP 26ML APPLICATOR (WOUND CARE) ×2 IMPLANT
ELECT REM PT RETURN 15FT ADLT (MISCELLANEOUS) ×2 IMPLANT
GAUZE SPONGE 4X4 12PLY STRL (GAUZE/BANDAGES/DRESSINGS) ×2 IMPLANT
GAUZE XEROFORM 1X8 LF (GAUZE/BANDAGES/DRESSINGS) ×2 IMPLANT
GLOVE BIO SURGEON STRL SZ7.5 (GLOVE) ×14 IMPLANT
GLOVE BIOGEL PI IND STRL 8 (GLOVE) ×2 IMPLANT
GLOVE BIOGEL PI INDICATOR 8 (GLOVE) ×2
GLOVE ECLIPSE 8.0 STRL XLNG CF (GLOVE) ×2 IMPLANT
GOWN STRL REUS W/TWL XL LVL3 (GOWN DISPOSABLE) ×8 IMPLANT
HANDPIECE INTERPULSE COAX TIP (DISPOSABLE) ×1
IMMOBILIZER KNEE 20 (SOFTGOODS) ×2
IMMOBILIZER KNEE 20 THIGH 36 (SOFTGOODS) ×1 IMPLANT
NS IRRIG 1000ML POUR BTL (IV SOLUTION) ×2 IMPLANT
PACK TOTAL KNEE CUSTOM (KITS) ×2 IMPLANT
PADDING CAST COTTON 6X4 STRL (CAST SUPPLIES) ×4 IMPLANT
POSITIONER SURGICAL ARM (MISCELLANEOUS) ×2 IMPLANT
SET HNDPC FAN SPRY TIP SCT (DISPOSABLE) ×1 IMPLANT
SET PAD KNEE POSITIONER (MISCELLANEOUS) ×2 IMPLANT
STAPLER VISISTAT 35W (STAPLE) IMPLANT
STRIP CLOSURE SKIN 1/2X4 (GAUZE/BANDAGES/DRESSINGS) ×2 IMPLANT
SUT MNCRL AB 4-0 PS2 18 (SUTURE) IMPLANT
SUT VIC AB 0 CT1 27 (SUTURE) ×1
SUT VIC AB 0 CT1 27XBRD ANTBC (SUTURE) ×1 IMPLANT
SUT VIC AB 1 CT1 27 (SUTURE) ×2
SUT VIC AB 1 CT1 27XBRD ANTBC (SUTURE) ×2 IMPLANT
SUT VIC AB 2-0 CT1 27 (SUTURE) ×2
SUT VIC AB 2-0 CT1 TAPERPNT 27 (SUTURE) ×2 IMPLANT
TRAY FOLEY W/METER SILVER 16FR (SET/KITS/TRAYS/PACK) ×2 IMPLANT
WATER STERILE IRR 1000ML POUR (IV SOLUTION) ×2 IMPLANT
WRAP KNEE MAXI GEL POST OP (GAUZE/BANDAGES/DRESSINGS) ×2 IMPLANT
YANKAUER SUCT BULB TIP 10FT TU (MISCELLANEOUS) ×2 IMPLANT

## 2017-01-10 NOTE — Anesthesia Postprocedure Evaluation (Signed)
Anesthesia Post Note  Patient: Stephen Maldonado  Procedure(s) Performed: LEFT TOTAL KNEE ARTHROPLASTY (Left Knee)     Patient location during evaluation: PACU Anesthesia Type: Spinal Level of consciousness: awake and alert Pain management: pain level controlled Vital Signs Assessment: post-procedure vital signs reviewed and stable Respiratory status: spontaneous breathing and respiratory function stable Cardiovascular status: blood pressure returned to baseline and stable Postop Assessment: no headache, no backache, spinal receding and no apparent nausea or vomiting Anesthetic complications: no    Last Vitals:  Vitals:   01/10/17 1340 01/10/17 1433  BP: (!) 149/73 (!) 160/56  Pulse: 73 74  Resp: 19 16  Temp: (!) 36.3 C 36.5 C  SpO2: 91% (!) 88%    Last Pain:  Vitals:   01/10/17 1433  TempSrc: Axillary  PainSc:                  [name]

## 2017-01-10 NOTE — Anesthesia Procedure Notes (Signed)
Anesthesia Regional Block: Adductor canal block   Pre-Anesthetic Checklist: ,, timeout performed, Correct Patient, Correct Site, Correct Laterality, Correct Procedure, Correct Position, site marked, Risks and benefits discussed,  Surgical consent,  Pre-op evaluation,  At surgeon's request and post-op pain management  Laterality: Left and Lower  Prep: Maximum Sterile Barrier Precautions used, chloraprep       Needles:  Injection technique: Single-shot  Needle Type: Echogenic Stimulator Needle     Needle Length: 10cm      Additional Needles:   Procedures:,,,, ultrasound used (permanent image in chart),,,,  Narrative:  Start time: 01/10/2017 8:55 AM End time: 01/10/2017 9:05 AM Injection made incrementally with aspirations every 5 mL.  Performed by: Personally  Anesthesiologist: [name], MD  Additional Notes: Risks, benefits and alternative to block explained extensively.  Patient tolerated procedure well, without complications.

## 2017-01-10 NOTE — Anesthesia Procedure Notes (Signed)
Date/Time: 01/10/2017 9:07 AM Performed by: Glory Buff, CRNA Oxygen Delivery Method: Simple face mask

## 2017-01-10 NOTE — Progress Notes (Signed)
AssistedDr. Carignan with left, ultrasound guided, adductor canal block. Side rails up, monitors on throughout procedure. See vital signs in flow sheet. Tolerated Procedure well.  

## 2017-01-10 NOTE — Anesthesia Preprocedure Evaluation (Addendum)
Anesthesia Evaluation  Patient identified by MRN, date of birth, ID band Patient awake    Reviewed: Allergy & Precautions, NPO status , Patient's Chart, lab work & pertinent test results  History of Anesthesia Complications Negative for: history of anesthetic complications  Airway Mallampati: II  TM Distance: >3 FB Neck ROM: Full    Dental no notable dental hx. (+) Dental Advisory Given, Poor Dentition   Pulmonary neg pulmonary ROS,    Pulmonary exam normal breath sounds clear to auscultation       Cardiovascular hypertension, Pt. on medications Normal cardiovascular exam Rhythm:Regular Rate:Normal     Neuro/Psych negative neurological ROS  negative psych ROS   GI/Hepatic Neg liver ROS, GERD  ,  Endo/Other  obesity  Renal/GU negative Renal ROS  negative genitourinary   Musculoskeletal  (+) Arthritis ,   Abdominal   Peds negative pediatric ROS (+)  Hematology negative hematology ROS (+)   Anesthesia Other Findings   Reproductive/Obstetrics negative OB ROS                             Anesthesia Physical  Anesthesia Plan  ASA: II  Anesthesia Plan: Spinal   Post-op Pain Management:  Regional for Post-op pain   Induction:   PONV Risk Score and Plan: 1 and Ondansetron  Airway Management Planned: Simple Face Mask  Additional Equipment:   Intra-op Plan:   Post-operative Plan:   Informed Consent: I have reviewed the patients History and Physical, chart, labs and discussed the procedure including the risks, benefits and alternatives for the proposed anesthesia with the patient or authorized representative who has indicated his/her understanding and acceptance.   Dental advisory given  Plan Discussed with: CRNA  Anesthesia Plan Comments:         Anesthesia Quick Evaluation

## 2017-01-10 NOTE — H&P (Signed)
TOTAL KNEE ADMISSION H&P  Patient is being admitted for left total knee arthroplasty.  Subjective:  Chief Complaint:left knee pain.  HPI: Stephen Maldonado, 61 y.o. male, has a history of pain and functional disability in the left knee due to arthritis and has failed non-surgical conservative treatments for greater than 12 weeks to includeNSAID's and/or analgesics, corticosteriod injections, flexibility and strengthening excercises, use of assistive devices, weight reduction as appropriate and activity modification.  Onset of symptoms was gradual, starting 3 years ago with gradually worsening course since that time. The patient noted no past surgery on the left knee(s).  Patient currently rates pain in the left knee(s) at 10 out of 10 with activity. Patient has night pain, worsening of pain with activity and weight bearing, pain that interferes with activities of daily living, pain with passive range of motion, crepitus and joint swelling.  Patient has evidence of subchondral sclerosis, periarticular osteophytes and joint space narrowing by imaging studies. There is no active infection.  Patient Active Problem List   Diagnosis Date Noted  . Status post total left knee replacement 01/10/2017  . Chronic pain of left knee 11/04/2016  . Chronic bilateral low back pain with right-sided sciatica 11/04/2016  . Acute coronary syndrome ([place]) 07/10/2016  . Unilateral primary osteoarthritis, left knee 06/26/2016  . H/O total knee replacement, right 06/26/2016  . Unilateral primary osteoarthritis, right knee 12/01/2015  . Status post total right knee replacement 12/01/2015   Past Medical History:  Diagnosis Date  . Arthritis    knees  . Bladder spasms   . BPH (benign prostatic hyperplasia)   . DVT (deep venous thrombosis) ([place]) ocyt 2016   left leg tx medically  . GERD (gastroesophageal reflux disease)   . History of colon polyps    hyperplastic 06-14-2010  . Hydronephrosis   . Hypertension   .  Urinary retention     Past Surgical History:  Procedure Laterality Date  . COLONOSCOPY  06/14/2010  . CORONARY ANGIOGRAPHY N/A 07/11/2016   Procedure: Coronary Angiography;  Surgeon: Dixie Dials, MD;  Location: [facility];  Service: Cardiovascular;  Laterality: N/A;  . CYSTOSCOPY W/ RETROGRADES Bilateral 10/03/2015   Procedure: CYSTOSCOPY WITH RETROGRADE PYELOGRAM;  Surgeon: [name], MD;  Location[facility];  Service: Urology;  Laterality: Bilateral;  . GREEN LIGHT LASER TURP (TRANSURETHRAL RESECTION OF PROSTATE N/A 10/03/2015   Procedure: GREEN LIGHT LASER,  TURP - STAGED(TRANSURETHRAL RESECTION OF PROSTATE;  Surgeon: [name], MD;  Location: [facility];  Service: Urology;  Laterality: N/A;  . KNEE ARTHROSCOPY     not sure which knee  . QUADRICEPS TENDON REPAIR Bilateral left 02/24/2003/   right 2006  . TOTAL KNEE ARTHROPLASTY Right 12/01/2015   Procedure: RIGHT TOTAL KNEE ARTHROPLASTY;  Surgeon: [name], MD;  Location: [facility];  Service: Orthopedics;  Laterality: Right;    Current Facility-Administered Medications  Medication Dose Route Frequency Provider Last Rate Last Dose  . ceFAZolin (ANCEF) 3 g in dextrose 5 % 50 mL IVPB  3 g Intravenous On Call to [place], [place], [facility]      . chlorhexidine (HIBICLENS) 4 % liquid 4 application  60 mL Topical Once Erskine Emery W, PA-C      . fentaNYL (SUBLIMAZE) 100 MCG/2ML injection           . midazolam (VERSED) 2 MG/2ML injection           . tranexamic acid (CYKLOKAPRON) 1,000 mg in sodium  chloride 0.9 % 100 mL IVPB  1,000 mg Intravenous To OR Pete Pelt, PA-C       Allergies  Allergen Reactions  . Lipitor [Atorvastatin] Other (See Comments)    Muscle fatigue, soreness and dreams crazy    Social History   Tobacco Use  . Smoking status: Never Smoker  . Smokeless tobacco: Former Systems developer    Types: Chew  Substance Use Topics  . Alcohol use: Yes    Comment: 1-2  beers days    No family history on file.   Review of Systems  Musculoskeletal: Positive for joint pain.  All other systems reviewed and are negative.   Objective:  Physical Exam  Constitutional: He is oriented to person, place, and time. He appears well-developed and well-nourished.  HENT:  Head: Normocephalic and atraumatic.  Eyes: EOM are normal. Pupils are equal, round, and reactive to light.  Neck: Normal range of motion. Neck supple.  Cardiovascular: Normal rate and regular rhythm.  Respiratory: Effort normal and breath sounds normal.  GI: Soft. Bowel sounds are normal.  Musculoskeletal:       Left knee: He exhibits decreased range of motion, swelling, effusion and abnormal alignment. Tenderness found. Medial joint line and lateral joint line tenderness noted.  Neurological: He is alert and oriented to person, place, and time.  Skin: Skin is warm and dry.  Psychiatric: He has a normal mood and affect.    Vital signs in last 24 hours: Temp:  [98 F (36.7 C)] 98 F (36.7 C) (12/07 0718) Pulse Rate:  [63] 63 (12/07 0718) Resp:  [18] 18 (12/07 0718) BP: (151)/(99) 151/99 (12/07 0718) SpO2:  [96 %] 96 % (12/07 0718)  Labs:   Estimated body mass index is 43.03 kg/m as calculated from the following:   Height as of 01/06/17: 5\' 11"  (1.803 m).   Weight as of 01/06/17: 308 lb 8 oz (139.9 kg).   Imaging Review Plain radiographs demonstrate severe degenerative joint disease of the left knee(s). The overall alignment ismild varus. The bone quality appears to be excellent for age and reported activity level.  Assessment/Plan:  End stage arthritis, left knee   The patient history, physical examination, clinical judgment of the provider and imaging studies are consistent with end stage degenerative joint disease of the left knee(s) and total knee arthroplasty is deemed medically necessary. The treatment options including medical management, injection therapy arthroscopy and  arthroplasty were discussed at length. The risks and benefits of total knee arthroplasty were presented and reviewed. The risks due to aseptic loosening, infection, stiffness, patella tracking problems, thromboembolic complications and other imponderables were discussed. The patient acknowledged the explanation, agreed to proceed with the plan and consent was signed. Patient is being admitted for inpatient treatment for surgery, pain control, PT, OT, prophylactic antibiotics, VTE prophylaxis, progressive ambulation and ADL's and discharge planning. The patient is planning to be discharged home with home health services

## 2017-01-10 NOTE — Anesthesia Procedure Notes (Signed)
Spinal  Patient location during procedure: OR Start time: 01/10/2017 9:14 AM End time: 01/10/2017 9:19 AM Staffing Anesthesiologist: [name], MD Resident/CRNA: Glory Buff, CRNA Performed: resident/CRNA  Preanesthetic Checklist Completed: patient identified, site marked, surgical consent, pre-op evaluation, timeout performed, IV checked, risks and benefits discussed and monitors and equipment checked Spinal Block Patient position: sitting Prep: ChloraPrep Patient monitoring: continuous pulse ox, blood pressure and heart rate Approach: midline Location: L3-4 Needle Needle type: Pencan  Needle gauge: 24 G Needle length: 9 cm Needle insertion depth: 7 cm Assessment Sensory level: T6 Additional Notes Kit date checked and verified. - paraesthesia, - heme, + CSF pre and post injection, patient tolerated well, sensory level T6.

## 2017-01-10 NOTE — Brief Op Note (Signed)
01/10/2017  10:53 AM  PATIENT:  [name]  61 y.o. male  PRE-OPERATIVE DIAGNOSIS:  osteoarthritis left knee  POST-OPERATIVE DIAGNOSIS:  osteoarthritis left knee  PROCEDURE:  Procedure(s): LEFT TOTAL KNEE ARTHROPLASTY (Left)  SURGEON:  Surgeon(s) and Role:    [name], MD - Primary  PHYSICIAN ASSISTANT: Benita Stabile, PA-C  ANESTHESIA:   regional and spinal  EBL: less than 200 cc  COUNTS:  YES  TOURNIQUET:  * Missing tourniquet times found for documented tourniquets in log: [id number] *  DICTATION: .Other Dictation: Dictation Number [id number]  PLAN OF CARE: Admit for overnight observation  PATIENT DISPOSITION:  PACU - hemodynamically stable.   Delay start of Pharmacological VTE agent (>24hrs) due to surgical blood loss or risk of bleeding: no

## 2017-01-10 NOTE — Transfer of Care (Signed)
Immediate Anesthesia Transfer of Care Note  Patient: Stephen Maldonado  Procedure(s) Performed: LEFT TOTAL KNEE ARTHROPLASTY (Left Knee)  Patient Location: PACU  Anesthesia Type:Spinal  Level of Consciousness: sedated, patient cooperative and responds to stimulation  Airway & Oxygen Therapy: Patient Spontanous Breathing and Patient connected to face mask oxygen  Post-op Assessment: Report given to RN and Post -op Vital signs reviewed and stable  Post vital signs: Reviewed and stable  Last Vitals:  Vitals:   01/10/17 0901 01/10/17 0902  BP:    Pulse: 63 (!) 59  Resp: 17 11  Temp:    SpO2: (!) 75% 95%    Last Pain:  Vitals:   01/10/17 0750  TempSrc:   PainSc: 1       Patients Stated Pain Goal: 4 (24/93/24 1991)  Complications: No apparent anesthesia complications

## 2017-01-11 DIAGNOSIS — Z87898 Personal history of other specified conditions: Secondary | ICD-10-CM | POA: Diagnosis not present

## 2017-01-11 DIAGNOSIS — I10 Essential (primary) hypertension: Secondary | ICD-10-CM | POA: Diagnosis not present

## 2017-01-11 DIAGNOSIS — Z96652 Presence of left artificial knee joint: Secondary | ICD-10-CM

## 2017-01-11 DIAGNOSIS — Z888 Allergy status to other drugs, medicaments and biological substances status: Secondary | ICD-10-CM | POA: Diagnosis not present

## 2017-01-11 DIAGNOSIS — M5441 Lumbago with sciatica, right side: Secondary | ICD-10-CM | POA: Diagnosis not present

## 2017-01-11 DIAGNOSIS — M170 Bilateral primary osteoarthritis of knee: Secondary | ICD-10-CM | POA: Diagnosis not present

## 2017-01-11 DIAGNOSIS — M1712 Unilateral primary osteoarthritis, left knee: Secondary | ICD-10-CM | POA: Diagnosis not present

## 2017-01-11 DIAGNOSIS — Z6841 Body Mass Index (BMI) 40.0 and over, adult: Secondary | ICD-10-CM | POA: Diagnosis not present

## 2017-01-11 DIAGNOSIS — Z86010 Personal history of colonic polyps: Secondary | ICD-10-CM | POA: Diagnosis not present

## 2017-01-11 DIAGNOSIS — N400 Enlarged prostate without lower urinary tract symptoms: Secondary | ICD-10-CM | POA: Diagnosis not present

## 2017-01-11 DIAGNOSIS — E669 Obesity, unspecified: Secondary | ICD-10-CM | POA: Diagnosis not present

## 2017-01-11 DIAGNOSIS — Z96651 Presence of right artificial knee joint: Secondary | ICD-10-CM | POA: Diagnosis not present

## 2017-01-11 DIAGNOSIS — G8929 Other chronic pain: Secondary | ICD-10-CM | POA: Diagnosis not present

## 2017-01-11 DIAGNOSIS — K219 Gastro-esophageal reflux disease without esophagitis: Secondary | ICD-10-CM | POA: Diagnosis not present

## 2017-01-11 DIAGNOSIS — Z87891 Personal history of nicotine dependence: Secondary | ICD-10-CM | POA: Diagnosis not present

## 2017-01-11 LAB — BASIC METABOLIC PANEL
Anion gap: 6 (ref 5–15)
BUN: 25 mg/dL — AB (ref 6–20)
CHLORIDE: 102 mmol/L (ref 101–111)
CO2: 30 mmol/L (ref 22–32)
CREATININE: 1.99 mg/dL — AB (ref 0.61–1.24)
Calcium: 8.9 mg/dL (ref 8.9–10.3)
GFR calc Af Amer: 40 mL/min — ABNORMAL LOW (ref >60)
GFR, EST NON AFRICAN AMERICAN: 34 mL/min — AB (ref >60)
Glucose, Bld: 140 mg/dL — ABNORMAL HIGH (ref 65–99)
Potassium: 5.8 mmol/L — ABNORMAL HIGH (ref 3.5–5.1)
SODIUM: 138 mmol/L (ref 135–145)

## 2017-01-11 LAB — CBC
HCT: 42.2 % (ref 39.0–52.0)
Hemoglobin: 13.6 g/dL (ref 13.0–17.0)
MCH: 29.8 pg (ref 26.0–34.0)
MCHC: 32.2 g/dL (ref 30.0–36.0)
MCV: 92.3 fL (ref 78.0–100.0)
PLATELETS: 204 10*3/uL (ref 150–400)
RBC: 4.57 MIL/uL (ref 4.22–5.81)
RDW: 14.4 % (ref 11.5–15.5)
WBC: 13.6 10*3/uL — ABNORMAL HIGH (ref 4.0–10.5)

## 2017-01-11 MED ORDER — ASPIRIN 325 MG PO TBEC: 325.0000 mg | DELAYED_RELEASE_TABLET | Freq: Two times a day (BID) | ORAL | 0 refills | Status: DC

## 2017-01-11 MED ORDER — DOCUSATE SODIUM 100 MG PO CAPS: 100.0000 mg | ORAL_CAPSULE | Freq: Two times a day (BID) | ORAL | 0 refills | Status: DC

## 2017-01-11 MED ORDER — OXYCODONE HCL 5 MG PO TABS: 10.0000 mg | ORAL_TABLET | ORAL | 0 refills | Status: DC | PRN

## 2017-01-11 MED ORDER — RIVAROXABAN 10 MG PO TABS: 10.0000 mg | ORAL_TABLET | Freq: Every day | ORAL | 0 refills | Status: DC

## 2017-01-11 NOTE — Progress Notes (Signed)
OT Cancellation Note  Patient Details Name: Stephen Maldonado MRN: [id number] DOB: 30-Nov-1955   Cancelled Treatment:    Reason Eval/Treat Not Completed: OT screened, no needs identified, will sign off. Spoke to patient. He had a R TKA in 11/2015 and recalls all ADL techniques he utilized after that surgery. No OT needs at this time. Will sign off.  Davan Hark A Cybill Uriegas 01/11/2017, 10:10 AM

## 2017-01-11 NOTE — Discharge Summary (Signed)
Patient ID: Stephen Maldonado MRN: [id number] DOB/AGE: Mar 25, 1955 61 y.o.  Admit date: 01/10/2017 Discharge date: 01/11/2017  Admission Diagnoses:  Principal Problem:   Status post total left knee replacement   Discharge Diagnoses:  Same  Past Medical History:  Diagnosis Date  . Arthritis    knees  . Bladder spasms   . BPH (benign prostatic hyperplasia)   . DVT (deep venous thrombosis) ([place]) ocyt 2016   left leg tx medically  . GERD (gastroesophageal reflux disease)   . History of colon polyps    hyperplastic 06-14-2010  . Hydronephrosis   . Hypertension   . Urinary retention     Surgeries: Procedure(s): LEFT TOTAL KNEE ARTHROPLASTY on 01/10/2017   Consultants:   Discharged Condition: Improved  Hospital Course: Stephen Maldonado is an 61 y.o. male who was admitted 01/10/2017 for operative treatment ofStatus post total left knee replacement. Patient has severe unremitting pain that affects sleep, daily activities, and work/hobbies. After pre-op clearance the patient was taken to the operating room on 01/10/2017 and underwent  Procedure(s): LEFT TOTAL KNEE ARTHROPLASTY.    Patient was given perioperative antibiotics:  Anti-infectives (From admission, onward)   Start     Dose/Rate Route Frequency Ordered Stop   01/10/17 1500  ceFAZolin (ANCEF) IVPB 2g/100 mL premix     2 g 200 mL/hr over 30 Minutes Intravenous Every 6 hours 01/10/17 1245 01/11/17 0426   01/10/17 0600  ceFAZolin (ANCEF) 3 g in dextrose 5 % 50 mL IVPB     3 g 130 mL/hr over 30 Minutes Intravenous On call to O.R. 01/09/17 1125 01/10/17 0950       Patient was given sequential compression devices, early ambulation, and chemoprophylaxis to prevent DVT.  Patient benefited maximally from hospital stay and there were no complications.    Recent vital signs:  Patient Vitals for the past 24 hrs:  BP Temp Temp src Pulse Resp SpO2  01/11/17 1106 (!) 171/68 98.5 F (36.9 C) Oral 61 18 95 %  01/11/17 0811 - - - 63  - -  01/11/17 0810 (!) 164/62 - - - - -  01/11/17 0518 (!) 164/66 (!) 97.5 F (36.4 C) Oral 67 16 99 %  01/11/17 0245 (!) 161/67 98.1 F (36.7 C) Oral 75 16 98 %  01/10/17 2109 (!) 194/72 98 F (36.7 C) Oral - 19 97 %  01/10/17 1836 (!) 186/80 - - 85 - -  01/10/17 1739 (!) 162/76 98.2 F (36.8 C) Oral 91 18 93 %  01/10/17 1541 (!) 161/63 98 F (36.7 C) Axillary 80 16 93 %  01/10/17 1433 (!) 160/56 97.7 F (36.5 C) Axillary 74 16 (!) 88 %  01/10/17 1340 (!) 149/73 (!) 97.4 F (36.3 C) Oral 73 19 91 %     Recent laboratory studies:  Recent Labs    01/11/17 0509  WBC 13.6*  HGB 13.6  HCT 42.2  PLT 204  NA 138  K 5.8*  CL 102  CO2 30  BUN 25*  CREATININE 1.99*  GLUCOSE 140*  CALCIUM 8.9     Discharge Medications:   Allergies as of 01/11/2017      Reactions   Lipitor [atorvastatin] Other (See Comments)   Muscle fatigue, soreness and dreams crazy      Medication List    STOP taking these medications   aspirin 81 MG EC tablet     TAKE these medications   atorvastatin 40 MG tablet Commonly known as:  LIPITOR Take 1  tablet (40 mg total) by mouth daily at 6 PM.   CoQ10 100 MG Caps Take 100 mg by mouth daily.   cyclobenzaprine 10 MG tablet Commonly known as:  FLEXERIL Take 10 mg by mouth at bedtime as needed for muscle spasms.   diltiazem 240 MG 24 hr capsule Commonly known as:  CARDIZEM CD Take 240 mg by mouth daily.   docusate sodium 100 MG capsule Commonly known as:  COLACE Take 1 capsule (100 mg total) by mouth 2 (two) times daily.   Fish Oil 1000 MG Caps Take 1,000 mg by mouth daily.   furosemide 20 MG tablet Commonly known as:  LASIX Take 20-40 mg by mouth See admin instructions. Take 20 mg by mouth every other day alternating with 40 mg by mouth every other day   Garlic 6440 MG Caps Take 1,000 mg by mouth daily.   GLUCOSAMINE CHONDR COMPLEX PO Take 1 capsule by mouth daily.   hydrALAZINE 25 MG tablet Commonly known as:  APRESOLINE Take  25 mg by mouth 2 (two) times daily with a meal.   ICY HOT LIDOCAINE PLUS MENTHOL EX Apply 1 application topically as needed (for back pain).   metoprolol tartrate 25 MG tablet Commonly known as:  LOPRESSOR Take 0.5 tablets (12.5 mg total) by mouth 2 (two) times daily.   nitroGLYCERIN 0.4 MG SL tablet Commonly known as:  NITROSTAT Place 1 tablet (0.4 mg total) under the tongue every 5 (five) minutes x 3 doses as needed for chest pain.   oxyCODONE 5 MG immediate release tablet Commonly known as:  Oxy IR/ROXICODONE Take 2-3 tablets (10-15 mg total) by mouth every 4 (four) hours as needed for severe pain ((score 7 to 10)).   oxyCODONE-acetaminophen 5-325 MG tablet Commonly known as:  ROXICET Take 1-2 tablets by mouth every 8 (eight) hours as needed for severe pain.   rivaroxaban 10 MG Tabs tablet Commonly known as:  XARELTO Take 1 tablet (10 mg total) by mouth daily. Start taking on:  01/12/2017   simvastatin 20 MG tablet Commonly known as:  ZOCOR Take 20 mg by mouth every other day.   tamsulosin 0.4 MG Caps capsule Commonly known as:  FLOMAX Take 0.4 mg by mouth every morning.   tiZANidine 4 MG tablet Commonly known as:  ZANAFLEX TAKE 1 TABLET (4 MG TOTAL) BY MOUTH EVERY 8 (EIGHT) HOURS AS NEEDED FOR MUSCLE SPASMS.   traMADol 50 MG tablet Commonly known as:  ULTRAM Take 2 tablets (100 mg total) by mouth 3 (three) times daily as needed.   Turmeric 500 MG Tabs Take 500 mg by mouth daily.   vitamin B-12 1000 MCG tablet Commonly known as:  CYANOCOBALAMIN Take 1,000 mcg by mouth daily.   vitamin C 500 MG tablet Commonly known as:  ASCORBIC ACID Take 500 mg by mouth daily.   VITAMIN D-3 PO Take 2,000 Units by mouth daily.            Durable Medical Equipment  (From admission, onward)        Start     Ordered   01/10/17 1246  DME 3 n 1  Once     01/10/17 1245   01/10/17 1246  DME Walker rolling  Once    Question:  Patient needs a walker to treat with the  following condition  Answer:  Status post total left knee replacement   01/10/17 1245      Diagnostic Studies: Dg Knee Left Port  Result Date: 01/10/2017 CLINICAL  DATA:  Status post left knee replacement EXAM: PORTABLE LEFT KNEE - 2 VIEW COMPARISON:  10/18/2015 FINDINGS: Left knee replacement is noted. No acute bony or soft tissue abnormality is seen. IMPRESSION: Status post left knee replacement Electronically Signed   By: Inez Catalina M.D.   On: 01/10/2017 12:06    Disposition: 01-Home or Self Care  Discharge Instructions    Call MD / Call 911   Complete by:  As directed    If you experience chest pain or shortness of breath, CALL 911 and be transported to the hospital emergency room.  If you develope a fever above 101 F, pus (white drainage) or increased drainage or redness at the wound, or calf pain, call your surgeon's office.   Constipation Prevention   Complete by:  As directed    Drink plenty of fluids.  Prune juice may be helpful.  You may use a stool softener, such as Colace (over the counter) 100 mg twice a day.  Use MiraLax (over the counter) for constipation as needed.   Diet - low sodium heart healthy   Complete by:  As directed    Discharge instructions   Complete by:  As directed    Keep knee incision dry for 5 days post op then may wet while bathing. Therapy daily and CPM goal full extension and greater than 90 degrees flexion. Call if fever or chills or increased drainage. Go to ER if acutely short of breath or call for ambulance. Return for follow up in 2 weeks. May full weight bear on the surgical leg unless told otherwise. Use knee immobilizer until able to straight leg raise off bed with knee stable. In house walking for first 2 weeks. If there is drainage from the upper incision site you may remove the upper dressing or the whole dressing and apply dry 4x4s and paper tape. Keep dressing intact.  Start xarelto 10  Mg po daily on Sunday 01/12/2017, do not take  ASA.  INSTRUCTIONS AFTER JOINT REPLACEMENT   Remove items at home which could result in a fall. This includes throw rugs or furniture in walking pathways ICE to the affected joint every three hours while awake for 30 minutes at a time, for at least the first 3-5 days, and then as needed for pain and swelling.  Continue to use ice for pain and swelling. You may notice swelling that will progress down to the foot and ankle.  This is normal after surgery.  Elevate your leg when you are not up walking on it.   Continue to use the breathing machine you got in the hospital (incentive spirometer) which will help keep your temperature down.  It is common for your temperature to cycle up and down following surgery, especially at night when you are not up moving around and exerting yourself.  The breathing machine keeps your lungs expanded and your temperature down.   DIET:  As you were doing prior to hospitalization, we recommend a well-balanced diet.  DRESSING / WOUND CARE / SHOWERING  You may change your dressing 3-5 days after surgery.  Then change the dressing every day with sterile gauze.  Please use good hand washing techniques before changing the dressing.  Do not use any lotions or creams on the incision until instructed by your surgeon.  ACTIVITY  Increase activity slowly as tolerated, but follow the weight bearing instructions below.   No driving for 6 weeks or until further direction given by your physician.  You  cannot drive while taking narcotics.  No lifting or carrying greater than 10 lbs. until further directed by your surgeon. Avoid periods of inactivity such as sitting longer than an hour when not asleep. This helps prevent blood clots.  You may return to work once you are authorized by your doctor.     WEIGHT BEARING   Weight bearing as tolerated with assist device (walker, cane, etc) as directed, use it as long as suggested by your surgeon or therapist, typically at least 4-6  weeks.   EXERCISES  Results after joint replacement surgery are often greatly improved when you follow the exercise, range of motion and muscle strengthening exercises prescribed by your doctor. Safety measures are also important to protect the joint from further injury. Any time any of these exercises cause you to have increased pain or swelling, decrease what you are doing until you are comfortable again and then slowly increase them. If you have problems or questions, call your caregiver or physical therapist for advice.   Rehabilitation is important following a joint replacement. After just a few days of immobilization, the muscles of the leg can become weakened and shrink (atrophy).  These exercises are designed to build up the tone and strength of the thigh and leg muscles and to improve motion. Often times heat used for twenty to thirty minutes before working out will loosen up your tissues and help with improving the range of motion but do not use heat for the first two weeks following surgery (sometimes heat can increase post-operative swelling).   These exercises can be done on a training (exercise) mat, on the floor, on a table or on a bed. Use whatever works the best and is most comfortable for you.    Use music or television while you are exercising so that the exercises are a pleasant break in your day. This will make your life better with the exercises acting as a break in your routine that you can look forward to.   Perform all exercises about fifteen times, three times per day or as directed.  You should exercise both the operative leg and the other leg as well.  Exercises include:   Quad Sets - Tighten up the muscle on the front of the thigh (Quad) and hold for 5-10 seconds.   Straight Leg Raises - With your knee straight (if you were given a brace, keep it on), lift the leg to 60 degrees, hold for 3 seconds, and slowly lower the leg.  Perform this exercise against resistance later as  your leg gets stronger.  Leg Slides: Lying on your back, slowly slide your foot toward your buttocks, bending your knee up off the floor (only go as far as is comfortable). Then slowly slide your foot back down until your leg is flat on the floor again.  Angel Wings: Lying on your back spread your legs to the side as far apart as you can without causing discomfort.  Hamstring Strength:  Lying on your back, push your heel against the floor with your leg straight by tightening up the muscles of your buttocks.  Repeat, but this time bend your knee to a comfortable angle, and push your heel against the floor.  You may put a pillow under the heel to make it more comfortable if necessary.   A rehabilitation program following joint replacement surgery can speed recovery and prevent re-injury in the future due to weakened muscles. Contact your doctor or a physical therapist for more  information on knee rehabilitation.    CONSTIPATION  Constipation is defined medically as fewer than three stools per week and severe constipation as less than one stool per week.  Even if you have a regular bowel pattern at home, your normal regimen is likely to be disrupted due to multiple reasons following surgery.  Combination of anesthesia, postoperative narcotics, change in appetite and fluid intake all can affect your bowels.   YOU MUST use at least one of the following options; they are listed in order of increasing strength to get the job done.  They are all available over the counter, and you may need to use some, POSSIBLY even all of these options:    Drink plenty of fluids (prune juice may be helpful) and high fiber foods Colace 100 mg by mouth twice a day  Senokot for constipation as directed and as needed Dulcolax (bisacodyl), take with full glass of water  Miralax (polyethylene glycol) once or twice a day as needed.  If you have tried all these things and are unable to have a bowel movement in the first 3-4 days  after surgery call either your surgeon or your primary doctor.    If you experience loose stools or diarrhea, hold the medications until you stool forms back up.  If your symptoms do not get better within 1 week or if they get worse, check with your doctor.  If you experience "the worst abdominal pain ever" or develop nausea or vomiting, please contact the office immediately for further recommendations for treatment.   ITCHING:  If you experience itching with your medications, try taking only a single pain pill, or even half a pain pill at a time.  You can also use Benadryl over the counter for itching or also to help with sleep.   TED HOSE STOCKINGS:  Use stockings on both legs until for at least 2 weeks or as directed by physician office. They may be removed at night for sleeping.  MEDICATIONS:  See your medication summary on the "After Visit Summary" that nursing will review with you.  You may have some home medications which will be placed on hold until you complete the course of blood thinner medication.  It is important for you to complete the blood thinner medication as prescribed.  PRECAUTIONS:  If you experience chest pain or shortness of breath - call 911 immediately for transfer to the hospital emergency department.   If you develop a fever greater that 101 F, purulent drainage from wound, increased redness or drainage from wound, foul odor from the wound/dressing, or calf pain - CONTACT YOUR SURGEON.                                                   FOLLOW-UP APPOINTMENTS:  If you do not already have a post-op appointment, please call the office for an appointment to be seen by your surgeon.  Guidelines for how soon to be seen are listed in your "After Visit Summary", but are typically between 1-4 weeks after surgery.  OTHER INSTRUCTIONS:   Knee Replacement:  Do not place pillow under knee, focus on keeping the knee straight while resting. CPM instructions: 0-90 degrees, 2 hours in the  morning, 2 hours in the afternoon, and 2 hours in the evening. Place foam block, curve side up under heel at  all times except when in CPM or when walking.  DO NOT modify, tear, cut, or change the foam block in any way.  MAKE SURE YOU:  Understand these instructions.  Get help right away if you are not doing well or get worse.    Thank you for letting us be a part of your medical care team.  It is a privilege we respect greatly.  We hope these instructions will help you stay on track for a fast and full recovery!   Discharge patient   Complete by:  As directed    Discharge disposition:  01-Home or Self Care   Discharge patient date:  01/11/2017   Driving restrictions   Complete by:  As directed    No driving for 3 weeks   Increase activity slowly as tolerated   Complete by:  As directed    Lifting restrictions   Complete by:  As directed    No lifting for 8 weeks      Follow-up Information    [name], MD Follow up in 2 week(s).   Specialty:  Orthopedic Surgery Contact information: [name] [address] [phone]            Signed: [name] 01/11/2017, 12:47 PM

## 2017-01-11 NOTE — Care Management Note (Signed)
Case Management Note  Patient Details  Name: Stephen Maldonado MRN: [id number] Date of Birth: 04/17/1955  Action/Plan: Patient discharging home today with [facility] services, CM talked to patient for [facility] choice, he chose [facility]; [facility] with [facility] called for arrangements; Patient stated that he does not want any DME, stated " I have everything I need at hom." Expected Discharge Date:  01/11/17               Expected Discharge Plan:  [place]  Discharge planning Services  CM Consult Choice offered to:  Patient  DME Arranged:  Other see comment DME Agency:   Pt refused DME - RW and 3:1  HH Arranged:  PT HH Agency:  [place]  Status of Service:  Completed, signed off  Sherrilyn Rist [phone] 01/11/2017, 1:27 PM

## 2017-01-11 NOTE — Progress Notes (Signed)
Physical Therapy Treatment Patient Details Name: Stephen Maldonado MRN: [id number] DOB: 12-Jun-1955 Today's Date: 01/11/2017    History of Present Illness Pt s/p L TKR and with hx of R TKR in 2017    PT Comments    POD #1 pm session Applied KI and instructed on use for stairs and amb "just first few days" until able to perform SLR.  Assisted with amb a greater distance in hallway with VC's to decreased gait speed to increase safety and to roll walker vs "pick up".  Returned to room and performed some TKR TE's following HEP handout when Ortho MD came in to change dressing and address questions.  Instructed pt and spouse on use of ICE and also addressed mobility questions.   Pt has met functional level to D/C to home today.    Follow Up Recommendations  Home health PT     Equipment Recommendations  None recommended by PT    Recommendations for Other Services OT consult     Precautions / Restrictions Precautions Precautions: Knee;Fall Precaution Booklet Issued: Yes (comment) Precaution Comments: instructed on KI use for stairs and amb "just first few days" until able to perform active SLR  Required Braces or Orthoses: Knee Immobilizer - Left Knee Immobilizer - Left: Discontinue once straight leg raise with < 10 degree lag Restrictions Weight Bearing Restrictions: No Other Position/Activity Restrictions: WBAT    Mobility  Bed Mobility Overal bed mobility: Needs Assistance Bed Mobility: Supine to Sit     Supine to sit: Min guard     General bed mobility comments: Pt OOB in recliner  Transfers Overall transfer level: Needs assistance Equipment used: Rolling walker (2 wheeled) Transfers: Sit to/from Stand Sit to Stand: Min guard;Supervision         General transfer comment: cues for LE management and use of UEs to self assist  pt slight impulsive  Ambulation/Gait Ambulation/Gait assistance: Supervision;Min guard Ambulation Distance (Feet): 225 Feet Assistive device:  Rolling walker (2 wheeled) Gait Pattern/deviations: Step-to pattern;Decreased step length - right;Decreased step length - left;Shuffle;Trunk flexed Gait velocity: decr Gait velocity interpretation: Below normal speed for age/gender General Gait Details: min cues for posture and position from RW and to decreased gait speed to increase safety   Stairs Stairs: Yes   Stair Management: One rail Left;Step to pattern;Forwards;With crutches Number of Stairs: 4 General stair comments: with spouse present practiced 4 steps wearing KI for increased support.   Wheelchair Mobility    Modified Rankin (Stroke Patients Only)       Balance Overall balance assessment: Needs assistance Sitting-balance support: Feet supported;No upper extremity supported Sitting balance-Leahy Scale: Good     Standing balance support: No upper extremity supported Standing balance-Leahy Scale: Fair                              Cognition Arousal/Alertness: Awake/alert Behavior During Therapy: WFL for tasks assessed/performed Overall Cognitive Status: Within Functional Limits for tasks assessed                                 General Comments: pt very knowledgable having had previous knee surgery      Exercises Total Joint Exercises Ankle Circles/Pumps: AROM;Both;20 reps;Supine Quad Sets: AROM;Both;10 reps;Supine Heel Slides: AAROM;Left;15 reps;Supine Straight Leg Raises: AAROM;AROM;Left;15 reps;Supine    General Comments        Pertinent Vitals/Pain Pain Assessment: 0-10  Pain Score: 3  Pain Location: L knee Pain Descriptors / Indicators: Aching;Sore;Operative site guarding Pain Intervention(s): Monitored during session;Repositioned;Premedicated before session;Ice applied    Home Living Family/patient expects to be discharged to:: Private residence Living Arrangements: Spouse/significant other Available Help at Discharge: Family Type of Home: House Home Access: Stairs  to enter Entrance Stairs-Rails: Right;Left Home Layout: One level Home Equipment: Environmental consultant - 2 wheels;Crutches;Cane - single point      Prior Function Level of Independence: Independent          PT Goals (current goals can now be found in the care plan section) Acute Rehab PT Goals Patient Stated Goal: Regain IND PT Goal Formulation: With patient Time For Goal Achievement: 01/12/17 Potential to Achieve Goals: Good Progress towards PT goals: Progressing toward goals    Frequency    7X/week      PT Plan Current plan remains appropriate    Co-evaluation              AM-PAC PT "6 Clicks" Daily Activity  Outcome Measure  Difficulty turning over in bed (including adjusting bedclothes, sheets and blankets)?: A Lot Difficulty moving from lying on back to sitting on the side of the bed? : A Lot Difficulty sitting down on and standing up from a chair with arms (e.g., wheelchair, bedside commode, etc,.)?: A Lot Help needed moving to and from a bed to chair (including a wheelchair)?: A Little Help needed walking in hospital room?: A Little Help needed climbing 3-5 steps with a railing? : A Little 6 Click Score: 15    End of Session Equipment Utilized During Treatment: Gait belt;Left knee immobilizer Activity Tolerance: Patient tolerated treatment well Patient left: in bed;with family/visitor present;with call bell/phone within reach Nurse Communication: (pt ready for D/C to home) PT Visit Diagnosis: Difficulty in walking, not elsewhere classified (R26.2)     Time: 5872-7618 PT Time Calculation (min) (ACUTE ONLY): 45 min  Charges:  $Gait Training: 8-22 mins $Therapeutic Exercise: 8-22 mins $Therapeutic Activity: 8-22 mins                    G Codes:       Rica Koyanagi  PTA [facility]  Acute  Rehab Pager      [phone]

## 2017-01-11 NOTE — Discharge Instructions (Addendum)
Keep knee incision dry for 5 days post op then may wet while bathing. Therapy daily and CPM goal full extension and greater than 90 degrees flexion. Call if fever or chills or increased drainage. Go to ER if acutely short of breath or call for ambulance. Return for follow up in 2 weeks. May full weight bear on the surgical leg unless told otherwise. Use knee immobilizer until able to straight leg raise off bed with knee stable. In house walking for first 2 weeks. If there is drainage from the upper incision site you may remove the upper dressing or the whole dressing and apply dry 4x4s and paper tape. Keep dressing intact.  INSTRUCTIONS AFTER JOINT REPLACEMENT   o Remove items at home which could result in a fall. This includes throw rugs or furniture in walking pathways o ICE to the affected joint every three hours while awake for 30 minutes at a time, for at least the first 3-5 days, and then as needed for pain and swelling.  Continue to use ice for pain and swelling. You may notice swelling that will progress down to the foot and ankle.  This is normal after surgery.  Elevate your leg when you are not up walking on it.   o Continue to use the breathing machine you got in the hospital (incentive spirometer) which will help keep your temperature down.  It is common for your temperature to cycle up and down following surgery, especially at night when you are not up moving around and exerting yourself.  The breathing machine keeps your lungs expanded and your temperature down.   DIET:  As you were doing prior to hospitalization, we recommend a well-balanced diet.  DRESSING / WOUND CARE / SHOWERING  You may change your dressing 3-5 days after surgery.  Then change the dressing every day with sterile gauze.  Please use good hand washing techniques before changing the dressing.  Do not use any lotions or creams on the incision until instructed by your surgeon.  ACTIVITY  o Increase activity  slowly as tolerated, but follow the weight bearing instructions below.   o No driving for 6 weeks or until further direction given by your physician.  You cannot drive while taking narcotics.  o No lifting or carrying greater than 10 lbs. until further directed by your surgeon. o Avoid periods of inactivity such as sitting longer than an hour when not asleep. This helps prevent blood clots.  o You may return to work once you are authorized by your doctor.     WEIGHT BEARING   Weight bearing as tolerated with assist device (walker, cane, etc) as directed, use it as long as suggested by your surgeon or therapist, typically at least 4-6 weeks.   EXERCISES  Results after joint replacement surgery are often greatly improved when you follow the exercise, range of motion and muscle strengthening exercises prescribed by your doctor. Safety measures are also important to protect the joint from further injury. Any time any of these exercises cause you to have increased pain or swelling, decrease what you are doing until you are comfortable again and then slowly increase them. If you have problems or questions, call your caregiver or physical therapist for advice.   Rehabilitation is important following a joint replacement. After just a few days of immobilization, the muscles of the leg can become weakened and shrink (atrophy).  These exercises are designed to build up the tone and strength of the thigh  and leg muscles and to improve motion. Often times heat used for twenty to thirty minutes before working out will loosen up your tissues and help with improving the range of motion but do not use heat for the first two weeks following surgery (sometimes heat can increase post-operative swelling).   These exercises can be done on a training (exercise) mat, on the floor, on a table or on a bed. Use whatever works the best and is most comfortable for you.    Use music or television while you are exercising so  that the exercises are a pleasant break in your day. This will make your life better with the exercises acting as a break in your routine that you can look forward to.   Perform all exercises about fifteen times, three times per day or as directed.  You should exercise both the operative leg and the other leg as well.  Exercises include:    Quad Sets - Tighten up the muscle on the front of the thigh (Quad) and hold for 5-10 seconds.    Straight Leg Raises - With your knee straight (if you were given a brace, keep it on), lift the leg to 60 degrees, hold for 3 seconds, and slowly lower the leg.  Perform this exercise against resistance later as your leg gets stronger.   Leg Slides: Lying on your back, slowly slide your foot toward your buttocks, bending your knee up off the floor (only go as far as is comfortable). Then slowly slide your foot back down until your leg is flat on the floor again.   Angel Wings: Lying on your back spread your legs to the side as far apart as you can without causing discomfort.   Hamstring Strength:  Lying on your back, push your heel against the floor with your leg straight by tightening up the muscles of your buttocks.  Repeat, but this time bend your knee to a comfortable angle, and push your heel against the floor.  You may put a pillow under the heel to make it more comfortable if necessary.   A rehabilitation program following joint replacement surgery can speed recovery and prevent re-injury in the future due to weakened muscles. Contact your doctor or a physical therapist for more information on knee rehabilitation.    CONSTIPATION  Constipation is defined medically as fewer than three stools per week and severe constipation as less than one stool per week.  Even if you have a regular bowel pattern at home, your normal regimen is likely to be disrupted due to multiple reasons following surgery.  Combination of anesthesia, postoperative narcotics, change in  appetite and fluid intake all can affect your bowels.   YOU MUST use at least one of the following options; they are listed in order of increasing strength to get the job done.  They are all available over the counter, and you may need to use some, POSSIBLY even all of these options:    Drink plenty of fluids (prune juice may be helpful) and high fiber foods Colace 100 mg by mouth twice a day  Senokot for constipation as directed and as needed Dulcolax (bisacodyl), take with full glass of water  Miralax (polyethylene glycol) once or twice a day as needed.  If you have tried all these things and are unable to have a bowel movement in the first 3-4 days after surgery call either your surgeon or your primary doctor.    If you experience loose stools  or diarrhea, hold the medications until you stool forms back up.  If your symptoms do not get better within 1 week or if they get worse, check with your doctor.  If you experience "the worst abdominal pain ever" or develop nausea or vomiting, please contact the office immediately for further recommendations for treatment.   ITCHING:  If you experience itching with your medications, try taking only a single pain pill, or even half a pain pill at a time.  You can also use Benadryl over the counter for itching or also to help with sleep.   TED HOSE STOCKINGS:  Use stockings on both legs until for at least 2 weeks or as directed by physician office. They may be removed at night for sleeping.  MEDICATIONS:  See your medication summary on the After Visit Summary that nursing will review with you.  You may have some home medications which will be placed on hold until you complete the course of blood thinner medication.  It is important for you to complete the blood thinner medication as prescribed.  PRECAUTIONS:  If you experience chest pain or shortness of breath - call 911 immediately for transfer to the hospital emergency department.   If you develop a  fever greater that 101 F, purulent drainage from wound, increased redness or drainage from wound, foul odor from the wound/dressing, or calf pain - CONTACT YOUR SURGEON.                                                   FOLLOW-UP APPOINTMENTS:  If you do not already have a post-op appointment, please call the office for an appointment to be seen by your surgeon.  Guidelines for how soon to be seen are listed in your After Visit Summary, but are typically between 1-4 weeks after surgery.  OTHER INSTRUCTIONS:   Knee Replacement:  Do not place pillow under knee, focus on keeping the knee straight while resting. CPM instructions: 0-90 degrees, 2 hours in the morning, 2 hours in the afternoon, and 2 hours in the evening. Place foam block, curve side up under heel at all times except when in CPM or when walking.  DO NOT modify, tear, cut, or change the foam block in any way.  MAKE SURE YOU:   Understand these instructions.   Get help right away if you are not doing well or get worse.    Thank you for letting us be a part of your medical care team.  It is a privilege we respect greatly.  We hope these instructions will help you stay on track for a fast and full recovery!

## 2017-01-11 NOTE — Evaluation (Signed)
Physical Therapy Evaluation Patient Details Name: Stephen Maldonado MRN: [id number] DOB: [birth year] Today's Date: 01/11/2017   History of Present Illness  Pt s/p L TKR and with hx of R TKR in 2017  Clinical Impression  Pt s/p L TKR and presents with decreased L LE strength/ROM and post op pain limiting functional mobility.  Pt should progress well to dc home with family assist.    Follow Up Recommendations Home health PT    Equipment Recommendations  None recommended by PT    Recommendations for Other Services OT consult     Precautions / Restrictions Precautions Precautions: Knee;Fall Required Braces or Orthoses: Knee Immobilizer - Left Knee Immobilizer - Left: Discontinue once straight leg raise with < 10 degree lag Restrictions Weight Bearing Restrictions: No Other Position/Activity Restrictions: WBAT      Mobility  Bed Mobility Overal bed mobility: Needs Assistance Bed Mobility: Supine to Sit     Supine to sit: Min guard     General bed mobility comments: cues for sequence and use of R LE to self assist  Transfers Overall transfer level: Needs assistance Equipment used: Rolling walker (2 wheeled) Transfers: Sit to/from Stand Sit to Stand: Min guard         General transfer comment: cues for LE management and use of UEs to self assist  Ambulation/Gait Ambulation/Gait assistance: Min guard Ambulation Distance (Feet): 150 Feet Assistive device: Rolling walker (2 wheeled) Gait Pattern/deviations: Step-to pattern;Decreased step length - right;Decreased step length - left;Shuffle;Trunk flexed Gait velocity: decr Gait velocity interpretation: Below normal speed for age/gender General Gait Details: min cues for posture and position from [employer]            Wheelchair Mobility    Modified Rankin (Stroke Patients Only)       Balance Overall balance assessment: Needs assistance Sitting-balance support: Feet supported;No upper extremity  supported Sitting balance-Leahy Scale: Good     Standing balance support: No upper extremity supported Standing balance-Leahy Scale: Fair                               Pertinent Vitals/Pain Pain Assessment: 0-10 Pain Score: 3  Pain Location: L knee Pain Descriptors / Indicators: Aching;Sore Pain Intervention(s): Limited activity within patient's tolerance;Monitored during session;Premedicated before session;Ice applied    Home Living Family/patient expects to be discharged to:: Private residence Living Arrangements: Spouse/significant other Available Help at Discharge: Family Type of Home: House Home Access: Stairs to enter Entrance Stairs-Rails: Psychiatric nurse of Steps: 4 Home Layout: One level Home Equipment: Environmental consultant - 2 wheels;Crutches;Cane - single point      Prior Function Level of Independence: Independent               Hand Dominance        Extremity/Trunk Assessment   Upper Extremity Assessment Upper Extremity Assessment: Overall WFL for tasks assessed    Lower Extremity Assessment Lower Extremity Assessment: LLE deficits/detail LLE Deficits / Details: 3/5 quads with IND SLR and AAROM at knee -8 - 45    Cervical / Trunk Assessment Cervical / Trunk Assessment: Normal  Communication   Communication: No difficulties  Cognition Arousal/Alertness: Awake/alert Behavior During Therapy: WFL for tasks assessed/performed Overall Cognitive Status: Within Functional Limits for tasks assessed  General Comments      Exercises Total Joint Exercises Ankle Circles/Pumps: AROM;Both;20 reps;Supine Quad Sets: AROM;Both;10 reps;Supine Heel Slides: AAROM;Left;15 reps;Supine Straight Leg Raises: AAROM;AROM;Left;15 reps;Supine   Assessment/Plan    PT Assessment Patient needs continued PT services  PT Problem List Decreased strength;Decreased range of motion;Decreased activity  tolerance;Decreased mobility;Decreased knowledge of use of DME;Obesity;Pain       PT Treatment Interventions DME instruction;Gait training;Stair training;Functional mobility training;Therapeutic activities;Therapeutic exercise;Patient/family education    PT Goals (Current goals can be found in the Care Plan section)  Acute Rehab PT Goals Patient Stated Goal: Regain IND PT Goal Formulation: With patient Time For Goal Achievement: 01/12/17 Potential to Achieve Goals: Good    Frequency 7X/week   Barriers to discharge        Co-evaluation               AM-PAC PT "6 Clicks" Daily Activity  Outcome Measure Difficulty turning over in bed (including adjusting bedclothes, sheets and blankets)?: A Lot Difficulty moving from lying on back to sitting on the side of the bed? : A Lot Difficulty sitting down on and standing up from a chair with arms (e.g., wheelchair, bedside commode, etc,.)?: A Lot Help needed moving to and from a bed to chair (including a wheelchair)?: A Little Help needed walking in hospital room?: A Little Help needed climbing 3-5 steps with a railing? : A Little 6 Click Score: 15    End of Session Equipment Utilized During Treatment: Gait belt;Left knee immobilizer Activity Tolerance: Patient tolerated treatment well Patient left: in chair;with call bell/phone within reach Nurse Communication: Mobility status PT Visit Diagnosis: Difficulty in walking, not elsewhere classified (R26.2)    Time: 8119-1478 PT Time Calculation (min) (ACUTE ONLY): 27 min   Charges:   PT Evaluation $PT Eval Low Complexity: 1 Low PT Treatments $Therapeutic Exercise: 8-22 mins   PT G Codes:        Pg [phone]   [name] 01/11/2017, 12:59 PM
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
## 2017-01-12 NOTE — Op Note (Signed)
NAME:  Stephen Maldonado, Stephen Maldonado                     ACCOUNT NO.:  MEDICAL RECORD NO.:  [id number]  LOCATION:                                 FACILITY:  PHYSICIAN:  Lind Guest. Ninfa Linden, M.D.DATE OF BIRTH:  DATE OF PROCEDURE:  01/10/2017 DATE OF DISCHARGE:                              OPERATIVE REPORT   PREOPERATIVE DIAGNOSIS:  Severe osteoarthritis and degenerative joint disease, left knee.  POSTOPERATIVE DIAGNOSIS:  Severe osteoarthritis and degenerative joint disease, left knee.  PROCEDURE:  Left total knee arthroplasty.  IMPLANTS:  Stryker Triathlon press-fit knee system with size 5 press-fit femur, size 4 press-fit tibial tray, 11 mm fix-bearing polyethylene insert, size 35 press-fit patellar button.  SURGEON:  Lind Guest. Ninfa Linden, M.D.  ANESTHESIA: 1. Left lower extremity adductor canal block. 2. Spinal.  TOURNIQUET TIME:  Under 1 hour.  ANTIBIOTICS:  3 g of IV Ancef.  BLOOD LOSS:  Less than 200 mL.  COMPLICATIONS:  None.  INDICATIONS:  Stephen Maldonado is a very pleasant 61 year old active individual with severe osteoarthritis and degenerative joint disease of his left knee.  This has become debilitating for him.  He actually had a successful right total knee arthroplasty a year ago and now wished to have the left one replaced.  He has tried and failed all forms of conservative treatment.  He understands the risk of acute blood loss anemia, nerve and vessel injury, fracture, infection, and DVT.  He understands our goals are to decrease pain, improve mobility, and overall improve quality of life.  PROCEDURE DESCRIPTION:  After informed consent was obtained, appropriate left knee was marked.  He was brought to the operating room after an adductor canal block was obtained in the holding area.  In the operating room, he was sat up on the operating table and spinal anesthesia was obtained.  He was then laid in a supine position, a Foley catheter was placed, and then a  nonsterile tourniquet was placed around his upper left thigh.  His left leg was prepped and draped from the thigh down the ankle with DuraPrep and sterile drapes including a sterile stockinette. Time-out was called and he was identified as correct patient and correct left knee.  We then used an Esmarch to wrap out the leg and tourniquet was inflated to 300 mm of pressure.  I made a direct midline incision over the patella and carried this proximally and distally.  I dissected down the knee joint and carried out a medial parapatellar arthrotomy.  I found a large joint effusion and significant severe arthritis throughout his knee.  With the knee in a flexed position, we removed remnants of ACL, PCL, medial, and lateral meniscus.  We set our extramedullary cutting guide for tibia cut to take 9 mm off the high side correcting for varus and valgus and neutral slope.  We made this cut without difficulty.  We then used a drill in the intercondylar area for intramedullary distal femoral cutting guide, setting this for a 10-mm distal femoral cut.  We made our 10 mm distal femoral cut then off the guide setting it for left knee at 5 degrees externally rotated.  We made this  cut without difficulty.  We brought the leg back down to full extension and cleaned further remnants of the medial and lateral meniscus from the back of the knee, and I placed a 9 mm extension block, and we had achieved full extension.  We went back to the femur and put our femoral sizing guide based off the epicondylar axis.  We set this for 3 degrees externally rotated.  We chose a size 5 femur based off this.  We put a 4-in-1 cutting block for a size 5 femur and made our anterior-posterior cuts followed by our chamfer cuts.  We then made our femoral box cut.  We then went back to the tibia and sized the size 4 tibia tray based off rotation off the tibial tubercle and the femur.  We made our keel punch off this.  With the size  4 tibia and the size 5 femur, we trialed a 9 mm fix-bearing polyethylene insert, and we were pleased with the range of motion and stability.  We then made our patellar cut and drilled 3 holes for a press-fit patellar button size 35.  We removed all instrumentation and irrigated the knee with normal saline solution.  We then placed our real press-fit Stryker Triathlon tibia size 4 followed by our size 5 left femur.  We placed a 9 mm fix- bearing polyethylene insert and press-fit our patellar button.  I was surprised that he did stretch out a little bit and I felt that we needed to go with the 11 poly, so we removed the 9 poly and placed the real 11 poly insert.  I was pleased with stability and range of motion then.  We then irrigated the knee one more time and closed our arthrotomy with interrupted #1 Vicryl suture followed by 0 Vicryl in the deep tissue, 2- 0 Vicryl in the subcutaneous tissue, interrupted staples on the skin.  A well-padded sterile dressing was applied.  He was taken to the recovery room in stable condition.  All final counts were correct.  There were no complications noted.     Lind Guest. Ninfa Linden, M.D.     CYB/MEDQ  D:  01/10/2017  T:  01/11/2017  Job:  [id number]

## 2017-01-13 NOTE — Progress Notes (Signed)
   01/11/17 1200  PT G-Codes **NOT FOR INPATIENT CLASS**  Functional Assessment Tool Used AM-PAC 6 Clicks Basic Mobility;Clinical judgement  Mobility: Walking and Moving Around Current Status (O2947) CI  Mobility: Walking and Moving Around Goal Status (M5465) CI  G- codes entered for Debe Coder according to his evaluation documentation.  Clide Dales, PT Pager: [phone] 01/13/2017

## 2017-01-14 NOTE — Progress Notes (Signed)
[facility] and they DO have Rx

## 2017-01-15 DIAGNOSIS — I10 Essential (primary) hypertension: Secondary | ICD-10-CM | POA: Diagnosis not present

## 2017-01-15 DIAGNOSIS — Z79891 Long term (current) use of opiate analgesic: Secondary | ICD-10-CM | POA: Diagnosis not present

## 2017-01-15 DIAGNOSIS — K219 Gastro-esophageal reflux disease without esophagitis: Secondary | ICD-10-CM | POA: Diagnosis not present

## 2017-01-15 DIAGNOSIS — Z96651 Presence of right artificial knee joint: Secondary | ICD-10-CM | POA: Diagnosis not present

## 2017-01-15 DIAGNOSIS — Z471 Aftercare following joint replacement surgery: Secondary | ICD-10-CM | POA: Diagnosis not present

## 2017-01-15 DIAGNOSIS — N400 Enlarged prostate without lower urinary tract symptoms: Secondary | ICD-10-CM | POA: Diagnosis not present

## 2017-01-15 DIAGNOSIS — Z96652 Presence of left artificial knee joint: Secondary | ICD-10-CM | POA: Diagnosis not present

## 2017-01-15 DIAGNOSIS — Z7901 Long term (current) use of anticoagulants: Secondary | ICD-10-CM | POA: Diagnosis not present

## 2017-01-16 DIAGNOSIS — Z96652 Presence of left artificial knee joint: Secondary | ICD-10-CM | POA: Diagnosis not present

## 2017-01-17 ENCOUNTER — Other Ambulatory Visit (INDEPENDENT_AMBULATORY_CARE_PROVIDER_SITE_OTHER): Payer: Self-pay | Admitting: Family

## 2017-01-17 ENCOUNTER — Telehealth (INDEPENDENT_AMBULATORY_CARE_PROVIDER_SITE_OTHER): Payer: Self-pay | Admitting: Radiology

## 2017-01-17 DIAGNOSIS — K219 Gastro-esophageal reflux disease without esophagitis: Secondary | ICD-10-CM | POA: Diagnosis not present

## 2017-01-17 DIAGNOSIS — N400 Enlarged prostate without lower urinary tract symptoms: Secondary | ICD-10-CM | POA: Diagnosis not present

## 2017-01-17 DIAGNOSIS — Z7901 Long term (current) use of anticoagulants: Secondary | ICD-10-CM | POA: Diagnosis not present

## 2017-01-17 DIAGNOSIS — Z79891 Long term (current) use of opiate analgesic: Secondary | ICD-10-CM | POA: Diagnosis not present

## 2017-01-17 DIAGNOSIS — I10 Essential (primary) hypertension: Secondary | ICD-10-CM | POA: Diagnosis not present

## 2017-01-17 DIAGNOSIS — Z96651 Presence of right artificial knee joint: Secondary | ICD-10-CM | POA: Diagnosis not present

## 2017-01-17 DIAGNOSIS — Z96652 Presence of left artificial knee joint: Secondary | ICD-10-CM | POA: Diagnosis not present

## 2017-01-17 DIAGNOSIS — Z471 Aftercare following joint replacement surgery: Secondary | ICD-10-CM | POA: Diagnosis not present

## 2017-01-17 MED ORDER — OXYCODONE HCL 5 MG PO TABS: 5.0000 mg | ORAL_TABLET | ORAL | 0 refills | Status: DC | PRN

## 2017-01-17 NOTE — Telephone Encounter (Signed)
Patient wanting refill on oxycodone, only has 2 tablets. Status post TKA with Dr. Ninfa Linden. Per Junie Panning ok to refill advised physical rx has to be picked up in pharmacy due to being controlled substance.

## 2017-01-17 NOTE — Telephone Encounter (Signed)
Patient aware rx at front desk, his wife is going to pick up rx Lyndal Rainbow, advised to make sure she has photo id.

## 2017-01-20 ENCOUNTER — Telehealth (INDEPENDENT_AMBULATORY_CARE_PROVIDER_SITE_OTHER): Payer: Self-pay | Admitting: Radiology

## 2017-01-20 DIAGNOSIS — Z471 Aftercare following joint replacement surgery: Secondary | ICD-10-CM | POA: Diagnosis not present

## 2017-01-20 DIAGNOSIS — K219 Gastro-esophageal reflux disease without esophagitis: Secondary | ICD-10-CM | POA: Diagnosis not present

## 2017-01-20 DIAGNOSIS — Z96652 Presence of left artificial knee joint: Secondary | ICD-10-CM | POA: Diagnosis not present

## 2017-01-20 DIAGNOSIS — Z7901 Long term (current) use of anticoagulants: Secondary | ICD-10-CM | POA: Diagnosis not present

## 2017-01-20 DIAGNOSIS — Z79891 Long term (current) use of opiate analgesic: Secondary | ICD-10-CM | POA: Diagnosis not present

## 2017-01-20 DIAGNOSIS — Z96651 Presence of right artificial knee joint: Secondary | ICD-10-CM | POA: Diagnosis not present

## 2017-01-20 DIAGNOSIS — I10 Essential (primary) hypertension: Secondary | ICD-10-CM | POA: Diagnosis not present

## 2017-01-20 DIAGNOSIS — N400 Enlarged prostate without lower urinary tract symptoms: Secondary | ICD-10-CM | POA: Diagnosis not present

## 2017-01-20 NOTE — Telephone Encounter (Signed)
Error

## 2017-01-22 DIAGNOSIS — Z7901 Long term (current) use of anticoagulants: Secondary | ICD-10-CM | POA: Diagnosis not present

## 2017-01-22 DIAGNOSIS — K219 Gastro-esophageal reflux disease without esophagitis: Secondary | ICD-10-CM | POA: Diagnosis not present

## 2017-01-22 DIAGNOSIS — I10 Essential (primary) hypertension: Secondary | ICD-10-CM | POA: Diagnosis not present

## 2017-01-22 DIAGNOSIS — Z96651 Presence of right artificial knee joint: Secondary | ICD-10-CM | POA: Diagnosis not present

## 2017-01-22 DIAGNOSIS — Z79891 Long term (current) use of opiate analgesic: Secondary | ICD-10-CM | POA: Diagnosis not present

## 2017-01-22 DIAGNOSIS — Z471 Aftercare following joint replacement surgery: Secondary | ICD-10-CM | POA: Diagnosis not present

## 2017-01-22 DIAGNOSIS — Z96652 Presence of left artificial knee joint: Secondary | ICD-10-CM | POA: Diagnosis not present

## 2017-01-22 DIAGNOSIS — N400 Enlarged prostate without lower urinary tract symptoms: Secondary | ICD-10-CM | POA: Diagnosis not present

## 2017-01-23 ENCOUNTER — Encounter (INDEPENDENT_AMBULATORY_CARE_PROVIDER_SITE_OTHER): Payer: Self-pay | Admitting: Orthopaedic Surgery

## 2017-01-23 ENCOUNTER — Ambulatory Visit (INDEPENDENT_AMBULATORY_CARE_PROVIDER_SITE_OTHER): Payer: BLUE CROSS/BLUE SHIELD | Admitting: Orthopaedic Surgery

## 2017-01-23 DIAGNOSIS — Z96652 Presence of left artificial knee joint: Secondary | ICD-10-CM

## 2017-01-23 MED ORDER — OXYCODONE-ACETAMINOPHEN 5-325 MG PO TABS: 1.0000 | ORAL_TABLET | Freq: Three times a day (TID) | ORAL | 0 refills | Status: DC | PRN

## 2017-01-23 NOTE — Progress Notes (Signed)
The patient is 2 weeks tomorrow status post a left total knee arthroplasty.  We overuse a Stryker press-fit knee system.  He says he is actually doing very well and this feels better than his other knee.  He is Artie been in the past 90 degrees.  On exam his calf is soft.  His staples are intact we will remove the staples apply Steri-Strips.  He has almost full extension to at least 90 degrees flexion today.  This point he wants to try therapy on his own and not go to outpatient therapy and he feels that he can do this.  I will reevaluate in 4 weeks he is doing overall no x-rays are needed.  I did refill his oxycodone today.

## 2017-02-12 ENCOUNTER — Other Ambulatory Visit (INDEPENDENT_AMBULATORY_CARE_PROVIDER_SITE_OTHER): Payer: Self-pay | Admitting: Specialist

## 2017-02-12 NOTE — Telephone Encounter (Signed)
He is on this because of his Total knee replacement, Dr. Ninfa Linden should address, Rx given at discharge from hospital as I was on call. jen

## 2017-02-12 NOTE — Telephone Encounter (Signed)
xarelto Refill Request

## 2017-02-13 NOTE — Telephone Encounter (Signed)
See below

## 2017-02-13 NOTE — Telephone Encounter (Signed)
He does not need to be on this anymore unless he had a blood clot

## 2017-02-13 NOTE — Telephone Encounter (Signed)
[facility] patient 

## 2017-02-14 NOTE — Telephone Encounter (Signed)
He does not need to be on it any more

## 2017-02-24 ENCOUNTER — Encounter (INDEPENDENT_AMBULATORY_CARE_PROVIDER_SITE_OTHER): Payer: Self-pay | Admitting: Orthopaedic Surgery

## 2017-02-24 ENCOUNTER — Ambulatory Visit (INDEPENDENT_AMBULATORY_CARE_PROVIDER_SITE_OTHER): Payer: BLUE CROSS/BLUE SHIELD | Admitting: Orthopaedic Surgery

## 2017-02-24 DIAGNOSIS — Z96652 Presence of left artificial knee joint: Secondary | ICD-10-CM

## 2017-02-24 DIAGNOSIS — Z96651 Presence of right artificial knee joint: Secondary | ICD-10-CM

## 2017-02-24 NOTE — Progress Notes (Signed)
The patient is now 45 days status post left total knee arthroplasty and 15 months status post a right total knee arthroplasty.  He said the left knee is made more progress at this point in the right knee did at the same point.  He is doing well overall.  Is walking without any assistive device.  On examination left knee does still have a mild effusion but overall range of motion is excellent as  is with the right knee.  This point we do not need to see him back for 6 months.  I would like an AP and lateral both knees at that visit.  All questions and concerns were answered and addressed.

## 2017-08-14 ENCOUNTER — Other Ambulatory Visit: Payer: Self-pay | Admitting: Internal Medicine

## 2017-08-14 ENCOUNTER — Ambulatory Visit
Admission: RE | Admit: 2017-08-14 | Discharge: 2017-08-14 | Disposition: A | Discharge status: Home / Self Care | Payer: BLUE CROSS/BLUE SHIELD | Source: Ambulatory Visit | Attending: Internal Medicine | Admitting: Internal Medicine

## 2017-08-14 DIAGNOSIS — Z86718 Personal history of other venous thrombosis and embolism: Secondary | ICD-10-CM | POA: Diagnosis not present

## 2017-08-14 DIAGNOSIS — M79604 Pain in right leg: Secondary | ICD-10-CM

## 2017-08-14 DIAGNOSIS — M7989 Other specified soft tissue disorders: Secondary | ICD-10-CM | POA: Diagnosis not present

## 2017-08-14 DIAGNOSIS — R609 Edema, unspecified: Secondary | ICD-10-CM

## 2017-08-15 DIAGNOSIS — Z86718 Personal history of other venous thrombosis and embolism: Secondary | ICD-10-CM | POA: Diagnosis not present

## 2017-08-15 DIAGNOSIS — I129 Hypertensive chronic kidney disease with stage 1 through stage 4 chronic kidney disease, or unspecified chronic kidney disease: Secondary | ICD-10-CM | POA: Diagnosis not present

## 2017-08-15 DIAGNOSIS — M79604 Pain in right leg: Secondary | ICD-10-CM | POA: Diagnosis not present

## 2017-08-15 DIAGNOSIS — M7989 Other specified soft tissue disorders: Secondary | ICD-10-CM | POA: Diagnosis not present

## 2017-08-21 DIAGNOSIS — I129 Hypertensive chronic kidney disease with stage 1 through stage 4 chronic kidney disease, or unspecified chronic kidney disease: Secondary | ICD-10-CM | POA: Diagnosis not present

## 2017-08-25 ENCOUNTER — Ambulatory Visit (INDEPENDENT_AMBULATORY_CARE_PROVIDER_SITE_OTHER): Payer: BLUE CROSS/BLUE SHIELD | Admitting: Orthopaedic Surgery

## 2017-08-25 ENCOUNTER — Encounter (INDEPENDENT_AMBULATORY_CARE_PROVIDER_SITE_OTHER): Payer: Self-pay | Admitting: Orthopaedic Surgery

## 2017-08-25 ENCOUNTER — Ambulatory Visit (INDEPENDENT_AMBULATORY_CARE_PROVIDER_SITE_OTHER): Payer: BLUE CROSS/BLUE SHIELD

## 2017-08-25 DIAGNOSIS — M19011 Primary osteoarthritis, right shoulder: Secondary | ICD-10-CM

## 2017-08-25 DIAGNOSIS — Z96651 Presence of right artificial knee joint: Secondary | ICD-10-CM | POA: Diagnosis not present

## 2017-08-25 DIAGNOSIS — G8929 Other chronic pain: Secondary | ICD-10-CM | POA: Diagnosis not present

## 2017-08-25 DIAGNOSIS — M25511 Pain in right shoulder: Secondary | ICD-10-CM | POA: Diagnosis not present

## 2017-08-25 DIAGNOSIS — Z96652 Presence of left artificial knee joint: Secondary | ICD-10-CM | POA: Diagnosis not present

## 2017-08-25 MED ORDER — METHYLPREDNISOLONE ACETATE 40 MG/ML IJ SUSP
40.0000 mg | INTRAMUSCULAR | Status: AC | PRN
  Administered 2017-08-25: 40 mg via INTRA_ARTICULAR

## 2017-08-25 MED ORDER — LIDOCAINE HCL 1 % IJ SOLN
3.0000 mL | INTRAMUSCULAR | Status: AC | PRN
  Administered 2017-08-25: 3 mL

## 2017-08-25 MED ORDER — OXYCODONE HCL 5 MG PO TABS: 5.0000 mg | ORAL_TABLET | ORAL | 0 refills | Status: DC | PRN

## 2017-08-25 NOTE — Progress Notes (Signed)
Office Visit Note   Patient: Stephen Maldonado           Date of Birth: [birth year]           MRN: [id number] Visit Date: 08/25/2017              Requested by: Josetta Huddle, MD [address] [place], [place] [address] PCP: Josetta Huddle, MD   Assessment & Plan: Visit Diagnoses:  1. Chronic right shoulder pain   2. Status post total left knee replacement   3. Status post total right knee replacement   4. Arthritis of right shoulder region     Plan: At this point I have recommended both a subacromial steroid injection and eventually an intra-articular steroid injection under direct fluoroscopy.  He want to try at least a subacromial injection today.  We did this in the right shoulder without difficulty.  As far as his knees go we do not really see him back for a year.  At his one-year follow-up I would like an AP and lateral both knees.  If he ends up having issues with the right shoulder before then and would like [place] to set him up for an intra-articular glenohumeral joint injection of the right shoulder under fluoroscopy by Dr. Ernestina Patches he will let us know.  I did give him a one-time prescription for some oxycodone to take when the pain is severe.  Follow-Up Instructions: Return in about 1 year (around 08/26/2018).   Orders:  Orders Placed This Encounter  Procedures  . Large Joint Inj  . XR Shoulder Right   Meds ordered this encounter  Medications  . oxyCODONE (OXY IR/ROXICODONE) 5 MG immediate release tablet    Sig: Take 1-2 tablets (5-10 mg total) by mouth every 4 (four) hours as needed for severe pain ((score 7 to 10)).    Dispense:  40 tablet    Refill:  0      Procedures: Large Joint Inj: R subacromial bursa on 08/25/2017 1:26 PM Indications: pain and diagnostic evaluation Details: 22 G 1.5 in needle  Arthrogram: No  Medications: 3 mL lidocaine 1 %; 40 mg methylPREDNISolone acetate 40 MG/ML Outcome: tolerated well, no immediate complications Procedure,  treatment alternatives, risks and benefits explained, specific risks discussed. Consent was given by the patient. Immediately prior to procedure a time out was called to verify the correct patient, procedure, equipment, support staff and site/side marked as required. Patient was prepped and draped in the usual sterile fashion.       Clinical Data: No additional findings.   Subjective: No chief complaint on file. Patient is well-known to me.  He has a history of bilateral knee replacements.  I done these at different times.  He says that both knees are feeling fine.  We were going to x-ray him today but he says they have been doing so well I do not think we need x-rays today.  He also comes in with chief complaint of right shoulder pain.  He said shoulder pain and stiffness for very long period time.  He has not been able to reach behind with that right arm for a long period of time.  He says it does a lot of grinding and popping in his shoulder as well.  He is a very active individual.  He denies any specific injury to the right shoulder.  He denies any neck pain or numbness and tingling in his hand.  HPI  Review of Systems He  currently denies any headache, chest pain, shortness of breath, fever, chills, nausea, vomiting.  Objective: Vital Signs: There were no vitals taken for this visit.  Physical Exam Is alert and oriented x3 and in no acute distress Ortho Exam Examination of both knees show no effusion.  Both knees have full range of motion with well-healed surgical incisions.  Both calves are soft both knees feel ligamentously stable.  Examination of his right shoulder shows limitations with internal rotation and adduction.  He can only reach to below the belt level on the right side but he can reach behind him much higher on the left side.  He has a lot of grinding in the shoulder and positive Neer and Hawkins signs as well. Specialty Comments:  No specialty comments  available.  Imaging: Xr Shoulder Right  Result Date: 08/25/2017 3 views of the right shoulder show severe end-stage arthritis of the glenohumeral joint.  There is significant loss of the subacromial outlet as well on the outlet view.  There is significant AC joint arthritis.    PMFS History: Patient Active Problem List   Diagnosis Date Noted  . Arthritis of right shoulder region 08/25/2017  . Chronic right shoulder pain 08/25/2017  . Total knee replacement status, left 01/11/2017  . Status post total left knee replacement 01/10/2017  . Chronic pain of left knee 11/04/2016  . Chronic bilateral low back pain with right-sided sciatica 11/04/2016  . Acute coronary syndrome ([place]) 07/10/2016  . Unilateral primary osteoarthritis, left knee 06/26/2016  . H/O total knee replacement, right 06/26/2016  . Unilateral primary osteoarthritis, right knee 12/01/2015  . Status post total right knee replacement 12/01/2015   Past Medical History:  Diagnosis Date  . Arthritis    knees  . Bladder spasms   . BPH (benign prostatic hyperplasia)   . DVT (deep venous thrombosis) ([place]) ocyt 2016   left leg tx medically  . GERD (gastroesophageal reflux disease)   . History of colon polyps    hyperplastic 06-14-2010  . Hydronephrosis   . Hypertension   . Urinary retention     History reviewed. No pertinent family history.  Past Surgical History:  Procedure Laterality Date  . COLONOSCOPY  06/14/2010  . CORONARY ANGIOGRAPHY N/A 07/11/2016   Procedure: Coronary Angiography;  Surgeon: Dixie Dials, MD;  Location: [facility];  Service: Cardiovascular;  Laterality: N/A;  . CYSTOSCOPY W/ RETROGRADES Bilateral 10/03/2015   Procedure: CYSTOSCOPY WITH RETROGRADE PYELOGRAM;  Surgeon: [name], MD;  Location: [facility];  Service: Urology;  Laterality: Bilateral;  . GREEN LIGHT LASER TURP (TRANSURETHRAL RESECTION OF PROSTATE N/A 10/03/2015   Procedure: GREEN LIGHT LASER,  TURP -  STAGED(TRANSURETHRAL RESECTION OF PROSTATE;  Surgeon: [name], MD;  [facility];  Service: Urology;  Laterality: N/A;  . KNEE ARTHROSCOPY     not sure which knee  . QUADRICEPS TENDON REPAIR Bilateral left 02/24/2003/   right 2006  . TOTAL KNEE ARTHROPLASTY Right 12/01/2015   Procedure: RIGHT TOTAL KNEE ARTHROPLASTY;  Surgeon: [name], MD;  Location: [facility];  Service: Orthopedics;  Laterality: Right;  . TOTAL KNEE ARTHROPLASTY Left 01/10/2017   Procedure: LEFT TOTAL KNEE ARTHROPLASTY;  Surgeon: [name], MD;  Location: [facility];  Service: Orthopedics;  Laterality: Left;   Social History   Occupational History  . Not on file  Tobacco Use  . Smoking status: Never Smoker  . Smokeless tobacco: Former Systems developer    Types: Chew  Substance  and Sexual Activity  . Alcohol use: Yes    Comment: 1-2 beers days  . Drug use: No  . Sexual activity: Not Currently

## 2017-11-13 IMAGING — US US RENAL
1 series · 14 of 25 positions shown · non-contrast
Comparison: None.

CLINICAL DATA: Renal failure

EXAM:
RENAL / URINARY TRACT ULTRASOUND COMPLETE

[Series 1: us renal · 0.28mm/px · 14 of 34 slices shown]
[im 1/34]
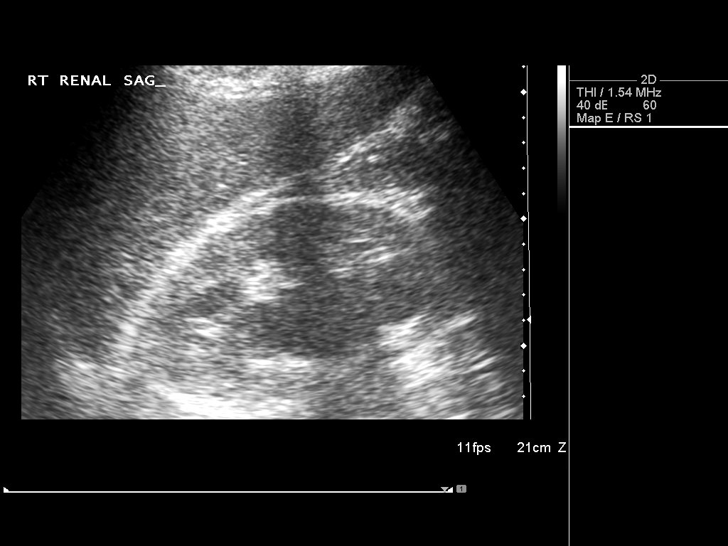
[im 3/34]
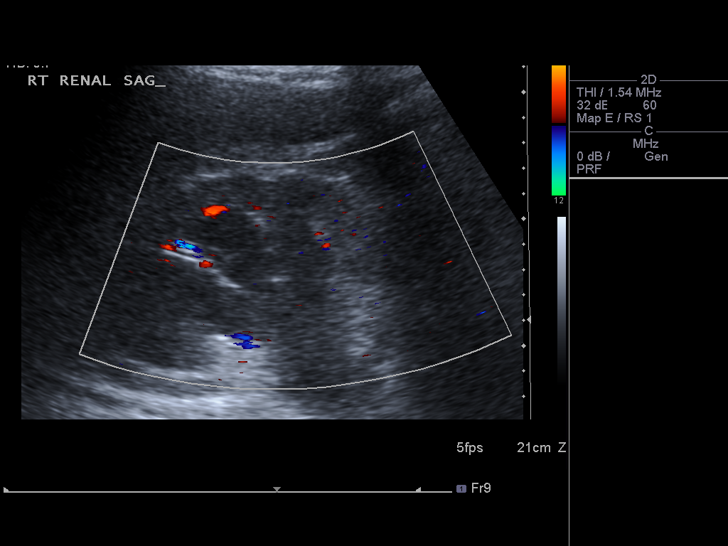
[im 6/34]
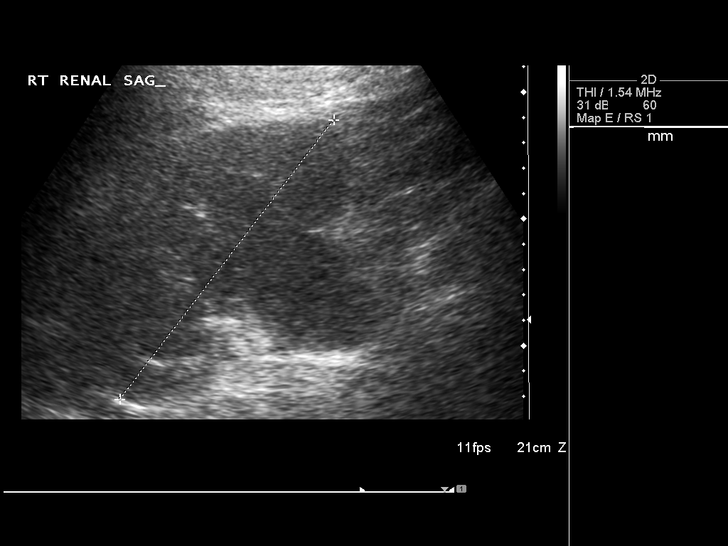
[im 9/34]
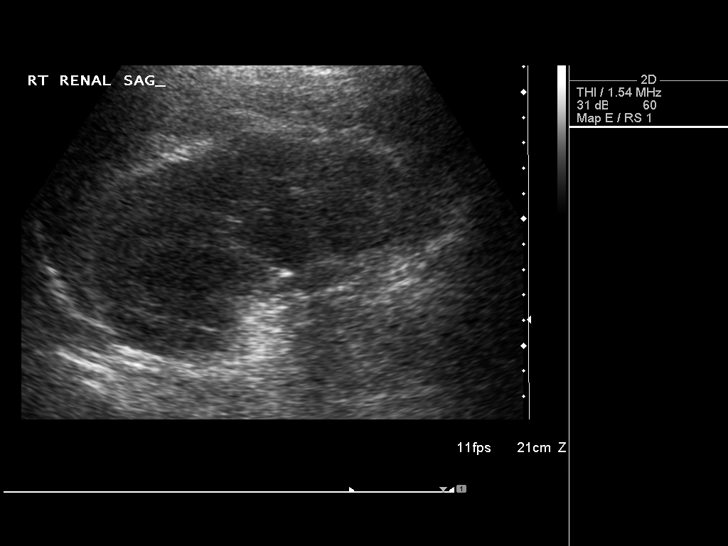
[im 12/34]
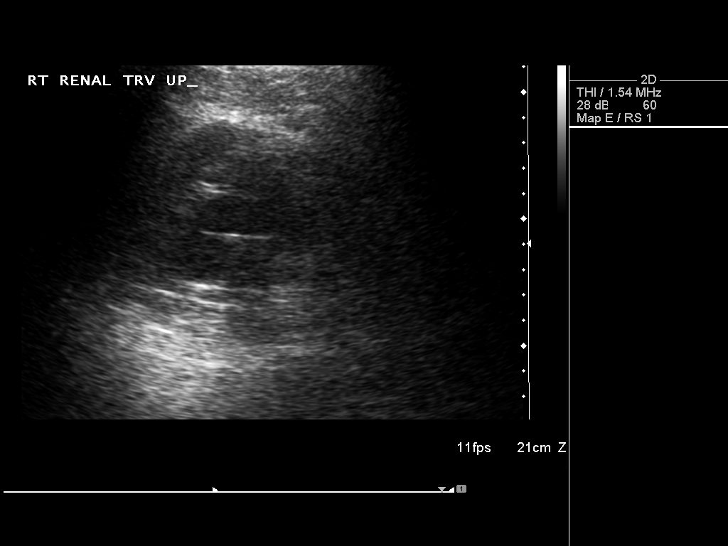
[im 13/34]
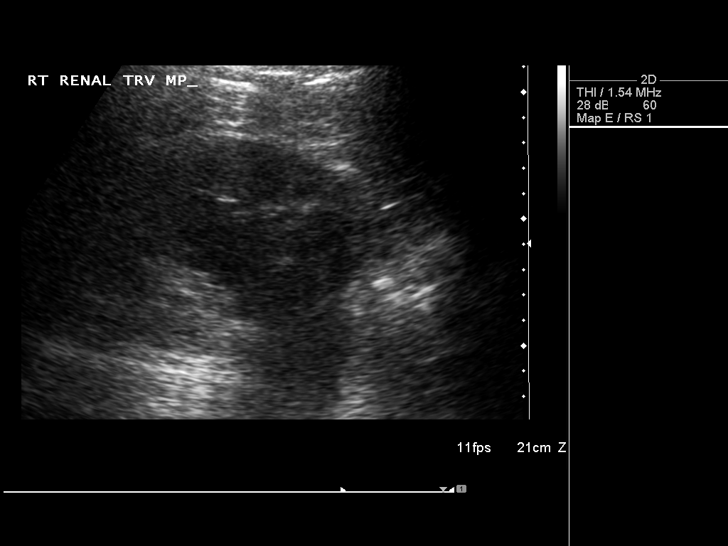
[im 16/34]
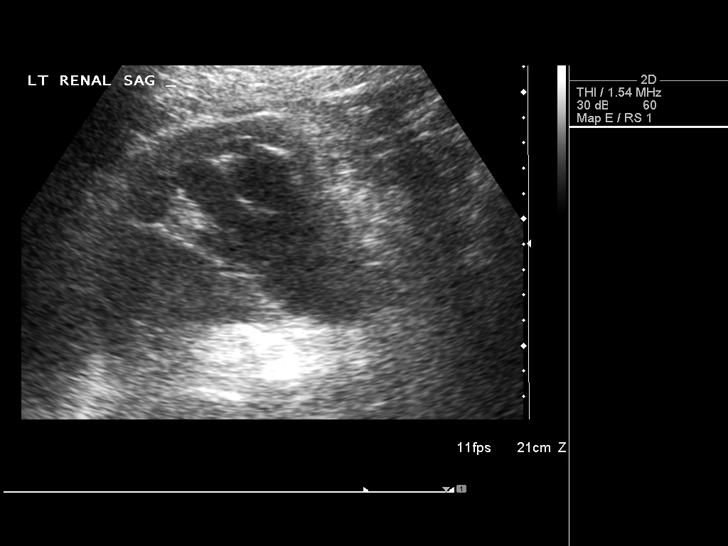
[im 18/34]
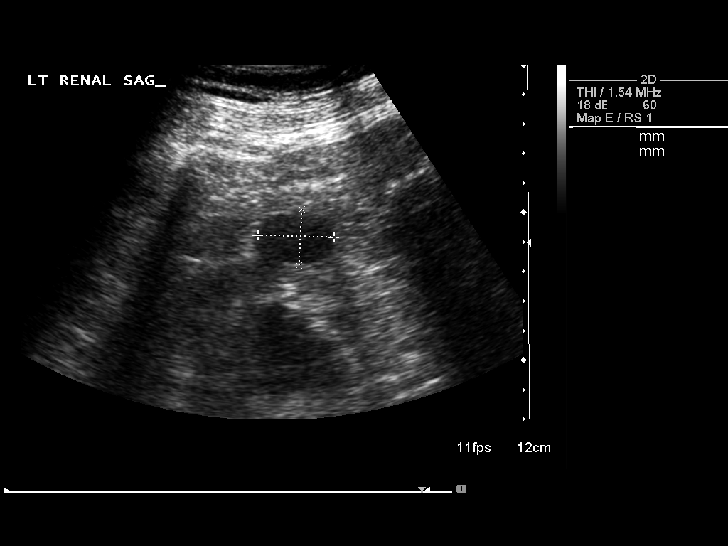
[im 21/34]
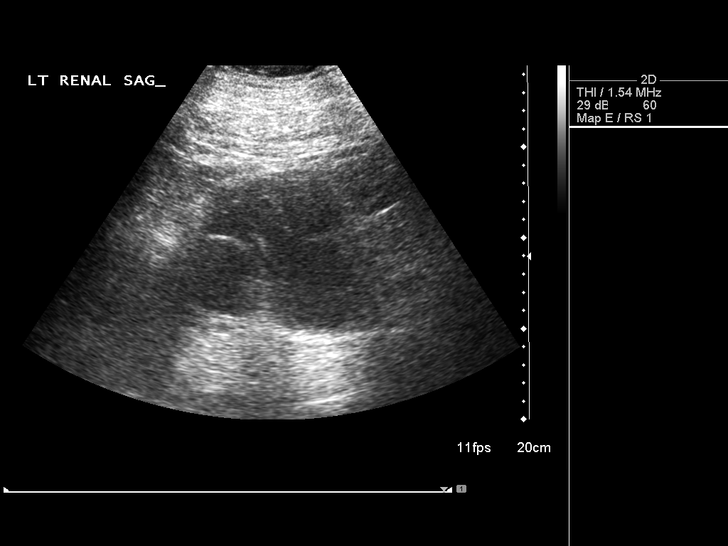
[im 23/34]
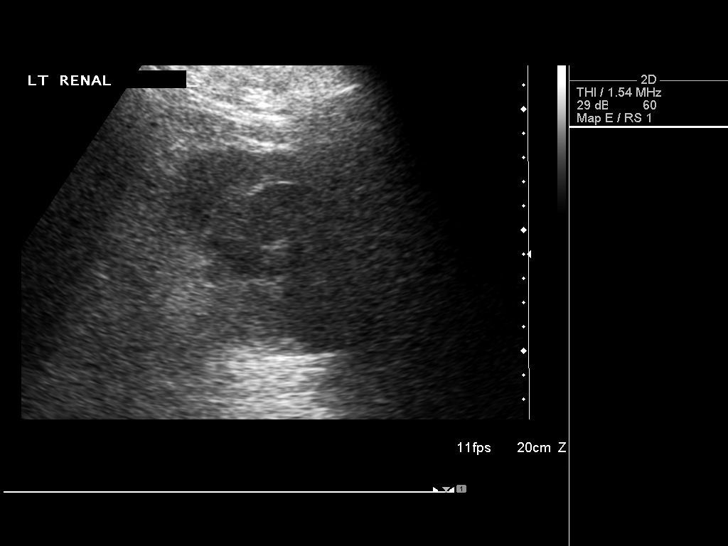
[im 25/34]
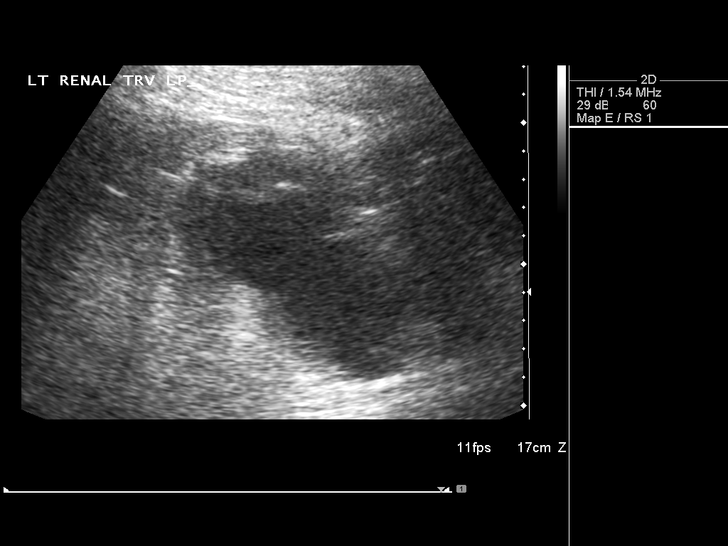
[im 28/34]
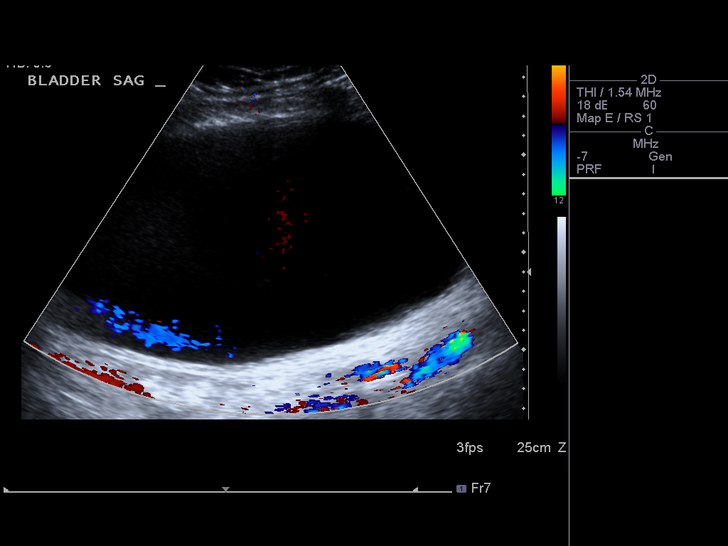
[im 31/34]
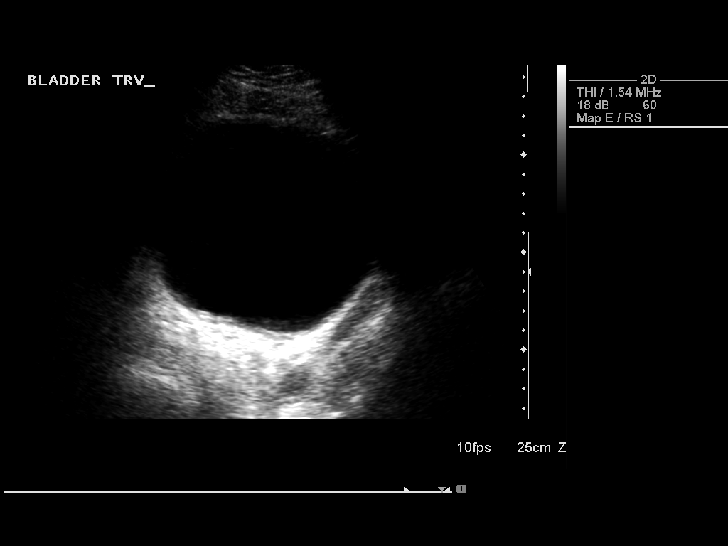
[im 34/34]
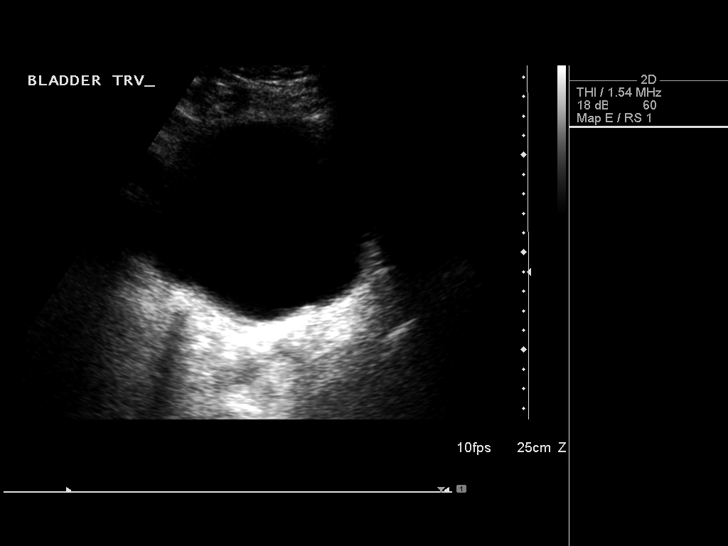

[14 of 25 positions shown; findings below may reference images not displayed]

FINDINGS: Right Kidney:

Length: 13.9 cm.  Moderate hydronephrosis with no stones or masses.

Left Kidney:

Length: 13.5 cm. Moderate hydronephrosis. 2.8 cm cyst in the left
kidney.

Bladder:

Appears normal for degree of bladder distention.
IMPRESSION: 1. Bilateral hydronephrosis. While the finding is age indeterminate,
in the acute setting, hydronephrosis could result in the patient's
renal failure.
These results will be called to the ordering clinician or
representative by the Radiologist Assistant, and communication
documented in the PACS or zVision Dashboard.

## 2017-11-20 ENCOUNTER — Telehealth (INDEPENDENT_AMBULATORY_CARE_PROVIDER_SITE_OTHER): Payer: Self-pay | Admitting: Orthopaedic Surgery

## 2017-11-20 ENCOUNTER — Other Ambulatory Visit (INDEPENDENT_AMBULATORY_CARE_PROVIDER_SITE_OTHER): Payer: Self-pay | Admitting: Orthopaedic Surgery

## 2017-11-20 MED ORDER — OXYCODONE-ACETAMINOPHEN 5-325 MG PO TABS: 1.0000 | ORAL_TABLET | Freq: Four times a day (QID) | ORAL | 0 refills | Status: DC | PRN

## 2017-11-20 NOTE — Telephone Encounter (Signed)
Patient called stating that he has some questions about his shoulder surgery and would like to speak to Dr. Ninfa Linden.  CB#[phone].  Thank you

## 2017-11-20 NOTE — Telephone Encounter (Signed)
I spoke with him and he needs a shoulder replacement. Get him an appointment with Dr. Marlou Sa next week to consider. Thanks.

## 2017-11-21 NOTE — Telephone Encounter (Signed)
I was able to reach patient and confirmed date and time for his appointment.

## 2017-11-21 NOTE — Telephone Encounter (Signed)
I tried calling patient. No answer. Will keep trying to reach him. Has VM that has not been set up Scheduled appt for 10/23 @ 915am

## 2017-11-21 NOTE — Telephone Encounter (Signed)
Possible to get him in next week?

## 2017-11-26 ENCOUNTER — Encounter (INDEPENDENT_AMBULATORY_CARE_PROVIDER_SITE_OTHER): Payer: Self-pay | Admitting: Orthopedic Surgery

## 2017-11-26 ENCOUNTER — Ambulatory Visit (INDEPENDENT_AMBULATORY_CARE_PROVIDER_SITE_OTHER): Payer: BLUE CROSS/BLUE SHIELD | Admitting: Orthopedic Surgery

## 2017-11-26 DIAGNOSIS — M19019 Primary osteoarthritis, unspecified shoulder: Secondary | ICD-10-CM

## 2017-11-26 NOTE — Progress Notes (Signed)
Office Visit Note   Patient: Stephen Maldonado           Date of Birth: [birth year]           MRN: [id number] Visit Date: 11/26/2017 Requested by: Josetta Huddle, MD [address] [place], [place] [address] PCP: Josetta Huddle, MD  Subjective: Chief Complaint  Patient presents with  . Right Shoulder - Pain    HPI: [name] is a patient with right shoulder pain.  Had intra-articular injection in July which helped some but it was short-lived relief.  Describes chronic pain.  He is right-hand dominant.  He reports grinding popping and pain in the shoulder.  Denies any history of recent injury.  Denies any neck pain or numbness and tingling.  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  Hard for him to wash under his left arm because of his right shoulder pain.  No history of diabetes but he did have some type of heart issue last year.  He cannot take aspirin because of kidney issues.  He does have night pain as well.              ROS: All systems reviewed are negative as they relate to the chief complaint within the history of present illness.  Patient denies  fevers or chills.   Assessment & Plan: Visit Diagnoses:  1. Shoulder arthritis     Plan: Impression is right shoulder arthritis with what appears to be well-functioning rotator cuff and no history of discrete injury.  Plan is thin cut CT right shoulder for preop total shoulder replacement.  I think at his age and activity level and anatomic shoulder replacement would be his best option.  If he has rotator cuff pathology then we will have the option intraoperatively to change to a reverse.  I would plan on using modular glenoid component in case he does develop rotator cuff problem so that we could change from anatomic to reverse shoulder replacement in the near future without too much difficulty.  The risk and benefits of surgery are discussed including but not limited to infection nerve vessel damage shoulder  instability as well as potential need for revision within 10 to 15 years.  Patient understands risk benefits.  All questions answered.  Follow-Up Instructions: No follow-ups on file.   Orders:  Orders Placed This Encounter  Procedures  . CT SHOULDER RIGHT WO CONTRAST   No orders of the defined types were placed in this encounter.     Procedures: No procedures performed   Clinical Data: No additional findings.  Objective: Vital Signs: There were no vitals taken for this visit.  Physical Exam:   Constitutional: Patient appears well-developed HEENT:  Head: Normocephalic Eyes:EOM are normal Neck: Normal range of motion Cardiovascular: Normal rate Pulmonary/chest: Effort normal Neurologic: Patient is alert Skin: Skin is warm Psychiatric: Patient has normal mood and affect    Ortho Exam: Ortho exam demonstrates full active and passive range of motion of that left arm.  On the right-hand side he has forward flexion and abduction both below 90 degrees.  External rotation of 15 degrees of abduction is about 30 degrees.  Has good rotator cuff strength infraspinatus supraspinatus and subscap muscle testing.  Does have pain coarseness and grinding with passive range of motion and the grinding is more bone-on-bone grinding and not soft tissue rotator cuff tearing type grinding.  Specialty Comments:  No specialty comments available.  Imaging: No results found.   PMFS History: Patient Active Problem List   Diagnosis Date Noted  . Arthritis of right shoulder region 08/25/2017  . Chronic right shoulder pain 08/25/2017  . Total knee replacement status, left 01/11/2017  . Status post total left knee replacement 01/10/2017  . Chronic pain of left knee 11/04/2016  . Chronic bilateral low back pain with right-sided sciatica 11/04/2016  . Acute coronary syndrome ([place]) 07/10/2016  . Unilateral primary osteoarthritis, left knee 06/26/2016  . H/O total knee replacement, right  06/26/2016  . Unilateral primary osteoarthritis, right knee 12/01/2015  . Status post total right knee replacement 12/01/2015   Past Medical History:  Diagnosis Date  . Arthritis    knees  . Bladder spasms   . BPH (benign prostatic hyperplasia)   . DVT (deep venous thrombosis) ([place]) ocyt 2016   left leg tx medically  . GERD (gastroesophageal reflux disease)   . History of colon polyps    hyperplastic 06-14-2010  . Hydronephrosis   . Hypertension   . Urinary retention     History reviewed. No pertinent family history.  Past Surgical History:  Procedure Laterality Date  . COLONOSCOPY  06/14/2010  . CORONARY ANGIOGRAPHY N/A 07/11/2016   Procedure: Coronary Angiography;  Surgeon: Dixie Dials, MD;  Location: [facility];  Service: Cardiovascular;  Laterality: N/A;  . CYSTOSCOPY W/ RETROGRADES Bilateral 10/03/2015   Procedure: CYSTOSCOPY WITH RETROGRADE PYELOGRAM;  Surgeon: [name], MD;  Location[facility];  Service: Urology;  Laterality: Bilateral;  . GREEN LIGHT LASER TURP (TRANSURETHRAL RESECTION OF PROSTATE N/A 10/03/2015   Procedure: GREEN LIGHT LASER,  TURP - STAGED(TRANSURETHRAL RESECTION OF PROSTATE;  Surgeon: [name], MD;  Location: [facility];  Service: Urology;  Laterality: N/A;  . KNEE ARTHROSCOPY     not sure which knee  . QUADRICEPS TENDON REPAIR Bilateral left 02/24/2003/   right 2006  . TOTAL KNEE ARTHROPLASTY Right 12/01/2015   Procedure: RIGHT TOTAL KNEE ARTHROPLASTY;  Surgeon: [name], MD;  Location: [facility];  Service: Orthopedics;  Laterality: Right;  . TOTAL KNEE ARTHROPLASTY Left 01/10/2017   Procedure: LEFT TOTAL KNEE ARTHROPLASTY;  Surgeon: [name], MD;  Location: [facility];  Service: Orthopedics;  Laterality: Left;   Social History   Occupational History  . Not on file  Tobacco Use  . Smoking status: Never Smoker  . Smokeless tobacco: Former Systems developer    Types: Chew  Substance  and Sexual Activity  . Alcohol use: Yes    Comment: 1-2 beers days  . Drug use: No  . Sexual activity: Not Currently

## 2017-12-02 ENCOUNTER — Ambulatory Visit
Admission: RE | Admit: 2017-12-02 | Discharge: 2017-12-02 | Disposition: A | Discharge status: Home / Self Care | Payer: BLUE CROSS/BLUE SHIELD | Source: Ambulatory Visit | Attending: Orthopedic Surgery | Admitting: Orthopedic Surgery

## 2017-12-02 DIAGNOSIS — M19019 Primary osteoarthritis, unspecified shoulder: Secondary | ICD-10-CM

## 2017-12-02 DIAGNOSIS — M19011 Primary osteoarthritis, right shoulder: Secondary | ICD-10-CM | POA: Diagnosis not present

## 2017-12-10 ENCOUNTER — Ambulatory Visit (INDEPENDENT_AMBULATORY_CARE_PROVIDER_SITE_OTHER): Payer: BLUE CROSS/BLUE SHIELD | Admitting: Orthopedic Surgery

## 2017-12-10 ENCOUNTER — Encounter (INDEPENDENT_AMBULATORY_CARE_PROVIDER_SITE_OTHER): Payer: Self-pay | Admitting: Orthopedic Surgery

## 2017-12-10 DIAGNOSIS — M19019 Primary osteoarthritis, unspecified shoulder: Secondary | ICD-10-CM | POA: Diagnosis not present

## 2017-12-10 MED ORDER — OXYCODONE HCL 5 MG PO TABS: ORAL_TABLET | ORAL | 0 refills | Status: DC

## 2017-12-10 NOTE — Progress Notes (Signed)
Office Visit Note   Patient: Stephen Maldonado           Date of Birth: [birth year]           MRN: [id number] Visit Date: 12/10/2017 Requested by: Josetta Huddle, MD [address] [place], [place] [address] PCP: Josetta Huddle, MD  Subjective: Chief Complaint  Patient presents with  . Right Shoulder - Follow-up    HPI: [name] is a patient with right shoulder pain.  He is having pain that wakes him from sleep.  It is getting worse.  Since of seen him he had a CT scan in preparation for patient specific instrumentation.  Taking oxycodone occasionally for pain to help him sleep.  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  CT scan does show significant arthritis in the glenohumeral joint but also some subscapularis atrophy.              ROS: All systems reviewed are negative as they relate to the chief complaint within the history of present illness.  Patient denies  fevers or chills.   Assessment & Plan: Visit Diagnoses:  1. Shoulder arthritis     Plan: Impression is right shoulder pain with arthritis.  Subscapularis does appear to be deficient both clinically as well as on CT scan.  I think what this means for [name] is that he is not a candidate for total shoulder replacement.  I think he is a candidate although on the young side for reverse shoulder replacement.  It is explained at length to him the risk and benefits of shoulder replacement surgery particularly reverse shoulder replacement surgery in someone his age.  The risk include infection instability incomplete pain relief as well as limitation of function.  Also importantly for his work on the farm I would not recommend heavy type of lifting and strenuous activity that is typically required on a farm.  This could shorten the life span of the implant.  To that end I would definitely recommend patient specific instrumentation for optimal implant positioning on the glenoid side.  Patient understands the risk  benefits and basically says he cannot go on the way he is with his shoulder pain.  Patient understands potential activity limitations and how not adhering to those could shorten the life span of the prosthesis.  All questions answered.  Follow-Up Instructions: No follow-ups on file.   Orders:  No orders of the defined types were placed in this encounter.  Meds ordered this encounter  Medications  . oxyCODONE (OXY IR/ROXICODONE) 5 MG immediate release tablet    Sig: 1 po q d prn pain    Dispense:  30 tablet    Refill:  0      Procedures: No procedures performed   Clinical Data: No additional findings.  Objective: Vital Signs: There were no vitals taken for this visit.  Physical Exam:   Constitutional: Patient appears well-developed HEENT:  Head: Normocephalic Eyes:EOM are normal Neck: Normal range of motion Cardiovascular: Normal rate Pulmonary/chest: Effort normal Neurologic: Patient is alert Skin: Skin is warm Psychiatric: Patient has normal mood and affect    Ortho Exam: Ortho exam demonstrates some flexion and abduction just past 90 degrees but it is painful.  He does have subscap weakness on the right 3- out of 5 compared to the left 5+ out of 5.  Supraspinatus infraspinatus strength is actually pretty reasonable on the right-hand side.  Range of motion is about 30 degrees  of external rotation on the right compared to 45 on the left.  Skin is intact in the shoulder girdle region and deltoid is functional.  Specialty Comments:  No specialty comments available.  Imaging: No results found.   PMFS History: Patient Active Problem List   Diagnosis Date Noted  . Arthritis of right shoulder region 08/25/2017  . Chronic right shoulder pain 08/25/2017  . Total knee replacement status, left 01/11/2017  . Status post total left knee replacement 01/10/2017  . Chronic pain of left knee 11/04/2016  . Chronic bilateral low back pain with right-sided sciatica 11/04/2016    . Acute coronary syndrome ([place]) 07/10/2016  . Unilateral primary osteoarthritis, left knee 06/26/2016  . H/O total knee replacement, right 06/26/2016  . Unilateral primary osteoarthritis, right knee 12/01/2015  . Status post total right knee replacement 12/01/2015   Past Medical History:  Diagnosis Date  . Arthritis    knees  . Bladder spasms   . BPH (benign prostatic hyperplasia)   . DVT (deep venous thrombosis) ([place]) ocyt 2016   left leg tx medically  . GERD (gastroesophageal reflux disease)   . History of colon polyps    hyperplastic 06-14-2010  . Hydronephrosis   . Hypertension   . Urinary retention     History reviewed. No pertinent family history.  Past Surgical History:  Procedure Laterality Date  . COLONOSCOPY  06/14/2010  . CORONARY ANGIOGRAPHY N/A 07/11/2016   Procedure: Coronary Angiography;  Surgeon: Dixie Dials, MD;  Location: [facility];  Service: Cardiovascular;  Laterality: N/A;  . CYSTOSCOPY W/ RETROGRADES Bilateral 10/03/2015   Procedure: CYSTOSCOPY WITH RETROGRADE PYELOGRAM;  Surgeon: [name], MD;  Location[facility];  Service: Urology;  Laterality: Bilateral;  . GREEN LIGHT LASER TURP (TRANSURETHRAL RESECTION OF PROSTATE N/A 10/03/2015   Procedure: GREEN LIGHT LASER,  TURP - STAGED(TRANSURETHRAL RESECTION OF PROSTATE;  Surgeon: [name], MD;  Location: [facility];  Service: Urology;  Laterality: N/A;  . KNEE ARTHROSCOPY     not sure which knee  . QUADRICEPS TENDON REPAIR Bilateral left 02/24/2003/   right 2006  . TOTAL KNEE ARTHROPLASTY Right 12/01/2015   Procedure: RIGHT TOTAL KNEE ARTHROPLASTY;  Surgeon: [name], MD;  Location: [facility];  Service: Orthopedics;  Laterality: Right;  . TOTAL KNEE ARTHROPLASTY Left 01/10/2017   Procedure: LEFT TOTAL KNEE ARTHROPLASTY;  Surgeon: [name], MD;  Location: [facility];  Service: Orthopedics;  Laterality: Left;   Social History    Occupational History  . Not on file  Tobacco Use  . Smoking status: Never Smoker  . Smokeless tobacco: Former Systems developer    Types: Chew  Substance and Sexual Activity  . Alcohol use: Yes    Comment: 1-2 beers days  . Drug use: No  . Sexual activity: Not Currently

## 2017-12-19 IMAGING — RF DG RETROGRADE PYELOGRAM
1 series · 15 of 15 positions shown · non-contrast
Comparison: None.

CLINICAL DATA: Urinary root

EXAM:
INTRAOPERATIVE BILATERAL RETROGRADE UROGRAPHY
TECHNIQUE: Images were obtained with the C-arm fluoroscopic device
intraoperatively and submitted for interpretation post-operatively.
Please see the procedural report for the amount of contrast and the
fluoroscopy time utilized.

[Series 1: run · 15 of 15 slices shown]
[im 1/15]
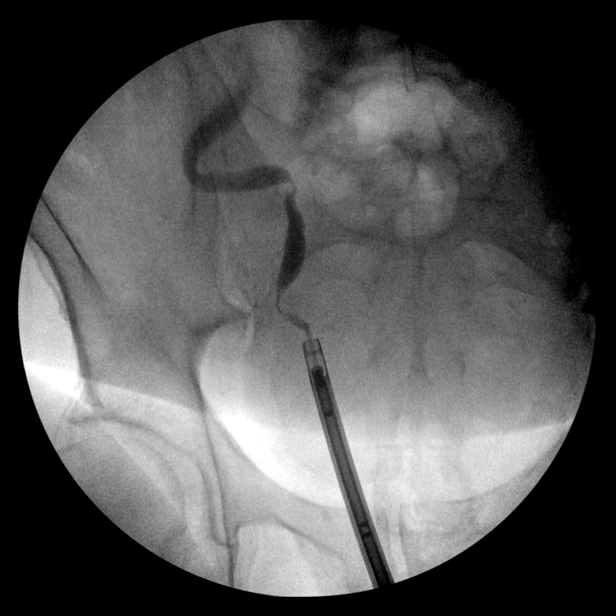
[im 2/15]
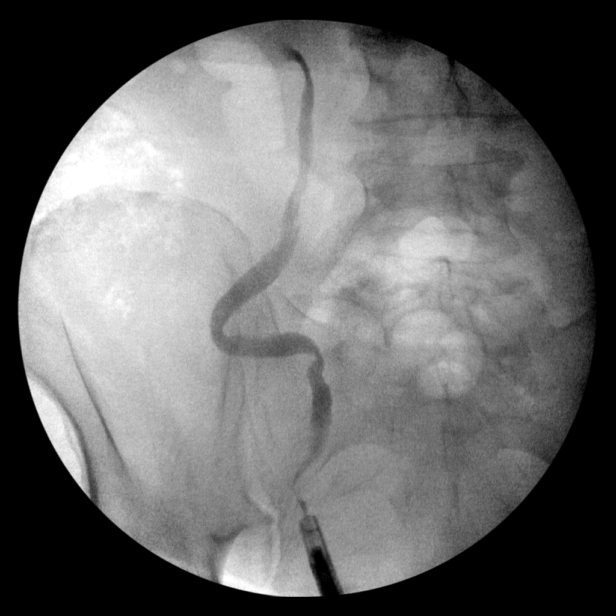
[im 3/15]
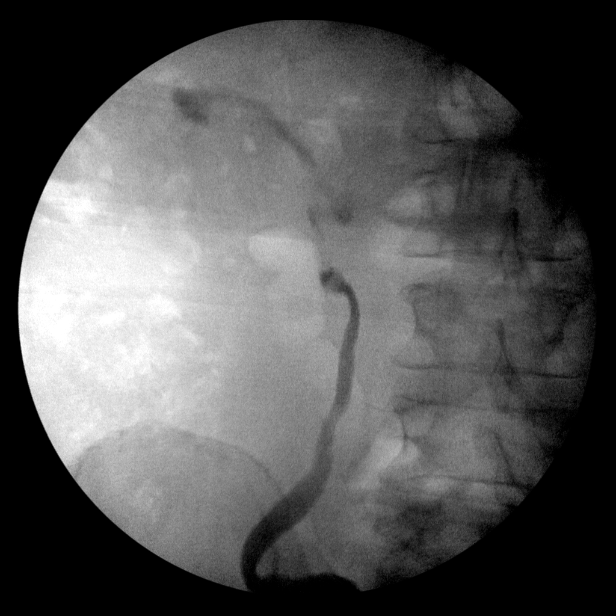
[im 4/15]
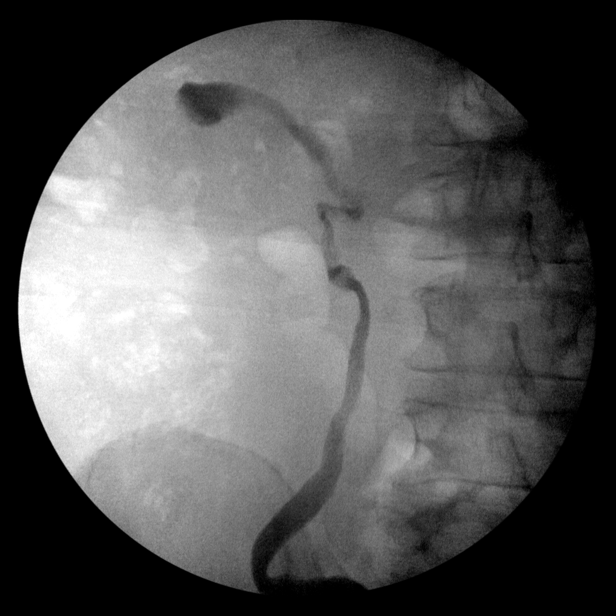
[im 5/15]
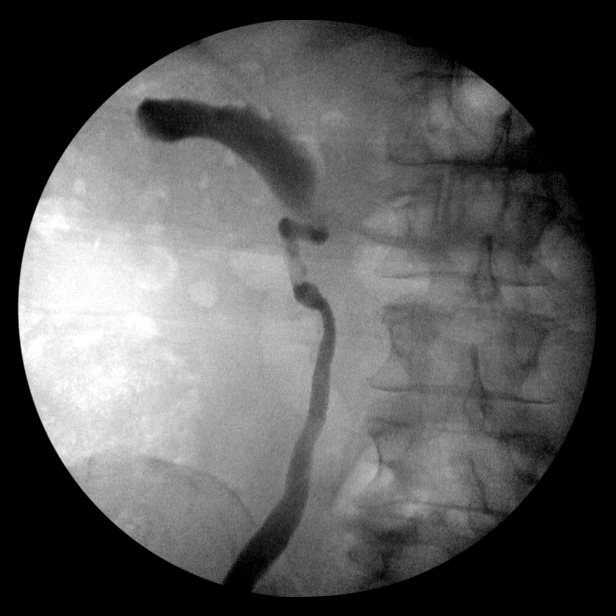
[im 6/15]
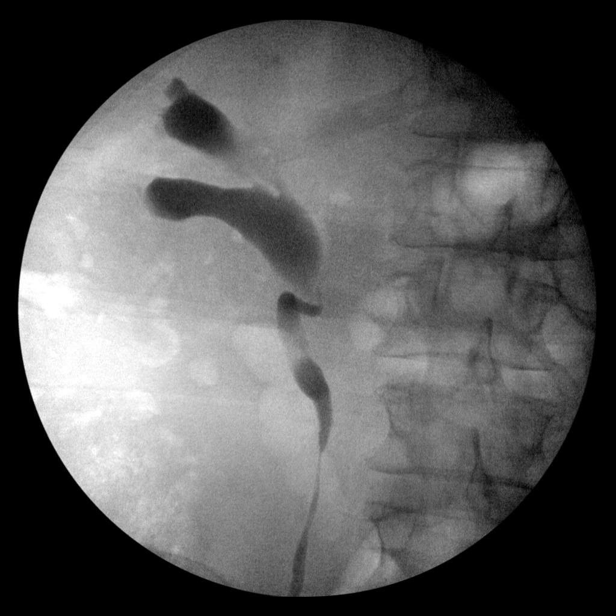
[im 7/15]
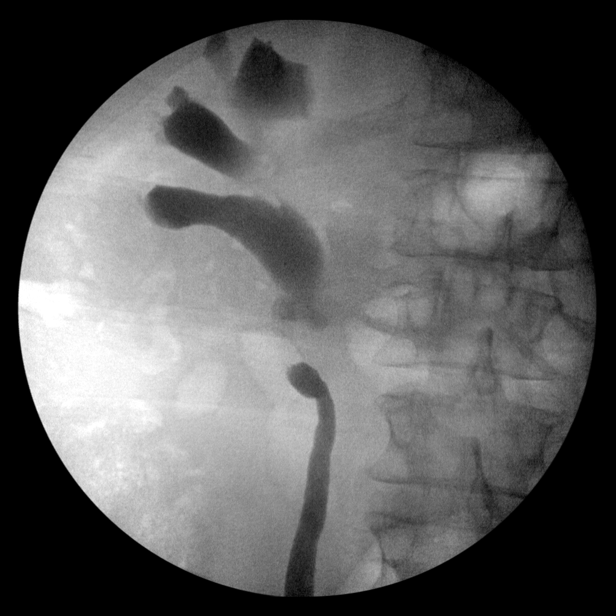
[im 8/15]
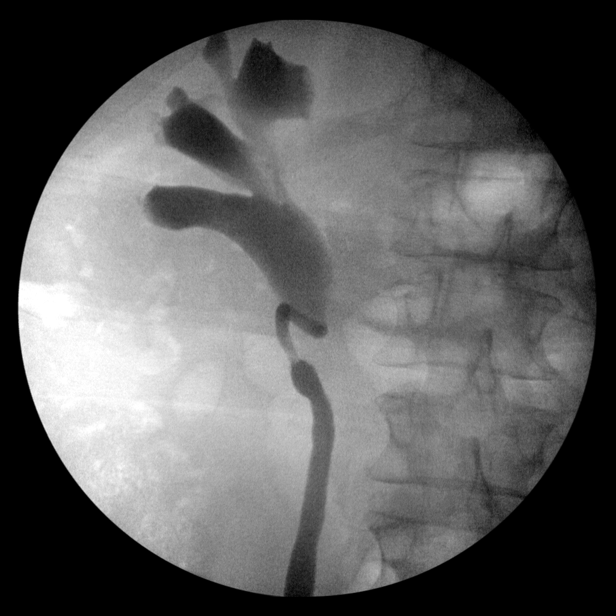
[im 9/15]
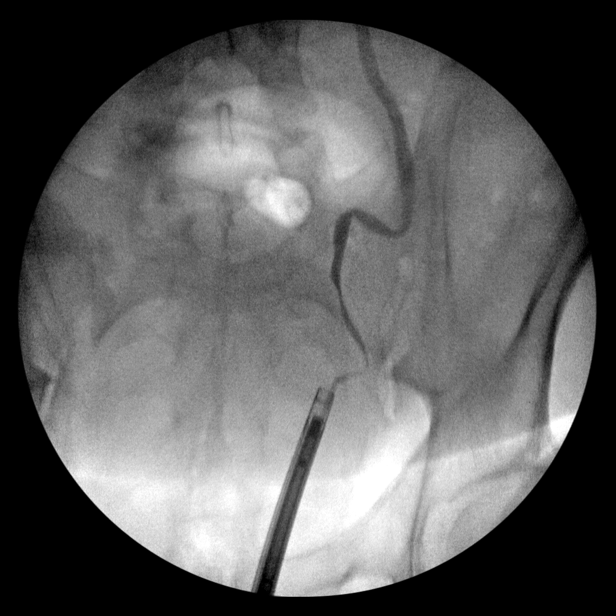
[im 10/15]
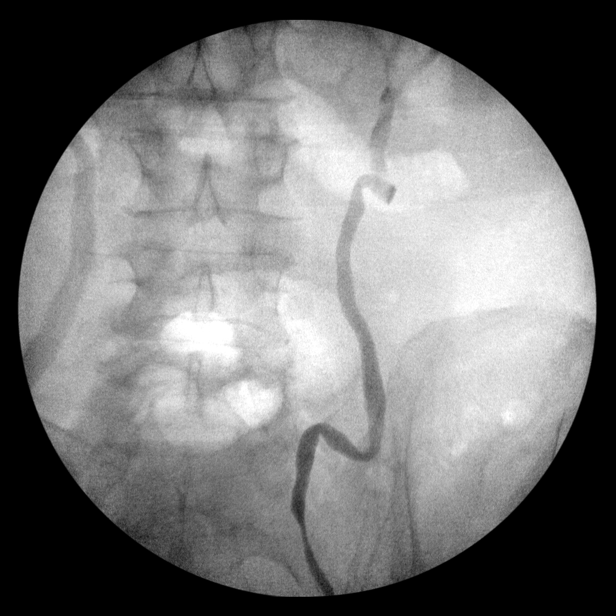
[im 11/15]
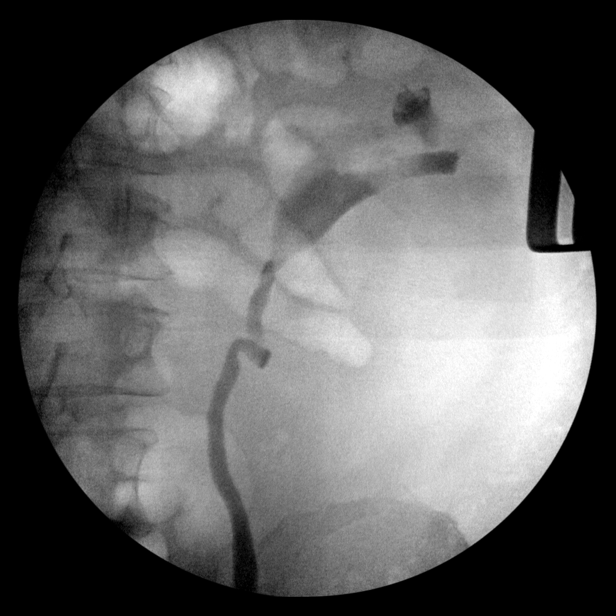
[im 12/15]
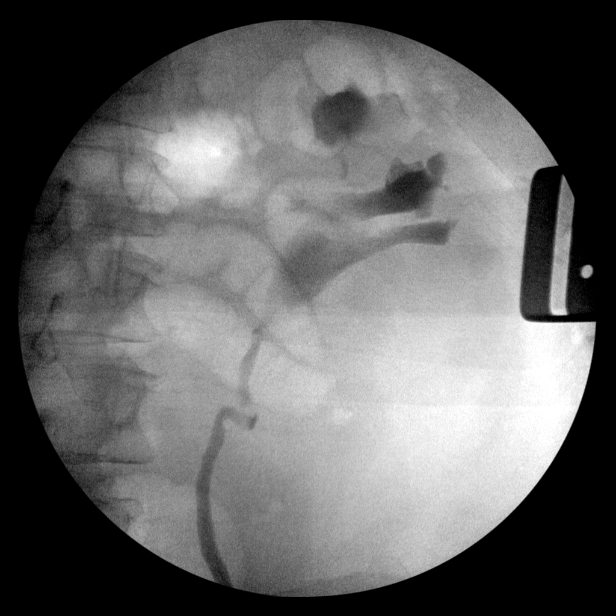
[im 13/15]
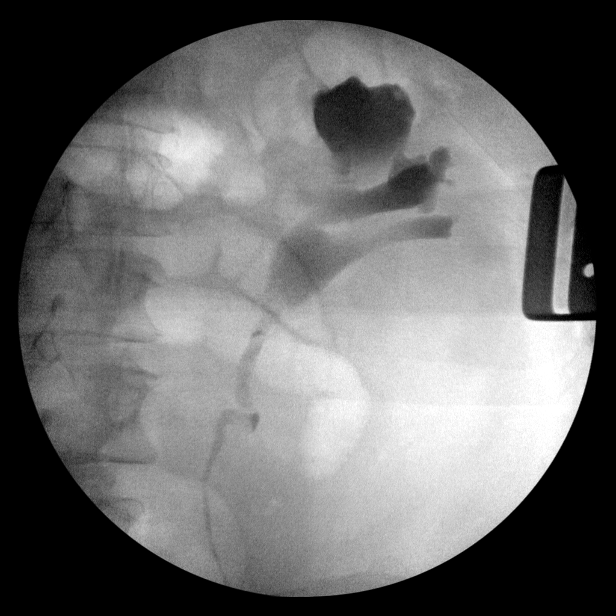
[im 14/15]
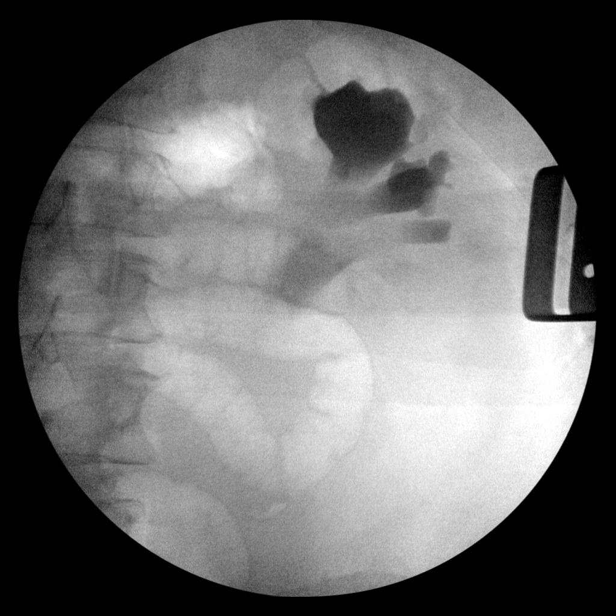
[im 15/15]
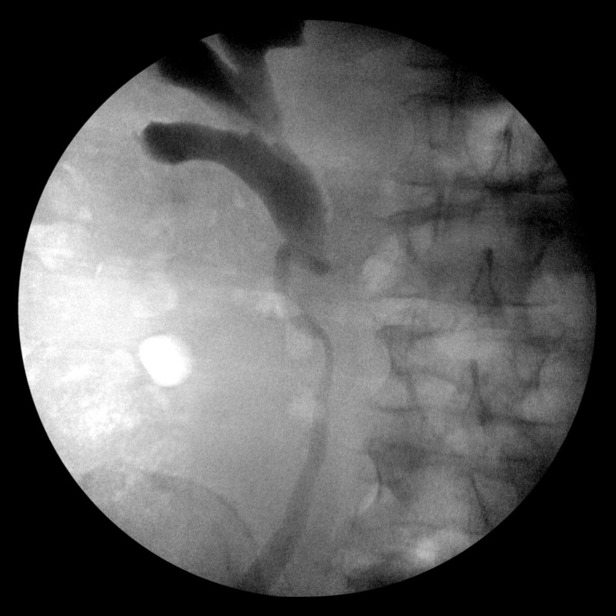

[15 of 15 positions shown; findings below may reference images not displayed]

FINDINGS: Bilateral hydronephrosis is present. Calices are blunted. Ureters
are tortuous bilaterally. Contrast iodinated contrast extends into
the papillary with a ball on Galaz configuration, particularly in the
left lower pole. These findings raise the possibility of papillary
necrosis.
IMPRESSION: Possible papular necrosis and a bilateral hydronephrosis.

## 2018-01-13 ENCOUNTER — Other Ambulatory Visit (INDEPENDENT_AMBULATORY_CARE_PROVIDER_SITE_OTHER): Payer: Self-pay | Admitting: Orthopedic Surgery

## 2018-01-13 DIAGNOSIS — M19011 Primary osteoarthritis, right shoulder: Secondary | ICD-10-CM

## 2018-01-14 NOTE — Pre-Procedure Instructions (Signed)
Stephen Maldonado  01/14/2018      [id number] - [place], [name] [address] Phone: [phone] Fax: [phone]    Your procedure is scheduled on Decmeber 20th.  Report to [facility] Admitting at [facility]  Call this number if you have problems the morning of surgery:  [phone]   Remember:  Do not eat or drink after midnight.    Take these medicines the morning of surgery with A SIP OF WATER  diltiazem (CARDIZEM CD) docusate sodium (COLACE) hydrALAZINE (APRESOLINE metoprolol tartrate (LOPRESSOR) oxyCODONE (OXY IR/ROXICODONE) If needed simvastatin (ZOCOR) if required tamsulosin (FLOMAX)  tiZANidine (ZANAFLEX) If needed  Follow your surgeon's instructions on when to stop rivaroxaban (XARELTO).  If no instructions were given by your surgeon then you will need to call the office to get those instructions.     7 days prior to surgery STOP taking any Aspirin(unless otherwise instructed by your surgeon), Aleve, Naproxen, Ibuprofen, Motrin, Advil, Goody's, BC's, all herbal medications, fish oil, and all vitamins     Do not wear jewelry.  Do not wear lotions, powders, or colognes, or deodorant.  Men may shave face and neck.  Do not bring valuables to the hospital.  [facility] is not responsible for any belongings or valuables.  Contacts, dentures or bridgework may not be worn into surgery.  Leave your suitcase in the car.  After surgery it may be brought to your room.  For patients admitted to the hospital, discharge time will be determined by your treatment team.  Patients discharged the day of surgery will not be allowed to drive home.    [place]- Preparing For Surgery  Before surgery, you can play an important role. Because skin is not sterile, your skin needs to be as free of germs as possible. You can reduce the number of germs on your skin by washing with CHG (chlorahexidine gluconate)  Soap before surgery.  CHG is an antiseptic cleaner which kills germs and bonds with the skin to continue killing germs even after washing.    Oral Hygiene is also important to reduce your risk of infection.  Remember - BRUSH YOUR TEETH THE MORNING OF SURGERY WITH YOUR REGULAR TOOTHPASTE  Please do not use if you have an allergy to CHG or antibacterial soaps. If your skin becomes reddened/irritated stop using the CHG.  Do not shave (including legs and underarms) for at least 48 hours prior to first CHG shower. It is OK to shave your face.  Please follow these instructions carefully.   1. Shower the NIGHT BEFORE SURGERY and the MORNING OF SURGERY with CHG.   2. If you chose to wash your hair, wash your hair first as usual with your normal shampoo.  3. After you shampoo, rinse your hair and body thoroughly to remove the shampoo.  4. Use CHG as you would any other liquid soap. You can apply CHG directly to the skin and wash gently with a scrungie or a clean washcloth.   5. Apply the CHG Soap to your body ONLY FROM THE NECK DOWN.  Do not use on open wounds or open sores. Avoid contact with your eyes, ears, mouth and genitals (private parts). Wash Face and genitals (private parts)  with your normal soap.  6. Wash thoroughly, paying special attention to the area where your surgery will be performed.  7. Thoroughly rinse your body with warm water from the neck  down.  8. DO NOT shower/wash with your normal soap after using and rinsing off the CHG Soap.  9. Pat yourself dry with a CLEAN TOWEL.  10. Wear CLEAN PAJAMAS to bed the night before surgery, wear comfortable clothes the morning of surgery  11. Place CLEAN SHEETS on your bed the night of your first shower and DO NOT SLEEP WITH PETS.    Day of Surgery:  Do not apply any deodorants/lotions.  Please wear clean clothes to the hospital/surgery center.   Remember to brush your teeth WITH YOUR REGULAR TOOTHPASTE.    Please read over  the following fact sheets that you were given.

## 2018-01-15 ENCOUNTER — Encounter (HOSPITAL_COMMUNITY): Payer: Self-pay

## 2018-01-15 ENCOUNTER — Encounter (HOSPITAL_COMMUNITY)
Admission: RE | Admit: 2018-01-15 | Discharge: 2018-01-15 | Disposition: A | Discharge status: Home / Self Care | Payer: BLUE CROSS/BLUE SHIELD | Source: Ambulatory Visit | Attending: Orthopedic Surgery | Admitting: Orthopedic Surgery

## 2018-01-15 ENCOUNTER — Other Ambulatory Visit: Payer: Self-pay

## 2018-01-15 DIAGNOSIS — R001 Bradycardia, unspecified: Secondary | ICD-10-CM | POA: Insufficient documentation

## 2018-01-15 DIAGNOSIS — I10 Essential (primary) hypertension: Secondary | ICD-10-CM | POA: Insufficient documentation

## 2018-01-15 DIAGNOSIS — Z01818 Encounter for other preprocedural examination: Secondary | ICD-10-CM | POA: Insufficient documentation

## 2018-01-15 LAB — BASIC METABOLIC PANEL
ANION GAP: 14 (ref 5–15)
BUN: 22 mg/dL (ref 8–23)
CALCIUM: 9.4 mg/dL (ref 8.9–10.3)
CO2: 24 mmol/L (ref 22–32)
Chloride: 102 mmol/L (ref 98–111)
Creatinine, Ser: 1.95 mg/dL — ABNORMAL HIGH (ref 0.61–1.24)
GFR calc Af Amer: 41 mL/min — ABNORMAL LOW (ref >60)
GFR calc non Af Amer: 36 mL/min — ABNORMAL LOW (ref >60)
Glucose, Bld: 98 mg/dL (ref 70–99)
POTASSIUM: 4.4 mmol/L (ref 3.5–5.1)
Sodium: 140 mmol/L (ref 135–145)

## 2018-01-15 LAB — URINALYSIS, ROUTINE W REFLEX MICROSCOPIC
Bacteria, UA: NONE SEEN
Bilirubin Urine: NEGATIVE
GLUCOSE, UA: NEGATIVE mg/dL
Hgb urine dipstick: NEGATIVE
Ketones, ur: NEGATIVE mg/dL
Leukocytes, UA: NEGATIVE
Nitrite: NEGATIVE
Protein, ur: 30 mg/dL — AB
Specific Gravity, Urine: 1.008 (ref 1.005–1.030)
pH: 5 (ref 5.0–8.0)

## 2018-01-15 LAB — CBC
HCT: 49.5 % (ref 39.0–52.0)
Hemoglobin: 15.1 g/dL (ref 13.0–17.0)
MCH: 28.1 pg (ref 26.0–34.0)
MCHC: 30.5 g/dL (ref 30.0–36.0)
MCV: 92 fL (ref 80.0–100.0)
Platelets: 219 10*3/uL (ref 150–400)
RBC: 5.38 MIL/uL (ref 4.22–5.81)
RDW: 14.1 % (ref 11.5–15.5)
WBC: 7.8 10*3/uL (ref 4.0–10.5)
nRBC: 0 % (ref 0.0–0.2)

## 2018-01-15 LAB — SURGICAL PCR SCREEN
MRSA, PCR: NEGATIVE
STAPHYLOCOCCUS AUREUS: NEGATIVE

## 2018-01-15 NOTE — Pre-Procedure Instructions (Signed)
Stephen Maldonado  01/15/2018      [id number] - [place], [name] [address] Phone: [phone] Fax: [phone]    Your procedure is scheduled on Decmeber 20th.  Report to [facility] Admitting at 5:30 A.M.  Call this number if you have problems the morning of surgery:  [phone]   Remember:  Do not eat or drink after midnight.    Take these medicines the morning of surgery with A SIP OF WATER    Diltiazem (CARDIZEM CD)  Docusate sodium (COLACE)  HydrALAZINE (APRESOLINE  Metoprolol tartrate (LOPRESSOR)  OxyCODONE (OXY IR/ROXICODONE) - If needed  Simvastatin (ZOCOR) if required  Tamsulosin (FLOMAX)   TiZANidine (ZANAFLEX) - If needed  Follow your surgeon's instructions on when to stop rivaroxaban (XARELTO).  If no instructions were given by your surgeon then you will need to call the office to get those instructions.     7 days prior to surgery STOP taking any Aspirin(unless otherwise instructed by your surgeon), Aleve, Naproxen, Ibuprofen, Motrin, Advil, Goody's, BC's, all herbal medications, fish oil, and all vitamins     Do not wear jewelry.  Do not wear lotions, powders, or colognes, or deodorant.  Men may shave face and neck.  Do not bring valuables to the hospital.  [facility] is not responsible for any belongings or valuables.  Contacts, dentures or bridgework may not be worn into surgery.  Leave your suitcase in the car.  After surgery it may be brought to your room.  For patients admitted to the hospital, discharge time will be determined by your treatment team.  Patients discharged the day of surgery will not be allowed to drive home.    [place]- Preparing For Surgery  Before surgery, you can play an important role. Because skin is not sterile, your skin needs to be as free of germs as possible. You can reduce the number of germs on your skin by washing with CHG  (chlorahexidine gluconate) Soap before surgery.  CHG is an antiseptic cleaner which kills germs and bonds with the skin to continue killing germs even after washing.    Oral Hygiene is also important to reduce your risk of infection.  Remember - BRUSH YOUR TEETH THE MORNING OF SURGERY WITH YOUR REGULAR TOOTHPASTE  Please do not use if you have an allergy to CHG or antibacterial soaps. If your skin becomes reddened/irritated stop using the CHG.  Do not shave (including legs and underarms) for at least 48 hours prior to first CHG shower. It is OK to shave your face.  Please follow these instructions carefully.   1. Shower the NIGHT BEFORE SURGERY and the MORNING OF SURGERY with CHG.   2. If you chose to wash your hair, wash your hair first as usual with your normal shampoo.  3. After you shampoo, rinse your hair and body thoroughly to remove the shampoo.  4. Use CHG as you would any other liquid soap. You can apply CHG directly to the skin and wash gently with a scrungie or a clean washcloth.   5. Apply the CHG Soap to your body ONLY FROM THE NECK DOWN.  Do not use on open wounds or open sores. Avoid contact with your eyes, ears, mouth and genitals (private parts). Wash Face and genitals (private parts)  with your normal soap.  6. Wash thoroughly, paying special attention to the area where your surgery will be performed.  7. Thoroughly rinse your body with warm water from the neck down.  8. DO NOT shower/wash with your normal soap after using and rinsing off the CHG Soap.  9. Pat yourself dry with a CLEAN TOWEL.  10. Wear CLEAN PAJAMAS to bed the night before surgery, wear comfortable clothes the morning of surgery  11. Place CLEAN SHEETS on your bed the night of your first shower and DO NOT SLEEP WITH PETS.    Day of Surgery:  Do not apply any deodorants/lotions.  Please wear clean clothes to the hospital/surgery center.   Remember to brush your teeth WITH YOUR REGULAR  TOOTHPASTE.    Please read over the following fact sheets that you were given.

## 2018-01-15 NOTE — Progress Notes (Signed)
PCP: Josetta Huddle  DM: denies  SA: denies  Pt denies SOB, cough, chest pain, fever  Pt stated understanding of instructions given for DOS.

## 2018-01-16 LAB — URINE CULTURE: Culture: NO GROWTH

## 2018-01-16 NOTE — Anesthesia Preprocedure Evaluation (Addendum)
Anesthesia Evaluation  Patient identified by MRN, date of birth, ID band Patient awake    Reviewed: Allergy & Precautions, NPO status , Patient's Chart, lab work & pertinent test results, reviewed documented beta blocker date and time   Airway Mallampati: III  TM Distance: >3 FB Neck ROM: Full    Dental  (+) Chipped,  Full long beard:   Pulmonary neg pulmonary ROS,    Pulmonary exam normal breath sounds clear to auscultation       Cardiovascular hypertension, Pt. on home beta blockers and Pt. on medications + CAD and + DVT  Normal cardiovascular exam+ Valvular Problems/Murmurs AS  Rhythm:Regular Rate:Normal  ECG: SB, rate 58. Sinus bradycardia with 1st degree A-V block   Neuro/Psych negative neurological ROS  negative psych ROS   GI/Hepatic negative GI ROS, Neg liver ROS,   Endo/Other  Morbid obesity  Renal/GU Renal disease     Musculoskeletal  (+) Arthritis ,   Abdominal (+) + obese,   Peds  Hematology HLD   Anesthesia Other Findings right shoulder osteoarthritis  Reproductive/Obstetrics                           Anesthesia Physical Anesthesia Plan  ASA: III  Anesthesia Plan: General and Regional   Post-op Pain Management: GA combined w/ Regional for post-op pain   Induction: Intravenous  PONV Risk Score and Plan: 2 and Ondansetron, Dexamethasone, Midazolam and Treatment may vary due to age or medical condition  Airway Management Planned: Oral ETT and Video Laryngoscope Planned  Additional Equipment:   Intra-op Plan:   Post-operative Plan: Extubation in OR  Informed Consent: I have reviewed the patients History and Physical, chart, labs and discussed the procedure including the risks, benefits and alternatives for the proposed anesthesia with the patient or authorized representative who has indicated his/her understanding and acceptance.   Dental advisory given  Plan  Discussed with: CRNA  Anesthesia Plan Comments: (RE reviewed the following: Pt had workup for chest discomfort in 2018 by Dr. Doylene Canard. Cath showed mild coronary artery disease. Echo showed mild diffuse hypokinesia with ejection fraction of 50%. He had moderate calcification on is aortic valve and mild aortic stenosis. Dr. Doylene Canard recommended continue with PCP for management. Denies current CV symptoms.)     Anesthesia Quick Evaluation

## 2018-01-22 MED ORDER — DEXTROSE 5 % IV SOLN
3.0000 g | INTRAVENOUS | Status: AC
  Administered 2018-01-23: 3 g via INTRAVENOUS
  Filled 2018-01-22: qty 3

## 2018-01-23 ENCOUNTER — Inpatient Hospital Stay (HOSPITAL_COMMUNITY): Payer: BLUE CROSS/BLUE SHIELD

## 2018-01-23 ENCOUNTER — Inpatient Hospital Stay (HOSPITAL_COMMUNITY): Payer: BLUE CROSS/BLUE SHIELD | Admitting: Physician Assistant

## 2018-01-23 ENCOUNTER — Encounter (HOSPITAL_COMMUNITY)
Admission: RE | Disposition: A | Discharge status: Home / Self Care | Payer: Self-pay | Source: Home / Self Care | Attending: Orthopedic Surgery

## 2018-01-23 ENCOUNTER — Encounter (HOSPITAL_COMMUNITY): Payer: Self-pay | Admitting: *Deleted

## 2018-01-23 ENCOUNTER — Inpatient Hospital Stay (HOSPITAL_COMMUNITY)
Admission: RE | Admit: 2018-01-23 | Discharge: 2018-01-24 | DRG: 483 | Disposition: A | Discharge status: Home / Self Care | Payer: BLUE CROSS/BLUE SHIELD | Attending: Orthopedic Surgery | Admitting: Orthopedic Surgery

## 2018-01-23 ENCOUNTER — Inpatient Hospital Stay (HOSPITAL_COMMUNITY): Payer: BLUE CROSS/BLUE SHIELD | Admitting: Anesthesiology

## 2018-01-23 DIAGNOSIS — Z79899 Other long term (current) drug therapy: Secondary | ICD-10-CM | POA: Diagnosis not present

## 2018-01-23 DIAGNOSIS — M19011 Primary osteoarthritis, right shoulder: Secondary | ICD-10-CM | POA: Diagnosis not present

## 2018-01-23 DIAGNOSIS — M19019 Primary osteoarthritis, unspecified shoulder: Secondary | ICD-10-CM | POA: Diagnosis present

## 2018-01-23 DIAGNOSIS — Z86718 Personal history of other venous thrombosis and embolism: Secondary | ICD-10-CM | POA: Diagnosis not present

## 2018-01-23 DIAGNOSIS — Z471 Aftercare following joint replacement surgery: Secondary | ICD-10-CM | POA: Diagnosis not present

## 2018-01-23 DIAGNOSIS — Z96611 Presence of right artificial shoulder joint: Secondary | ICD-10-CM | POA: Diagnosis not present

## 2018-01-23 DIAGNOSIS — I10 Essential (primary) hypertension: Secondary | ICD-10-CM | POA: Diagnosis present

## 2018-01-23 DIAGNOSIS — N400 Enlarged prostate without lower urinary tract symptoms: Secondary | ICD-10-CM | POA: Diagnosis present

## 2018-01-23 DIAGNOSIS — I2510 Atherosclerotic heart disease of native coronary artery without angina pectoris: Secondary | ICD-10-CM | POA: Diagnosis not present

## 2018-01-23 DIAGNOSIS — Z79891 Long term (current) use of opiate analgesic: Secondary | ICD-10-CM

## 2018-01-23 DIAGNOSIS — Z8719 Personal history of other diseases of the digestive system: Secondary | ICD-10-CM

## 2018-01-23 DIAGNOSIS — G8918 Other acute postprocedural pain: Secondary | ICD-10-CM | POA: Diagnosis not present

## 2018-01-23 HISTORY — PX: TOTAL SHOULDER ARTHROPLASTY: SHX126

## 2018-01-23 SURGERY — ARTHROPLASTY, SHOULDER, TOTAL
Anesthesia: Regional | Site: Shoulder | Laterality: Right

## 2018-01-23 MED ORDER — HYDROMORPHONE HCL 1 MG/ML IJ SOLN: 0.5000 mg | INTRAMUSCULAR | Status: DC | PRN

## 2018-01-23 MED ORDER — METOCLOPRAMIDE HCL 5 MG PO TABS: 5.0000 mg | ORAL_TABLET | Freq: Three times a day (TID) | ORAL | Status: DC | PRN

## 2018-01-23 MED ORDER — SODIUM CHLORIDE 0.9 % IV SOLN
INTRAVENOUS | Status: DC | PRN
  Administered 2018-01-23: 60 ug/min via INTRAVENOUS

## 2018-01-23 MED ORDER — OXYCODONE HCL 5 MG PO TABS: 5.0000 mg | ORAL_TABLET | ORAL | Status: DC | PRN

## 2018-01-23 MED ORDER — BUPIVACAINE LIPOSOME 1.3 % IJ SUSP
INTRAMUSCULAR | Status: DC | PRN
  Administered 2018-01-23: 10 mL via PERINEURAL

## 2018-01-23 MED ORDER — TIZANIDINE HCL 4 MG PO TABS: 4.0000 mg | ORAL_TABLET | Freq: Three times a day (TID) | ORAL | Status: DC | PRN

## 2018-01-23 MED ORDER — FUROSEMIDE 20 MG PO TABS
20.0000 mg | ORAL_TABLET | ORAL | Status: DC
  Administered 2018-01-24: 20 mg via ORAL
  Filled 2018-01-23: qty 1

## 2018-01-23 MED ORDER — NAPHAZOLINE-PHENIRAMINE 0.025-0.3 % OP SOLN
1.0000 [drp] | Freq: Four times a day (QID) | OPHTHALMIC | Status: DC | PRN
  Filled 2018-01-23: qty 15

## 2018-01-23 MED ORDER — PROPOFOL 10 MG/ML IV BOLUS
INTRAVENOUS | Status: DC | PRN
  Administered 2018-01-23: 20 mg via INTRAVENOUS
  Administered 2018-01-23: 200 mg via INTRAVENOUS
  Administered 2018-01-23: 30 mg via INTRAVENOUS

## 2018-01-23 MED ORDER — ONDANSETRON HCL 4 MG/2ML IJ SOLN: 4.0000 mg | Freq: Once | INTRAMUSCULAR | Status: DC | PRN

## 2018-01-23 MED ORDER — LACTATED RINGERS IV SOLN: INTRAVENOUS | Status: AC

## 2018-01-23 MED ORDER — VANCOMYCIN HCL 1000 MG IV SOLR
INTRAVENOUS | Status: DC | PRN
  Administered 2018-01-23: 1000 mg via TOPICAL

## 2018-01-23 MED ORDER — MENTHOL 3 MG MT LOZG: 1.0000 | LOZENGE | OROMUCOSAL | Status: DC | PRN

## 2018-01-23 MED ORDER — NITROGLYCERIN 0.4 MG SL SUBL: 0.4000 mg | SUBLINGUAL_TABLET | SUBLINGUAL | Status: DC | PRN

## 2018-01-23 MED ORDER — ONDANSETRON HCL 4 MG/2ML IJ SOLN: 4.0000 mg | Freq: Four times a day (QID) | INTRAMUSCULAR | Status: DC | PRN

## 2018-01-23 MED ORDER — FENTANYL CITRATE (PF) 100 MCG/2ML IJ SOLN
INTRAMUSCULAR | Status: DC | PRN
  Administered 2018-01-23: 25 ug via INTRAVENOUS
  Administered 2018-01-23: 125 ug via INTRAVENOUS

## 2018-01-23 MED ORDER — VITAMIN B-12 1000 MCG PO TABS
1000.0000 ug | ORAL_TABLET | Freq: Every day | ORAL | Status: DC
  Administered 2018-01-24: 1000 ug via ORAL
  Filled 2018-01-23: qty 1

## 2018-01-23 MED ORDER — SUCCINYLCHOLINE CHLORIDE 20 MG/ML IJ SOLN
INTRAMUSCULAR | Status: DC | PRN
  Administered 2018-01-23: 100 mg via INTRAVENOUS

## 2018-01-23 MED ORDER — ROCURONIUM BROMIDE 50 MG/5ML IV SOSY
PREFILLED_SYRINGE | INTRAVENOUS | Status: AC
  Filled 2018-01-23: qty 10

## 2018-01-23 MED ORDER — CHLORHEXIDINE GLUCONATE 4 % EX LIQD: 60.0000 mL | Freq: Once | CUTANEOUS | Status: DC

## 2018-01-23 MED ORDER — LACTATED RINGERS IV SOLN
INTRAVENOUS | Status: DC | PRN
  Administered 2018-01-23: 07:00:00 via INTRAVENOUS

## 2018-01-23 MED ORDER — 0.9 % SODIUM CHLORIDE (POUR BTL) OPTIME
TOPICAL | Status: DC | PRN
  Administered 2018-01-23 (×3): 1000 mL
  Administered 2018-01-23: 2000 mL

## 2018-01-23 MED ORDER — METOCLOPRAMIDE HCL 5 MG/ML IJ SOLN: 5.0000 mg | Freq: Three times a day (TID) | INTRAMUSCULAR | Status: DC | PRN

## 2018-01-23 MED ORDER — BUPIVACAINE HCL (PF) 0.5 % IJ SOLN
INTRAMUSCULAR | Status: DC | PRN
  Administered 2018-01-23: 15 mL via PERINEURAL

## 2018-01-23 MED ORDER — DIPHENHYDRAMINE HCL 25 MG PO CAPS
25.0000 mg | ORAL_CAPSULE | Freq: Every day | ORAL | Status: DC
  Administered 2018-01-23: 25 mg via ORAL
  Filled 2018-01-23: qty 1

## 2018-01-23 MED ORDER — DEXAMETHASONE SODIUM PHOSPHATE 10 MG/ML IJ SOLN
INTRAMUSCULAR | Status: AC
  Filled 2018-01-23: qty 1

## 2018-01-23 MED ORDER — PHENOL 1.4 % MT LIQD: 1.0000 | OROMUCOSAL | Status: DC | PRN

## 2018-01-23 MED ORDER — PROPOFOL 10 MG/ML IV BOLUS
INTRAVENOUS | Status: AC
  Filled 2018-01-23: qty 40

## 2018-01-23 MED ORDER — FUROSEMIDE 20 MG PO TABS: 20.0000 mg | ORAL_TABLET | ORAL | Status: DC

## 2018-01-23 MED ORDER — ONDANSETRON HCL 4 MG/2ML IJ SOLN
INTRAMUSCULAR | Status: DC | PRN
  Administered 2018-01-23: 4 mg via INTRAVENOUS

## 2018-01-23 MED ORDER — ONDANSETRON HCL 4 MG PO TABS: 4.0000 mg | ORAL_TABLET | Freq: Four times a day (QID) | ORAL | Status: DC | PRN

## 2018-01-23 MED ORDER — MIDAZOLAM HCL 2 MG/2ML IJ SOLN
INTRAMUSCULAR | Status: AC
  Filled 2018-01-23: qty 2

## 2018-01-23 MED ORDER — CEFAZOLIN SODIUM-DEXTROSE 2-4 GM/100ML-% IV SOLN
2.0000 g | Freq: Four times a day (QID) | INTRAVENOUS | Status: AC
  Administered 2018-01-23 (×2): 2 g via INTRAVENOUS
  Filled 2018-01-23 (×2): qty 100

## 2018-01-23 MED ORDER — ROCURONIUM BROMIDE 10 MG/ML (PF) SYRINGE
PREFILLED_SYRINGE | INTRAVENOUS | Status: DC | PRN
  Administered 2018-01-23: 20 mg via INTRAVENOUS
  Administered 2018-01-23: 30 mg via INTRAVENOUS
  Administered 2018-01-23: 50 mg via INTRAVENOUS

## 2018-01-23 MED ORDER — TRAMADOL HCL 50 MG PO TABS
50.0000 mg | ORAL_TABLET | Freq: Four times a day (QID) | ORAL | Status: DC
  Administered 2018-01-23 – 2018-01-24 (×4): 50 mg via ORAL
  Filled 2018-01-23 (×4): qty 1

## 2018-01-23 MED ORDER — HYDRALAZINE HCL 25 MG PO TABS
25.0000 mg | ORAL_TABLET | Freq: Two times a day (BID) | ORAL | Status: DC
  Administered 2018-01-23 – 2018-01-24 (×2): 25 mg via ORAL
  Filled 2018-01-23 (×2): qty 1

## 2018-01-23 MED ORDER — LIDOCAINE 2% (20 MG/ML) 5 ML SYRINGE
INTRAMUSCULAR | Status: DC | PRN
  Administered 2018-01-23: 20 mg via INTRAVENOUS

## 2018-01-23 MED ORDER — LIDOCAINE 2% (20 MG/ML) 5 ML SYRINGE
INTRAMUSCULAR | Status: AC
  Filled 2018-01-23: qty 5

## 2018-01-23 MED ORDER — METOPROLOL TARTRATE 12.5 MG HALF TABLET
12.5000 mg | ORAL_TABLET | Freq: Two times a day (BID) | ORAL | Status: DC
  Administered 2018-01-23 – 2018-01-24 (×2): 12.5 mg via ORAL
  Filled 2018-01-23 (×2): qty 1

## 2018-01-23 MED ORDER — DEXAMETHASONE SODIUM PHOSPHATE 10 MG/ML IJ SOLN
INTRAMUSCULAR | Status: DC | PRN
  Administered 2018-01-23: 10 mg via INTRAVENOUS

## 2018-01-23 MED ORDER — ONDANSETRON HCL 4 MG/2ML IJ SOLN
INTRAMUSCULAR | Status: AC
  Filled 2018-01-23: qty 2

## 2018-01-23 MED ORDER — DOCUSATE SODIUM 100 MG PO CAPS
100.0000 mg | ORAL_CAPSULE | Freq: Two times a day (BID) | ORAL | Status: DC
  Administered 2018-01-23 – 2018-01-24 (×2): 100 mg via ORAL
  Filled 2018-01-23 (×2): qty 1

## 2018-01-23 MED ORDER — TAMSULOSIN HCL 0.4 MG PO CAPS
0.4000 mg | ORAL_CAPSULE | Freq: Every morning | ORAL | Status: DC
  Administered 2018-01-24: 0.4 mg via ORAL
  Filled 2018-01-23: qty 1

## 2018-01-23 MED ORDER — GLYCOPYRROLATE PF 0.2 MG/ML IJ SOSY
PREFILLED_SYRINGE | INTRAMUSCULAR | Status: AC
  Filled 2018-01-23: qty 1

## 2018-01-23 MED ORDER — FENTANYL CITRATE (PF) 100 MCG/2ML IJ SOLN: 25.0000 ug | INTRAMUSCULAR | Status: DC | PRN

## 2018-01-23 MED ORDER — MIDAZOLAM HCL 5 MG/5ML IJ SOLN
INTRAMUSCULAR | Status: DC | PRN
  Administered 2018-01-23: 2 mg via INTRAVENOUS

## 2018-01-23 MED ORDER — EPHEDRINE SULFATE-NACL 50-0.9 MG/10ML-% IV SOSY
PREFILLED_SYRINGE | INTRAVENOUS | Status: DC | PRN
  Administered 2018-01-23: 10 mg via INTRAVENOUS

## 2018-01-23 MED ORDER — FUROSEMIDE 40 MG PO TABS: 40.0000 mg | ORAL_TABLET | ORAL | Status: DC

## 2018-01-23 MED ORDER — EPHEDRINE 5 MG/ML INJ
INTRAVENOUS | Status: AC
  Filled 2018-01-23: qty 10

## 2018-01-23 MED ORDER — FENTANYL CITRATE (PF) 250 MCG/5ML IJ SOLN
INTRAMUSCULAR | Status: AC
  Filled 2018-01-23: qty 5

## 2018-01-23 MED ORDER — VANCOMYCIN HCL 1000 MG IV SOLR
INTRAVENOUS | Status: AC
  Filled 2018-01-23: qty 1000

## 2018-01-23 MED ORDER — ACETAMINOPHEN 325 MG PO TABS
325.0000 mg | ORAL_TABLET | Freq: Four times a day (QID) | ORAL | Status: DC | PRN
  Filled 2018-01-23: qty 2

## 2018-01-23 MED ORDER — SUCCINYLCHOLINE CHLORIDE 200 MG/10ML IV SOSY
PREFILLED_SYRINGE | INTRAVENOUS | Status: AC
  Filled 2018-01-23: qty 10

## 2018-01-23 MED ORDER — COQ10 100 MG PO CAPS: 100.0000 mg | ORAL_CAPSULE | Freq: Every day | ORAL | Status: DC

## 2018-01-23 MED ORDER — RIVAROXABAN 10 MG PO TABS
10.0000 mg | ORAL_TABLET | Freq: Every day | ORAL | Status: DC
  Administered 2018-01-23: 10 mg via ORAL
  Filled 2018-01-23: qty 1

## 2018-01-23 MED ORDER — DILTIAZEM HCL ER COATED BEADS 240 MG PO CP24
240.0000 mg | ORAL_CAPSULE | Freq: Every day | ORAL | Status: DC
  Administered 2018-01-24: 240 mg via ORAL
  Filled 2018-01-23: qty 1

## 2018-01-23 SURGICAL SUPPLY — 74 items
BASEPLATE GLENOSPHERE 25 (Plate) ×2 IMPLANT
BEARING HUMERAL 40 STD VITE (Joint) ×2 IMPLANT
BIT DRILL QUICK REL 1/8 2PK SL (DRILL) ×1 IMPLANT
BIT DRILL TWIST 2.7 (BIT) ×2 IMPLANT
BLADE SAW SGTL 13X75X1.27 (BLADE) ×2 IMPLANT
CHLORAPREP W/TINT 26ML (MISCELLANEOUS) ×4 IMPLANT
COVER SURGICAL LIGHT HANDLE (MISCELLANEOUS) ×2 IMPLANT
COVER WAND RF STERILE (DRAPES) IMPLANT
DIAL VERSA SHOULDER 40 STD (Joint) ×2 IMPLANT
DRAPE INCISE IOBAN 66X45 STRL (DRAPES) ×2 IMPLANT
DRAPE U-SHAPE 47X51 STRL (DRAPES) ×4 IMPLANT
DRILL QUICK RELEASE 1/8 INCH (DRILL) ×1
DRSG AQUACEL AG ADV 3.5X10 (GAUZE/BANDAGES/DRESSINGS) ×2 IMPLANT
ELECT BLADE 4.0 EZ CLEAN MEGAD (MISCELLANEOUS) ×2
ELECT REM PT RETURN 9FT ADLT (ELECTROSURGICAL) ×2
ELECTRODE BLDE 4.0 EZ CLN MEGD (MISCELLANEOUS) ×1 IMPLANT
ELECTRODE REM PT RTRN 9FT ADLT (ELECTROSURGICAL) ×1 IMPLANT
GLOVE BIOGEL PI IND STRL 7.5 (GLOVE) ×2 IMPLANT
GLOVE BIOGEL PI IND STRL 8 (GLOVE) ×1 IMPLANT
GLOVE BIOGEL PI INDICATOR 7.5 (GLOVE) ×2
GLOVE BIOGEL PI INDICATOR 8 (GLOVE) ×1
GLOVE ECLIPSE 7.0 STRL STRAW (GLOVE) ×4 IMPLANT
GLOVE SURG ORTHO 8.0 STRL STRW (GLOVE) ×4 IMPLANT
GLOVE SURG SS PI 7.0 STRL IVOR (GLOVE) ×2 IMPLANT
GLOVE SURG SS PI 8.0 STRL IVOR (GLOVE) ×4 IMPLANT
GOWN STRL REUS W/ TWL LRG LVL3 (GOWN DISPOSABLE) ×2 IMPLANT
GOWN STRL REUS W/ TWL XL LVL3 (GOWN DISPOSABLE) ×2 IMPLANT
GOWN STRL REUS W/TWL 2XL LVL3 (GOWN DISPOSABLE) ×2 IMPLANT
GOWN STRL REUS W/TWL LRG LVL3 (GOWN DISPOSABLE) ×2
GOWN STRL REUS W/TWL XL LVL3 (GOWN DISPOSABLE) ×2
KIT BASIN OR (CUSTOM PROCEDURE TRAY) ×2 IMPLANT
KIT TURNOVER KIT B (KITS) ×2 IMPLANT
MANIFOLD NEPTUNE II (INSTRUMENTS) ×2 IMPLANT
MODEL GLENOID TOTAL SIGNATURE (SYSTAGENIX WOUND MANAGEMENT) ×2 IMPLANT
NDL SUT 6 .5 CRC .975X.05 MAYO (NEEDLE) ×1 IMPLANT
NEEDLE HYPO 25GX1X1/2 BEV (NEEDLE) IMPLANT
NEEDLE MAYO TAPER (NEEDLE) ×1
NS IRRIG 1000ML POUR BTL (IV SOLUTION) ×10 IMPLANT
PACK SHOULDER (CUSTOM PROCEDURE TRAY) ×2 IMPLANT
PAD ARMBOARD 7.5X6 YLW CONV (MISCELLANEOUS) ×6 IMPLANT
PIN THREADED REVERSE (PIN) ×2 IMPLANT
RESTRAINT HEAD UNIVERSAL NS (MISCELLANEOUS) ×2 IMPLANT
RETRIEVER SUT HEWSON (MISCELLANEOUS) ×2 IMPLANT
SCREW BONE LOCKING 4.75X30X3.5 (Screw) ×2 IMPLANT
SCREW BONE STRL 6.5MMX25MM (Screw) ×2 IMPLANT
SCREW LOCKING 4.75MMX15MM (Screw) ×4 IMPLANT
SCREW LOCKING STRL 4.75X25X3.5 (Screw) ×2 IMPLANT
SLING ARM IMMOBILIZER LRG (SOFTGOODS) IMPLANT
SPONGE LAP 18X18 X RAY DECT (DISPOSABLE) ×2 IMPLANT
STEM SHOULDER 16MMX55MM LONG (Stem) ×2 IMPLANT
STRIP CLOSURE SKIN 1/2X4 (GAUZE/BANDAGES/DRESSINGS) ×4 IMPLANT
SUCTION FRAZIER HANDLE 10FR (MISCELLANEOUS) ×1
SUCTION TUBE FRAZIER 10FR DISP (MISCELLANEOUS) ×1 IMPLANT
SUT BROADBAND TAPE 2PK 1.5 (SUTURE) ×6 IMPLANT
SUT FIBERWIRE #2 38 T-5 BLUE (SUTURE)
SUT MAXBRAID (SUTURE) IMPLANT
SUT MNCRL AB 3-0 PS2 18 (SUTURE) ×2 IMPLANT
SUT PROLENE 5 0 P 3 (SUTURE) ×2 IMPLANT
SUT VIC AB 0 CT1 27 (SUTURE) ×2
SUT VIC AB 0 CT1 27XBRD ANBCTR (SUTURE) ×2 IMPLANT
SUT VIC AB 1 CT1 27 (SUTURE) ×2
SUT VIC AB 1 CT1 27XBRD ANBCTR (SUTURE) ×2 IMPLANT
SUT VIC AB 2-0 CT1 27 (SUTURE) ×2
SUT VIC AB 2-0 CT1 TAPERPNT 27 (SUTURE) ×2 IMPLANT
SUT VICRYL 0 UR6 27IN ABS (SUTURE) ×4 IMPLANT
SUTURE FIBERWR #2 38 T-5 BLUE (SUTURE) IMPLANT
SUTURE TAPE 1.3 40 TPR END (SUTURE) IMPLANT
SUTURETAPE 1.3 40 TPR END (SUTURE)
SYR CONTROL 10ML LL (SYRINGE) IMPLANT
TOWEL OR 17X26 10 PK STRL BLUE (TOWEL DISPOSABLE) ×2 IMPLANT
TRAY FOL W/BAG SLVR 16FR STRL (SET/KITS/TRAYS/PACK) ×1 IMPLANT
TRAY FOLEY W/BAG SLVR 16FR LF (SET/KITS/TRAYS/PACK) ×1
TRAY HUM REV SHOULDER STD +6 (Shoulder) ×2 IMPLANT
YANKAUER SUCT BULB TIP NO VENT (SUCTIONS) ×2 IMPLANT

## 2018-01-23 NOTE — Anesthesia Procedure Notes (Signed)
Anesthesia Regional Block: Interscalene brachial plexus block   Pre-Anesthetic Checklist: ,, timeout performed, Correct Patient, Correct Site, Correct Laterality, Correct Procedure, Correct Position, site marked, Risks and benefits discussed,  Surgical consent,  Pre-op evaluation,  At surgeon's request and post-op pain management  Laterality: Right  Prep: chloraprep       Needles:  Injection technique: Single-shot  Needle Type: Echogenic Stimulator Needle     Needle Length: 9cm  Needle Gauge: 21     Additional Needles:   Procedures:,,,, ultrasound used (permanent image in chart),,,,  Narrative:  Start time: 01/23/2018 7:15 AM End time: 01/23/2018 7:25 AM Injection made incrementally with aspirations every 5 mL.  Performed by: Personally  Anesthesiologist: Murvin Natal, MD  Additional Notes: Functioning IV was confirmed and monitors were applied.  A timeout was performed. Sterile prep, hand hygiene and sterile gloves were used. A 50mm 21ga Arrow echogenic stimulator needle was used. Negative aspiration and negative test dose prior to incremental administration of local anesthetic. The patient tolerated the procedure well.  Ultrasound guidance: relevent anatomy identified, needle position confirmed, local anesthetic spread visualized around nerve(s), vascular puncture avoided.  Image printed for medical record.

## 2018-01-23 NOTE — Op Note (Signed)
NAME: Stephen Maldonado, Stephen Maldonado MEDICAL RECORD [id number] ACCOUNT [id number] DATE OF BIRTH:27-Feb-1955 FACILITY: [facility] LOCATION: [facility] PHYSICIAN:Garnell Phenix Randel Pigg, MD  OPERATIVE REPORT  DATE OF PROCEDURE:  01/23/2018  PREOPERATIVE DIAGNOSIS:  Right shoulder arthritis.  POSTOPERATIVE DIAGNOSIS:  Right shoulder arthritis.  PROCEDURE:  Right reverse shoulder replacement using Bi[employer] components, primary stem micro size 16 x 55 with 40 mm diameter highly cross-linked polyethylene bearing and humeral tray standard thickness +6 taper offset 40 mm with 25 mm glenosphere  baseplate and 4 locking screws,  1 central screw and standard offset 40 mm glenosphere.  SURGEON:  Meredith Pel, MD  ASSISTANT:  April Green, RNFA.  INDICATIONS:  The patient is a 62 year old patient with end-stage right shoulder arthritis who presents for operative management.  He did have some subscap weakness on preoperative examination.  He presents now for operative management after explanation  of risks and benefits.  PROCEDURE IN DETAIL:  The patient was brought to the operating room where general anesthetic was induced.  Preoperative antibiotics administered.  Timeout was called.  The patient was placed in the beach chair position with the head in neutral position,  well secured.  The patient's right arm, shoulder and hand was prescrubbed with hydrogen peroxide, alcohol and Betadine, which was allowed to air dry, then ChloraPrep solution was utilized.  Charlie Pitter was used to cover the operative field.  Timeout was  called.  A deltopectoral approach was made.  Cephalic vein mobilized medially.  Deltopectoral interval was developed.  The anterior portion of the deltoid attachment was elevated to assist with exposure.  The biceps tendon was tenodesed to the pec major  tendon.  The circumflex vessels were ligated with suture ligatures.  Axillary nerve was visualized, identified and a vessel loop placed around it.  It was protected at  all times during the remaining portion of the case.  Subscap was intact, but did have  somewhat poor muscle quality.  It was detached from the lesser tuberosity.  The capsule was then detached from the inferior humeral neck for about 2 cm around to the 7 o'clock position.  The supraspinatus was intact.  The head was dislocated.  Then, the  head was cut after broaching after reaming about 16 mm.  The head was then cut in about 30 degrees of retroversion consistent with its native version.  At this time, a cap was placed in the exposure of the glenoid was performed.  The coracohumeral  ligament was fully released to the base of the coracoid.  Subscapularis was mobilized with release of the inferior and middle glenohumeral ligaments.  Care was taken to avoid injury to the nerve.  Cobb elevator was used to elevate the tissue around from  the 12 o'clock to 7 o'clock position.  There was some posterior wear on the glenoid.  In accordance with the patient's specific instrumentation, the glenoid guide was placed and guide pin was placed in the central portion of the vault.  Reaming was  performed until a bleeding bony surface was encountered circumferentially.  At this time, the baseplate was placed with good fixation achieved.  One central screw and 4 peripheral locking screws were placed.  Standard glenosphere was then placed with  good position achieved.  At this time, attention was directed towards the humerus.  Broaching was performed up to size 16.  Then, reduction with the standard bearing with a +6 taper offset thickness humeral tray was performed.  This gave excellent  stability.  It had very good  stability to extension, forward flexion and abduction.  It also allowed forward flexion abduction well above 90 degrees.  At this time, trial components were removed on the humeral side.  Thorough irrigation performed and the  true components placed with same stability parameters maintained.  Prior to placing the  components,  the humeral shaft was drilled for attachment of the subscap.  With the arm in about 30 degrees of external rotation, the subscapularis was reattached  using suture tape and Nice knots.  This gave good secure repair of the subscap.  Rotator interval was closed with the arm in about 30 degrees of external rotation.  Vancomycin powder was then placed in the joint and within the deltopectoral interval.   The deltopectoral interval was then closed using #1 Vicryl suture followed by interrupted inverted 0 Vicryl suture, 2-0 Vicryl suture and a 3-0 Monocryl.  Steri-Strips and Aquacel dressing applied along with a shoulder sling.  The patient tolerated the  procedure well without immediate complications.  He was transferred to the recovery room in stable condition.  TN/NUANCE  D:01/23/2018 T:01/23/2018 JOB:[id number]

## 2018-01-23 NOTE — Anesthesia Procedure Notes (Signed)
Procedure Name: Intubation Date/Time: 01/23/2018 7:48 AM Performed by: Cleda Daub, CRNA Pre-anesthesia Checklist: Patient identified, Emergency Drugs available, Suction available and Patient being monitored Patient Re-evaluated:Patient Re-evaluated prior to induction Oxygen Delivery Method: Circle system utilized Preoxygenation: Pre-oxygenation with 100% oxygen Induction Type: IV induction Laryngoscope Size: Glidescope Grade View: Grade I Tube type: Oral Tube size: 8.0 mm Number of attempts: 1 Airway Equipment and Method: Stylet Placement Confirmation: ETT inserted through vocal cords under direct vision,  positive ETCO2 and breath sounds checked- equal and bilateral Secured at: 23 cm Tube secured with: Tape Dental Injury: Teeth and Oropharynx as per pre-operative assessment

## 2018-01-23 NOTE — H&P (Signed)
Stephen Maldonado is an 62 y.o. male.   Chief Complaint: Right shoulder pain HPI: Stephen Maldonado is a patient with right shoulder pain of long duration.  He has arthritis based on plain radiographs and CT scan.  He also has subscapularis atrophy based on CT scan and examination.  He describes night pain and rest pain which is been refractory to conservative management.  He would like to have shoulder replacement in order to relieve pain.  He does have a history of DVT in the left leg.  We will plan to use aspirin for DVT prophylaxis postoperatively.  Past Medical History:  Diagnosis Date  . Arthritis    knees  . Bladder spasms   . BPH (benign prostatic hyperplasia)   . DVT (deep venous thrombosis) ([place]) ocyt 2016   left leg tx medically  . GERD (gastroesophageal reflux disease)   . History of colon polyps    hyperplastic 06-14-2010  . Hydronephrosis   . Hypertension   . Urinary retention     Past Surgical History:  Procedure Laterality Date  . COLONOSCOPY  06/14/2010  . CORONARY ANGIOGRAPHY N/A 07/11/2016   Procedure: Coronary Angiography;  Surgeon: Dixie Dials, MD;  Location: [facility];  Service: Cardiovascular;  Laterality: N/A;  . CYSTOSCOPY W/ RETROGRADES Bilateral 10/03/2015   Procedure: CYSTOSCOPY WITH RETROGRADE PYELOGRAM;  Surgeon: [name], MD;  Location: [facility];  Service: Urology;  Laterality: Bilateral;  . GREEN LIGHT LASER TURP (TRANSURETHRAL RESECTION OF PROSTATE N/A 10/03/2015   Procedure: GREEN LIGHT LASER,  TURP - STAGED(TRANSURETHRAL RESECTION OF PROSTATE;  Surgeon: [name], MD;  Location: [facility];  Service: Urology;  Laterality: N/A;  . KNEE ARTHROSCOPY     not sure which knee  . QUADRICEPS TENDON REPAIR Bilateral left 02/24/2003/   right 2006  . TOTAL KNEE ARTHROPLASTY Right 12/01/2015   Procedure: RIGHT TOTAL KNEE ARTHROPLASTY;  Surgeon: [name], MD;  Location: [facility];  Service: Orthopedics;   Laterality: Right;  . TOTAL KNEE ARTHROPLASTY Left 01/10/2017   Procedure: LEFT TOTAL KNEE ARTHROPLASTY;  Surgeon: [name], MD;  Location: [facility];  Service: Orthopedics;  Laterality: Left;    History reviewed. No pertinent family history. Social History:  reports that he has never smoked. He quit smokeless tobacco use about 50 years ago.  His smokeless tobacco use included chew. He reports current alcohol use. He reports that he does not use drugs.  Allergies:  Allergies  Allergen Reactions  . Lipitor [Atorvastatin] Other (See Comments)    Muscle fatigue, soreness and dreams crazy    Medications Prior to Admission  Medication Sig Dispense Refill  . Cholecalciferol (VITAMIN D-3 PO) Take 2,000 Units by mouth daily.     . Coenzyme Q10 (COQ10) 100 MG CAPS Take 100 mg by mouth daily.     Marland Kitchen diltiazem (CARDIZEM CD) 240 MG 24 hr capsule Take 240 mg by mouth daily.  5  . diphenhydrAMINE (BENADRYL) 25 MG tablet Take 25 mg by mouth at bedtime.    . furosemide (LASIX) 20 MG tablet Take 20-40 mg by mouth See admin instructions. Take 20 mg by mouth every other day alternating with 40 mg by mouth every other day  0  . Garlic 6767 MG CAPS Take 1,000 mg by mouth daily.    . hydrALAZINE (APRESOLINE) 25 MG tablet Take 25 mg by mouth 2 (two) times daily with a meal.  3  . Lidocaine-Menthol (ICY HOT LIDOCAINE PLUS MENTHOL EX)  Apply 1 application topically as needed (for back pain).    . metoprolol tartrate (LOPRESSOR) 25 MG tablet Take 0.5 tablets (12.5 mg total) by mouth 2 (two) times daily. 30 tablet 3  . Omega-3 Fatty Acids (FISH OIL) 1000 MG CAPS Take 1,000 mg by mouth daily.     Marland Kitchen oxyCODONE (OXY IR/ROXICODONE) 5 MG immediate release tablet 1 po q d prn pain 30 tablet 0  . tamsulosin (FLOMAX) 0.4 MG CAPS capsule Take 0.4 mg by mouth every morning.    Marland Kitchen tiZANidine (ZANAFLEX) 4 MG tablet TAKE 1 TABLET (4 MG TOTAL) BY MOUTH EVERY 8 (EIGHT) HOURS AS NEEDED FOR MUSCLE SPASMS. 60 tablet 0  .  Turmeric 500 MG TABS Take 500 mg by mouth daily.    . vitamin B-12 (CYANOCOBALAMIN) 1000 MCG tablet Take 1,000 mcg by mouth daily.    . vitamin C (ASCORBIC ACID) 500 MG tablet Take 500 mg by mouth daily.     Marland Kitchen atorvastatin (LIPITOR) 40 MG tablet Take 1 tablet (40 mg total) by mouth daily at 6 PM. (Patient not taking: Reported on 01/03/2017) 30 tablet 3  . docusate sodium (COLACE) 100 MG capsule Take 1 capsule (100 mg total) by mouth 2 (two) times daily. (Patient not taking: Reported on 01/15/2018) 10 capsule 0  . nitroGLYCERIN (NITROSTAT) 0.4 MG SL tablet Place 1 tablet (0.4 mg total) under the tongue every 5 (five) minutes x 3 doses as needed for chest pain. 25 tablet 1  . oxyCODONE (OXY IR/ROXICODONE) 5 MG immediate release tablet Take 1-2 tablets (5-10 mg total) by mouth every 4 (four) hours as needed for severe pain ((score 7 to 10)). (Patient not taking: Reported on 01/15/2018) 40 tablet 0  . oxyCODONE-acetaminophen (ROXICET) 5-325 MG tablet Take 1-2 tablets by mouth every 6 (six) hours as needed for severe pain. 30 tablet 0  . rivaroxaban (XARELTO) 10 MG TABS tablet Take 1 tablet (10 mg total) by mouth daily. (Patient not taking: Reported on 01/15/2018) 30 tablet 0  . traMADol (ULTRAM) 50 MG tablet Take 2 tablets (100 mg total) by mouth 3 (three) times daily as needed. (Patient not taking: Reported on 01/03/2017) 60 tablet 0    No results found for this or any previous visit (from the past 48 hour(s)). No results found.  Review of Systems  Musculoskeletal: Positive for joint pain.  All other systems reviewed and are negative.   Blood pressure (!) 150/70, pulse 70, temperature 98.9 F (37.2 C), temperature source Oral, resp. rate 18. Physical Exam  Constitutional: He appears well-developed.  HENT:  Head: Normocephalic.  Eyes: Pupils are equal, round, and reactive to light.  Neck: Normal range of motion.  Cardiovascular: Normal rate.  Respiratory: Effort normal.  Neurological: He is  alert.  Skin: Skin is warm.  Psychiatric: He has a normal mood and affect.  Examination of the right shoulder demonstrates painful range of motion with subscap strength on the right at 3- out of 5 on the left 5 out of 5.  Patient does have functional deltoid.  Body mass index is increased.  Motor or sensory function to the hand is intact.  He does not have passive or active range of motion above 90 degrees.  Assessment/Plan Impression is right shoulder arthritis with subscapularis deficiency on examination and by CT scanning.  Plan is reverse shoulder replacement.  The risk and benefits are discussed with the patient including but not limited to infection nerve vessel damage dislocation as well as incomplete pain relief.  He is also cautioned as he was in the clinic about the need to not do a lot of heavy lifting and activities with that right shoulder.  Patient understands the risk and benefits as well as the recommended activity restrictions following this type of replacement.  All questions answered  Anderson Malta, MD 01/23/2018, 7:12 AM

## 2018-01-23 NOTE — Anesthesia Postprocedure Evaluation (Signed)
Anesthesia Post Note  Patient: Stephen Maldonado  Procedure(s) Performed: RIGHT REVERSE TOTAL SHOULDER ARTHROPLASTY (Right Shoulder)     Patient location during evaluation: PACU Anesthesia Type: Regional and General Level of consciousness: awake Pain management: pain level controlled Vital Signs Assessment: post-procedure vital signs reviewed and stable Respiratory status: spontaneous breathing, nonlabored ventilation, respiratory function stable and patient connected to nasal cannula oxygen Cardiovascular status: blood pressure returned to baseline and stable Postop Assessment: no apparent nausea or vomiting Anesthetic complications: no    Last Vitals:  Vitals:   01/23/18 1430 01/23/18 1509  BP:  (!) 147/68  Pulse:  77  Resp:    Temp: 36.7 C 36.6 C  SpO2:  92%    Last Pain:  Vitals:   01/23/18 1509  TempSrc: Oral  PainSc:                  Karyl Kinnier Ellender

## 2018-01-23 NOTE — Brief Op Note (Signed)
01/23/2018  11:07 AM  PATIENT:  [name]  62 y.o. male  PRE-OPERATIVE DIAGNOSIS:  right shoulder osteoarthritis  POST-OPERATIVE DIAGNOSIS:  right shoulder osteoarthritis  PROCEDURE:  Procedure(s): RIGHT REVERSE TOTAL SHOULDER ARTHROPLASTY  SURGEON:  Surgeon(s): Marlou Sa, Tonna Corner, MD  ASSISTANT: Nyoka Cowden rnfa  ANESTHESIA:   general  EBL: 50 ml    Total I/O In: 1000 [I.V.:1000] Out: 750 [Urine:600; Blood:150]  BLOOD ADMINISTERED: none  DRAINS: none   LOCAL MEDICATIONS USED:  none  SPECIMEN:  No Specimen  COUNTS:  YES  TOURNIQUET:  * No tourniquets in log *  DICTATION: .Other Dictation: Dictation Number [id number]  PLAN OF CARE: Admit to inpatient   PATIENT DISPOSITION:  PACU - hemodynamically stable

## 2018-01-23 NOTE — Transfer of Care (Signed)
Immediate Anesthesia Transfer of Care Note  Patient: Stephen Maldonado  Procedure(s) Performed: RIGHT REVERSE TOTAL SHOULDER ARTHROPLASTY (Right Shoulder)  Patient Location: PACU  Anesthesia Type:GA combined with regional for post-op pain  Level of Consciousness: awake, alert , oriented and patient cooperative  Airway & Oxygen Therapy: Patient Spontanous Breathing and Patient connected to face mask oxygen  Post-op Assessment: Report given to RN and Post -op Vital signs reviewed and stable  Post vital signs: Reviewed and stable  Last Vitals:  Vitals Value Taken Time  BP 165/65 01/23/2018 12:16 PM  Temp    Pulse 87 01/23/2018 12:20 PM  Resp 28 01/23/2018 12:20 PM  SpO2 96 % 01/23/2018 12:20 PM  Vitals shown include unvalidated device data.  Last Pain:  Vitals:   01/23/18 0623  TempSrc:   PainSc: 0-No pain         Complications: No apparent anesthesia complications

## 2018-01-24 MED ORDER — RIVAROXABAN 10 MG PO TABS: 10.0000 mg | ORAL_TABLET | Freq: Every day | ORAL | 0 refills | Status: DC

## 2018-01-24 MED ORDER — OXYCODONE HCL 5 MG PO TABS: 10.0000 mg | ORAL_TABLET | ORAL | 0 refills | Status: DC | PRN

## 2018-01-24 MED ORDER — ACETAMINOPHEN 325 MG PO TABS: 325.0000 mg | ORAL_TABLET | Freq: Four times a day (QID) | ORAL | 0 refills | Status: DC | PRN

## 2018-01-24 NOTE — Progress Notes (Signed)
OT Cancellation Note  Patient Details Name: Stephen Maldonado MRN: [id number] DOB: May 06, 1955   Cancelled Treatment:    Reason Eval/Treat Not Completed: Other (comment). Pt's wife not here yet for education. Pt to let RN know when she gets here and I gave RN my pager number.  Golden Circle, OTR/L Acute Rehab Services Pager [phone] Office [phone]     Almon Register 01/24/2018, 8:26 AM

## 2018-01-24 NOTE — Progress Notes (Signed)
Subjective: Pt stable - block starting to wear off   Objective: Vital signs in last 24 hours: Temp:  [97.6 F (36.4 C)-99 F (37.2 C)] 97.6 F (36.4 C) (12/21 0509) Pulse Rate:  [77-95] 87 (12/21 0509) Resp:  [20-21] 20 (12/20 1415) BP: (139-190)/(63-74) 190/74 (12/21 0509) SpO2:  [89 %-93 %] 93 % (12/21 0110)  Intake/Output from previous day: 12/20 0701 - 12/21 0700 In: 1900 [I.V.:1900] Out: 3050 [Urine:2900; Blood:150] Intake/Output this shift: No intake/output data recorded.  Exam:  Intact pulses distally No cellulitis present Compartment soft  Labs: No results for input(s): HGB in the last 72 hours. No results for input(s): WBC, RBC, HCT, PLT in the last 72 hours. No results for input(s): NA, K, CL, CO2, BUN, CREATININE, GLUCOSE, CALCIUM in the last 72 hours. No results for input(s): LABPT, INR in the last 72 hours.  Assessment/Plan: Plan OT today then dc early this afternoon    G Alphonzo Severance 01/24/2018, 7:43 AM

## 2018-01-24 NOTE — Care Management (Signed)
Pt set up preoperatively with [facility] for [facility] OT.  [facility] requires PT to assess if patient is seen by OT.  Alwyn Ren with [facility] aware that patient will d/c today.

## 2018-01-24 NOTE — Discharge Instructions (Signed)

## 2018-01-24 NOTE — Evaluation (Signed)
Occupational Therapy Evaluation and Discharge Patient Details Name: Stephen Maldonado MRN: [id number] DOB: 08-22-1955 Today's Date: 01/24/2018    History of Present Illness Right reverse shoulder replacement; PHMx: R TKR 2017   Clinical Impression   This 62 yo male admitted and underwent above presents to acute OT with all education completed with pt and wife, we will D/C from acute OT.    Follow Up Recommendations  (follow up per Dr. Marlou Sa)    Equipment Recommendations  None recommended by OT       Precautions / Restrictions Precautions Precautions: Shoulder Shoulder Interventions: Shoulder sling/immobilizer;At all times;Off for dressing/bathing/exercises Precaution Booklet Issued: Yes (comment) Required Braces or Orthoses: Sling Restrictions Weight Bearing Restrictions: Yes RUE Weight Bearing: Non weight bearing      Mobility Bed Mobility Overal bed mobility: Needs Assistance Bed Mobility: Supine to Sit     Supine to sit: Min assist        Transfers Overall transfer level: Modified independent               General transfer comment: slower pace than normal--"sea legs" per pt        ADL either performed or assessed with clinical judgement         Vision Patient Visual Report: No change from baseline              Pertinent Vitals/Pain Pain Assessment: 0-10 Pain Score: 2  Pain Location: shoulder Pain Descriptors / Indicators: Aching;Sore Pain Intervention(s): Limited activity within patient's tolerance;Monitored during session;Repositioned     Hand Dominance Right   Extremity/Trunk Assessment Upper Extremity Assessment Upper Extremity Assessment: RUE deficits/detail RUE Deficits / Details: shoulder sx this admission; shoulder ROM limited due to pain and parameters set by MD for ROM; elbow wrist and hand WNL for AAROM (block not totally worn off) RUE Coordination: decreased fine motor;decreased gross motor           Communication  Communication Communication: No difficulties   Cognition Arousal/Alertness: Awake/alert Behavior During Therapy: WFL for tasks assessed/performed Overall Cognitive Status: Within Functional Limits for tasks assessed                                        Exercises Other Exercises Other Exercises: Pt completed 10 reps supine of PROM of shoulder flexion to~30 degrees (goal being 90 degrees); completed in sitting 10 reps PROM shoulder external rotation almost to neutral (goal being 30 degrees past neutral), 10 reps PROM shoulder abduction to~20 degrees (goal 60 degrees), 10 reps elbow-forearm-wrist, and hand exercises AAROM due to block not totally worn off; in standing 10 reps of AROM elbow flexion/extension Other Exercises: Pt and wife aware he needs to do exercises 3x/day 10 reps each   Shoulder Instructions Shoulder Instructions Donning/doffing shirt without moving shoulder: Caregiver independent with task Method for sponge bathing under operated UE: Caregiver independent with task Donning/doffing sling/immobilizer: Caregiver independent with task Correct positioning of sling/immobilizer: Caregiver independent with task Pendulum exercises (written home exercise program): (NA) ROM for elbow, wrist and digits of operated UE: Caregiver independent with task Sling wearing schedule (on at all times/off for ADL's): Caregiver independent with task Proper positioning of operated UE when showering: Independent Dressing change: (NA) Positioning of UE while sleeping: Caregiver independent with task    Home Living Family/patient expects to be discharged to:: Private residence Living Arrangements: Spouse/significant other Available Help at Discharge: Family;Available 24 hours/day  Type of Home: House Home Access: Stairs to enter [employer] of Steps: 4 Entrance Stairs-Rails: Right;Left Home Layout: One level     Bathroom Shower/Tub: Tub/shower unit;Walk-in shower    Bathroom Toilet: Standard     Home Equipment: Environmental consultant - 2 wheels;Cane - single point;Crutches          Prior Functioning/Environment Level of Independence: Independent                 OT Problem List: Decreased strength;Decreased range of motion;Impaired UE functional use;Pain         OT Goals(Current goals can be found in the care plan section) Acute Rehab OT Goals Patient Stated Goal: to go home today OT Goal Formulation: With patient/family  OT Frequency:                AM-PAC OT "6 Clicks" Daily Activity     Outcome Measure Help from another person eating meals?: None Help from another person taking care of personal grooming?: A Little Help from another person toileting, which includes using toliet, bedpan, or urinal?: A Lot Help from another person bathing (including washing, rinsing, drying)?: A Lot Help from another person to put on and taking off regular upper body clothing?: A Lot Help from another person to put on and taking off regular lower body clothing?: A Lot 6 Click Score: 15   End of Session Equipment Utilized During Treatment: Other (comment)(sling) Nurse Communication: (pt ready to go from therapy standpoint)  Activity Tolerance: Patient tolerated treatment well Patient left: (sitting EOB)  OT Visit Diagnosis: Pain Pain - Right/Left: Right Pain - part of body: Arm                Time: 1112-1202 OT Time Calculation (min): 50 min Charges:  OT General Charges $OT Visit: 1 Visit OT Evaluation $OT Eval Moderate Complexity: 1 Mod OT Treatments $Self Care/Home Management : 8-22 mins $Therapeutic Exercise: 8-22 mins  Golden Circle, OTR/L Acute Rehab Services Pager [phone] Office [phone]     Almon Register 01/24/2018, 1:07 PM

## 2018-01-26 ENCOUNTER — Telehealth (INDEPENDENT_AMBULATORY_CARE_PROVIDER_SITE_OTHER): Payer: Self-pay | Admitting: Orthopedic Surgery

## 2018-01-26 NOTE — Telephone Encounter (Signed)
FYI-patient refused PT

## 2018-01-26 NOTE — Telephone Encounter (Signed)
Susan/PT/[facility] called to set up PT and patient REFUSED ANY. Wants Dr Gean Birchwood to look into matter. They feel he is very stiff and needs PT.  Please call Manuela Schwartz to advise  Manuela Schwartz [phone]

## 2018-01-27 ENCOUNTER — Encounter (HOSPITAL_COMMUNITY): Payer: Self-pay | Admitting: Orthopedic Surgery

## 2018-01-27 NOTE — Discharge Summary (Signed)
Physician Discharge Summary  Patient ID: Stephen Maldonado MRN: [id number] DOB/AGE: 06/03/1955 62 y.o.  Admit date: 01/23/2018 Discharge date: 01/24/2018  Admission Diagnoses:  Active Problems:   Shoulder arthritis   Discharge Diagnoses:  Same  Surgeries: Procedure(s): RIGHT REVERSE TOTAL SHOULDER ARTHROPLASTY on 01/23/2018   Consultants:   Discharged Condition: Stable  Hospital Course: BRIYAN KLEVEN is an 62 y.o. male who was admitted 01/23/2018 with a chief complaint of right shoulder pain, and found to have a diagnosis of right shoulder arthritis.  They were brought to the operating room on 01/23/2018 and underwent the above named procedures.  Patient tolerated procedure well without immediate complications.  He was in good condition on postop day #1.  Seen by occupational therapy at that time for passive and active shoulder range of motion exercises.  He will follow-up with me in 10 days for clinical recheck.  Discharged home in good condition with Xarelto for DVT prophylaxis 10 mg p.o. daily along with oxycodone for pain.  Antibiotics given:  Anti-infectives (From admission, onward)   Start     Dose/Rate Route Frequency Ordered Stop   01/23/18 1700  ceFAZolin (ANCEF) IVPB 2g/100 mL premix     2 g 200 mL/hr over 30 Minutes Intravenous Every 6 hours 01/23/18 1612 01/23/18 2329   01/23/18 1031  vancomycin (VANCOCIN) powder  Status:  Discontinued       As needed 01/23/18 1031 01/23/18 1212   01/23/18 0630  ceFAZolin (ANCEF) 3 g in dextrose 5 % 50 mL IVPB     3 g 100 mL/hr over 30 Minutes Intravenous To [facility]Stay [facility] 01/22/18 1355 01/23/18 1612    .  Recent vital signs:  Vitals:   01/24/18 0110 01/24/18 0509  BP: (!) 160/63 (!) 190/74  Pulse: 94 87  Resp:    Temp: 97.9 F (36.6 C) 97.6 F (36.4 C)  SpO2: 93%     Recent laboratory studies:  Results for orders placed or performed during the hospital encounter of 01/15/18  Urine culture  Result Value Ref Range    Specimen Description URINE, CLEAN CATCH    Special Requests NONE    Culture      NO GROWTH Performed at [facility], [place] [address] [place], [place] [address]    Report Status 01/16/2018 FINAL   Surgical pcr screen  Result Value Ref Range   MRSA, PCR NEGATIVE NEGATIVE   Staphylococcus aureus NEGATIVE NEGATIVE  Basic metabolic panel  Result Value Ref Range   Sodium 140 135 - 145 mmol/L   Potassium 4.4 3.5 - 5.1 mmol/L   Chloride 102 98 - 111 mmol/L   CO2 24 22 - 32 mmol/L   Glucose, Bld 98 70 - 99 mg/dL   BUN 22 8 - 23 mg/dL   Creatinine, Ser 1.95 (H) 0.61 - 1.24 mg/dL   Calcium 9.4 8.9 - 10.3 mg/dL   GFR calc non Af Amer 36 (L) >60 mL/min   GFR calc Af Amer 41 (L) >60 mL/min   Anion gap 14 5 - 15  CBC  Result Value Ref Range   WBC 7.8 4.0 - 10.5 K/uL   RBC 5.38 4.22 - 5.81 MIL/uL   Hemoglobin 15.1 13.0 - 17.0 g/dL   HCT 49.5 39.0 - 52.0 %   MCV 92.0 80.0 - 100.0 fL   MCH 28.1 26.0 - 34.0 pg   MCHC 30.5 30.0 - 36.0 g/dL   RDW 14.1 11.5 - 15.5 %   Platelets 219 150 -  400 K/uL   nRBC 0.0 0.0 - 0.2 %  Urinalysis, Routine w reflex microscopic  Result Value Ref Range   Color, Urine STRAW (A) YELLOW   APPearance CLEAR CLEAR   Specific Gravity, Urine 1.008 1.005 - 1.030   pH 5.0 5.0 - 8.0   Glucose, UA NEGATIVE NEGATIVE mg/dL   Hgb urine dipstick NEGATIVE NEGATIVE   Bilirubin Urine NEGATIVE NEGATIVE   Ketones, ur NEGATIVE NEGATIVE mg/dL   Protein, ur 30 (A) NEGATIVE mg/dL   Nitrite NEGATIVE NEGATIVE   Leukocytes, UA NEGATIVE NEGATIVE   RBC / HPF 0-5 0 - 5 RBC/hpf   WBC, UA 0-5 0 - 5 WBC/hpf   Bacteria, UA NONE SEEN NONE SEEN   Squamous Epithelial / LPF 0-5 0 - 5    Discharge Medications:   Allergies as of 01/24/2018      Reactions   Lipitor [atorvastatin] Other (See Comments)   Muscle fatigue, soreness and dreams crazy      Medication List    STOP taking these medications   docusate sodium 100 MG capsule Commonly known as:  COLACE   Fish Oil 1000 MG  Caps   oxyCODONE-acetaminophen 5-325 MG tablet Commonly known as:  ROXICET   traMADol 50 MG tablet Commonly known as:  ULTRAM     TAKE these medications   acetaminophen 325 MG tablet Commonly known as:  TYLENOL Take 1-2 tablets (325-650 mg total) by mouth every 6 (six) hours as needed for mild pain (pain score 1-3 or temp > 100.5).   atorvastatin 40 MG tablet Commonly known as:  LIPITOR Take 1 tablet (40 mg total) by mouth daily at 6 PM.   CoQ10 100 MG Caps Take 100 mg by mouth daily.   diltiazem 240 MG 24 hr capsule Commonly known as:  CARDIZEM CD Take 240 mg by mouth daily.   diphenhydrAMINE 25 MG tablet Commonly known as:  BENADRYL Take 25 mg by mouth at bedtime.   furosemide 20 MG tablet Commonly known as:  LASIX Take 20-40 mg by mouth See admin instructions. Take 20 mg by mouth every other day alternating with 40 mg by mouth every other day   Garlic 0981 MG Caps Take 1,000 mg by mouth daily.   hydrALAZINE 25 MG tablet Commonly known as:  APRESOLINE Take 25 mg by mouth 2 (two) times daily with a meal.   ICY HOT LIDOCAINE PLUS MENTHOL EX Apply 1 application topically as needed (for back pain).   metoprolol tartrate 25 MG tablet Commonly known as:  LOPRESSOR Take 0.5 tablets (12.5 mg total) by mouth 2 (two) times daily.   nitroGLYCERIN 0.4 MG SL tablet Commonly known as:  NITROSTAT Place 1 tablet (0.4 mg total) under the tongue every 5 (five) minutes x 3 doses as needed for chest pain.   oxyCODONE 5 MG immediate release tablet Commonly known as:  Oxy IR/ROXICODONE Take 2 tablets (10 mg total) by mouth every 4 (four) hours as needed for moderate pain (pain score 4-6). What changed:    how much to take  reasons to take this  Another medication with the same name was removed. Continue taking this medication, and follow the directions you see here.   rivaroxaban 10 MG Tabs tablet Commonly known as:  XARELTO Take 1 tablet (10 mg total) by mouth at  bedtime. What changed:  when to take this   tamsulosin 0.4 MG Caps capsule Commonly known as:  FLOMAX Take 0.4 mg by mouth every morning.   tiZANidine  4 MG tablet Commonly known as:  ZANAFLEX TAKE 1 TABLET (4 MG TOTAL) BY MOUTH EVERY 8 (EIGHT) HOURS AS NEEDED FOR MUSCLE SPASMS.   Turmeric 500 MG Tabs Take 500 mg by mouth daily.   vitamin B-12 1000 MCG tablet Commonly known as:  CYANOCOBALAMIN Take 1,000 mcg by mouth daily.   vitamin C 500 MG tablet Commonly known as:  ASCORBIC ACID Take 500 mg by mouth daily.   VITAMIN D-3 PO Take 2,000 Units by mouth daily.       Diagnostic Studies: Dg Shoulder Right Port  Result Date: 01/23/2018 CLINICAL DATA:  62 year old male post right shoulder replacement. Subsequent encounter. EXAM: PORTABLE RIGHT SHOULDER COMPARISON:  12/02/2017 CT. FINDINGS: Single portable view of the right shoulder reveals patient is post right shoulder replacement which appears in satisfactory position without complication noted on this single projection. Acromioclavicular joint degenerative changes. IMPRESSION: Post right shoulder replacement. Electronically Signed   By: Genia Del M.D.   On: 01/23/2018 13:16    Disposition:   Discharge Instructions    Call MD / Call 911   Complete by:  As directed    If you experience chest pain or shortness of breath, CALL 911 and be transported to the hospital emergency room.  If you develope a fever above 101 F, pus (white drainage) or increased drainage or redness at the wound, or calf pain, call your surgeon's office.   Constipation Prevention   Complete by:  As directed    Drink plenty of fluids.  Prune juice may be helpful.  You may use a stool softener, such as Colace (over the counter) 100 mg twice a day.  Use MiraLax (over the counter) for constipation as needed.   Diet - low sodium heart healthy   Complete by:  As directed    Discharge instructions   Complete by:  As directed    Use the CPM machine 1-1/2  hours 3 times a day No lifting with right arm Okay to shower dressing is waterproof   Increase activity slowly as tolerated   Complete by:  As directed       Follow-up Information    [facility] Follow up.   Specialty:  Home Health Services Why:  This is your home health agency.  They will contact you to arrange visits for Occupational and Physical therapy on Monday.   Contact information: [address] [place] [address] [phone]            Signed: Anderson Malta 01/27/2018, 9:51 AM

## 2018-02-02 ENCOUNTER — Inpatient Hospital Stay (INDEPENDENT_AMBULATORY_CARE_PROVIDER_SITE_OTHER): Payer: BLUE CROSS/BLUE SHIELD | Admitting: Orthopedic Surgery

## 2018-02-03 DIAGNOSIS — M19011 Primary osteoarthritis, right shoulder: Secondary | ICD-10-CM

## 2018-02-05 ENCOUNTER — Encounter (INDEPENDENT_AMBULATORY_CARE_PROVIDER_SITE_OTHER): Payer: Self-pay | Admitting: Orthopedic Surgery

## 2018-02-05 ENCOUNTER — Ambulatory Visit (INDEPENDENT_AMBULATORY_CARE_PROVIDER_SITE_OTHER): Payer: Self-pay

## 2018-02-05 ENCOUNTER — Ambulatory Visit (INDEPENDENT_AMBULATORY_CARE_PROVIDER_SITE_OTHER): Payer: BLUE CROSS/BLUE SHIELD | Admitting: Orthopedic Surgery

## 2018-02-05 DIAGNOSIS — M19011 Primary osteoarthritis, right shoulder: Secondary | ICD-10-CM | POA: Diagnosis not present

## 2018-02-05 MED ORDER — OXYCODONE-ACETAMINOPHEN 5-325 MG PO TABS: ORAL_TABLET | ORAL | 0 refills | Status: DC

## 2018-02-05 NOTE — Progress Notes (Signed)
Post-Op Visit Note   Patient: Stephen Maldonado           Date of Birth: January 13, 1956           MRN: [id number] Visit Date: 02/05/2018 PCP: Josetta Huddle, MD   Assessment & Plan:  Chief Complaint:  Chief Complaint  Patient presents with  . Right Shoulder - Routine Post Op   Visit Diagnoses:  1. Primary osteoarthritis, right shoulder     Plan: Stephen Maldonado is now about 2 weeks out right reverse shoulder replacement.  He is been doing well.  He is doing his own therapy.  On exam he has forward flexion abduction each to about close to 90 degrees.  Deltoid is functional.  Radiographs look good.  I will refill his oxycodone and see him back in 4 weeks.  He wants to continue to work on this his self.  I did tell him I only want him doing passive range of motion and active assisted range of motion exercises with that right arm.  No lifting.  Come back in 4 weeks for clinical recheck.  Follow-Up Instructions: Return in about 4 weeks (around 03/05/2018).   Orders:  Orders Placed This Encounter  Procedures  . XR Shoulder Right   No orders of the defined types were placed in this encounter.   Imaging: Xr Shoulder Right  Result Date: 02/05/2018 2 views right shoulder reviewed.  Reverse shoulder replacement in good position alignment with no complicating features.  Shoulder is located.   PMFS History: Patient Active Problem List   Diagnosis Date Noted  . Primary osteoarthritis, right shoulder   . Shoulder arthritis 01/23/2018  . Arthritis of right shoulder region 08/25/2017  . Chronic right shoulder pain 08/25/2017  . Total knee replacement status, left 01/11/2017  . Status post total left knee replacement 01/10/2017  . Chronic pain of left knee 11/04/2016  . Chronic bilateral low back pain with right-sided sciatica 11/04/2016  . Acute coronary syndrome ([place]) 07/10/2016  . Unilateral primary osteoarthritis, left knee 06/26/2016  . H/O total knee replacement, right 06/26/2016  . Unilateral  primary osteoarthritis, right knee 12/01/2015  . Status post total right knee replacement 12/01/2015   Past Medical History:  Diagnosis Date  . Arthritis    knees  . Bladder spasms   . BPH (benign prostatic hyperplasia)   . DVT (deep venous thrombosis) ([place]) ocyt 2016   left leg tx medically  . GERD (gastroesophageal reflux disease)   . History of colon polyps    hyperplastic 06-14-2010  . Hydronephrosis   . Hypertension   . Urinary retention     History reviewed. No pertinent family history.  Past Surgical History:  Procedure Laterality Date  . COLONOSCOPY  06/14/2010  . CORONARY ANGIOGRAPHY N/A 07/11/2016   Procedure: Coronary Angiography;  Surgeon: Dixie Dials, MD;  Location: [facility];  Service: Cardiovascular;  Laterality: N/A;  . CYSTOSCOPY W/ RETROGRADES Bilateral 10/03/2015   Procedure: CYSTOSCOPY WITH RETROGRADE PYELOGRAM;  Surgeon: [name], MD;  Location: [facility];  Service: Urology;  Laterality: Bilateral;  . GREEN LIGHT LASER TURP (TRANSURETHRAL RESECTION OF PROSTATE N/A 10/03/2015   Procedure: GREEN LIGHT LASER,  TURP - STAGED(TRANSURETHRAL RESECTION OF PROSTATE;  Surgeon: [name], MD;  [facility]: [facility];  Service: Urology;  Laterality: N/A;  . KNEE ARTHROSCOPY     not sure which knee  . QUADRICEPS TENDON REPAIR Bilateral left 02/24/2003/   right 2006  . TOTAL KNEE ARTHROPLASTY  Right 12/01/2015   Procedure: RIGHT TOTAL KNEE ARTHROPLASTY;  Surgeon: [name], MD;  Location: [facility];  Service: Orthopedics;  Laterality: Right;  . TOTAL KNEE ARTHROPLASTY Left 01/10/2017   Procedure: LEFT TOTAL KNEE ARTHROPLASTY;  Surgeon: [name], MD;  Location: [facility];  Service: Orthopedics;  Laterality: Left;  . TOTAL SHOULDER ARTHROPLASTY Right 01/23/2018   Procedure: RIGHT REVERSE TOTAL SHOULDER ARTHROPLASTY;  Surgeon: Meredith Pel, MD;  Location: [facility];  Service: Orthopedics;  Laterality:  Right;   Social History   Occupational History  . Not on file  Tobacco Use  . Smoking status: Never Smoker  . Smokeless tobacco: Former Systems developer    Types: Chew  Substance and Sexual Activity  . Alcohol use: Yes    Comment: 1-2 beers days  . Drug use: No  . Sexual activity: Not Currently

## 2018-02-05 NOTE — Addendum Note (Signed)
Addended by: Mal Misty L on: 02/05/2018 01:18 PM   Modules accepted: Orders

## 2018-02-16 IMAGING — DX DG KNEE 1-2V PORT*R*
2 series · 2 of 2 positions shown · non-contrast
Comparison: None.

CLINICAL DATA: Post total knee replacement.

EXAM:
PORTABLE RIGHT KNEE - 1-2 VIEW

[knee ap]
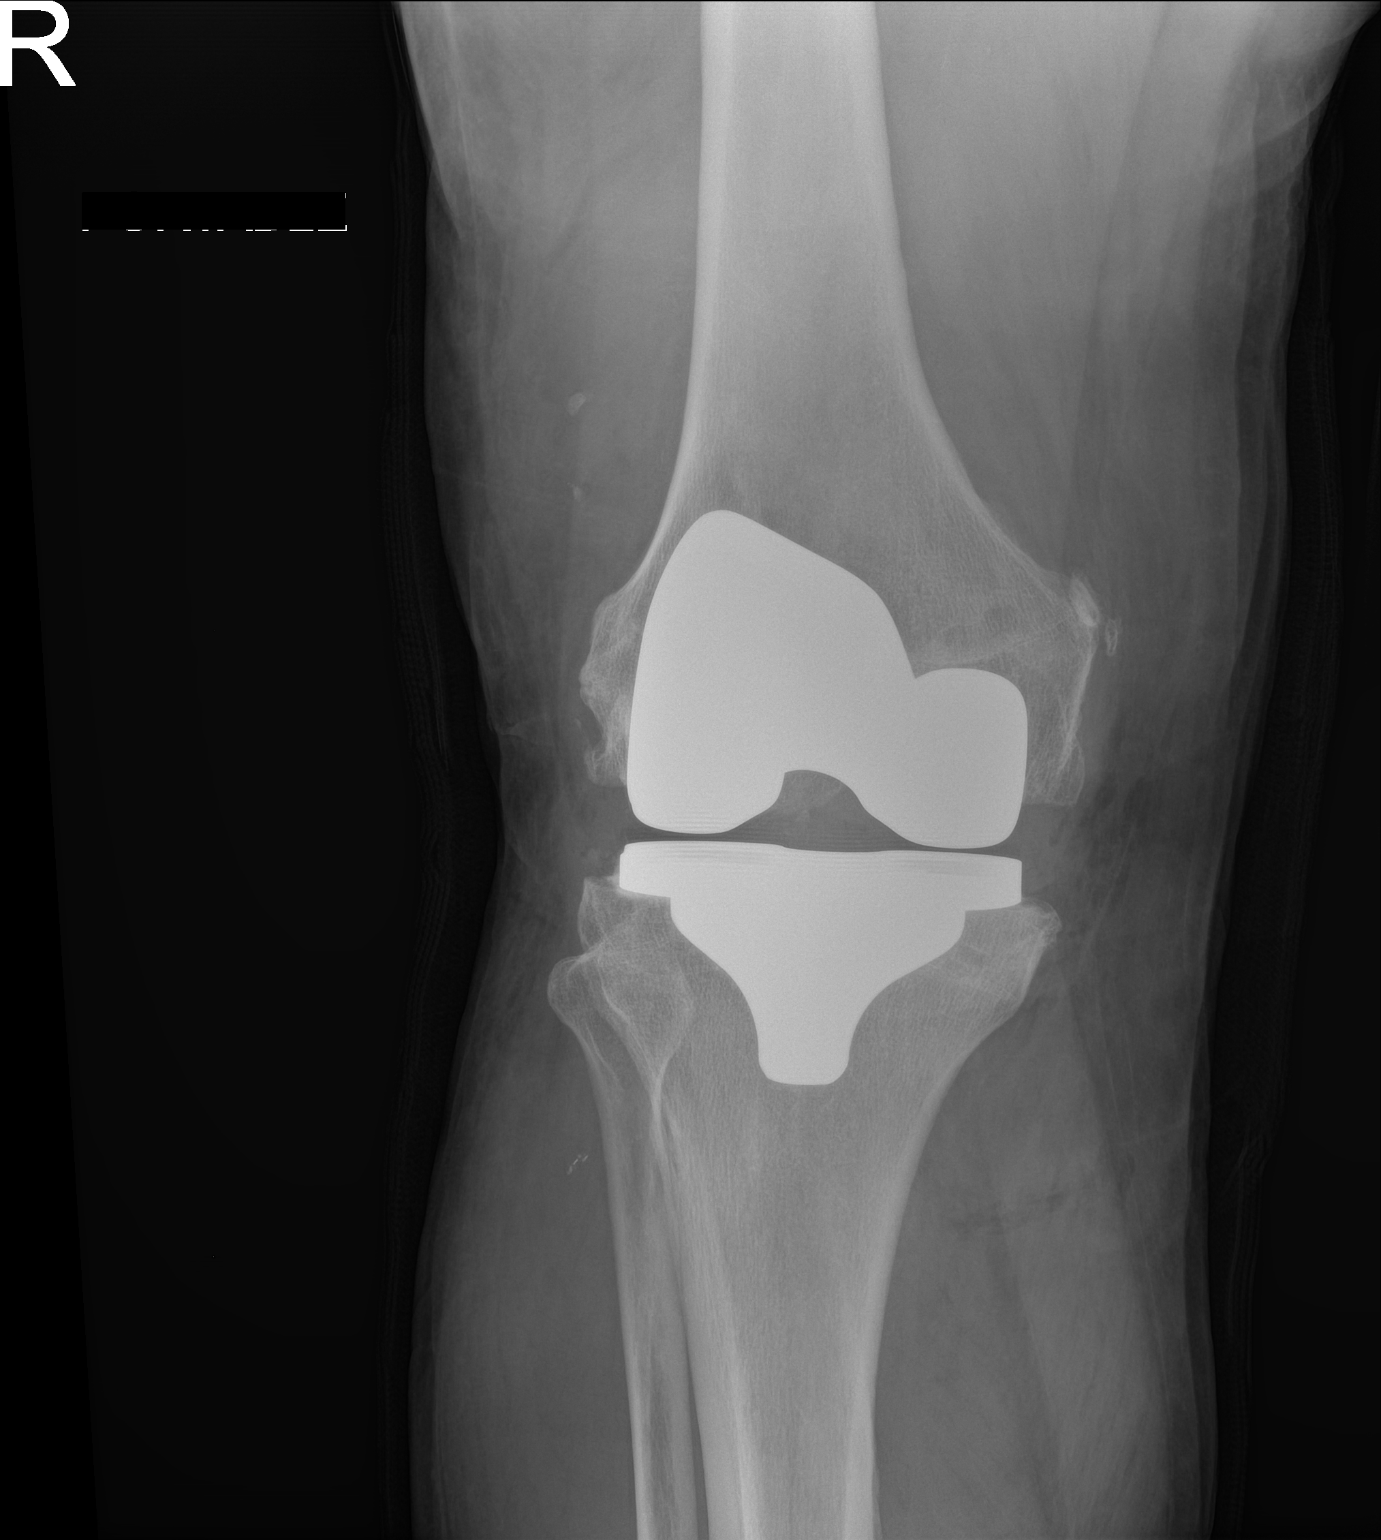

[knee lat]
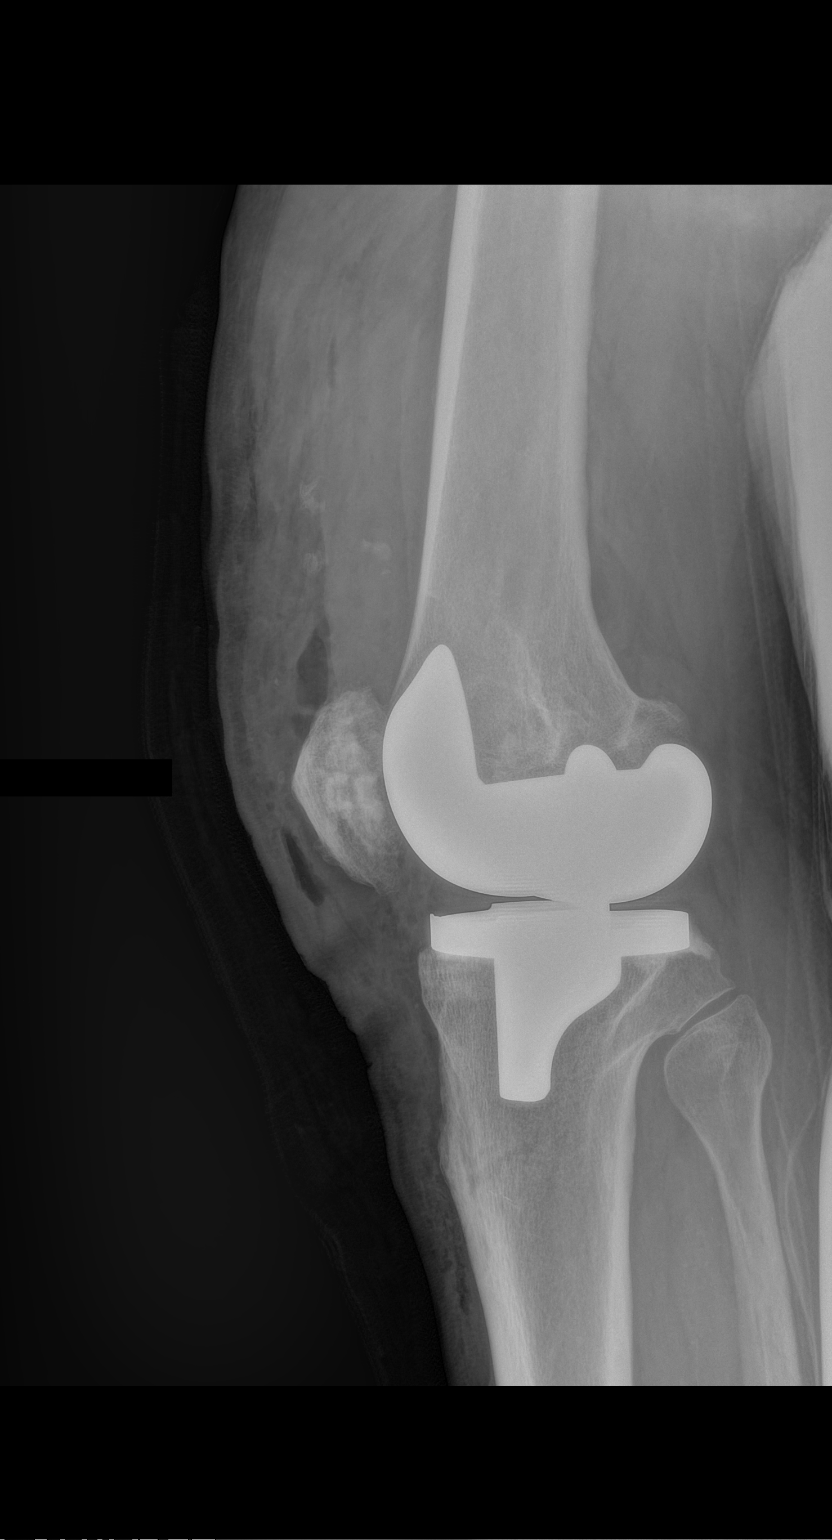

[2 of 2 positions shown; findings below may reference images not displayed]

FINDINGS: Post total knee replacement without evidence of hardware failure or
loosening. Alignment appears anatomic. There is a small expected
lipohemarthrosis and minimal amount of intra-articular air. Expected
minimal amount of adjacent subcutaneous edema and soft tissue
swelling. No radiopaque foreign body.
IMPRESSION: Post right total knee replacement without evidence of complication.

## 2018-03-04 ENCOUNTER — Ambulatory Visit (INDEPENDENT_AMBULATORY_CARE_PROVIDER_SITE_OTHER): Payer: BLUE CROSS/BLUE SHIELD | Admitting: Orthopedic Surgery

## 2018-03-04 ENCOUNTER — Encounter (INDEPENDENT_AMBULATORY_CARE_PROVIDER_SITE_OTHER): Payer: Self-pay | Admitting: Orthopedic Surgery

## 2018-03-04 DIAGNOSIS — M19011 Primary osteoarthritis, right shoulder: Secondary | ICD-10-CM

## 2018-03-04 NOTE — Progress Notes (Signed)
Post-Op Visit Note   Patient: Stephen Maldonado           Date of Birth: Aug 22, 1955           MRN: [id number] Visit Date: 03/04/2018 PCP: Josetta Huddle, MD   Assessment & Plan:  Chief Complaint:  Chief Complaint  Patient presents with  . Right Shoulder - Follow-up   Visit Diagnoses:  1. Primary osteoarthritis, right shoulder     Plan: [name] is a patient is now about 5 weeks out reverse replacement on the right.  He has forward flexion abduction both above 90 degrees.  Having little biceps pain in that biceps muscle is a little flaccid.  He has a little numbness in in the antecubital fossa region I think in general he is doing great.  He may have a little neuropraxia which is improving.  Reinforcements about lifting restrictions discussed.  I will see him back in 6 weeks for final check.  He is doing his own therapy.  And this is by choice.  Follow-Up Instructions: Return in about 6 weeks (around 04/15/2018).   Orders:  No orders of the defined types were placed in this encounter.  No orders of the defined types were placed in this encounter.   Imaging: No results found.  PMFS History: Patient Active Problem List   Diagnosis Date Noted  . Primary osteoarthritis, right shoulder   . Shoulder arthritis 01/23/2018  . Arthritis of right shoulder region 08/25/2017  . Chronic right shoulder pain 08/25/2017  . Total knee replacement status, left 01/11/2017  . Status post total left knee replacement 01/10/2017  . Chronic pain of left knee 11/04/2016  . Chronic bilateral low back pain with right-sided sciatica 11/04/2016  . Acute coronary syndrome ([place]) 07/10/2016  . Unilateral primary osteoarthritis, left knee 06/26/2016  . H/O total knee replacement, right 06/26/2016  . Unilateral primary osteoarthritis, right knee 12/01/2015  . Status post total right knee replacement 12/01/2015   Past Medical History:  Diagnosis Date  . Arthritis    knees  . Bladder spasms   . BPH  (benign prostatic hyperplasia)   . DVT (deep venous thrombosis) ([place]) ocyt 2016   left leg tx medically  . GERD (gastroesophageal reflux disease)   . History of colon polyps    hyperplastic 06-14-2010  . Hydronephrosis   . Hypertension   . Urinary retention     History reviewed. No pertinent family history.  Past Surgical History:  Procedure Laterality Date  . COLONOSCOPY  06/14/2010  . CORONARY ANGIOGRAPHY N/A 07/11/2016   Procedure: Coronary Angiography;  Surgeon: Dixie Dials, MD;  Location: [facility];  Service: Cardiovascular;  Laterality: N/A;  . CYSTOSCOPY W/ RETROGRADES Bilateral 10/03/2015   Procedure: CYSTOSCOPY WITH RETROGRADE PYELOGRAM;  Surgeon: [name], MD;  Location[facility];  Service: Urology;  Laterality: Bilateral;  . GREEN LIGHT LASER TURP (TRANSURETHRAL RESECTION OF PROSTATE N/A 10/03/2015   Procedure: GREEN LIGHT LASER,  TURP - STAGED(TRANSURETHRAL RESECTION OF PROSTATE;  Surgeon: [name], MD;  Location: [facility];  Service: Urology;  Laterality: N/A;  . KNEE ARTHROSCOPY     not sure which knee  . QUADRICEPS TENDON REPAIR Bilateral left 02/24/2003/   right 2006  . TOTAL KNEE ARTHROPLASTY Right 12/01/2015   Procedure: RIGHT TOTAL KNEE ARTHROPLASTY;  Surgeon: [name], MD;  Location: [facility];  Service: Orthopedics;  Laterality: Right;  . TOTAL KNEE ARTHROPLASTY Left 01/10/2017   Procedure: LEFT TOTAL KNEE  ARTHROPLASTY;  Surgeon: [name], MD;  Location: [facility];  Service: Orthopedics;  Laterality: Left;  . TOTAL SHOULDER ARTHROPLASTY Right 01/23/2018   Procedure: RIGHT REVERSE TOTAL SHOULDER ARTHROPLASTY;  Surgeon: Meredith Pel, MD;  Location: [facility];  Service: Orthopedics;  Laterality: Right;   Social History   Occupational History  . Not on file  Tobacco Use  . Smoking status: Never Smoker  . Smokeless tobacco: Former Systems developer    Types: Chew  Substance and Sexual Activity    . Alcohol use: Yes    Comment: 1-2 beers days  . Drug use: No  . Sexual activity: Not Currently

## 2018-03-09 ENCOUNTER — Telehealth (INDEPENDENT_AMBULATORY_CARE_PROVIDER_SITE_OTHER): Payer: Self-pay | Admitting: Orthopedic Surgery

## 2018-03-09 NOTE — Telephone Encounter (Signed)
See below

## 2018-03-09 NOTE — Telephone Encounter (Signed)
Patient's spouse Manuela Schwartz called and stated patient was denied [employer]. The reason on forms was put on there was for arthritis. They were under the impression it was a injury to shoulder and not arthritis.  They felt that it was torn and worn not just arthritis.  Please call spouse Manuela Schwartz [phone]

## 2018-03-10 NOTE — Telephone Encounter (Signed)
The entire medical record is consistent with a diagnosis of arthritis and not an acute injury.  This is arthritis.  Not an acute injury.

## 2018-03-10 NOTE — Telephone Encounter (Signed)
Patient wife aware of the below message  °

## 2018-03-12 ENCOUNTER — Other Ambulatory Visit: Payer: Self-pay | Admitting: Internal Medicine

## 2018-03-12 DIAGNOSIS — M5416 Radiculopathy, lumbar region: Secondary | ICD-10-CM | POA: Diagnosis not present

## 2018-03-12 DIAGNOSIS — R05 Cough: Secondary | ICD-10-CM | POA: Diagnosis not present

## 2018-03-24 DIAGNOSIS — M545 Low back pain: Secondary | ICD-10-CM | POA: Diagnosis not present

## 2018-03-24 DIAGNOSIS — Z6841 Body Mass Index (BMI) 40.0 and over, adult: Secondary | ICD-10-CM | POA: Diagnosis not present

## 2018-04-15 ENCOUNTER — Ambulatory Visit (INDEPENDENT_AMBULATORY_CARE_PROVIDER_SITE_OTHER): Payer: BLUE CROSS/BLUE SHIELD | Admitting: Orthopedic Surgery

## 2018-04-22 ENCOUNTER — Ambulatory Visit (INDEPENDENT_AMBULATORY_CARE_PROVIDER_SITE_OTHER): Payer: BLUE CROSS/BLUE SHIELD | Admitting: Orthopedic Surgery

## 2018-07-01 ENCOUNTER — Ambulatory Visit (INDEPENDENT_AMBULATORY_CARE_PROVIDER_SITE_OTHER): Payer: BC Managed Care – PPO | Admitting: Cardiology

## 2018-07-01 ENCOUNTER — Ambulatory Visit
Admission: RE | Admit: 2018-07-01 | Discharge: 2018-07-01 | Disposition: A | Discharge status: Home / Self Care | Payer: BLUE CROSS/BLUE SHIELD | Source: Ambulatory Visit | Attending: Internal Medicine | Admitting: Internal Medicine

## 2018-07-01 ENCOUNTER — Other Ambulatory Visit: Payer: Self-pay

## 2018-07-01 ENCOUNTER — Other Ambulatory Visit: Payer: Self-pay | Admitting: Internal Medicine

## 2018-07-01 ENCOUNTER — Encounter: Payer: Self-pay | Admitting: Cardiology

## 2018-07-01 VITALS — BP 163/92 | HR 90 | Temp 97.7°F | Ht 71.0 in | Wt 338.4 lb

## 2018-07-01 DIAGNOSIS — R0609 Other forms of dyspnea: Secondary | ICD-10-CM

## 2018-07-01 DIAGNOSIS — I480 Paroxysmal atrial fibrillation: Secondary | ICD-10-CM | POA: Insufficient documentation

## 2018-07-01 DIAGNOSIS — R0600 Dyspnea, unspecified: Secondary | ICD-10-CM

## 2018-07-01 DIAGNOSIS — I38 Endocarditis, valve unspecified: Secondary | ICD-10-CM

## 2018-07-01 DIAGNOSIS — I10 Essential (primary) hypertension: Secondary | ICD-10-CM | POA: Diagnosis not present

## 2018-07-01 DIAGNOSIS — Z8249 Family history of ischemic heart disease and other diseases of the circulatory system: Secondary | ICD-10-CM | POA: Diagnosis not present

## 2018-07-01 DIAGNOSIS — I4891 Unspecified atrial fibrillation: Secondary | ICD-10-CM

## 2018-07-01 DIAGNOSIS — R0602 Shortness of breath: Secondary | ICD-10-CM

## 2018-07-01 DIAGNOSIS — I509 Heart failure, unspecified: Secondary | ICD-10-CM

## 2018-07-01 NOTE — Patient Instructions (Signed)
Heart Failure Eating Plan °Heart failure, also called congestive heart failure, occurs when your heart does not pump blood well enough to meet your body's needs for oxygen-rich blood. Heart failure is a long-term (chronic) condition. Living with heart failure can be challenging. However, following your health care provider's instructions about a healthy lifestyle and working with a diet and nutrition specialist (dietitian) to choose the right foods may help to improve your symptoms. °What are tips for following this plan? °General guidelines °· Do not eat more than 2,300 mg of salt (sodium) a day. The amount of sodium that is recommended for you may be lower, depending on your condition. °· Maintain a healthy body weight as directed. Ask your health care provider what a healthy weight is for you. °? Check your weight every day. °? Work with your health care provider and dietitian to make a plan that is right for you to lose weight or maintain your current weight. °· Limit how much fluid you drink. Ask your health care provider or dietitian how much fluid you can have each day. °· Limit or avoid alcohol as told by your health care provider or dietitian. °Reading food labels °· Check food labels for the amount of sodium per serving. Choose foods that have less than 140 mg (milligrams) of sodium in each serving. °· Check food labels for the number of calories per serving. This is important if you need to limit your daily calorie intake to lose weight. °· Check food labels for the serving size. If you eat more than one serving, you will be eating more sodium and calories than what is listed on the label. °· Look for foods that are labeled as "sodium-free," "very low sodium," or "low sodium." °? Foods labeled as "reduced sodium" or "lightly salted" may still have more sodium than what is recommended for you. °Cooking °· Avoid adding salt when cooking. Ask your health care provider or dietitian before using salt  substitutes. °· Season food with salt-free seasonings, spices, or herbs. Check the label of seasoning mixes to make sure they do not contain salt. °· Cook with heart-healthy oils, such as olive, canola, soybean, or sunflower oil. °· Do not fry foods. Cook foods using low-fat methods, such as baking, boiling, grilling, and broiling. °· Limit unhealthy fats when cooking by: °? Removing the skin from poultry, such as chicken. °? Removing all visible fats from meats. °? Skimming the fat off from stews, soups, and gravies before serving them. °Meal planning ° °· Limit your intake of: °? Processed, canned, or pre-packaged foods. °? Foods that are high in trans fat, such as fried foods. °? Sweets, desserts, sugary drinks, and other foods with added sugar. °? Full-fat dairy products, such as whole milk. °· Eat a balanced diet that includes: °? 4-5 servings of fruit each day and 4-5 servings of vegetables each day. At each meal, try to fill half of your plate with fruits and vegetables. °? Up to 6-8 servings of whole grains each day. °? Up to 2 servings of lean meat, poultry, or fish each day. One serving of meat is equal to 3 oz. This is about the same size as a deck of cards. °? 2 servings of low-fat dairy each day. °? Heart-healthy fats. Healthy fats called omega-3 fatty acids are found in foods such as flaxseed and cold-water fish like sardines, salmon, and mackerel. °· Aim to eat 25-35 g (grams) of fiber a day. Foods that are high in fiber include   apples, broccoli, carrots, beans, peas, and whole grains. °· Do not add salt or condiments that contain salt (such as soy sauce) to foods before eating. °· When eating at a restaurant, ask that your food be prepared with less salt or no salt, if possible. °· Try to eat 2 or more vegetarian meals each week. °· Eat more home-cooked food and eat less restaurant, buffet, and fast food. °Recommended foods °The items listed may not be a complete list. Talk with your dietitian about  what dietary choices are best for you. °Grains °Bread with less than 80 mg of sodium per slice. Whole-wheat pasta, quinoa, and brown rice. Oats and oatmeal. Barley. Millet. Grits and cream of wheat. Whole-grain and whole-wheat cold cereal. °Vegetables °All fresh vegetables. Vegetables that are frozen without sauce or added salt. Low-sodium or sodium-free canned vegetables. °Fruits °All fresh, frozen, and canned fruits. Dried fruits, such as raisins, prunes, and cranberries. °Meats and other protein foods °Lean cuts of meat. Skinless chicken and turkey. Fish with high omega-3 fatty acids, such as salmon, sardines, and other cold-water fishes. Eggs. Dried beans, peas, and edamame. Unsalted nuts and nut butters. °Dairy °Low-fat or nonfat (skim) milk and dried milk. Rice milk, soy milk, and almond milk. Low-fat or nonfat yogurt. Small amounts of reduced-sodium block cheese. Low-sodium cottage cheese. °Fats and oils °Olive, canola, soybean, flaxseed, or sunflower oil. Avocado. °Sweets and desserts °Apple sauce. Granola bars. Sugar-free pudding and gelatin. Frozen fruit bars. °Seasoning and other foods °Fresh and dried herbs. Lemon or lime juice. Vinegar. Low-sodium ketchup. Salt-free marinades, salad dressings, sauces, and seasonings. °Foods to avoid °The items listed may not be a complete list. Talk with your dietitian about what dietary choices are best for you. °Grains °Bread with more than 80 mg of sodium per slice. Hot or cold cereal with more than 140 mg sodium per serving. Salted pretzels and crackers. Pre-packaged breadcrumbs. Bagels, croissants, and biscuits. °Vegetables °Canned vegetables. Frozen vegetables with sauce or seasonings. Creamed vegetables. French fries. Onion rings. Pickled vegetables and sauerkraut. °Fruits °Fruits that are dried with sodium-containing preservatives. °Meats and other protein foods °Ribs and chicken wings. Bacon, ham, pepperoni, bologna, salami, and packaged luncheon meats. Hot  dogs, bratwurst, and sausage. Canned meat. Smoked meat and fish. Salted nuts and seeds. °Dairy °Whole milk, half-and-half, and cream. Buttermilk. Processed cheese, cheese spreads, and cheese curds. Regular cottage cheese. Feta cheese. Shredded cheese. String cheese. °Fats and oils °Butter, lard, shortening, ghee, and bacon fat. Canned and packaged gravies. °Seasoning and other foods °Onion salt, garlic salt, table salt, and sea salt. Marinades. Regular salad dressings. Relishes, pickles, and olives. Meat flavorings and tenderizers, and bouillon cubes. Horseradish, ketchup, and mustard. Worcestershire sauce. Teriyaki sauce, soy sauce (including reduced sodium). Hot sauce and Tabasco sauce. Steak sauce, fish sauce, oyster sauce, and cocktail sauce. Taco seasonings. Barbecue sauce. Tartar sauce. °Summary °· A heart failure eating plan includes changes that limit your intake of sodium and unhealthy fat, and it may help you lose weight or maintain a healthy weight. Your health care provider may also recommend limiting how much fluid you drink. °· Most people with heart failure should eat no more than 2,300 mg of salt (sodium) a day. The amount of sodium that is recommended for you may be lower, depending on your condition. °· Contact your health care provider or dietitian before making any major changes to your diet. °This information is not intended to replace advice given to you by your health care provider. Make sure   you discuss any questions you have with your health care provider. °Document Released: 06/07/2016 Document Revised: 06/07/2016 Document Reviewed: 06/07/2016 °Elsevier Interactive Patient Education © 2019 [employer] ° °

## 2018-07-01 NOTE — Progress Notes (Signed)
Patient referred by Josetta Huddle, MD for atrial fibrillation, heart failure.  Subjective:   Stephen Maldonado, male    DOB: January 12, 1956, 63 y.o.   MRN: [id number]   Chief Complaint  Patient presents with  . New Patient (Initial Visit)  . Atrial Fibrillation  . Shortness of Breath    HPI  63 y.o. Caucasian male with hypertension, hyperlipidemia, morbid obesity, referred for management of atrial fibrillation, heart failure.  Patient lives in [place], [name] a farm. 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 He has had the following symptoms for a long time, with recent worsening. This includes shortness of breath with minimal activity, bilateral leg swelling, orthopnea. He does not particularly have exertional chest pain/pressure symptoms, but does endorse heaviness when he lays flat at night.   Patient was seen by PCP Dr. Inda Merlin today and was found to have Afib with controlled ventricular rate. Given his suspected heart failure, his lasix was increased to 80 mg daily by Dr. Inda Merlin. He was also started on Xarelto.  Patient endorses snoring at night. Sleep apnea has been initiated by Dr. Inda Merlin' office. His diet includes heavy salt intake.   Past Medical History:  Diagnosis Date  . Arthritis    knees  . Bladder spasms   . BPH (benign prostatic hyperplasia)   . DVT (deep venous thrombosis) ([place]) ocyt 2016   left leg tx medically  . GERD (gastroesophageal reflux disease)   . History of colon polyps    hyperplastic 06-14-2010  . Hydronephrosis   . Hypertension   . Urinary retention      Past Surgical History:  Procedure Laterality Date  . COLONOSCOPY  06/14/2010  . CORONARY ANGIOGRAPHY N/A 07/11/2016   Procedure: Coronary Angiography;  Surgeon: Dixie Dials, MD;  Location: [facility];  Service: Cardiovascular;  Laterality: N/A;  . CYSTOSCOPY W/ RETROGRADES Bilateral 10/03/2015   Procedure: CYSTOSCOPY WITH  RETROGRADE PYELOGRAM;  Surgeon: [name], MD;  Location: [facility];  Service: Urology;  Laterality: Bilateral;  . GREEN LIGHT LASER TURP (TRANSURETHRAL RESECTION OF PROSTATE N/A 10/03/2015   Procedure: GREEN LIGHT LASER,  TURP - STAGED(TRANSURETHRAL RESECTION OF PROSTATE;  Surgeon: [name], MD;  Location: [facility];  Service: Urology;  Laterality: N/A;  . KNEE ARTHROSCOPY     not sure which knee  . QUADRICEPS TENDON REPAIR Bilateral left 02/24/2003/   right 2006  . TOTAL KNEE ARTHROPLASTY Right 12/01/2015   Procedure: RIGHT TOTAL KNEE ARTHROPLASTY;  Surgeon: [name], MD;  Location: [facility];  Service: Orthopedics;  Laterality: Right;  . TOTAL KNEE ARTHROPLASTY Left 01/10/2017   Procedure: LEFT TOTAL KNEE ARTHROPLASTY;  Surgeon: [name], MD;  Location: [facility];  Service: Orthopedics;  Laterality: Left;  . TOTAL SHOULDER ARTHROPLASTY Right 01/23/2018   Procedure: RIGHT REVERSE TOTAL SHOULDER ARTHROPLASTY;  Surgeon: Meredith Pel, MD;  Location: [facility];  Service: Orthopedics;  Laterality: Right;     Social History   Socioeconomic History  . Marital status: Married    Spouse name: Not on file  . Number of children: Not on file  . Years of education: Not on file  . Highest education level: Not on file  Occupational History  . Not on file  Social Needs  . Financial resource strain: Not on file  . Food insecurity:    Worry: Not on file    Inability: Not on file  .  Transportation needs:    Medical: Not on file    Non-medical: Not on file  Tobacco Use  . Smoking status: Never Smoker  . Smokeless tobacco: Former Systems developer    Types: Chew  Substance and Sexual Activity  . Alcohol use: Yes    Comment: 1-2 beers days  . Drug use: No  . Sexual activity: Not Currently  Lifestyle  . Physical activity:    Days per week: Not on file    Minutes per session: Not on file  . Stress: Not on file  Relationships  .  Social connections:    Talks on phone: Not on file    Gets together: Not on file    Attends religious service: Not on file    Active member of club or organization: Not on file    Attends meetings of clubs or organizations: Not on file    Relationship status: Not on file  . Intimate partner violence:    Fear of current or ex partner: Not on file    Emotionally abused: Not on file    Physically abused: Not on file    Forced sexual activity: Not on file  Other Topics Concern  . Not on file  Social History Narrative  . Not on file     Family History  Problem Relation Age of Onset  . CAD Mother   . Lung cancer Mother   . Breast cancer Mother   . Diabetes Mother   . Heart failure Father   . CAD Father   . Diabetes Father     Current Outpatient Medications on File Prior to Visit  Medication Sig Dispense Refill  . acetaminophen (TYLENOL) 325 MG tablet Take 1-2 tablets (325-650 mg total) by mouth every 6 (six) hours as needed for mild pain (pain score 1-3 or temp > 100.5). 30 tablet 0  . atorvastatin (LIPITOR) 40 MG tablet Take 1 tablet (40 mg total) by mouth daily at 6 PM. (Patient not taking: Reported on 01/03/2017) 30 tablet 3  . Cholecalciferol (VITAMIN D-3 PO) Take 2,000 Units by mouth daily.     . Coenzyme Q10 (COQ10) 100 MG CAPS Take 100 mg by mouth daily.     Marland Kitchen diltiazem (CARDIZEM CD) 240 MG 24 hr capsule Take 240 mg by mouth daily.  5  . diphenhydrAMINE (BENADRYL) 25 MG tablet Take 25 mg by mouth at bedtime.    . furosemide (LASIX) 20 MG tablet Take 20-40 mg by mouth See admin instructions. Take 20 mg by mouth every other day alternating with 40 mg by mouth every other day  0  . Garlic 9449 MG CAPS Take 1,000 mg by mouth daily.    . hydrALAZINE (APRESOLINE) 25 MG tablet Take 25 mg by mouth 2 (two) times daily with a meal.  3  . Lidocaine-Menthol (ICY HOT LIDOCAINE PLUS MENTHOL EX) Apply 1 application topically as needed (for back pain).    . metoprolol tartrate (LOPRESSOR)  25 MG tablet Take 0.5 tablets (12.5 mg total) by mouth 2 (two) times daily. 30 tablet 3  . nitroGLYCERIN (NITROSTAT) 0.4 MG SL tablet Place 1 tablet (0.4 mg total) under the tongue every 5 (five) minutes x 3 doses as needed for chest pain. 25 tablet 1  . oxyCODONE (OXY IR/ROXICODONE) 5 MG immediate release tablet Take 2 tablets (10 mg total) by mouth every 4 (four) hours as needed for moderate pain (pain score 4-6). 42 tablet 0  . oxyCODONE-acetaminophen (PERCOCET/ROXICET) 5-325 MG tablet 1 tablet  by mouth every 8-12 hours PRN 30 tablet 0  . rivaroxaban (XARELTO) 10 MG TABS tablet Take 1 tablet (10 mg total) by mouth at bedtime. 14 tablet 0  . tamsulosin (FLOMAX) 0.4 MG CAPS capsule Take 0.4 mg by mouth every morning.    Marland Kitchen tiZANidine (ZANAFLEX) 4 MG tablet TAKE 1 TABLET (4 MG TOTAL) BY MOUTH EVERY 8 (EIGHT) HOURS AS NEEDED FOR MUSCLE SPASMS. 60 tablet 0  . Turmeric 500 MG TABS Take 500 mg by mouth daily.    . vitamin B-12 (CYANOCOBALAMIN) 1000 MCG tablet Take 1,000 mcg by mouth daily.    . vitamin C (ASCORBIC ACID) 500 MG tablet Take 500 mg by mouth daily.      No current facility-administered medications on file prior to visit.     Cardiovascular studies:  EKG 07/01/2018: Afib with controlled ventricular rate  Recent labs: Pending  Review of Systems  Constitution: Negative for decreased appetite, malaise/fatigue, weight gain and weight loss.       Snoring  HENT: Negative for congestion.   Eyes: Negative for visual disturbance.  Cardiovascular: Positive for dyspnea on exertion, leg swelling and orthopnea. Negative for chest pain, palpitations and syncope.  Respiratory: Positive for shortness of breath. Negative for cough.   Endocrine: Negative for cold intolerance.  Hematologic/Lymphatic: Does not bruise/bleed easily.  Skin: Negative for itching and rash.  Musculoskeletal: Negative for myalgias.  Gastrointestinal: Negative for abdominal pain, nausea and vomiting.  Genitourinary:  Negative for dysuria.  Neurological: Negative for dizziness and weakness.  Psychiatric/Behavioral: The patient is not nervous/anxious.   All other systems reviewed and are negative.        Vitals:   07/01/18 1431  BP: (!) 163/92  Pulse: 90  Temp: 97.7 F (36.5 C)  SpO2: 92%     Body mass index is 47.2 kg/m. Filed Weights   07/01/18 1431  Weight: (!) 153.5 kg    Objective:   Physical Exam  Constitutional: He is oriented to person, place, and time. He appears well-developed and well-nourished. No distress.  Morbidly obese   HENT:  Head: Normocephalic and atraumatic.  Eyes: Pupils are equal, round, and reactive to light. Conjunctivae are normal.  Neck: No JVD present.  Cardiovascular: Intact distal pulses. An irregularly irregular rhythm present. Tachycardia present.  Murmur heard. High-pitched blowing holosystolic murmur is present with a grade of 3/6 at the apex. Pulmonary/Chest: Breath sounds normal. Tachypnea noted. He has no wheezes.  Abdominal: Soft. Bowel sounds are normal. There is no rebound.  Musculoskeletal:        General: Edema (2+ b/l) present.  Lymphadenopathy:    He has no cervical adenopathy.  Neurological: He is alert and oriented to person, place, and time. No cranial nerve deficit.  Skin: Skin is warm and dry.  Psychiatric: He has a normal mood and affect.  Nursing note and vitals reviewed.         Assessment & Recommendations:   63 y.o. Caucasian male with hypertension, hyperlipidemia, morbid obesity, referred for management of atrial fibrillation, heart failure.  1. Paroxysmal atrial fibrillation (HCC) Rate controlled. Although diagnosed today, I suspect this may have been present for a long time.  CHA2DS2VASc score 3, annual stroke risk 3%. Conitnue Xarelto 20 mg daily Agree with diltiazem and metoprolol at this time. Even if he has low EF, his BP is actually high, so reasonable to continue diltiazem. After treating heart failure, may  consider rhythm control therapy for Afib.   2. Essential hypertension Uncontrolled. Lasix  increased toady by Dr.Gates. If BMP would allow, would add spironolactone. Will obtain labs from Dr. Inda Merlin' office.   3. Heart failure due to valvular disease, unspecified heart failure type [facility]): Acute on chronic. Agree with increased diuretic therapy. I gave the patient the option of hospital admission for diuresis, but he is reluctant to do so at this time. I have encouraged him to call [phone] if his symptoms were to worsen.  Will obtain echocardiogram next week. I had a long discussion with the patient regarding diet and lifestyle modifications. I encouraged him to restrict salt and calorie intake, and check weights regular.Depending on the echocardiogram findings, he may need ischemic workup down the road.   4. Morbid obesity ([place]) Along with suspected OSA, this is a significant risk factor for Afib and heart failure. Discussion as above.   I will see him next week after echocardiogram.   Thank you for referring the patient to [place]. Please feel free to contact with any questions.  Nigel Mormon, MD [facility] Pager: [phone] Office: [phone] If no answer Cell [phone]

## 2018-07-06 ENCOUNTER — Ambulatory Visit: Payer: BC Managed Care – PPO

## 2018-07-06 ENCOUNTER — Encounter: Payer: Self-pay | Admitting: Cardiology

## 2018-07-06 ENCOUNTER — Ambulatory Visit (INDEPENDENT_AMBULATORY_CARE_PROVIDER_SITE_OTHER): Payer: BC Managed Care – PPO | Admitting: Cardiology

## 2018-07-06 ENCOUNTER — Other Ambulatory Visit: Payer: Self-pay

## 2018-07-06 VITALS — BP 142/87 | HR 103 | Ht 71.0 in | Wt 325.0 lb

## 2018-07-06 DIAGNOSIS — I5023 Acute on chronic systolic (congestive) heart failure: Secondary | ICD-10-CM | POA: Diagnosis not present

## 2018-07-06 DIAGNOSIS — I4819 Other persistent atrial fibrillation: Secondary | ICD-10-CM

## 2018-07-06 DIAGNOSIS — I480 Paroxysmal atrial fibrillation: Secondary | ICD-10-CM

## 2018-07-06 DIAGNOSIS — I5022 Chronic systolic (congestive) heart failure: Secondary | ICD-10-CM | POA: Insufficient documentation

## 2018-07-06 DIAGNOSIS — I10 Essential (primary) hypertension: Secondary | ICD-10-CM | POA: Diagnosis not present

## 2018-07-06 MED ORDER — CARVEDILOL 3.125 MG PO TABS: 3.1250 mg | ORAL_TABLET | Freq: Two times a day (BID) | ORAL | 1 refills | Status: DC

## 2018-07-06 NOTE — Progress Notes (Signed)
Follow up visit  Subjective:   Stephen Maldonado, male    DOB: 1955-09-15, 63 y.o.   MRN: [id number]   Chief Complaint  Patient presents with   Atrial Fibrillation   Follow-up    HPI  63 y.o. Caucasian male with hypertension, hyperlipidemia, morbid obesity, referred for management of atrial fibrillation, heart failure.  At last visit, I continued lasix 80 mg daily, along with diltiazem 240 mg daily, metoprolol 12.5 mg bid. Patient has lost nearly 6 Kg wt since last visit with some improvement in shortness of breath and leg edema.  Further chart review today shows that patient underwent cardiac workup by Dr. Dixie Dials for chest pain that showed nonobstructive CAD, and normal EF on echocardiogram. His Cr has stayed relatively stable around 1.9-2.0.     Past Medical History:  Diagnosis Date   Arthritis    knees   Bladder spasms    BPH (benign prostatic hyperplasia)    DVT (deep venous thrombosis) ([place]) ocyt 2016   left leg tx medically   GERD (gastroesophageal reflux disease)    History of colon polyps    hyperplastic 06-14-2010   Hydronephrosis    Hypertension    Obesity, morbid ([place])    Urinary retention      Past Surgical History:  Procedure Laterality Date   COLONOSCOPY  06/14/2010   CORONARY ANGIOGRAPHY N/A 07/11/2016   Procedure: Coronary Angiography;  Surgeon: Dixie Dials, MD;  Location: [facility];  Service: Cardiovascular;  Laterality: N/A;   CYSTOSCOPY W/ RETROGRADES Bilateral 10/03/2015   Procedure: CYSTOSCOPY WITH RETROGRADE PYELOGRAM;  Surgeon: [name], MD;  Location: [facility];  Service: Urology;  Laterality: Bilateral;   GREEN LIGHT LASER TURP (TRANSURETHRAL RESECTION OF PROSTATE N/A 10/03/2015   Procedure: GREEN LIGHT LASER,  TURP - STAGED(TRANSURETHRAL RESECTION OF PROSTATE;  Surgeon: [name], MD;  Location: [facility];  Service: Urology;  Laterality: N/A;   KNEE ARTHROSCOPY       not sure which knee   QUADRICEPS TENDON REPAIR Bilateral left 02/24/2003/   right 2006   TOTAL KNEE ARTHROPLASTY Right 12/01/2015   Procedure: RIGHT TOTAL KNEE ARTHROPLASTY;  Surgeon: [name], MD;  Location: [facility];  Service: Orthopedics;  Laterality: Right;   TOTAL KNEE ARTHROPLASTY Left 01/10/2017   Procedure: LEFT TOTAL KNEE ARTHROPLASTY;  Surgeon: [name], MD;  Location: [facility];  Service: Orthopedics;  Laterality: Left;   TOTAL SHOULDER ARTHROPLASTY Right 01/23/2018   Procedure: RIGHT REVERSE TOTAL SHOULDER ARTHROPLASTY;  Surgeon: Meredith Pel, MD;  Location: [place];  Service: Orthopedics;  Laterality: Right;     Social History   Socioeconomic History   Marital status: Married    Spouse name: Not on file   Number of children: 1   Years of education: Not on file   Highest education level: Not on file  Occupational History   Not on file  Social Needs   Financial resource strain: Not on file   Food insecurity:    Worry: Not on file    Inability: Not on file   Transportation needs:    Medical: Not on file    Non-medical: Not on file  Tobacco Use   Smoking status: Never Smoker   Smokeless tobacco: Former Systems developer    Types: Chew  Substance and Sexual Activity   Alcohol use: Yes    Comment: 1-2 beers days   Drug use: No   Sexual activity: Not Currently  Lifestyle  Physical activity:    Days per week: Not on file    Minutes per session: Not on file   Stress: Not on file  Relationships   Social connections:    Talks on phone: Not on file    Gets together: Not on file    Attends religious service: Not on file    Active member of club or organization: Not on file    Attends meetings of clubs or organizations: Not on file    Relationship status: Not on file   Intimate partner violence:    Fear of current or ex partner: Not on file    Emotionally abused: Not on file    Physically abused: Not on file    Forced  sexual activity: Not on file  Other Topics Concern   Not on file  Social History Narrative   Not on file     Family History  Problem Relation Age of Onset   CAD Mother    Lung cancer Mother    Breast cancer Mother    Diabetes Mother    Heart failure Father    CAD Father    Diabetes Father      Current Outpatient Medications on File Prior to Visit  Medication Sig Dispense Refill   acetaminophen (TYLENOL) 325 MG tablet Take 1-2 tablets (325-650 mg total) by mouth every 6 (six) hours as needed for mild pain (pain score 1-3 or temp > 100.5). 30 tablet 0   Cholecalciferol (VITAMIN D-3 PO) Take 2,000 Units by mouth daily.      Coenzyme Q10 (COQ10) 100 MG CAPS Take 100 mg by mouth daily.      cyclobenzaprine (FLEXERIL) 10 MG tablet Take 10 mg by mouth as needed for muscle spasms.     diltiazem (CARDIZEM CD) 240 MG 24 hr capsule Take 240 mg by mouth daily.  5   diphenhydrAMINE (BENADRYL) 25 MG tablet Take 25 mg by mouth at bedtime as needed.      furosemide (LASIX) 20 MG tablet Take 80 mg by mouth daily.   0   Garlic 4268 MG CAPS Take 1,000 mg by mouth daily.     hydrALAZINE (APRESOLINE) 25 MG tablet Take 25 mg by mouth 2 (two) times daily with a meal.  3   Lidocaine-Menthol (ICY HOT LIDOCAINE PLUS MENTHOL EX) Apply 1 application topically as needed (for back pain).     metoprolol tartrate (LOPRESSOR) 25 MG tablet Take 0.5 tablets (12.5 mg total) by mouth 2 (two) times daily. 30 tablet 3   Omega-3 Fatty Acids (FISH OIL) 1000 MG CAPS Take by mouth daily.     oxyCODONE (OXY IR/ROXICODONE) 5 MG immediate release tablet Take 2 tablets (10 mg total) by mouth every 4 (four) hours as needed for moderate pain (pain score 4-6). 42 tablet 0   tamsulosin (FLOMAX) 0.4 MG CAPS capsule Take 0.4 mg by mouth every morning.     tiZANidine (ZANAFLEX) 4 MG tablet TAKE 1 TABLET (4 MG TOTAL) BY MOUTH EVERY 8 (EIGHT) HOURS AS NEEDED FOR MUSCLE SPASMS. 60 tablet 0   Turmeric 500 MG  TABS Take 500 mg by mouth daily.     vitamin B-12 (CYANOCOBALAMIN) 1000 MCG tablet Take 1,000 mcg by mouth daily.     vitamin C (ASCORBIC ACID) 500 MG tablet Take 500 mg by mouth daily.      No current facility-administered medications on file prior to visit.     Cardiovascular studies:  EKG 07/01/2018: Afib with controlled ventricular  rate  Echocardiogram 07/06/2018: Severely depressed LV systolic function with EF 20%. Left ventricle cavity is normal in size. Moderate concentric hypertrophy of the left ventricle. Severe global hypokinesis. Unable to evaluate diastolic function due to atrial fibrillation. Calculated EF 20%. Left atrial cavity is severely dilated at 5.4 cm. Mild aortic stenosis. Aortic valve mean gradient of 9 mmHg, Vmax of 2.0  m/s. Calculated aortic valve area by continuity equation is 1.7 cm.   Mild (Grade I) mitral regurgitation. Mild tricuspid regurgitation. Estimated pulmonary artery systolic pressure 20 mmHg.  Coronary angiogram 07/11/2016: Prox-mid RCA 30% stenosis. Nonobstructive CAD  Echocardiogram 07/10/2016: - Mildly dilated LV with mild LV hypertrophy. EF 50%, mild diffuse   hypokinesis. Mildly dilated RV with normal systolic function.   Mild aortic stenosis.  Recent labs: 07/01/2018:  Glucose 87. BUN/Cr 24/1.9. eGFR 36. CMP otherwise normal.  H/H 14/44. Platelets 163. BNP 164. Chol 161, TG 230, HDL 47, non-HDL cholesterol 114.   Review of Systems  Constitution: Negative for decreased appetite, malaise/fatigue, weight gain and weight loss.  HENT: Negative for congestion.   Eyes: Negative for visual disturbance.  Cardiovascular: Positive for dyspnea on exertion and leg swelling. Negative for chest pain, palpitations and syncope.  Respiratory: Positive for shortness of breath. Negative for cough.   Endocrine: Negative for cold intolerance.  Hematologic/Lymphatic: Does not bruise/bleed easily.  Skin: Negative for itching and rash.   Musculoskeletal: Negative for myalgias.  Gastrointestinal: Negative for abdominal pain, nausea and vomiting.  Genitourinary: Negative for dysuria.  Neurological: Negative for dizziness and weakness.  Psychiatric/Behavioral: The patient is not nervous/anxious.   All other systems reviewed and are negative.        Vitals:   07/06/18 1301  BP: (!) 142/87  Pulse: (!) 103  SpO2: 98%    Body mass index is 45.33 kg/m. Filed Weights   07/06/18 1301  Weight: (!) 325 lb (147.4 kg)     Objective:   Physical Exam  Constitutional: He is oriented to person, place, and time. He appears well-developed and well-nourished. No distress.  HENT:  Head: Normocephalic and atraumatic.  Eyes: Pupils are equal, round, and reactive to light. Conjunctivae are normal.  Neck: No JVD present.  Cardiovascular: Normal rate, regular rhythm and intact distal pulses.  Murmur heard.  Holosystolic murmur is present with a grade of 2/6. Pulmonary/Chest: Effort normal and breath sounds normal. He has no wheezes. He has no rales.  Abdominal: Soft. Bowel sounds are normal. There is no rebound.  Musculoskeletal:        General: Edema (1-2+ b/l) present.  Lymphadenopathy:    He has no cervical adenopathy.  Neurological: He is alert and oriented to person, place, and time. No cranial nerve deficit.  Skin: Skin is warm and dry.  Psychiatric: He has a normal mood and affect.  Nursing note and vitals reviewed.         Assessment & Recommendations:   63 y.o. Caucasian male with hypertension, hyperlipidemia, morbid obesity, referred for management of atrial fibrillation, heart failure.  1. Acute on chronic systolic heart failure ([place]) Some clinical improvement with continued diuresis. Continues to be in NYHA class III heart failure. Echocardiogram today shows EF 20-25% with severe global hypokinesis. Chart review suggests that reduced EF is new since echo in 2018. Coronary angiogram then showed  nonobstructive CAD. Given new drop in EF, he need repeat ischemic evaluation. Given his morbid obesity, I do not think nuclear stress test would have adequate accuracy to rule out obstructive CAD.  Coronary angiography with judicious use of contrast, along with left and right heart catheterization.  Schedule for cardiac catheterization, and possible angioplasty. We discussed regarding risks, benefits, alternatives to this including stress testing, CTA and continued medical therapy. Patient wants to proceed. Understands <1-2% risk of death, stroke, MI, urgent CABG, bleeding, infection, renal failure but not limited to these.  If obstructive CAD excluded, etiology differentials for HFrEF include hypertensive cardiomyopathy, Afib.  For medical management of HFrEF, ideally I would like him to be on ARNI, if not spironolactone, in light of his CKD. Will repeat BMP after coronary angiography and then start Entresto.   In the meantime, stop metoprolol tartarate 12.5 mg bid. Start carvedilol 3.125 mg bid. Reduce diltiazem to 240 mg every other day. In future, I would like to wean him off diltiazem and uptitrate carvedilol.  2. Other persistent atrial fibrillation Likely long standing, as suggested by severe LA enlargement. I would defer cardioversion until after heart failure is adequately treated.   3. Essential hypertension Management as above.   4. Morbid obesity ([place]) Diet and lifestyle modification, salt and calorie restriction diet.  5. Suspected OSA: Continue workup with sleep study.   I will see him after coronary angiography, left and right heart catheterization.  Nigel Mormon, MD [facility] Pager: [phone] Office: [phone] If no answer Cell [phone]

## 2018-07-10 ENCOUNTER — Other Ambulatory Visit (HOSPITAL_COMMUNITY)
Admission: RE | Admit: 2018-07-10 | Discharge: 2018-07-10 | Disposition: A | Discharge status: Home / Self Care | Payer: BC Managed Care – PPO | Source: Ambulatory Visit | Attending: Cardiology | Admitting: Cardiology

## 2018-07-10 ENCOUNTER — Other Ambulatory Visit: Payer: Self-pay

## 2018-07-10 DIAGNOSIS — Z01812 Encounter for preprocedural laboratory examination: Secondary | ICD-10-CM | POA: Insufficient documentation

## 2018-07-10 DIAGNOSIS — Z1159 Encounter for screening for other viral diseases: Secondary | ICD-10-CM | POA: Diagnosis not present

## 2018-07-11 LAB — NOVEL CORONAVIRUS, NAA (HOSP ORDER, SEND-OUT TO REF LAB; TAT 18-24 HRS): SARS-CoV-2, NAA: NOT DETECTED

## 2018-07-14 ENCOUNTER — Ambulatory Visit (HOSPITAL_COMMUNITY)
Admission: RE | Admit: 2018-07-14 | Discharge: 2018-07-14 | Disposition: A | Discharge status: Home / Self Care | Payer: BC Managed Care – PPO | Attending: Cardiology | Admitting: Cardiology

## 2018-07-14 ENCOUNTER — Ambulatory Visit (HOSPITAL_COMMUNITY)
Admission: RE | Disposition: A | Discharge status: Home / Self Care | Payer: Self-pay | Source: Home / Self Care | Attending: Cardiology

## 2018-07-14 ENCOUNTER — Other Ambulatory Visit: Payer: Self-pay

## 2018-07-14 DIAGNOSIS — I428 Other cardiomyopathies: Secondary | ICD-10-CM | POA: Diagnosis not present

## 2018-07-14 DIAGNOSIS — I425 Other restrictive cardiomyopathy: Secondary | ICD-10-CM

## 2018-07-14 DIAGNOSIS — I5023 Acute on chronic systolic (congestive) heart failure: Secondary | ICD-10-CM | POA: Insufficient documentation

## 2018-07-14 DIAGNOSIS — Z79899 Other long term (current) drug therapy: Secondary | ICD-10-CM | POA: Diagnosis not present

## 2018-07-14 DIAGNOSIS — I38 Endocarditis, valve unspecified: Secondary | ICD-10-CM

## 2018-07-14 DIAGNOSIS — M1990 Unspecified osteoarthritis, unspecified site: Secondary | ICD-10-CM | POA: Insufficient documentation

## 2018-07-14 DIAGNOSIS — I509 Heart failure, unspecified: Secondary | ICD-10-CM

## 2018-07-14 DIAGNOSIS — Z6841 Body Mass Index (BMI) 40.0 and over, adult: Secondary | ICD-10-CM | POA: Insufficient documentation

## 2018-07-14 DIAGNOSIS — N401 Enlarged prostate with lower urinary tract symptoms: Secondary | ICD-10-CM | POA: Insufficient documentation

## 2018-07-14 DIAGNOSIS — I4819 Other persistent atrial fibrillation: Secondary | ICD-10-CM | POA: Insufficient documentation

## 2018-07-14 DIAGNOSIS — I2510 Atherosclerotic heart disease of native coronary artery without angina pectoris: Secondary | ICD-10-CM | POA: Diagnosis not present

## 2018-07-14 DIAGNOSIS — Z96653 Presence of artificial knee joint, bilateral: Secondary | ICD-10-CM | POA: Diagnosis not present

## 2018-07-14 DIAGNOSIS — Z86718 Personal history of other venous thrombosis and embolism: Secondary | ICD-10-CM | POA: Diagnosis not present

## 2018-07-14 DIAGNOSIS — I110 Hypertensive heart disease with heart failure: Secondary | ICD-10-CM | POA: Insufficient documentation

## 2018-07-14 DIAGNOSIS — Z8249 Family history of ischemic heart disease and other diseases of the circulatory system: Secondary | ICD-10-CM | POA: Insufficient documentation

## 2018-07-14 DIAGNOSIS — K219 Gastro-esophageal reflux disease without esophagitis: Secondary | ICD-10-CM | POA: Insufficient documentation

## 2018-07-14 DIAGNOSIS — E785 Hyperlipidemia, unspecified: Secondary | ICD-10-CM | POA: Diagnosis not present

## 2018-07-14 DIAGNOSIS — R338 Other retention of urine: Secondary | ICD-10-CM | POA: Insufficient documentation

## 2018-07-14 HISTORY — PX: INTRAVASCULAR PRESSURE WIRE/FFR STUDY: CATH118243

## 2018-07-14 HISTORY — PX: LEFT HEART CATH AND CORONARY ANGIOGRAPHY: CATH118249

## 2018-07-14 LAB — POCT ACTIVATED CLOTTING TIME: Activated Clotting Time: 455 seconds

## 2018-07-14 SURGERY — LEFT HEART CATH AND CORONARY ANGIOGRAPHY
Anesthesia: LOCAL

## 2018-07-14 MED ORDER — ONDANSETRON HCL 4 MG/2ML IJ SOLN: 4.0000 mg | Freq: Four times a day (QID) | INTRAMUSCULAR | Status: DC | PRN

## 2018-07-14 MED ORDER — HEPARIN (PORCINE) IN NACL 1000-0.9 UT/500ML-% IV SOLN
INTRAVENOUS | Status: AC
  Filled 2018-07-14: qty 1000

## 2018-07-14 MED ORDER — HEPARIN SODIUM (PORCINE) 1000 UNIT/ML IJ SOLN
INTRAMUSCULAR | Status: AC
  Filled 2018-07-14: qty 1

## 2018-07-14 MED ORDER — MIDAZOLAM HCL 2 MG/2ML IJ SOLN
INTRAMUSCULAR | Status: DC | PRN
  Administered 2018-07-14: 1 mg via INTRAVENOUS

## 2018-07-14 MED ORDER — ASPIRIN 81 MG PO CHEW
CHEWABLE_TABLET | ORAL | Status: AC
  Administered 2018-07-14: 81 mg via ORAL
  Filled 2018-07-14: qty 1

## 2018-07-14 MED ORDER — HEPARIN SODIUM (PORCINE) 1000 UNIT/ML IJ SOLN
INTRAMUSCULAR | Status: DC | PRN
  Administered 2018-07-14 (×2): 6000 [IU] via INTRAVENOUS

## 2018-07-14 MED ORDER — SODIUM CHLORIDE 0.9% FLUSH: 3.0000 mL | Freq: Two times a day (BID) | INTRAVENOUS | Status: DC

## 2018-07-14 MED ORDER — SODIUM CHLORIDE 0.9 % IV SOLN: 250.0000 mL | INTRAVENOUS | Status: DC | PRN

## 2018-07-14 MED ORDER — MIDAZOLAM HCL 2 MG/2ML IJ SOLN
INTRAMUSCULAR | Status: AC
  Filled 2018-07-14: qty 2

## 2018-07-14 MED ORDER — SODIUM CHLORIDE 0.9 % IV SOLN
INTRAVENOUS | Status: DC
  Administered 2018-07-14: 12:00:00 via INTRAVENOUS

## 2018-07-14 MED ORDER — HYDRALAZINE HCL 20 MG/ML IJ SOLN: 10.0000 mg | INTRAMUSCULAR | Status: DC | PRN

## 2018-07-14 MED ORDER — HEPARIN (PORCINE) IN NACL 1000-0.9 UT/500ML-% IV SOLN
INTRAVENOUS | Status: DC | PRN
  Administered 2018-07-14 (×2): 500 mL

## 2018-07-14 MED ORDER — VERAPAMIL HCL 2.5 MG/ML IV SOLN
INTRAVENOUS | Status: AC
  Filled 2018-07-14: qty 2

## 2018-07-14 MED ORDER — LABETALOL HCL 5 MG/ML IV SOLN: 10.0000 mg | INTRAVENOUS | Status: DC | PRN

## 2018-07-14 MED ORDER — IOHEXOL 350 MG/ML SOLN
INTRAVENOUS | Status: DC | PRN
  Administered 2018-07-14: 65 mL via INTRA_ARTERIAL

## 2018-07-14 MED ORDER — ACETAMINOPHEN 325 MG PO TABS: 650.0000 mg | ORAL_TABLET | ORAL | Status: DC | PRN

## 2018-07-14 MED ORDER — SODIUM CHLORIDE 0.9 % IV SOLN: INTRAVENOUS | Status: AC

## 2018-07-14 MED ORDER — ASPIRIN 81 MG PO CHEW
81.0000 mg | CHEWABLE_TABLET | ORAL | Status: AC
  Administered 2018-07-14: 81 mg via ORAL

## 2018-07-14 MED ORDER — FENTANYL CITRATE (PF) 100 MCG/2ML IJ SOLN
INTRAMUSCULAR | Status: DC | PRN
  Administered 2018-07-14: 25 ug via INTRAVENOUS

## 2018-07-14 MED ORDER — NITROGLYCERIN 1 MG/10 ML FOR IR/CATH LAB
INTRA_ARTERIAL | Status: AC
  Filled 2018-07-14: qty 10

## 2018-07-14 MED ORDER — FENTANYL CITRATE (PF) 100 MCG/2ML IJ SOLN
INTRAMUSCULAR | Status: AC
  Filled 2018-07-14: qty 2

## 2018-07-14 MED ORDER — VERAPAMIL HCL 2.5 MG/ML IV SOLN
INTRAVENOUS | Status: DC | PRN
  Administered 2018-07-14: 13:00:00 via INTRA_ARTERIAL

## 2018-07-14 MED ORDER — SODIUM CHLORIDE 0.9% FLUSH: 3.0000 mL | INTRAVENOUS | Status: DC | PRN

## 2018-07-14 MED ORDER — LIDOCAINE HCL (PF) 1 % IJ SOLN
INTRAMUSCULAR | Status: DC | PRN
  Administered 2018-07-14 (×2): 2 mL

## 2018-07-14 MED ORDER — LIDOCAINE HCL (PF) 1 % IJ SOLN
INTRAMUSCULAR | Status: AC
  Filled 2018-07-14: qty 30

## 2018-07-14 SURGICAL SUPPLY — 17 items
CATH 5FR JL3.5 JR4 ANG PIG MP (CATHETERS) ×2 IMPLANT
CATH BALLN WEDGE 5F 110CM (CATHETERS) ×2 IMPLANT
CATH LAUNCHER 6FR EBU3.5 (CATHETERS) ×2 IMPLANT
DEVICE RAD COMP TR BAND LRG (VASCULAR PRODUCTS) ×2 IMPLANT
GLIDESHEATH SLEND A-KIT 6F 22G (SHEATH) ×2 IMPLANT
GUIDEWIRE .025 260CM (WIRE) ×2 IMPLANT
GUIDEWIRE INQWIRE 1.5J.035X260 (WIRE) ×1 IMPLANT
GUIDEWIRE PRESSURE COMET II (WIRE) ×2 IMPLANT
INQWIRE 1.5J .035X260CM (WIRE) ×2
KIT ENCORE 26 ADVANTAGE (KITS) ×2 IMPLANT
KIT HEART LEFT (KITS) ×2 IMPLANT
KIT HEMO VALVE WATCHDOG (MISCELLANEOUS) ×2 IMPLANT
PACK CARDIAC CATHETERIZATION (CUSTOM PROCEDURE TRAY) ×2 IMPLANT
SHEATH GLIDE SLENDER 4/5FR (SHEATH) ×2 IMPLANT
TRANSDUCER W/STOPCOCK (MISCELLANEOUS) ×2 IMPLANT
TUBING CIL FLEX 10 FLL-RA (TUBING) ×2 IMPLANT
WIRE ASAHI PROWATER 180CM (WIRE) ×2 IMPLANT

## 2018-07-14 NOTE — Discharge Instructions (Signed)
Radial Site Care ° °This sheet gives you information about how to care for yourself after your procedure. Your health care provider may also give you more specific instructions. If you have problems or questions, contact your health care provider. °What can I expect after the procedure? °After the procedure, it is common to have: °· Bruising and tenderness at the catheter insertion area. °Follow these instructions at home: °Medicines °· Take over-the-counter and prescription medicines only as told by your health care provider. °Insertion site care °· Follow instructions from your health care provider about how to take care of your insertion site. Make sure you: °? Wash your hands with soap and water before you change your bandage (dressing). If soap and water are not available, use hand sanitizer. °? Change your dressing as told by your health care provider. °? Leave stitches (sutures), skin glue, or adhesive strips in place. These skin closures may need to stay in place for 2 weeks or longer. If adhesive strip edges start to loosen and curl up, you may trim the loose edges. Do not remove adhesive strips completely unless your health care provider tells you to do that. °· Check your insertion site every day for signs of infection. Check for: °? Redness, swelling, or pain. °? Fluid or blood. °? Pus or a bad smell. °? Warmth. °· Do not take baths, swim, or use a hot tub until your health care provider approves. °· You may shower 24-48 hours after the procedure, or as directed by your health care provider. °? Remove the dressing and gently wash the site with plain soap and water. °? Pat the area dry with a clean towel. °? Do not rub the site. That could cause bleeding. °· Do not apply powder or lotion to the site. °Activity ° °· For 24 hours after the procedure, or as directed by your health care provider: °? Do not flex or bend the affected arm. °? Do not push or pull heavy objects with the affected arm. °? Do not  drive yourself home from the hospital or clinic. You may drive 24 hours after the procedure unless your health care provider tells you not to. °? Do not operate machinery or power tools. °· Do not lift anything that is heavier than 10 lb (4.5 kg), or the limit that you are told, until your health care provider says that it is safe. °· Ask your health care provider when it is okay to: °? Return to work or school. °? Resume usual physical activities or sports. °? Resume sexual activity. °General instructions °· If the catheter site starts to bleed, raise your arm and put firm pressure on the site. If the bleeding does not stop, get help right away. This is a medical emergency. °· If you went home on the same day as your procedure, a responsible adult should be with you for the first 24 hours after you arrive home. °· Keep all follow-up visits as told by your health care provider. This is important. °Contact a health care provider if: °· You have a fever. °· You have redness, swelling, or yellow drainage around your insertion site. °Get help right away if: °· You have unusual pain at the radial site. °· The catheter insertion area swells very fast. °· The insertion area is bleeding, and the bleeding does not stop when you hold steady pressure on the area. °· Your arm or hand becomes pale, cool, tingly, or numb. °These symptoms may represent a serious problem   that is an emergency. Do not wait to see if the symptoms will go away. Get medical help right away. Call your local emergency services (911 in the [place] Do not drive yourself to the hospital. °Summary °· After the procedure, it is common to have bruising and tenderness at the site. °· Follow instructions from your health care provider about how to take care of your radial site wound. Check the wound every day for signs of infection. °· Do not lift anything that is heavier than 10 lb (4.5 kg), or the limit that you are told, until your health care provider says  that it is safe. °This information is not intended to replace advice given to you by your health care provider. Make sure you discuss any questions you have with your health care provider. °Document Released: 02/23/2010 Document Revised: 02/26/2017 Document Reviewed: 02/26/2017 °[employer] Interactive Patient Education © 2019 [employer] ° °

## 2018-07-14 NOTE — H&P (Signed)
Office note from 06/01 copied for documentation purposes.   Subjective:   Stephen Maldonado, male    DOB: May 27, 1955, 63 y.o.   MRN: [id number]      Chief Complaint  Patient presents with  . Atrial Fibrillation  . Follow-up    HPI  63 y.o.Caucasianmalewith hypertension, hyperlipidemia, morbid obesity, referred for management of atrial fibrillation, heart failure.  At last visit, I continued lasix 80 mg daily, along with diltiazem 240 mg daily, metoprolol 12.5 mg bid. Patient has lost nearly 6 Kg wt since last visit with some improvement in shortness of breath and leg edema.  Further chart review today shows that patient underwent cardiac workup by Dr. Dixie Dials for chest pain that showed nonobstructive CAD, and normal EF on echocardiogram. His Cr has stayed relatively stable around 1.9-2.0.         Past Medical History:  Diagnosis Date  . Arthritis    knees  . Bladder spasms   . BPH (benign prostatic hyperplasia)   . DVT (deep venous thrombosis) ([place]) ocyt 2016   left leg tx medically  . GERD (gastroesophageal reflux disease)   . History of colon polyps    hyperplastic 06-14-2010  . Hydronephrosis   . Hypertension   . Obesity, morbid ([place])   . Urinary retention           Past Surgical History:  Procedure Laterality Date  . COLONOSCOPY  06/14/2010  . CORONARY ANGIOGRAPHY N/A 07/11/2016   Procedure: Coronary Angiography;  Surgeon: Dixie Dials, MD;  Location: [facility];  Service: Cardiovascular;  Laterality: N/A;  . CYSTOSCOPY W/ RETROGRADES Bilateral 10/03/2015   Procedure: CYSTOSCOPY WITH RETROGRADE PYELOGRAM;  Surgeon: [name], MD;  Location: [facility];  Service: Urology;  Laterality: Bilateral;  . GREEN LIGHT LASER TURP (TRANSURETHRAL RESECTION OF PROSTATE N/A 10/03/2015   Procedure: GREEN LIGHT LASER,  TURP - STAGED(TRANSURETHRAL RESECTION OF PROSTATE;  Surgeon: [name], MD;  Location:  [facility];  Service: Urology;  Laterality: N/A;  . KNEE ARTHROSCOPY     not sure which knee  . QUADRICEPS TENDON REPAIR Bilateral left 02/24/2003/   right 2006  . TOTAL KNEE ARTHROPLASTY Right 12/01/2015   Procedure: RIGHT TOTAL KNEE ARTHROPLASTY;  Surgeon: [name], MD;  Location: [facility];  Service: Orthopedics;  Laterality: Right;  . TOTAL KNEE ARTHROPLASTY Left 01/10/2017   Procedure: LEFT TOTAL KNEE ARTHROPLASTY;  Surgeon: [name], MD;  Location: [facility];  Service: Orthopedics;  Laterality: Left;  . TOTAL SHOULDER ARTHROPLASTY Right 01/23/2018   Procedure: RIGHT REVERSE TOTAL SHOULDER ARTHROPLASTY;  Surgeon: Meredith Pel, MD;  Location: [facility];  Service: Orthopedics;  Laterality: Right;     Social History        Socioeconomic History  . Marital status: Married    Spouse name: Not on file  . Number of children: 1  . Years of education: Not on file  . Highest education level: Not on file  Occupational History  . Not on file  Social Needs  . Financial resource strain: Not on file  . Food insecurity:    Worry: Not on file    Inability: Not on file  . Transportation needs:    Medical: Not on file    Non-medical: Not on file  Tobacco Use  . Smoking status: Never Smoker  . Smokeless tobacco: Former Systems developer    Types: Chew  Substance and Sexual Activity  . Alcohol use: Yes  Comment: 1-2 beers days  . Drug use: No  . Sexual activity: Not Currently  Lifestyle  . Physical activity:    Days per week: Not on file    Minutes per session: Not on file  . Stress: Not on file  Relationships  . Social connections:    Talks on phone: Not on file    Gets together: Not on file    Attends religious service: Not on file    Active member of club or organization: Not on file    Attends meetings of clubs or organizations: Not on file    Relationship status: Not on file  . Intimate partner violence:     Fear of current or ex partner: Not on file    Emotionally abused: Not on file    Physically abused: Not on file    Forced sexual activity: Not on file  Other Topics Concern  . Not on file  Social History Narrative  . Not on file          Family History  Problem Relation Age of Onset  . CAD Mother   . Lung cancer Mother   . Breast cancer Mother   . Diabetes Mother   . Heart failure Father   . CAD Father   . Diabetes Father            Current Outpatient Medications on File Prior to Visit  Medication Sig Dispense Refill  . acetaminophen (TYLENOL) 325 MG tablet Take 1-2 tablets (325-650 mg total) by mouth every 6 (six) hours as needed for mild pain (pain score 1-3 or temp > 100.5). 30 tablet 0  . Cholecalciferol (VITAMIN D-3 PO) Take 2,000 Units by mouth daily.     . Coenzyme Q10 (COQ10) 100 MG CAPS Take 100 mg by mouth daily.     . cyclobenzaprine (FLEXERIL) 10 MG tablet Take 10 mg by mouth as needed for muscle spasms.    Marland Kitchen diltiazem (CARDIZEM CD) 240 MG 24 hr capsule Take 240 mg by mouth daily.  5  . diphenhydrAMINE (BENADRYL) 25 MG tablet Take 25 mg by mouth at bedtime as needed.     . furosemide (LASIX) 20 MG tablet Take 80 mg by mouth daily.   0  . Garlic 3299 MG CAPS Take 1,000 mg by mouth daily.    . hydrALAZINE (APRESOLINE) 25 MG tablet Take 25 mg by mouth 2 (two) times daily with a meal.  3  . Lidocaine-Menthol (ICY HOT LIDOCAINE PLUS MENTHOL EX) Apply 1 application topically as needed (for back pain).    . metoprolol tartrate (LOPRESSOR) 25 MG tablet Take 0.5 tablets (12.5 mg total) by mouth 2 (two) times daily. 30 tablet 3  . Omega-3 Fatty Acids (FISH OIL) 1000 MG CAPS Take by mouth daily.    Marland Kitchen oxyCODONE (OXY IR/ROXICODONE) 5 MG immediate release tablet Take 2 tablets (10 mg total) by mouth every 4 (four) hours as needed for moderate pain (pain score 4-6). 42 tablet 0  . tamsulosin (FLOMAX) 0.4 MG CAPS capsule Take 0.4 mg by  mouth every morning.    Marland Kitchen tiZANidine (ZANAFLEX) 4 MG tablet TAKE 1 TABLET (4 MG TOTAL) BY MOUTH EVERY 8 (EIGHT) HOURS AS NEEDED FOR MUSCLE SPASMS. 60 tablet 0  . Turmeric 500 MG TABS Take 500 mg by mouth daily.    . vitamin B-12 (CYANOCOBALAMIN) 1000 MCG tablet Take 1,000 mcg by mouth daily.    . vitamin C (ASCORBIC ACID) 500 MG tablet Take 500 mg  by mouth daily.      No current facility-administered medications on file prior to visit.     Cardiovascular studies:  EKG 07/01/2018: Afib with controlled ventricular rate  Echocardiogram 07/06/2018: Severely depressed LV systolic function with EF 20%. Left ventricle cavity is normal in size. Moderate concentric hypertrophy of the left ventricle. Severe global hypokinesis. Unable to evaluate diastolic function due to atrial fibrillation. Calculated EF 20%. Left atrial cavity is severely dilated at 5.4 cm. Mild aortic stenosis. Aortic valve mean gradient of 9 mmHg, Vmax of 2.0  m/s. Calculated aortic valve area by continuity equation is 1.7 cm.   Mild (Grade I) mitral regurgitation. Mild tricuspid regurgitation. Estimated pulmonary artery systolic pressure 20 mmHg.  Coronary angiogram 07/11/2016: Prox-mid RCA 30% stenosis. Nonobstructive CAD  Echocardiogram 07/10/2016: - Mildly dilated LV with mild LV hypertrophy. EF 50%, mild diffuse hypokinesis. Mildly dilated RV with normal systolic function. Mild aortic stenosis.  Recent labs: 07/01/2018:  Glucose 87. BUN/Cr 24/1.9. eGFR 36. CMP otherwise normal.  H/H 14/44. Platelets 163. BNP 164. Chol 161, TG 230, HDL 47, non-HDL cholesterol 114.   Review of Systems  Constitution: Negative for decreased appetite, malaise/fatigue, weight gain and weight loss.  HENT: Negative for congestion.   Eyes: Negative for visual disturbance.  Cardiovascular: Positive for dyspnea on exertion and leg swelling. Negative for chest pain, palpitations and syncope.  Respiratory: Positive  for shortness of breath. Negative for cough.   Endocrine: Negative for cold intolerance.  Hematologic/Lymphatic: Does not bruise/bleed easily.  Skin: Negative for itching and rash.  Musculoskeletal: Negative for myalgias.  Gastrointestinal: Negative for abdominal pain, nausea and vomiting.  Genitourinary: Negative for dysuria.  Neurological: Negative for dizziness and weakness.  Psychiatric/Behavioral: The patient is not nervous/anxious.   All other systems reviewed and are negative.           Vitals:   07/06/18 1301  BP: (!) 142/87  Pulse: (!) 103  SpO2: 98%    Body mass index is 45.33 kg/m.    Filed Weights   07/06/18 1301  Weight: (!) 325 lb (147.4 kg)     Objective:   Objective   Physical Exam  Constitutional: He is oriented to person, place, and time. He appears well-developed and well-nourished. No distress.  HENT:  Head: Normocephalic and atraumatic.  Eyes: Pupils are equal, round, and reactive to light. Conjunctivae are normal.  Neck: No JVD present.  Cardiovascular: Normal rate, regular rhythm and intact distal pulses.  Murmur heard.  Holosystolic murmur is present with a grade of 2/6. Pulmonary/Chest: Effort normal and breath sounds normal. He has no wheezes. He has no rales.  Abdominal: Soft. Bowel sounds are normal. There is no rebound.  Musculoskeletal:        General: Edema (1-2+ b/l) present.  Lymphadenopathy:    He has no cervical adenopathy.  Neurological: He is alert and oriented to person, place, and time. No cranial nerve deficit.  Skin: Skin is warm and dry.  Psychiatric: He has a normal mood and affect.  Nursing note and vitals reviewed.         Assessment & Recommendations:   63 y.o.Caucasianmalewith hypertension, hyperlipidemia, morbid obesity, referred for management of atrial fibrillation, heart failure.  1. Acute on chronic systolic heart failure ([place]) Some clinical improvement with continued diuresis.  Continues to be in NYHA class III heart failure. Echocardiogram today shows EF 20-25% with severe global hypokinesis. Chart review suggests that reduced EF is new since echo in 2018. Coronary angiogram then showed  nonobstructive CAD. Given new drop in EF, he need repeat ischemic evaluation. Given his morbid obesity, I do not think nuclear stress test would have adequate accuracy to rule out obstructive CAD. Coronary angiography with judicious use of contrast, along with left and right heart catheterization.  Schedule for cardiac catheterization, and possible angioplasty. We discussed regarding risks, benefits, alternatives to this including stress testing, CTA and continued medical therapy. Patient wants to proceed. Understands <1-2% risk of death, stroke, MI, urgent CABG, bleeding, infection, renal failure but not limited to these.  If obstructive CAD excluded, etiology differentials for HFrEF include hypertensive cardiomyopathy, Afib.  For medical management of HFrEF, ideally I would like him to be on ARNI, if not spironolactone, in light of his CKD. Will repeat BMP after coronary angiography and then start Entresto.   In the meantime, stop metoprolol tartarate 12.5 mg bid. Start carvedilol 3.125 mg bid. Reduce diltiazem to 240 mg every other day. In future, I would like to wean him off diltiazem and uptitrate carvedilol.  2. Other persistent atrial fibrillation Likely long standing, as suggested by severe LA enlargement. I would defer cardioversion until after heart failure is adequately treated.   3. Essential hypertension Management as above.   4. Morbid obesity ([place]) Diet and lifestyle modification, salt and calorie restriction diet.  5. Suspected OSA: Continue workup with sleep study.   I will see him after coronary angiography, left and right heart catheterization.  Nigel Mormon, MD [facility] [place] Pager: [phone] Office: [phone] If no answer  Cell [phone]

## 2018-07-14 NOTE — Interval H&P Note (Signed)
History and Physical Interval Note:  07/14/2018 12:48 PM  Stephen Maldonado  has presented today for surgery, with the diagnosis of heart failure.  The various methods of treatment have been discussed with the patient and family. After consideration of risks, benefits and other options for treatment, the patient has consented to  Procedure(s): RIGHT/LEFT HEART CATH AND CORONARY ANGIOGRAPHY (N/A) as a surgical intervention.  The patient's history has been reviewed, patient examined, no change in status, stable for surgery.  I have reviewed the patient's chart and labs.  Questions were answered to the patient's satisfaction.     2012 Appropriate Use Criteria for Diagnostic Catheterization Home / Select Test of Interest Indication for RHC Cardiomyopathies Cardiomyopathies (Right and Left Heart Catheterization OR Right Heart Catheterization Alone With/Wit Cardiomyopathies  (Right and Left Heart Catheterization OR  Right Heart Catheterization Alone With/Without Left Ventriculography and Coronary Angiography)  Link Here: [email or web address] Indication:  1. Known or suspected cardiomyopathy with or without heart failure A (7) Indication: 93; Score 7    Revia Nghiem J Jaycub Noorani

## 2018-07-15 ENCOUNTER — Encounter (HOSPITAL_COMMUNITY): Payer: Self-pay | Admitting: Cardiology

## 2018-07-15 MED FILL — Nitroglycerin IV Soln 100 MCG/ML in D5W: INTRA_ARTERIAL | Qty: 10 | Status: AC

## 2018-07-16 ENCOUNTER — Other Ambulatory Visit: Payer: Self-pay | Admitting: Cardiology

## 2018-07-16 DIAGNOSIS — I5022 Chronic systolic (congestive) heart failure: Secondary | ICD-10-CM

## 2018-07-16 DIAGNOSIS — I480 Paroxysmal atrial fibrillation: Secondary | ICD-10-CM | POA: Diagnosis not present

## 2018-07-17 ENCOUNTER — Encounter: Payer: Self-pay | Admitting: Cardiology

## 2018-07-17 ENCOUNTER — Other Ambulatory Visit: Payer: Self-pay

## 2018-07-17 ENCOUNTER — Ambulatory Visit (INDEPENDENT_AMBULATORY_CARE_PROVIDER_SITE_OTHER): Payer: BC Managed Care – PPO | Admitting: Cardiology

## 2018-07-17 VITALS — BP 155/98 | HR 100 | Temp 95.7°F | Ht 71.0 in | Wt 324.0 lb

## 2018-07-17 DIAGNOSIS — I5022 Chronic systolic (congestive) heart failure: Secondary | ICD-10-CM | POA: Diagnosis not present

## 2018-07-17 DIAGNOSIS — I4811 Longstanding persistent atrial fibrillation: Secondary | ICD-10-CM | POA: Diagnosis not present

## 2018-07-17 LAB — BASIC METABOLIC PANEL
BUN/Creatinine Ratio: 14 (ref 10–24)
BUN: 25 mg/dL (ref 8–27)
CO2: 29 mmol/L (ref 20–29)
Calcium: 9.5 mg/dL (ref 8.6–10.2)
Chloride: 96 mmol/L (ref 96–106)
Creatinine, Ser: 1.76 mg/dL — ABNORMAL HIGH (ref 0.76–1.27)
GFR calc Af Amer: 47 mL/min/{1.73_m2} — ABNORMAL LOW (ref >59)
GFR calc non Af Amer: 40 mL/min/{1.73_m2} — ABNORMAL LOW (ref >59)
Glucose: 105 mg/dL — ABNORMAL HIGH (ref 65–99)
Potassium: 4.2 mmol/L (ref 3.5–5.2)
Sodium: 143 mmol/L (ref 134–144)

## 2018-07-17 MED ORDER — CARVEDILOL 6.25 MG PO TABS: 3.1250 mg | ORAL_TABLET | Freq: Two times a day (BID) | ORAL | 1 refills | Status: DC

## 2018-07-17 MED ORDER — SACUBITRIL-VALSARTAN 24-26 MG PO TABS: 1.0000 | ORAL_TABLET | Freq: Two times a day (BID) | ORAL | Status: DC

## 2018-07-17 NOTE — Progress Notes (Signed)
Follow up visit  Subjective:   Stephen Maldonado, male    DOB: 05/06/1955, 63 y.o.   MRN: [id number]   Chief Complaint  Patient presents with  . Cardiomyopathy    HPI  63 y.o. Caucasian male with hypertension, hyperlipidemia, morbid obesity, referred for management of atrial fibrillation, heart failure.  Patient underwent RHC/LHC, that showed nonobstructive (FFR 0.97 in LCx) CAD, suggesting nonischemic cardiomyopathy. Follow up labs show Cr improved from 1.95 to 1.76. He has had improvement in his breathing, although leg swelling persists.     Past Medical History:  Diagnosis Date  . Arthritis    knees  . Bladder spasms   . BPH (benign prostatic hyperplasia)   . DVT (deep venous thrombosis) ([place]) ocyt 2016   left leg tx medically  . GERD (gastroesophageal reflux disease)   . History of colon polyps    hyperplastic 06-14-2010  . Hydronephrosis   . Hypertension   . Obesity, morbid ([place])   . Urinary retention      Past Surgical History:  Procedure Laterality Date  . COLONOSCOPY  06/14/2010  . CORONARY ANGIOGRAPHY N/A 07/11/2016   Procedure: Coronary Angiography;  Surgeon: Dixie Dials, MD;  Location: [place] [facility];  Service: Cardiovascular;  Laterality: N/A;  . CYSTOSCOPY W/ RETROGRADES Bilateral 10/03/2015   Procedure: CYSTOSCOPY WITH RETROGRADE PYELOGRAM;  Surgeon: [name], MD;  Location: [facility];  Service: Urology;  Laterality: Bilateral;  . GREEN LIGHT LASER TURP (TRANSURETHRAL RESECTION OF PROSTATE N/A 10/03/2015   Procedure: GREEN LIGHT LASER,  TURP - STAGED(TRANSURETHRAL RESECTION OF PROSTATE;  Surgeon: [name], MD;  Location: [facility];  Service: Urology;  Laterality: N/A;  . INTRAVASCULAR PRESSURE WIRE/FFR STUDY N/A 07/14/2018   Procedure: INTRAVASCULAR PRESSURE WIRE/FFR STUDY;  Surgeon: Nigel Mormon, MD;  Location: [place] [facility];  Service: Cardiovascular;  Laterality: N/A;  . KNEE ARTHROSCOPY      not sure which knee  . LEFT HEART CATH AND CORONARY ANGIOGRAPHY N/A 07/14/2018   Procedure: LEFT HEART CATH AND CORONARY ANGIOGRAPHY;  Surgeon: Nigel Mormon, MD;  Location: [place] [facility];  Service: Cardiovascular;  Laterality: N/A;  . QUADRICEPS TENDON REPAIR Bilateral left 02/24/2003/   right 2006  . TOTAL KNEE ARTHROPLASTY Right 12/01/2015   Procedure: RIGHT TOTAL KNEE ARTHROPLASTY;  Surgeon: [name], MD;  Location: [facility];  Service: Orthopedics;  Laterality: Right;  . TOTAL KNEE ARTHROPLASTY Left 01/10/2017   Procedure: LEFT TOTAL KNEE ARTHROPLASTY;  Surgeon: [name], MD;  Location: [facility];  Service: Orthopedics;  Laterality: Left;  . TOTAL SHOULDER ARTHROPLASTY Right 01/23/2018   Procedure: RIGHT REVERSE TOTAL SHOULDER ARTHROPLASTY;  Surgeon: Meredith Pel, MD;  Location: [place];  Service: Orthopedics;  Laterality: Right;     Social History   Socioeconomic History  . Marital status: Married    Spouse name: Not on file  . Number of children: 1  . Years of education: Not on file  . Highest education level: Not on file  Occupational History  . Not on file  Social Needs  . Financial resource strain: Not on file  . Food insecurity    Worry: Not on file    Inability: Not on file  . Transportation needs    Medical: Not on file    Non-medical: Not on file  Tobacco Use  . Smoking status: Never Smoker  . Smokeless tobacco: Former Systems developer    Types: Chew  Substance and Sexual  Activity  . Alcohol use: Yes    Comment: 1-2 beers days  . Drug use: No  . Sexual activity: Not Currently  Lifestyle  . Physical activity    Days per week: Not on file    Minutes per session: Not on file  . Stress: Not on file  Relationships  . Social Herbalist on phone: Not on file    Gets together: Not on file    Attends religious service: Not on file    Active member of club or organization: Not on file    Attends meetings of clubs or  organizations: Not on file    Relationship status: Not on file  . Intimate partner violence    Fear of current or ex partner: Not on file    Emotionally abused: Not on file    Physically abused: Not on file    Forced sexual activity: Not on file  Other Topics Concern  . Not on file  Social History Narrative  . Not on file     Family History  Problem Relation Age of Onset  . CAD Mother   . Lung cancer Mother   . Breast cancer Mother   . Diabetes Mother   . Heart failure Father   . CAD Father   . Diabetes Father      Current Outpatient Medications on File Prior to Visit  Medication Sig Dispense Refill  . Cholecalciferol (VITAMIN D) 50 MCG (2000 UT) tablet Take 2,000 Units by mouth daily.    . Coenzyme Q10 (COQ-10) 200 MG CAPS Take 200 mg by mouth daily.    . cyclobenzaprine (FLEXERIL) 10 MG tablet Take 10 mg by mouth as needed for muscle spasms.    . diphenhydrAMINE (BENADRYL) 25 MG tablet Take 25 mg by mouth at bedtime as needed for allergies or sleep.     . furosemide (LASIX) 80 MG tablet Take 80 mg by mouth daily.    . Garlic 5993 MG CAPS Take 1,000 mg by mouth daily.    . hydrALAZINE (APRESOLINE) 25 MG tablet Take 25 mg by mouth 2 (two) times daily with a meal.  3  . Ketotifen Fumarate (ALLERGY EYE DROPS OP) Place 1 drop into both eyes daily as needed (allergies).    . naproxen sodium (ALEVE) 220 MG tablet Take 220 mg by mouth at bedtime as needed (allergies).    . Omega-3 Fatty Acids (FISH OIL) 1000 MG CAPS Take 1,000 mg by mouth daily.     Marland Kitchen oxyCODONE (OXY IR/ROXICODONE) 5 MG immediate release tablet Take 2 tablets (10 mg total) by mouth every 4 (four) hours as needed for moderate pain (pain score 4-6). (Patient taking differently: Take 5 mg by mouth every 4 (four) hours as needed for moderate pain (pain score 4-6). ) 42 tablet 0  . Potassium Chloride ER 20 MEQ TBCR Take 1 tablet by mouth daily. with food    . rivaroxaban (XARELTO) 20 MG TABS tablet Take 20 mg by mouth  daily with supper.    . tamsulosin (FLOMAX) 0.4 MG CAPS capsule Take 0.4 mg by mouth every morning.    Marland Kitchen tiZANidine (ZANAFLEX) 4 MG tablet TAKE 1 TABLET (4 MG TOTAL) BY MOUTH EVERY 8 (EIGHT) HOURS AS NEEDED FOR MUSCLE SPASMS. 60 tablet 0  . Turmeric 500 MG TABS Take 500 mg by mouth daily.    . vitamin B-12 (CYANOCOBALAMIN) 1000 MCG tablet Take 1,000 mcg by mouth daily.    . vitamin C (  ASCORBIC ACID) 500 MG tablet Take 500 mg by mouth daily.      No current facility-administered medications on file prior to visit.     Cardiovascular studies:  Left/right heart cath 07/14/2018: LM: Normal LAD: No significant disease LCx: Mid LCx focal 60% stenosis. dFR 0.97 (Non-significant) RCA: Prox 30%, mid 50% focal stenoses. LVEDP mildly elevated.  Impression: Nonobstructive CAD Nonischemic cardiomyopathy  EKG 07/01/2018: Afib with controlled ventricular rate  Echocardiogram 07/06/2018: Severely depressed LV systolic function with EF 20%. Left ventricle cavity is normal in size. Moderate concentric hypertrophy of the left ventricle. Severe global hypokinesis. Unable to evaluate diastolic function due to atrial fibrillation. Calculated EF 20%. Left atrial cavity is severely dilated at 5.4 cm. Mild aortic stenosis. Aortic valve mean gradient of 9 mmHg, Vmax of 2.0  m/s. Calculated aortic valve area by continuity equation is 1.7 cm.   Mild (Grade I) mitral regurgitation. Mild tricuspid regurgitation. Estimated pulmonary artery systolic pressure 20 mmHg.  Coronary angiogram 07/11/2016: Prox-mid RCA 30% stenosis. Nonobstructive CAD  Echocardiogram 07/10/2016: - Mildly dilated LV with mild LV hypertrophy. EF 50%, mild diffuse   hypokinesis. Mildly dilated RV with normal systolic function.   Mild aortic stenosis.  Recent labs: 07/16/2018: Glucose 10. BUN/Cr 25/1.76, eGFR 40. Na/K 143/4.2  07/01/2018:  Glucose 87. BUN/Cr 24/1.9. eGFR 36. CMP otherwise normal.  H/H 14/44. Platelets 163.  BNP 164. Chol 161, TG 230, HDL 47, non-HDL cholesterol 114.   Review of Systems  Constitution: Negative for decreased appetite, malaise/fatigue, weight gain and weight loss.  HENT: Negative for congestion.   Eyes: Negative for visual disturbance.  Cardiovascular: Positive for dyspnea on exertion and leg swelling. Negative for chest pain, palpitations and syncope.  Respiratory: Positive for shortness of breath. Negative for cough.   Endocrine: Negative for cold intolerance.  Hematologic/Lymphatic: Does not bruise/bleed easily.  Skin: Negative for itching and rash.  Musculoskeletal: Negative for myalgias.  Gastrointestinal: Negative for abdominal pain, nausea and vomiting.  Genitourinary: Negative for dysuria.  Neurological: Negative for dizziness and weakness.  Psychiatric/Behavioral: The patient is not nervous/anxious.   All other systems reviewed and are negative.        Vitals:   07/17/18 1146  BP: (!) 155/98  Pulse: 100  Temp: (!) 95.7 F (35.4 C)  SpO2: 94%    Body mass index is 45.19 kg/m. Filed Weights   07/17/18 1146  Weight: (!) 147 kg     Objective:   Physical Exam  Constitutional: He is oriented to person, place, and time. He appears well-developed and well-nourished. No distress.  HENT:  Head: Normocephalic and atraumatic.  Eyes: Pupils are equal, round, and reactive to light. Conjunctivae are normal.  Neck: No JVD present.  Cardiovascular: Normal rate, regular rhythm and intact distal pulses.  Murmur heard.  Holosystolic murmur is present with a grade of 2/6. Pulmonary/Chest: Effort normal and breath sounds normal. He has no wheezes. He has no rales.  Abdominal: Soft. Bowel sounds are normal. There is no rebound.  Musculoskeletal:        General: Edema (1-2+ b/l) present.  Lymphadenopathy:    He has no cervical adenopathy.  Neurological: He is alert and oriented to person, place, and time. No cranial nerve deficit.  Skin: Skin is warm and dry.   Psychiatric: He has a normal mood and affect.  Nursing note and vitals reviewed.         Assessment & Recommendations:   63 y.o. Caucasian male with hypertension, hyperlipidemia, morbid obesity, referred  for management of atrial fibrillation, heart failure.  1. Chronic systolic heart failure ([place]): NYHA class II-III symptoms. Nonischemic cardiomyopathy Etiology multifactorial, including Afib, obesity, OSA. Stop diltiazem. Increase carvedilol to 6.25 mg bid Start Entresto 24-26 mg bid. Continue lasix 80 mg dailly.  2. Persistent Afib: Rate controlled.  Likely long standing, as suggested by severe LA enlargement. I would defer cardioversion until after heart failure is adequately treated.  CHA2DS2VASc score 3, annual stroke risk 3 Currently on Xarelto 20 mg daily. Depending on renal function, may need to reduce dose to 15 mg daily, or consider Eliquis 5 mg bid. Will discuss further at next visit.   3. Essential hypertension Management as above.   4. Morbid obesity ([place]) Diet and lifestyle modification, salt and calorie restriction diet.  5. Suspected OSA: Continue workup with sleep study.   BMP 06/22. F/u 06/26.  Nigel Mormon, MD [facility] Pager: [phone] Office: [phone] If no answer Cell [phone]

## 2018-07-21 ENCOUNTER — Telehealth: Payer: Self-pay

## 2018-07-21 NOTE — Telephone Encounter (Signed)
Likely side effect of Entresto. Stop Entresto. Start losartan 50 mg once daily. 30 pills 1 refills.  Thanks MJP

## 2018-07-21 NOTE — Telephone Encounter (Signed)
Patient calling and saying Stephen Maldonado is making him have a bad cough

## 2018-07-22 ENCOUNTER — Other Ambulatory Visit: Payer: Self-pay

## 2018-07-22 MED ORDER — LOSARTAN POTASSIUM 50 MG PO TABS: 50.0000 mg | ORAL_TABLET | Freq: Every day | ORAL | 1 refills | Status: DC

## 2018-07-22 NOTE — Telephone Encounter (Signed)
Tried calling patient

## 2018-07-22 NOTE — Telephone Encounter (Signed)
S/w patient sent in mew meds losartan 50 mg daily

## 2018-07-27 DIAGNOSIS — I5022 Chronic systolic (congestive) heart failure: Secondary | ICD-10-CM | POA: Diagnosis not present

## 2018-07-28 LAB — BASIC METABOLIC PANEL
BUN/Creatinine Ratio: 15 (ref 10–24)
BUN: 31 mg/dL — ABNORMAL HIGH (ref 8–27)
CO2: 28 mmol/L (ref 20–29)
Calcium: 9.1 mg/dL (ref 8.6–10.2)
Chloride: 99 mmol/L (ref 96–106)
Creatinine, Ser: 2.01 mg/dL — ABNORMAL HIGH (ref 0.76–1.27)
GFR calc Af Amer: 40 mL/min/{1.73_m2} — ABNORMAL LOW (ref >59)
GFR calc non Af Amer: 34 mL/min/{1.73_m2} — ABNORMAL LOW (ref >59)
Glucose: 88 mg/dL (ref 65–99)
Potassium: 4.6 mmol/L (ref 3.5–5.2)
Sodium: 141 mmol/L (ref 134–144)

## 2018-07-29 ENCOUNTER — Other Ambulatory Visit: Payer: Self-pay | Admitting: Cardiology

## 2018-07-29 DIAGNOSIS — R0681 Apnea, not elsewhere classified: Secondary | ICD-10-CM | POA: Diagnosis not present

## 2018-07-29 DIAGNOSIS — I4891 Unspecified atrial fibrillation: Secondary | ICD-10-CM | POA: Diagnosis not present

## 2018-07-29 DIAGNOSIS — G4719 Other hypersomnia: Secondary | ICD-10-CM | POA: Diagnosis not present

## 2018-07-29 DIAGNOSIS — I5023 Acute on chronic systolic (congestive) heart failure: Secondary | ICD-10-CM

## 2018-07-29 DIAGNOSIS — R0683 Snoring: Secondary | ICD-10-CM | POA: Diagnosis not present

## 2018-07-31 ENCOUNTER — Ambulatory Visit (INDEPENDENT_AMBULATORY_CARE_PROVIDER_SITE_OTHER): Payer: BC Managed Care – PPO | Admitting: Cardiology

## 2018-07-31 ENCOUNTER — Other Ambulatory Visit: Payer: Self-pay

## 2018-07-31 ENCOUNTER — Encounter: Payer: Self-pay | Admitting: Cardiology

## 2018-07-31 VITALS — BP 144/79 | HR 70 | Temp 98.6°F | Ht 71.0 in | Wt 326.0 lb

## 2018-07-31 DIAGNOSIS — I4811 Longstanding persistent atrial fibrillation: Secondary | ICD-10-CM | POA: Diagnosis not present

## 2018-07-31 DIAGNOSIS — I129 Hypertensive chronic kidney disease with stage 1 through stage 4 chronic kidney disease, or unspecified chronic kidney disease: Secondary | ICD-10-CM

## 2018-07-31 DIAGNOSIS — I5022 Chronic systolic (congestive) heart failure: Secondary | ICD-10-CM | POA: Diagnosis not present

## 2018-07-31 DIAGNOSIS — N183 Chronic kidney disease, stage 3 unspecified: Secondary | ICD-10-CM

## 2018-07-31 MED ORDER — APIXABAN 5 MG PO TABS: 5.0000 mg | ORAL_TABLET | Freq: Two times a day (BID) | ORAL | 3 refills | Status: DC

## 2018-07-31 MED ORDER — LOSARTAN POTASSIUM 25 MG PO TABS: 25.0000 mg | ORAL_TABLET | Freq: Every day | ORAL | 1 refills | Status: DC

## 2018-07-31 MED ORDER — FUROSEMIDE 80 MG PO TABS: 80.0000 mg | ORAL_TABLET | Freq: Two times a day (BID) | ORAL | 2 refills | Status: DC

## 2018-07-31 NOTE — Progress Notes (Signed)
Follow up visit  Subjective:   Stephen Maldonado, male    DOB: 1955/09/03, 63 y.o.   MRN: [id number]   Chief Complaint  Patient presents with  . Hypertension    f/u labs pt needs refills    HPI  63 y.o. Caucasian male with hypertension, hyperlipidemia, morbid obesity, referred for management of atrial fibrillation, heart failure.  Patient underwent RHC/LHC, that showed nonobstructive (FFR 0.97 in LCx) CAD, suggesting nonischemic cardiomyopathy. He could not tolerate Entresto due to cough. Thus, he was started on losartan 50 mg daily. His breathing has improved, but leg edema continues to worsen. His Cr has increased to 2.0.  Past Medical History:  Diagnosis Date  . Arthritis    knees  . Bladder spasms   . BPH (benign prostatic hyperplasia)   . DVT (deep venous thrombosis) ([place]) ocyt 2016   left leg tx medically  . GERD (gastroesophageal reflux disease)   . History of colon polyps    hyperplastic 06-14-2010  . Hydronephrosis   . Hypertension   . Obesity, morbid ([place])   . Urinary retention      Past Surgical History:  Procedure Laterality Date  . COLONOSCOPY  06/14/2010  . CORONARY ANGIOGRAPHY N/A 07/11/2016   Procedure: Coronary Angiography;  Surgeon: Dixie Dials, MD;  Location: [facility];  Service: Cardiovascular;  Laterality: N/A;  . CYSTOSCOPY W/ RETROGRADES Bilateral 10/03/2015   Procedure: CYSTOSCOPY WITH RETROGRADE PYELOGRAM;  Surgeon: [name], MD;  Location: [facility];  Service: Urology;  Laterality: Bilateral;  . GREEN LIGHT LASER TURP (TRANSURETHRAL RESECTION OF PROSTATE N/A 10/03/2015   Procedure: GREEN LIGHT LASER,  TURP - STAGED(TRANSURETHRAL RESECTION OF PROSTATE;  Surgeon: [name], MD;  Location: [facility];  Service: Urology;  Laterality: N/A;  . INTRAVASCULAR PRESSURE WIRE/FFR STUDY N/A 07/14/2018   Procedure: INTRAVASCULAR PRESSURE WIRE/FFR STUDY;  Surgeon: Nigel Mormon, MD;  Location: [facility];  Service: Cardiovascular;  Laterality: N/A;  . KNEE ARTHROSCOPY     not sure which knee  . LEFT HEART CATH AND CORONARY ANGIOGRAPHY N/A 07/14/2018   Procedure: LEFT HEART CATH AND CORONARY ANGIOGRAPHY;  Surgeon: Nigel Mormon, MD;  Location: [facility];  Service: Cardiovascular;  Laterality: N/A;  . QUADRICEPS TENDON REPAIR Bilateral left 02/24/2003/   right 2006  . TOTAL KNEE ARTHROPLASTY Right 12/01/2015   Procedure: RIGHT TOTAL KNEE ARTHROPLASTY;  Surgeon: [name], MD;  Location: [facility];  Service: Orthopedics;  Laterality: Right;  . TOTAL KNEE ARTHROPLASTY Left 01/10/2017   Procedure: LEFT TOTAL KNEE ARTHROPLASTY;  Surgeon: [name], MD;  Location: [facility];  Service: Orthopedics;  Laterality: Left;  . TOTAL SHOULDER ARTHROPLASTY Right 01/23/2018   Procedure: RIGHT REVERSE TOTAL SHOULDER ARTHROPLASTY;  Surgeon: Meredith Pel, MD;  Location: [facility];  Service: Orthopedics;  Laterality: Right;     Social History   Socioeconomic History  . Marital status: Married    Spouse name: Not on file  . Number of children: 1  . Years of education: Not on file  . Highest education level: Not on file  Occupational History  . Not on file  Social Needs  . Financial resource strain: Not on file  . Food insecurity    Worry: Not on file    Inability: Not on file  . Transportation needs    Medical: Not on file    Non-medical: Not on file  Tobacco Use  . Smoking status:  Never Smoker  . Smokeless tobacco: Former Systems developer    Types: Chew  Substance and Sexual Activity  . Alcohol use: Yes    Comment: 1-2 beers days  . Drug use: No  . Sexual activity: Not Currently  Lifestyle  . Physical activity    Days per week: Not on file    Minutes per session: Not on file  . Stress: Not on file  Relationships  . Social Herbalist on phone: Not on file    Gets together: Not on file    Attends religious service: Not on file    Active  member of club or organization: Not on file    Attends meetings of clubs or organizations: Not on file    Relationship status: Not on file  . Intimate partner violence    Fear of current or ex partner: Not on file    Emotionally abused: Not on file    Physically abused: Not on file    Forced sexual activity: Not on file  Other Topics Concern  . Not on file  Social History Narrative  . Not on file     Family History  Problem Relation Age of Onset  . CAD Mother   . Lung cancer Mother   . Breast cancer Mother   . Diabetes Mother   . Heart failure Father   . CAD Father   . Diabetes Father      Current Outpatient Medications on File Prior to Visit  Medication Sig Dispense Refill  . carvedilol (COREG) 6.25 MG tablet Take 0.5 tablets (3.125 mg total) by mouth 2 (two) times daily. 60 tablet 1  . Cholecalciferol (VITAMIN D) 50 MCG (2000 UT) tablet Take 2,000 Units by mouth daily.    . Coenzyme Q10 (COQ-10) 200 MG CAPS Take 200 mg by mouth daily.    . cyclobenzaprine (FLEXERIL) 10 MG tablet Take 10 mg by mouth as needed for muscle spasms.    . diphenhydrAMINE (BENADRYL) 25 MG tablet Take 25 mg by mouth at bedtime as needed for allergies or sleep.     . furosemide (LASIX) 80 MG tablet Take 80 mg by mouth daily.    . Garlic 8242 MG CAPS Take 1,000 mg by mouth daily.    . hydrALAZINE (APRESOLINE) 25 MG tablet Take 25 mg by mouth 2 (two) times daily with a meal.  3  . Ketotifen Fumarate (ALLERGY EYE DROPS OP) Place 1 drop into both eyes daily as needed (allergies).    . losartan (COZAAR) 50 MG tablet Take 1 tablet (50 mg total) by mouth daily. 30 tablet 1  . naproxen sodium (ALEVE) 220 MG tablet Take 220 mg by mouth at bedtime as needed (allergies).    . Omega-3 Fatty Acids (FISH OIL) 1000 MG CAPS Take 1,000 mg by mouth daily.     Marland Kitchen oxyCODONE (OXY IR/ROXICODONE) 5 MG immediate release tablet Take 2 tablets (10 mg total) by mouth every 4 (four) hours as needed for moderate pain (pain score  4-6). (Patient taking differently: Take 5 mg by mouth every 4 (four) hours as needed for moderate pain (pain score 4-6). ) 42 tablet 0  . Potassium Chloride ER 20 MEQ TBCR Take 1 tablet by mouth daily. with food    . rivaroxaban (XARELTO) 20 MG TABS tablet Take 20 mg by mouth daily with supper.    . tamsulosin (FLOMAX) 0.4 MG CAPS capsule Take 0.4 mg by mouth every morning.    Marland Kitchen tiZANidine (  ZANAFLEX) 4 MG tablet TAKE 1 TABLET (4 MG TOTAL) BY MOUTH EVERY 8 (EIGHT) HOURS AS NEEDED FOR MUSCLE SPASMS. 60 tablet 0  . Turmeric 500 MG TABS Take 500 mg by mouth daily.    . vitamin B-12 (CYANOCOBALAMIN) 1000 MCG tablet Take 1,000 mcg by mouth daily.    . vitamin C (ASCORBIC ACID) 500 MG tablet Take 500 mg by mouth daily.      Current Facility-Administered Medications on File Prior to Visit  Medication Dose Route Frequency Provider Last Rate Last Dose  . sacubitril-valsartan (ENTRESTO) 24-26 mg per tablet  1 tablet Oral BID Nigel Mormon, MD        Cardiovascular studies:  Left/right heart cath 07/14/2018: LM: Normal LAD: No significant disease LCx: Mid LCx focal 60% stenosis. dFR 0.97 (Non-significant) RCA: Prox 30%, mid 50% focal stenoses. LVEDP mildly elevated.  Impression: Nonobstructive CAD Nonischemic cardiomyopathy  EKG 07/01/2018: Afib with controlled ventricular rate  Echocardiogram 07/06/2018: Severely depressed LV systolic function with EF 20%. Left ventricle cavity is normal in size. Moderate concentric hypertrophy of the left ventricle. Severe global hypokinesis. Unable to evaluate diastolic function due to atrial fibrillation. Calculated EF 20%. Left atrial cavity is severely dilated at 5.4 cm. Mild aortic stenosis. Aortic valve mean gradient of 9 mmHg, Vmax of 2.0  m/s. Calculated aortic valve area by continuity equation is 1.7 cm.   Mild (Grade I) mitral regurgitation. Mild tricuspid regurgitation. Estimated pulmonary artery systolic pressure 20 mmHg.  Coronary  angiogram 07/11/2016: Prox-mid RCA 30% stenosis. Nonobstructive CAD  Echocardiogram 07/10/2016: - Mildly dilated LV with mild LV hypertrophy. EF 50%, mild diffuse   hypokinesis. Mildly dilated RV with normal systolic function.   Mild aortic stenosis.  Recent labs: Results for REYES, ALDACO (MRN [id number]) as of 07/31/2018 09:55  Ref. Range 07/16/2018 13:42 07/27/2018 81:10  BASIC METABOLIC PANEL Unknown Rpt (A) Rpt (A)  Sodium Latest Ref Range: 134 - 144 mmol/L 143 141  Potassium Latest Ref Range: 3.5 - 5.2 mmol/L 4.2 4.6  Chloride Latest Ref Range: 96 - 106 mmol/L 96 99  CO2 Latest Ref Range: 20 - 29 mmol/L 29 28  Glucose Latest Ref Range: 65 - 99 mg/dL 105 (H) 88  BUN Latest Ref Range: 8 - 27 mg/dL 25 31 (H)  Creatinine Latest Ref Range: 0.76 - 1.27 mg/dL 1.76 (H) 2.01 (H)  Calcium Latest Ref Range: 8.6 - 10.2 mg/dL 9.5 9.1  BUN/Creatinine Ratio Latest Ref Range: 10 - 24  14 15   GFR, Est Non African American Latest Ref Range: >59 mL/min/1.73 40 (L) 34 (L)  GFR, Est African American Latest Ref Range: >59 mL/min/1.73 47 (L) 40 (L)   07/16/2018: Glucose 10. BUN/Cr 25/1.76, eGFR 40. Na/K 143/4.2  07/01/2018:  Glucose 87. BUN/Cr 24/1.9. eGFR 36. CMP otherwise normal.  H/H 14/44. Platelets 163. BNP 164. Chol 161, TG 230, HDL 47, non-HDL cholesterol 114.   Review of Systems  Constitution: Negative for decreased appetite, malaise/fatigue, weight gain and weight loss.  HENT: Negative for congestion.   Eyes: Negative for visual disturbance.  Cardiovascular: Positive for dyspnea on exertion and leg swelling. Negative for chest pain, palpitations and syncope.  Respiratory: Positive for shortness of breath. Negative for cough.   Endocrine: Negative for cold intolerance.  Hematologic/Lymphatic: Does not bruise/bleed easily.  Skin: Negative for itching and rash.  Musculoskeletal: Negative for myalgias.  Gastrointestinal: Negative for abdominal pain, nausea and vomiting.  Genitourinary:  Negative for dysuria.  Neurological: Negative for dizziness and weakness.  Psychiatric/Behavioral: The patient is not nervous/anxious.   All other systems reviewed and are negative.        Vitals:   07/31/18 1033  BP: (!) 144/79  Pulse: 70  Temp: 98.6 F (37 C)  SpO2: 93%    Body mass index is 45.47 kg/m. Filed Weights   07/31/18 1033  Weight: (!) 326 lb (147.9 kg)     Objective:   Physical Exam  Constitutional: He is oriented to person, place, and time. He appears well-developed and well-nourished. No distress.  HENT:  Head: Normocephalic and atraumatic.  Eyes: Pupils are equal, round, and reactive to light. Conjunctivae are normal.  Neck: No JVD present.  Cardiovascular: Normal rate, regular rhythm and intact distal pulses.  Murmur heard.  Holosystolic murmur is present with a grade of 2/6. Pulmonary/Chest: Effort normal and breath sounds normal. He has no wheezes. He has no rales.  Abdominal: Soft. Bowel sounds are normal. There is no rebound.  Musculoskeletal:        General: Edema (1-2+ b/l) present.  Lymphadenopathy:    He has no cervical adenopathy.  Neurological: He is alert and oriented to person, place, and time. No cranial nerve deficit.  Skin: Skin is warm and dry.  Psychiatric: He has a normal mood and affect.  Nursing note and vitals reviewed.         Assessment & Recommendations:   63 y.o. Caucasian male with hypertension, hyperlipidemia, morbid obesity, persistent atrial fibrillation, heart failure.with reduced ejection fraction.  1. Chronic systolic heart failure ([place]): NYHA class II-III symptoms. Nonischemic cardiomyopathy Etiology multifactorial, including Afib, obesity, OSA. Worsening leg edema noted. Increasing Cr likely due to cardiorenal syndrome and congestion. This probably reflects basleine CKD< rather that AKI. Increase lasix to 80 mg bid.  Reduce losartan to 25 mg daily. Repeat BMP in 1 week.  Continue carvedilol to 6.25 mg bid.  Hydralazine 25 mg bid.   2. Persistent Afib: Rate controlled.  Likely long standing, as suggested by severe LA enlargement. I would defer cardioversion until after heart failure is adequately treated.  CHA2DS2VASc score 3, annual stroke risk 3 With worsening renal function, I have stopped his Xarelto and switched to eliquis 5 mg bid.  I will see him back on 07/06. If still in Afib, will schedule for cardioversion.  3. Essential hypertension Better controlled.   4. Morbid obesity ([place]) Diet and lifestyle modification, salt and calorie restriction diet.  5. Suspected OSA: Sleep study pending.   Nigel Mormon, MD [facility] Pager: [phone] Office: [phone] If no answer Cell [phone]

## 2018-08-03 DIAGNOSIS — L0390 Cellulitis, unspecified: Secondary | ICD-10-CM | POA: Diagnosis not present

## 2018-08-03 DIAGNOSIS — I4891 Unspecified atrial fibrillation: Secondary | ICD-10-CM | POA: Diagnosis not present

## 2018-08-03 DIAGNOSIS — I509 Heart failure, unspecified: Secondary | ICD-10-CM | POA: Diagnosis not present

## 2018-08-06 DIAGNOSIS — N183 Chronic kidney disease, stage 3 (moderate): Secondary | ICD-10-CM | POA: Diagnosis not present

## 2018-08-07 LAB — BASIC METABOLIC PANEL
BUN/Creatinine Ratio: 15 (ref 10–24)
BUN: 29 mg/dL — ABNORMAL HIGH (ref 8–27)
CO2: 27 mmol/L (ref 20–29)
Calcium: 9.2 mg/dL (ref 8.6–10.2)
Chloride: 98 mmol/L (ref 96–106)
Creatinine, Ser: 1.98 mg/dL — ABNORMAL HIGH (ref 0.76–1.27)
GFR calc Af Amer: 40 mL/min/{1.73_m2} — ABNORMAL LOW (ref >59)
GFR calc non Af Amer: 35 mL/min/{1.73_m2} — ABNORMAL LOW (ref >59)
Glucose: 80 mg/dL (ref 65–99)
Potassium: 4.8 mmol/L (ref 3.5–5.2)
Sodium: 142 mmol/L (ref 134–144)

## 2018-08-10 ENCOUNTER — Other Ambulatory Visit: Payer: Self-pay

## 2018-08-10 ENCOUNTER — Ambulatory Visit (INDEPENDENT_AMBULATORY_CARE_PROVIDER_SITE_OTHER): Payer: BC Managed Care – PPO | Admitting: Cardiology

## 2018-08-10 ENCOUNTER — Encounter: Payer: Self-pay | Admitting: Cardiology

## 2018-08-10 VITALS — BP 138/99 | HR 80 | Ht 71.0 in | Wt 326.0 lb

## 2018-08-10 DIAGNOSIS — I480 Paroxysmal atrial fibrillation: Secondary | ICD-10-CM | POA: Diagnosis not present

## 2018-08-10 MED ORDER — CARVEDILOL 6.25 MG PO TABS: 3.1250 mg | ORAL_TABLET | Freq: Two times a day (BID) | ORAL | 2 refills | Status: DC

## 2018-08-10 NOTE — Progress Notes (Signed)
 Follow up visit  Subjective:   Stephen Maldonado, male    DOB: 01/05/1956, 62 y.o.   MRN: [id number]   Chief Complaint  Patient presents with  . Cardiomyopathy    HPI  62 y.o. Caucasian male with hypertension, hyperlipidemia, morbid obesity, referred for management of atrial fibrillation, heart failure.  His breathing is stable. Leg edema still persists. He ran out of his coreg two days ago. Renal function is stable.   Past Medical History:  Diagnosis Date  . Arthritis    knees  . Bladder spasms   . BPH (benign prostatic hyperplasia)   . DVT (deep venous thrombosis) ([facility]) ocyt 2016   left leg tx medically  . GERD (gastroesophageal reflux disease)   . History of colon polyps    hyperplastic 06-14-2010  . Hydronephrosis   . Hypertension   . Obesity, morbid ([facility])   . Urinary retention      Past Surgical History:  Procedure Laterality Date  . COLONOSCOPY  06/14/2010  . CORONARY ANGIOGRAPHY N/A 07/11/2016   Procedure: Coronary Angiography;  Surgeon: Kadakia, Ajay, MD;  Location: [facility];  Service: Cardiovascular;  Laterality: N/A;  . CYSTOSCOPY W/ RETROGRADES Bilateral 10/03/2015   Procedure: CYSTOSCOPY WITH RETROGRADE PYELOGRAM;  Surgeon: Matthew Eskridge, MD;  Location: [facility];  Service: Urology;  Laterality: Bilateral;  . GREEN LIGHT LASER TURP (TRANSURETHRAL RESECTION OF PROSTATE N/A 10/03/2015   Procedure: GREEN LIGHT LASER,  TURP - STAGED(TRANSURETHRAL RESECTION OF PROSTATE;  Surgeon: Matthew Eskridge, MD;  Location: [facility];  Service: Urology;  Laterality: N/A;  . INTRAVASCULAR PRESSURE WIRE/FFR STUDY N/A 07/14/2018   Procedure: INTRAVASCULAR PRESSURE WIRE/FFR STUDY;  Surgeon: Jakyrah Holladay J, MD;  Location: [facility];  Service: Cardiovascular;  Laterality: N/A;  . KNEE ARTHROSCOPY     not sure which knee  . LEFT HEART CATH AND CORONARY ANGIOGRAPHY N/A 07/14/2018   Procedure: LEFT HEART CATH AND CORONARY  ANGIOGRAPHY;  Surgeon: Brittanya Winburn J, MD;  Location: [facility];  Service: Cardiovascular;  Laterality: N/A;  . QUADRICEPS TENDON REPAIR Bilateral left 02/24/2003/   right 2006  . TOTAL KNEE ARTHROPLASTY Right 12/01/2015   Procedure: RIGHT TOTAL KNEE ARTHROPLASTY;  Surgeon: Christopher Y Blackman, MD;  Location: [facility];  Service: Orthopedics;  Laterality: Right;  . TOTAL KNEE ARTHROPLASTY Left 01/10/2017   Procedure: LEFT TOTAL KNEE ARTHROPLASTY;  Surgeon: Blackman, Christopher Y, MD;  Location: [facility];  Service: Orthopedics;  Laterality: Left;  . TOTAL SHOULDER ARTHROPLASTY Right 01/23/2018   Procedure: RIGHT REVERSE TOTAL SHOULDER ARTHROPLASTY;  Surgeon: Dean, Gregory Scott, MD;  Location: [facility];  Service: Orthopedics;  Laterality: Right;     Social History   Socioeconomic History  . Marital status: Married    Spouse name: Not on file  . Number of children: 1  . Years of education: Not on file  . Highest education level: Not on file  Occupational History  . Not on file  Social Needs  . Financial resource strain: Not on file  . Food insecurity    Worry: Not on file    Inability: Not on file  . Transportation needs    Medical: Not on file    Non-medical: Not on file  Tobacco Use  . Smoking status: Never Smoker  . Smokeless tobacco: Former User    Types: Chew  Substance and Sexual Activity  . Alcohol use: Yes    Comment: 1-2 beers days  . Drug   use: No  . Sexual activity: Not Currently  Lifestyle  . Physical activity    Days per week: Not on file    Minutes per session: Not on file  . Stress: Not on file  Relationships  . Social connections    Talks on phone: Not on file    Gets together: Not on file    Attends religious service: Not on file    Active member of club or organization: Not on file    Attends meetings of clubs or organizations: Not on file    Relationship status: Not on file  . Intimate partner violence    Fear of current or ex partner: Not  on file    Emotionally abused: Not on file    Physically abused: Not on file    Forced sexual activity: Not on file  Other Topics Concern  . Not on file  Social History Narrative  . Not on file     Family History  Problem Relation Age of Onset  . CAD Mother   . Lung cancer Mother   . Breast cancer Mother   . Diabetes Mother   . Heart failure Father   . CAD Father   . Diabetes Father      Current Outpatient Medications on File Prior to Visit  Medication Sig Dispense Refill  . amoxicillin-clavulanate (AUGMENTIN) 875-125 MG tablet Take 1 tablet by mouth 2 (two) times daily. for 10 days    . apixaban (ELIQUIS) 5 MG TABS tablet Take 1 tablet (5 mg total) by mouth 2 (two) times daily. 60 tablet 3  . carvedilol (COREG) 3.125 MG tablet Take 3.125 mg by mouth 2 (two) times daily with a meal.    . Cholecalciferol (VITAMIN D) 50 MCG (2000 UT) tablet Take 2,000 Units by mouth daily.    . Coenzyme Q10 (COQ-10) 200 MG CAPS Take 200 mg by mouth daily.    . cyclobenzaprine (FLEXERIL) 10 MG tablet Take 10 mg by mouth as needed for muscle spasms.    . furosemide (LASIX) 80 MG tablet Take 1 tablet (80 mg total) by mouth 2 (two) times daily. 90 tablet 2  . Garlic 1000 MG CAPS Take 1,000 mg by mouth daily.    . hydrALAZINE (APRESOLINE) 25 MG tablet Take 25 mg by mouth 2 (two) times daily with a meal.  3  . Ketotifen Fumarate (ALLERGY EYE DROPS OP) Place 1 drop into both eyes daily as needed (allergies).    . losartan (COZAAR) 25 MG tablet Take 1 tablet (25 mg total) by mouth daily. (Patient taking differently: Take 12.5 mg by mouth 2 (two) times a day. ) 30 tablet 1  . Omega-3 Fatty Acids (FISH OIL) 1000 MG CAPS Take 1,000 mg by mouth daily.     . oxyCODONE (OXY IR/ROXICODONE) 5 MG immediate release tablet Take 2 tablets (10 mg total) by mouth every 4 (four) hours as needed for moderate pain (pain score 4-6). (Patient taking differently: Take 5 mg by mouth every 4 (four) hours as needed for moderate  pain (pain score 4-6). ) 42 tablet 0  . Potassium Chloride ER 20 MEQ TBCR Take 1 tablet by mouth daily. with food    . tamsulosin (FLOMAX) 0.4 MG CAPS capsule Take 0.4 mg by mouth every morning.    . tiZANidine (ZANAFLEX) 4 MG tablet TAKE 1 TABLET (4 MG TOTAL) BY MOUTH EVERY 8 (EIGHT) HOURS AS NEEDED FOR MUSCLE SPASMS. 60 tablet 0  . Turmeric 500 MG TABS   Take 500 mg by mouth daily.    . vitamin B-12 (CYANOCOBALAMIN) 1000 MCG tablet Take 1,000 mcg by mouth daily.    . vitamin C (ASCORBIC ACID) 500 MG tablet Take 500 mg by mouth daily.      No current facility-administered medications on file prior to visit.     Cardiovascular studies:  EKG 08/10/2018: Atrial fibrillation with RVR. No significant change compared to previous EKG.   Left/right heart cath 07/14/2018: LM: Normal LAD: No significant disease LCx: Mid LCx focal 60% stenosis. dFR 0.97 (Non-significant) RCA: Prox 30%, mid 50% focal stenoses. LVEDP mildly elevated.  Impression: Nonobstructive CAD Nonischemic cardiomyopathy  EKG 07/01/2018: Afib with controlled ventricular rate  Echocardiogram 07/06/2018: Severely depressed LV systolic function with EF 20%. Left ventricle cavity is normal in size. Moderate concentric hypertrophy of the left ventricle. Severe global hypokinesis. Unable to evaluate diastolic function due to atrial fibrillation. Calculated EF 20%. Left atrial cavity is severely dilated at 5.4 cm. Mild aortic stenosis. Aortic valve mean gradient of 9 mmHg, Vmax of 2.0  m/s. Calculated aortic valve area by continuity equation is 1.7 cm.   Mild (Grade I) mitral regurgitation. Mild tricuspid regurgitation. Estimated pulmonary artery systolic pressure 20 mmHg.  Coronary angiogram 07/11/2016: Prox-mid RCA 30% stenosis. Nonobstructive CAD  Echocardiogram 07/10/2016: - Mildly dilated LV with mild LV hypertrophy. EF 50%, mild diffuse   hypokinesis. Mildly dilated RV with normal systolic function.   Mild aortic  stenosis.  Recent labs: Results for Regal, Lundy A (MRN [id number]) as of 08/10/2018 11:01  Ref. Range 07/27/2018 12:01 08/06/2018 12:27  BASIC METABOLIC PANEL Unknown Rpt (A) Rpt (A)  Sodium Latest Ref Range: 134 - 144 mmol/L 141 142  Potassium Latest Ref Range: 3.5 - 5.2 mmol/L 4.6 4.8  Chloride Latest Ref Range: 96 - 106 mmol/L 99 98  CO2 Latest Ref Range: 20 - 29 mmol/L 28 27  Glucose Latest Ref Range: 65 - 99 mg/dL 88 80  BUN Latest Ref Range: 8 - 27 mg/dL 31 (H) 29 (H)  Creatinine Latest Ref Range: 0.76 - 1.27 mg/dL 2.01 (H) 1.98 (H)  Calcium Latest Ref Range: 8.6 - 10.2 mg/dL 9.1 9.2  BUN/Creatinine Ratio Latest Ref Range: 10 - 24  15 15  GFR, Est Non African American Latest Ref Range: >59 mL/min/1.73 34 (L) 35 (L)  GFR, Est African American Latest Ref Range: >59 mL/min/1.73 40 (L) 40 (L)     07/16/2018: Glucose 10. BUN/Cr 25/1.76, eGFR 40. Na/K 143/4.2  07/01/2018:  Glucose 87. BUN/Cr 24/1.9. eGFR 36. CMP otherwise normal.  H/H 14/44. Platelets 163. BNP 164. Chol 161, TG 230, HDL 47, non-HDL cholesterol 114.   Review of Systems  Constitution: Negative for decreased appetite, malaise/fatigue, weight gain and weight loss.  HENT: Negative for congestion.   Eyes: Negative for visual disturbance.  Cardiovascular: Positive for dyspnea on exertion and leg swelling. Negative for chest pain, palpitations and syncope.  Respiratory: Positive for shortness of breath. Negative for cough.   Endocrine: Negative for cold intolerance.  Hematologic/Lymphatic: Does not bruise/bleed easily.  Skin: Negative for itching and rash.  Musculoskeletal: Negative for myalgias.  Gastrointestinal: Negative for abdominal pain, nausea and vomiting.  Genitourinary: Negative for dysuria.  Neurological: Negative for dizziness and weakness.  Psychiatric/Behavioral: The patient is not nervous/anxious.   All other systems reviewed and are negative.        Vitals:   08/10/18 1044  BP: (!) 138/99   Pulse: 80  SpO2: 92%    Body mass   index is 45.47 kg/m. Filed Weights   08/10/18 1044  Weight: (!) 326 lb (147.9 kg)     Objective:   Physical Exam  Constitutional: He is oriented to person, place, and time. He appears well-developed and well-nourished. No distress.  HENT:  Head: Normocephalic and atraumatic.  Eyes: Pupils are equal, round, and reactive to light. Conjunctivae are normal.  Neck: No JVD present.  Cardiovascular: Normal rate, regular rhythm and intact distal pulses.  Murmur heard.  Holosystolic murmur is present with a grade of 2/6. Pulmonary/Chest: Effort normal and breath sounds normal. He has no wheezes. He has no rales.  Abdominal: Soft. Bowel sounds are normal. There is no rebound.  Musculoskeletal:        General: Edema (1-2+ b/l) present.  Lymphadenopathy:    He has no cervical adenopathy.  Neurological: He is alert and oriented to person, place, and time. No cranial nerve deficit.  Skin: Skin is warm and dry.  Psychiatric: He has a normal mood and affect.  Nursing note and vitals reviewed.         Assessment & Recommendations:   62 y.o. Caucasian male with hypertension, hyperlipidemia, morbid obesity, persistent atrial fibrillation, heart failure.with reduced ejection fraction.  1. Chronic systolic heart failure ([facility]): NYHA class II-III symptoms. Nonischemic cardiomyopathy Etiology multifactorial, including Afib, obesity, OSA. Real function stable with CKD stage 3.  Continue lasix to 80 mg bid, losartan to 25 mg daily, carvedilol to 6.25 mg bid, hydralazine 25 mg bid.   2. Persistent Afib: Rate controlled.  Likely long standing, as suggested by severe LA enlargement. CHA2DS2VASc score 3, annual stroke risk 3% Will proceed with cardioversion. Continue eliquis 5 mg bid.   3. Essential hypertension Better controlled.   4. Morbid obesity ([facility]) Diet and lifestyle modification, salt and calorie restriction diet.  5. Suspected OSA: Sleep  study pending.   Sylvester Salonga J Yunis Voorheis, MD [facility] Pager: [phone] Office: [phone] If no answer Cell [phone]   

## 2018-08-10 NOTE — H&P (View-Only) (Signed)
Follow up visit  Subjective:   Stephen Maldonado, male    DOB: 06/03/1955, 63 y.o.   MRN: [id number]   Chief Complaint  Patient presents with  . Cardiomyopathy    HPI  63 y.o. Caucasian male with hypertension, hyperlipidemia, morbid obesity, referred for management of atrial fibrillation, heart failure.  His breathing is stable. Leg edema still persists. He ran out of his coreg two days ago. Renal function is stable.   Past Medical History:  Diagnosis Date  . Arthritis    knees  . Bladder spasms   . BPH (benign prostatic hyperplasia)   . DVT (deep venous thrombosis) ([place]) ocyt 2016   left leg tx medically  . GERD (gastroesophageal reflux disease)   . History of colon polyps    hyperplastic 06-14-2010  . Hydronephrosis   . Hypertension   . Obesity, morbid ([place])   . Urinary retention      Past Surgical History:  Procedure Laterality Date  . COLONOSCOPY  06/14/2010  . CORONARY ANGIOGRAPHY N/A 07/11/2016   Procedure: Coronary Angiography;  Surgeon: Dixie Dials, MD;  Location: [facility];  Service: Cardiovascular;  Laterality: N/A;  . CYSTOSCOPY W/ RETROGRADES Bilateral 10/03/2015   Procedure: CYSTOSCOPY WITH RETROGRADE PYELOGRAM;  Surgeon: [name], MD;  Location: [facility];  Service: Urology;  Laterality: Bilateral;  . GREEN LIGHT LASER TURP (TRANSURETHRAL RESECTION OF PROSTATE N/A 10/03/2015   Procedure: GREEN LIGHT LASER,  TURP - STAGED(TRANSURETHRAL RESECTION OF PROSTATE;  Surgeon: [name], MD;  Location: [facility];  Service: Urology;  Laterality: N/A;  . INTRAVASCULAR PRESSURE WIRE/FFR STUDY N/A 07/14/2018   Procedure: INTRAVASCULAR PRESSURE WIRE/FFR STUDY;  Surgeon: Nigel Mormon, MD;  Location: [facility];  Service: Cardiovascular;  Laterality: N/A;  . KNEE ARTHROSCOPY     not sure which knee  . LEFT HEART CATH AND CORONARY ANGIOGRAPHY N/A 07/14/2018   Procedure: LEFT HEART CATH AND CORONARY  ANGIOGRAPHY;  Surgeon: Nigel Mormon, MD;  Location: [facility];  Service: Cardiovascular;  Laterality: N/A;  . QUADRICEPS TENDON REPAIR Bilateral left 02/24/2003/   right 2006  . TOTAL KNEE ARTHROPLASTY Right 12/01/2015   Procedure: RIGHT TOTAL KNEE ARTHROPLASTY;  Surgeon: [name], MD;  Location: [facility];  Service: Orthopedics;  Laterality: Right;  . TOTAL KNEE ARTHROPLASTY Left 01/10/2017   Procedure: LEFT TOTAL KNEE ARTHROPLASTY;  Surgeon: [name], MD;  Location: [facility];  Service: Orthopedics;  Laterality: Left;  . TOTAL SHOULDER ARTHROPLASTY Right 01/23/2018   Procedure: RIGHT REVERSE TOTAL SHOULDER ARTHROPLASTY;  Surgeon: Meredith Pel, MD;  Location: [facility];  Service: Orthopedics;  Laterality: Right;     Social History   Socioeconomic History  . Marital status: Married    Spouse name: Not on file  . Number of children: 1  . Years of education: Not on file  . Highest education level: Not on file  Occupational History  . Not on file  Social Needs  . Financial resource strain: Not on file  . Food insecurity    Worry: Not on file    Inability: Not on file  . Transportation needs    Medical: Not on file    Non-medical: Not on file  Tobacco Use  . Smoking status: Never Smoker  . Smokeless tobacco: Former Systems developer    Types: Chew  Substance and Sexual Activity  . Alcohol use: Yes    Comment: 1-2 beers days  . Drug  use: No  . Sexual activity: Not Currently  Lifestyle  . Physical activity    Days per week: Not on file    Minutes per session: Not on file  . Stress: Not on file  Relationships  . Social Herbalist on phone: Not on file    Gets together: Not on file    Attends religious service: Not on file    Active member of club or organization: Not on file    Attends meetings of clubs or organizations: Not on file    Relationship status: Not on file  . Intimate partner violence    Fear of current or ex partner: Not  on file    Emotionally abused: Not on file    Physically abused: Not on file    Forced sexual activity: Not on file  Other Topics Concern  . Not on file  Social History Narrative  . Not on file     Family History  Problem Relation Age of Onset  . CAD Mother   . Lung cancer Mother   . Breast cancer Mother   . Diabetes Mother   . Heart failure Father   . CAD Father   . Diabetes Father      Current Outpatient Medications on File Prior to Visit  Medication Sig Dispense Refill  . amoxicillin-clavulanate (AUGMENTIN) 875-125 MG tablet Take 1 tablet by mouth 2 (two) times daily. for 10 days    . apixaban (ELIQUIS) 5 MG TABS tablet Take 1 tablet (5 mg total) by mouth 2 (two) times daily. 60 tablet 3  . carvedilol (COREG) 3.125 MG tablet Take 3.125 mg by mouth 2 (two) times daily with a meal.    . Cholecalciferol (VITAMIN D) 50 MCG (2000 UT) tablet Take 2,000 Units by mouth daily.    . Coenzyme Q10 (COQ-10) 200 MG CAPS Take 200 mg by mouth daily.    . cyclobenzaprine (FLEXERIL) 10 MG tablet Take 10 mg by mouth as needed for muscle spasms.    . furosemide (LASIX) 80 MG tablet Take 1 tablet (80 mg total) by mouth 2 (two) times daily. 90 tablet 2  . Garlic 8889 MG CAPS Take 1,000 mg by mouth daily.    . hydrALAZINE (APRESOLINE) 25 MG tablet Take 25 mg by mouth 2 (two) times daily with a meal.  3  . Ketotifen Fumarate (ALLERGY EYE DROPS OP) Place 1 drop into both eyes daily as needed (allergies).    . losartan (COZAAR) 25 MG tablet Take 1 tablet (25 mg total) by mouth daily. (Patient taking differently: Take 12.5 mg by mouth 2 (two) times a day. ) 30 tablet 1  . Omega-3 Fatty Acids (FISH OIL) 1000 MG CAPS Take 1,000 mg by mouth daily.     Marland Kitchen oxyCODONE (OXY IR/ROXICODONE) 5 MG immediate release tablet Take 2 tablets (10 mg total) by mouth every 4 (four) hours as needed for moderate pain (pain score 4-6). (Patient taking differently: Take 5 mg by mouth every 4 (four) hours as needed for moderate  pain (pain score 4-6). ) 42 tablet 0  . Potassium Chloride ER 20 MEQ TBCR Take 1 tablet by mouth daily. with food    . tamsulosin (FLOMAX) 0.4 MG CAPS capsule Take 0.4 mg by mouth every morning.    Marland Kitchen tiZANidine (ZANAFLEX) 4 MG tablet TAKE 1 TABLET (4 MG TOTAL) BY MOUTH EVERY 8 (EIGHT) HOURS AS NEEDED FOR MUSCLE SPASMS. 60 tablet 0  . Turmeric 500 MG TABS  Take 500 mg by mouth daily.    . vitamin B-12 (CYANOCOBALAMIN) 1000 MCG tablet Take 1,000 mcg by mouth daily.    . vitamin C (ASCORBIC ACID) 500 MG tablet Take 500 mg by mouth daily.      No current facility-administered medications on file prior to visit.     Cardiovascular studies:  EKG 08/10/2018: Atrial fibrillation with RVR. No significant change compared to previous EKG.   Left/right heart cath 07/14/2018: LM: Normal LAD: No significant disease LCx: Mid LCx focal 60% stenosis. dFR 0.97 (Non-significant) RCA: Prox 30%, mid 50% focal stenoses. LVEDP mildly elevated.  Impression: Nonobstructive CAD Nonischemic cardiomyopathy  EKG 07/01/2018: Afib with controlled ventricular rate  Echocardiogram 07/06/2018: Severely depressed LV systolic function with EF 20%. Left ventricle cavity is normal in size. Moderate concentric hypertrophy of the left ventricle. Severe global hypokinesis. Unable to evaluate diastolic function due to atrial fibrillation. Calculated EF 20%. Left atrial cavity is severely dilated at 5.4 cm. Mild aortic stenosis. Aortic valve mean gradient of 9 mmHg, Vmax of 2.0  m/s. Calculated aortic valve area by continuity equation is 1.7 cm.   Mild (Grade I) mitral regurgitation. Mild tricuspid regurgitation. Estimated pulmonary artery systolic pressure 20 mmHg.  Coronary angiogram 07/11/2016: Prox-mid RCA 30% stenosis. Nonobstructive CAD  Echocardiogram 07/10/2016: - Mildly dilated LV with mild LV hypertrophy. EF 50%, mild diffuse   hypokinesis. Mildly dilated RV with normal systolic function.   Mild aortic  stenosis.  Recent labs: Results for DEMARI, GALES (MRN [id number]) as of 08/10/2018 11:01  Ref. Range 07/27/2018 12:01 08/06/2018 71:24  BASIC METABOLIC PANEL Unknown Rpt (A) Rpt (A)  Sodium Latest Ref Range: 134 - 144 mmol/L 141 142  Potassium Latest Ref Range: 3.5 - 5.2 mmol/L 4.6 4.8  Chloride Latest Ref Range: 96 - 106 mmol/L 99 98  CO2 Latest Ref Range: 20 - 29 mmol/L 28 27  Glucose Latest Ref Range: 65 - 99 mg/dL 88 80  BUN Latest Ref Range: 8 - 27 mg/dL 31 (H) 29 (H)  Creatinine Latest Ref Range: 0.76 - 1.27 mg/dL 2.01 (H) 1.98 (H)  Calcium Latest Ref Range: 8.6 - 10.2 mg/dL 9.1 9.2  BUN/Creatinine Ratio Latest Ref Range: 10 - _0 GFR, Est Non African American Latest Ref Range: >59 mL/min/1.73 34 (L) 35 (L)  GFR, Est African American Latest Ref Range: >59 mL/min/1.73 40 (L) 40 (L)     07/16/2018: Glucose 10. BUN/Cr 25/1.76, eGFR 40. Na/K 143/4.2  07/01/2018:  Glucose 87. BUN/Cr 24/1.9. eGFR 36. CMP otherwise normal.  H/H 14/44. Platelets 163. BNP 164. Chol 161, TG 230, HDL 47, non-HDL cholesterol 114.   Review of Systems  Constitution: Negative for decreased appetite, malaise/fatigue, weight gain and weight loss.  HENT: Negative for congestion.   Eyes: Negative for visual disturbance.  Cardiovascular: Positive for dyspnea on exertion and leg swelling. Negative for chest pain, palpitations and syncope.  Respiratory: Positive for shortness of breath. Negative for cough.   Endocrine: Negative for cold intolerance.  Hematologic/Lymphatic: Does not bruise/bleed easily.  Skin: Negative for itching and rash.  Musculoskeletal: Negative for myalgias.  Gastrointestinal: Negative for abdominal pain, nausea and vomiting.  Genitourinary: Negative for dysuria.  Neurological: Negative for dizziness and weakness.  Psychiatric/Behavioral: The patient is not nervous/anxious.   All other systems reviewed and are negative.        Vitals:   08/10/18 1044  BP: (!) 138/99   Pulse: 80  SpO2: 92%    Body mass  index is 45.47 kg/m. Filed Weights   08/10/18 1044  Weight: (!) 326 lb (147.9 kg)     Objective:   Physical Exam  Constitutional: He is oriented to person, place, and time. He appears well-developed and well-nourished. No distress.  HENT:  Head: Normocephalic and atraumatic.  Eyes: Pupils are equal, round, and reactive to light. Conjunctivae are normal.  Neck: No JVD present.  Cardiovascular: Normal rate, regular rhythm and intact distal pulses.  Murmur heard.  Holosystolic murmur is present with a grade of 2/6. Pulmonary/Chest: Effort normal and breath sounds normal. He has no wheezes. He has no rales.  Abdominal: Soft. Bowel sounds are normal. There is no rebound.  Musculoskeletal:        General: Edema (1-2+ b/l) present.  Lymphadenopathy:    He has no cervical adenopathy.  Neurological: He is alert and oriented to person, place, and time. No cranial nerve deficit.  Skin: Skin is warm and dry.  Psychiatric: He has a normal mood and affect.  Nursing note and vitals reviewed.         Assessment & Recommendations:   63 y.o. Caucasian male with hypertension, hyperlipidemia, morbid obesity, persistent atrial fibrillation, heart failure.with reduced ejection fraction.  1. Chronic systolic heart failure ([place]): NYHA class II-III symptoms. Nonischemic cardiomyopathy Etiology multifactorial, including Afib, obesity, OSA. Real function stable with CKD stage 3.  Continue lasix to 80 mg bid, losartan to 25 mg daily, carvedilol to 6.25 mg bid, hydralazine 25 mg bid.   2. Persistent Afib: Rate controlled.  Likely long standing, as suggested by severe LA enlargement. CHA2DS2VASc score 3, annual stroke risk 3% Will proceed with cardioversion. Continue eliquis 5 mg bid.   3. Essential hypertension Better controlled.   4. Morbid obesity ([place]) Diet and lifestyle modification, salt and calorie restriction diet.  5. Suspected OSA: Sleep  study pending.   Nigel Mormon, MD [facility] Pager: [phone] Office: [phone] If no answer Cell [phone]

## 2018-08-11 ENCOUNTER — Other Ambulatory Visit (HOSPITAL_COMMUNITY)
Admission: RE | Admit: 2018-08-11 | Discharge: 2018-08-11 | Disposition: A | Discharge status: Home / Self Care | Payer: BC Managed Care – PPO | Source: Ambulatory Visit | Attending: Cardiology | Admitting: Cardiology

## 2018-08-11 DIAGNOSIS — Z1159 Encounter for screening for other viral diseases: Secondary | ICD-10-CM | POA: Diagnosis not present

## 2018-08-11 DIAGNOSIS — Z01812 Encounter for preprocedural laboratory examination: Secondary | ICD-10-CM | POA: Diagnosis not present

## 2018-08-11 LAB — SARS CORONAVIRUS 2 (TAT 6-24 HRS): SARS Coronavirus 2: NEGATIVE

## 2018-08-13 NOTE — Progress Notes (Signed)
No answer when attempt to contact patient to check quarantine status.

## 2018-08-14 ENCOUNTER — Ambulatory Visit (HOSPITAL_COMMUNITY): Payer: BC Managed Care – PPO | Admitting: Registered Nurse

## 2018-08-14 ENCOUNTER — Ambulatory Visit (HOSPITAL_COMMUNITY)
Admission: RE | Admit: 2018-08-14 | Discharge: 2018-08-14 | Disposition: A | Discharge status: Home / Self Care | Payer: BC Managed Care – PPO | Attending: Cardiology | Admitting: Cardiology

## 2018-08-14 ENCOUNTER — Encounter (HOSPITAL_COMMUNITY): Payer: Self-pay | Admitting: *Deleted

## 2018-08-14 ENCOUNTER — Encounter (HOSPITAL_COMMUNITY)
Admission: RE | Disposition: A | Discharge status: Home / Self Care | Payer: Self-pay | Source: Home / Self Care | Attending: Cardiology

## 2018-08-14 ENCOUNTER — Other Ambulatory Visit: Payer: Self-pay

## 2018-08-14 DIAGNOSIS — I4819 Other persistent atrial fibrillation: Secondary | ICD-10-CM

## 2018-08-14 DIAGNOSIS — Z86718 Personal history of other venous thrombosis and embolism: Secondary | ICD-10-CM | POA: Diagnosis not present

## 2018-08-14 DIAGNOSIS — Z7901 Long term (current) use of anticoagulants: Secondary | ICD-10-CM | POA: Diagnosis not present

## 2018-08-14 DIAGNOSIS — Z79899 Other long term (current) drug therapy: Secondary | ICD-10-CM | POA: Insufficient documentation

## 2018-08-14 DIAGNOSIS — K219 Gastro-esophageal reflux disease without esophagitis: Secondary | ICD-10-CM | POA: Diagnosis not present

## 2018-08-14 DIAGNOSIS — Z6841 Body Mass Index (BMI) 40.0 and over, adult: Secondary | ICD-10-CM | POA: Insufficient documentation

## 2018-08-14 DIAGNOSIS — N183 Chronic kidney disease, stage 3 (moderate): Secondary | ICD-10-CM | POA: Diagnosis not present

## 2018-08-14 DIAGNOSIS — I5022 Chronic systolic (congestive) heart failure: Secondary | ICD-10-CM | POA: Insufficient documentation

## 2018-08-14 DIAGNOSIS — I2510 Atherosclerotic heart disease of native coronary artery without angina pectoris: Secondary | ICD-10-CM | POA: Diagnosis not present

## 2018-08-14 DIAGNOSIS — I429 Cardiomyopathy, unspecified: Secondary | ICD-10-CM | POA: Diagnosis not present

## 2018-08-14 DIAGNOSIS — N400 Enlarged prostate without lower urinary tract symptoms: Secondary | ICD-10-CM | POA: Diagnosis not present

## 2018-08-14 DIAGNOSIS — E785 Hyperlipidemia, unspecified: Secondary | ICD-10-CM | POA: Diagnosis not present

## 2018-08-14 DIAGNOSIS — I480 Paroxysmal atrial fibrillation: Secondary | ICD-10-CM | POA: Diagnosis not present

## 2018-08-14 DIAGNOSIS — I130 Hypertensive heart and chronic kidney disease with heart failure and stage 1 through stage 4 chronic kidney disease, or unspecified chronic kidney disease: Secondary | ICD-10-CM | POA: Diagnosis not present

## 2018-08-14 HISTORY — PX: CARDIOVERSION: SHX1299

## 2018-08-14 SURGERY — CARDIOVERSION
Anesthesia: General

## 2018-08-14 MED ORDER — PROPOFOL 10 MG/ML IV BOLUS
INTRAVENOUS | Status: DC | PRN
  Administered 2018-08-14: 50 mg via INTRAVENOUS

## 2018-08-14 MED ORDER — PHENYLEPHRINE 40 MCG/ML (10ML) SYRINGE FOR IV PUSH (FOR BLOOD PRESSURE SUPPORT)
PREFILLED_SYRINGE | INTRAVENOUS | Status: DC | PRN
  Administered 2018-08-14: 120 ug via INTRAVENOUS

## 2018-08-14 MED ORDER — LIDOCAINE 2% (20 MG/ML) 5 ML SYRINGE
INTRAMUSCULAR | Status: DC | PRN
  Administered 2018-08-14: 60 mg via INTRAVENOUS

## 2018-08-14 MED ORDER — SODIUM CHLORIDE 0.9 % IV SOLN
INTRAVENOUS | Status: AC | PRN
  Administered 2018-08-14: 500 mL via INTRAVENOUS

## 2018-08-14 NOTE — Anesthesia Procedure Notes (Signed)
Date/Time: 08/14/2018 10:51 AM Performed by: Trinna Post., CRNA Pre-anesthesia Checklist: Patient identified, Emergency Drugs available, Suction available, Patient being monitored and Timeout performed Patient Re-evaluated:Patient Re-evaluated prior to induction Oxygen Delivery Method: Ambu bag Preoxygenation: Pre-oxygenation with 100% oxygen Induction Type: IV induction Placement Confirmation: positive ETCO2

## 2018-08-14 NOTE — Interval H&P Note (Signed)
History and Physical Interval Note:  08/14/2018 10:49 AM  Stephen Maldonado  has presented today for surgery, with the diagnosis of A-FIB.  The various methods of treatment have been discussed with the patient and family. After consideration of risks, benefits and other options for treatment, the patient has consented to  Procedure(s): CARDIOVERSION (N/A) as a surgical intervention.  The patient's history has been reviewed, patient examined, no change in status, stable for surgery.  I have reviewed the patient's chart and labs.  Questions were answered to the patient's satisfaction.     [place]

## 2018-08-14 NOTE — Anesthesia Preprocedure Evaluation (Signed)
Anesthesia Evaluation  Patient identified by MRN, date of birth, ID band Patient awake    Reviewed: Allergy & Precautions, NPO status , Patient's Chart, lab work & pertinent test results, reviewed documented beta blocker date and time   Airway Mallampati: III  TM Distance: >3 FB Neck ROM: Full    Dental no notable dental hx.    Pulmonary neg pulmonary ROS,    Pulmonary exam normal breath sounds clear to auscultation       Cardiovascular hypertension, Pt. on home beta blockers and Pt. on medications +CHF and + DVT  Normal cardiovascular exam Rhythm:Regular Rate:Normal  LHC 2020 LM: Normal LAD: No significant disease LCx: Mid LCx focal 60% stenosis. dFR 0.97 (Non-significant) RCA: Prox 30%, mid 50% focal stenoses. LVEDP mildly elevated. Impression: Nonobstructive CAD Nonischemic cardiomyopathy  EKG 07/01/2018: Afib with controlled ventricular rate  Echocardiogram 07/06/2018: Severely depressed LV systolic function with EF 20%. Left ventricle cavity is normal in size. Moderate concentric hypertrophy of the left ventricle. Severe global hypokinesis. Unable to evaluate diastolic function due to atrial fibrillation. Calculated EF 20%. Left atrial cavity is severely dilated at 5.4 cm. Mild aortic stenosis. Aortic valve mean gradient of 9 mmHg, Vmax of 2.0  m/s. Calculated aortic valve area by continuity equation is 1.7 cm.   Mild (Grade I) mitral regurgitation. Mild tricuspid regurgitation. Estimated pulmonary artery systolic pressure 20 mmHg    Neuro/Psych negative neurological ROS  negative psych ROS   GI/Hepatic Neg liver ROS, GERD  ,  Endo/Other  Morbid obesity  Renal/GU Renal InsufficiencyRenal disease  negative genitourinary   Musculoskeletal  (+) Arthritis ,   Abdominal   Peds  Hematology negative hematology ROS (+)   Anesthesia Other Findings   Reproductive/Obstetrics                              Anesthesia Physical Anesthesia Plan  ASA: IV  Anesthesia Plan: General   Post-op Pain Management:    Induction: Intravenous  PONV Risk Score and Plan: 2 and Propofol infusion and Treatment may vary due to age or medical condition  Airway Management Planned: Natural Airway and Mask  Additional Equipment:   Intra-op Plan:   Post-operative Plan:   Informed Consent: I have reviewed the patients History and Physical, chart, labs and discussed the procedure including the risks, benefits and alternatives for the proposed anesthesia with the patient or authorized representative who has indicated his/her understanding and acceptance.     Dental advisory given  Plan Discussed with: CRNA  Anesthesia Plan Comments:         Anesthesia Quick Evaluation

## 2018-08-14 NOTE — Transfer of Care (Signed)
Immediate Anesthesia Transfer of Care Note  Patient: Stephen Maldonado  Procedure(s) Performed: CARDIOVERSION (N/A )  Patient Location: PACU and Endoscopy Unit  Anesthesia Type:General  Level of Consciousness: awake, alert  and oriented  Airway & Oxygen Therapy: Patient Spontanous Breathing  Post-op Assessment: Report given to RN and Post -op Vital signs reviewed and stable  Post vital signs: Reviewed and stable  Last Vitals:  Vitals Value Taken Time  BP    Temp    Pulse    Resp    SpO2      Last Pain:  Vitals:   08/14/18 1009  TempSrc: Oral  PainSc: 0-No pain         Complications: No apparent anesthesia complications

## 2018-08-14 NOTE — Anesthesia Postprocedure Evaluation (Signed)
Anesthesia Post Note  Patient: Stephen Maldonado  Procedure(s) Performed: CARDIOVERSION (N/A )     Patient location during evaluation: Endoscopy Anesthesia Type: General Level of consciousness: awake and alert Pain management: pain level controlled Vital Signs Assessment: post-procedure vital signs reviewed and stable Respiratory status: spontaneous breathing, nonlabored ventilation, respiratory function stable and patient connected to nasal cannula oxygen Cardiovascular status: blood pressure returned to baseline and stable Postop Assessment: no apparent nausea or vomiting Anesthetic complications: no    Last Vitals:  Vitals:   08/14/18 1109 08/14/18 1119  BP: 113/74 119/69  Pulse: 88 89  Resp: (!) 22 20  Temp: 37 C   SpO2: 94% 95%    Last Pain:  Vitals:   08/14/18 1119  TempSrc:   PainSc: 0-No pain                 Jensen Cheramie L Aleksander Edmiston

## 2018-08-14 NOTE — CV Procedure (Signed)
Direct current cardioversion: ° °Indication symptomatic: Symptomatic atrial fibrillation ° °Procedure: Under deep sedation administered and monitored by anesthesiology, synchronized direct current cardioversion performed. Patient was delivered with 120, 150 Joules of electricity X 2 with success to NSR. Patient tolerated the procedure well. No immediate complication noted.  ° °Nari Vannatter J Kijana Estock, MD °[facility] °Pager: [phone] °Office: [phone] °If no answer Cell [phone] °  ° °

## 2018-08-14 NOTE — Discharge Instructions (Signed)

## 2018-08-16 ENCOUNTER — Other Ambulatory Visit: Payer: Self-pay | Admitting: Cardiology

## 2018-08-17 ENCOUNTER — Encounter (HOSPITAL_COMMUNITY): Payer: Self-pay | Admitting: Cardiology

## 2018-08-24 ENCOUNTER — Ambulatory Visit (INDEPENDENT_AMBULATORY_CARE_PROVIDER_SITE_OTHER): Payer: BC Managed Care – PPO | Admitting: Cardiology

## 2018-08-24 ENCOUNTER — Encounter: Payer: Self-pay | Admitting: Cardiology

## 2018-08-24 ENCOUNTER — Ambulatory Visit: Payer: BC Managed Care – PPO | Admitting: Orthopaedic Surgery

## 2018-08-24 ENCOUNTER — Other Ambulatory Visit: Payer: Self-pay

## 2018-08-24 VITALS — BP 131/73 | HR 88 | Ht 71.0 in | Wt 320.0 lb

## 2018-08-24 DIAGNOSIS — I10 Essential (primary) hypertension: Secondary | ICD-10-CM

## 2018-08-24 DIAGNOSIS — I4811 Longstanding persistent atrial fibrillation: Secondary | ICD-10-CM | POA: Diagnosis not present

## 2018-08-24 DIAGNOSIS — I5022 Chronic systolic (congestive) heart failure: Secondary | ICD-10-CM

## 2018-08-24 NOTE — Progress Notes (Signed)
Follow up visit  Subjective:   Stephen Maldonado, male    DOB: 06/15/1955, 63 y.o.   MRN: [id number]   Chief Complaint  Patient presents with  . Atrial Fibrillation    f/u cardioversion    63 y.o. Caucasian male with hypertension, hyperlipidemia, nonischemic cardiomyopathy, persistent atrial fibrillation, morbid obesity, referred for management of atrial fibrillation, heart failure.  Patient underwent successful cardioversion on 08/14/2018. He is maintaining sinus rhythm. His leg swelling and weight have improved. He still has shortness of breath, but states that it is improved as well.   Past Medical History:  Diagnosis Date  . Arthritis    knees  . Bladder spasms   . BPH (benign prostatic hyperplasia)   . DVT (deep venous thrombosis) ([place]) ocyt 2016   left leg tx medically  . GERD (gastroesophageal reflux disease)   . History of colon polyps    hyperplastic 06-14-2010  . Hydronephrosis   . Hypertension   . Obesity, morbid ([place])   . Urinary retention      Past Surgical History:  Procedure Laterality Date  . CARDIOVERSION N/A 08/14/2018   Procedure: CARDIOVERSION;  Surgeon: Nigel Mormon, MD;  Location: [facility];  Service: Cardiovascular;  Laterality: N/A;  . COLONOSCOPY  06/14/2010  . CORONARY ANGIOGRAPHY N/A 07/11/2016   Procedure: Coronary Angiography;  Surgeon: Dixie Dials, MD;  Location: [facility];  Service: Cardiovascular;  Laterality: N/A;  . CYSTOSCOPY W/ RETROGRADES Bilateral 10/03/2015   Procedure: CYSTOSCOPY WITH RETROGRADE PYELOGRAM;  Surgeon: [name], MD;  Location: [facility];  Service: Urology;  Laterality: Bilateral;  . GREEN LIGHT LASER TURP (TRANSURETHRAL RESECTION OF PROSTATE N/A 10/03/2015   Procedure: GREEN LIGHT LASER,  TURP - STAGED(TRANSURETHRAL RESECTION OF PROSTATE;  Surgeon: [name], MD;  Location[facility];  Service: Urology;  Laterality: N/A;  . INTRAVASCULAR PRESSURE  WIRE/FFR STUDY N/A 07/14/2018   Procedure: INTRAVASCULAR PRESSURE WIRE/FFR STUDY;  Surgeon: Nigel Mormon, MD;  Location: [place] [facility];  Service: Cardiovascular;  Laterality: N/A;  . KNEE ARTHROSCOPY     not sure which knee  . LEFT HEART CATH AND CORONARY ANGIOGRAPHY N/A 07/14/2018   Procedure: LEFT HEART CATH AND CORONARY ANGIOGRAPHY;  Surgeon: Nigel Mormon, MD;  Location: [facility];  Service: Cardiovascular;  Laterality: N/A;  . QUADRICEPS TENDON REPAIR Bilateral left 02/24/2003/   right 2006  . TOTAL KNEE ARTHROPLASTY Right 12/01/2015   Procedure: RIGHT TOTAL KNEE ARTHROPLASTY;  Surgeon: [name], MD;  Location: [facility];  Service: Orthopedics;  Laterality: Right;  . TOTAL KNEE ARTHROPLASTY Left 01/10/2017   Procedure: LEFT TOTAL KNEE ARTHROPLASTY;  Surgeon: [name], MD;  Location: [facility];  Service: Orthopedics;  Laterality: Left;  . TOTAL SHOULDER ARTHROPLASTY Right 01/23/2018   Procedure: RIGHT REVERSE TOTAL SHOULDER ARTHROPLASTY;  Surgeon: Meredith Pel, MD;  Location: [place];  Service: Orthopedics;  Laterality: Right;     Social History   Socioeconomic History  . Marital status: Married    Spouse name: Not on file  . Number of children: 1  . Years of education: Not on file  . Highest education level: Not on file  Occupational History  . Not on file  Social Needs  . Financial resource strain: Not on file  . Food insecurity    Worry: Not on file    Inability: Not on file  . Transportation needs    Medical: Not on file  Non-medical: Not on file  Tobacco Use  . Smoking status: Never Smoker  . Smokeless tobacco: Former Systems developer    Types: Chew  Substance and Sexual Activity  . Alcohol use: Yes    Comment: 1-2 beers days  . Drug use: No  . Sexual activity: Not Currently  Lifestyle  . Physical activity    Days per week: Not on file    Minutes per session: Not on file  . Stress: Not on file  Relationships  . Social  Herbalist on phone: Not on file    Gets together: Not on file    Attends religious service: Not on file    Active member of club or organization: Not on file    Attends meetings of clubs or organizations: Not on file    Relationship status: Not on file  . Intimate partner violence    Fear of current or ex partner: Not on file    Emotionally abused: Not on file    Physically abused: Not on file    Forced sexual activity: Not on file  Other Topics Concern  . Not on file  Social History Narrative  . Not on file     Family History  Problem Relation Age of Onset  . CAD Mother   . Lung cancer Mother   . Breast cancer Mother   . Diabetes Mother   . Heart failure Father   . CAD Father   . Diabetes Father      Current Outpatient Medications on File Prior to Visit  Medication Sig Dispense Refill  . apixaban (ELIQUIS) 5 MG TABS tablet Take 1 tablet (5 mg total) by mouth 2 (two) times daily. 60 tablet 3  . carvedilol (COREG) 6.25 MG tablet Take 0.5 tablets (3.125 mg total) by mouth 2 (two) times daily with a meal. 90 tablet 2  . Cholecalciferol (VITAMIN D) 50 MCG (2000 UT) tablet Take 2,000 Units by mouth daily.    . Coenzyme Q10 (COQ-10) 200 MG CAPS Take 200 mg by mouth daily.    . cyclobenzaprine (FLEXERIL) 10 MG tablet Take 10 mg by mouth daily as needed for muscle spasms.     . furosemide (LASIX) 80 MG tablet Take 1 tablet (80 mg total) by mouth 2 (two) times daily. 90 tablet 2  . Garlic 8032 MG CAPS Take 1,000 mg by mouth daily.    . hydrALAZINE (APRESOLINE) 25 MG tablet Take 25 mg by mouth 2 (two) times daily with a meal.  3  . Ketotifen Fumarate (ALLERGY EYE DROPS OP) Place 1 drop into both eyes daily as needed (allergies).    . losartan (COZAAR) 25 MG tablet Take 1 tablet (25 mg total) by mouth daily. 30 tablet 1  . naphazoline-pheniramine (ALLERGY EYE) 0.025-0.3 % ophthalmic solution Place 1 drop into both eyes 4 (four) times daily as needed for eye irritation.     . Omega-3 Fatty Acids (FISH OIL) 1000 MG CAPS Take 1,000 mg by mouth daily.     Marland Kitchen oxyCODONE (OXY IR/ROXICODONE) 5 MG immediate release tablet Take 2 tablets (10 mg total) by mouth every 4 (four) hours as needed for moderate pain (pain score 4-6). (Patient taking differently: Take 5 mg by mouth every 4 (four) hours as needed for moderate pain (pain score 4-6). ) 42 tablet 0  . Potassium Chloride ER 20 MEQ TBCR Take 20 mEq by mouth daily. with food    . tamsulosin (FLOMAX) 0.4 MG CAPS capsule Take  0.4 mg by mouth every morning.    Marland Kitchen tiZANidine (ZANAFLEX) 4 MG tablet TAKE 1 TABLET (4 MG TOTAL) BY MOUTH EVERY 8 (EIGHT) HOURS AS NEEDED FOR MUSCLE SPASMS. 60 tablet 0  . Turmeric 500 MG TABS Take 500 mg by mouth daily.    . vitamin B-12 (CYANOCOBALAMIN) 1000 MCG tablet Take 1,000 mcg by mouth daily.    . vitamin C (ASCORBIC ACID) 500 MG tablet Take 500 mg by mouth daily.      No current facility-administered medications on file prior to visit.     Cardiovascular studies:  EKG 08/24/2018: Sinus rhythm 86 bpm.  Left atrial enlargement.  EKG 08/10/2018: Atrial fibrillation with RVR. No significant change compared to previous EKG.   Left/right heart cath 07/14/2018: LM: Normal LAD: No significant disease LCx: Mid LCx focal 60% stenosis. dFR 0.97 (Non-significant) RCA: Prox 30%, mid 50% focal stenoses. LVEDP mildly elevated.  Impression: Nonobstructive CAD Nonischemic cardiomyopathy  EKG 07/01/2018: Afib with controlled ventricular rate  Echocardiogram 07/06/2018: Severely depressed LV systolic function with EF 20%. Left ventricle cavity is normal in size. Moderate concentric hypertrophy of the left ventricle. Severe global hypokinesis. Unable to evaluate diastolic function due to atrial fibrillation. Calculated EF 20%. Left atrial cavity is severely dilated at 5.4 cm. Mild aortic stenosis. Aortic valve mean gradient of 9 mmHg, Vmax of 2.0  m/s. Calculated aortic valve area by continuity  equation is 1.7 cm.   Mild (Grade I) mitral regurgitation. Mild tricuspid regurgitation. Estimated pulmonary artery systolic pressure 20 mmHg.  Coronary angiogram 07/11/2016: Prox-mid RCA 30% stenosis. Nonobstructive CAD  Echocardiogram 07/10/2016: - Mildly dilated LV with mild LV hypertrophy. EF 50%, mild diffuse   hypokinesis. Mildly dilated RV with normal systolic function.   Mild aortic stenosis.  Recent labs: Results for TAKUMA, CIFELLI (MRN [id number]) as of 08/10/2018 11:01  Ref. Range 07/27/2018 12:01 08/06/2018 21:19  BASIC METABOLIC PANEL Unknown Rpt (A) Rpt (A)  Sodium Latest Ref Range: 134 - 144 mmol/L 141 142  Potassium Latest Ref Range: 3.5 - 5.2 mmol/L 4.6 4.8  Chloride Latest Ref Range: 96 - 106 mmol/L 99 98  CO2 Latest Ref Range: 20 - 29 mmol/L 28 27  Glucose Latest Ref Range: 65 - 99 mg/dL 88 80  BUN Latest Ref Range: 8 - 27 mg/dL 31 (H) 29 (H)  Creatinine Latest Ref Range: 0.76 - 1.27 mg/dL 2.01 (H) 1.98 (H)  Calcium Latest Ref Range: 8.6 - 10.2 mg/dL 9.1 9.2  BUN/Creatinine Ratio Latest Ref Range: 10 - 24  15 15   GFR, Est Non African American Latest Ref Range: >59 mL/min/1.73 34 (L) 35 (L)  GFR, Est African American Latest Ref Range: >59 mL/min/1.73 40 (L) 40 (L)     07/16/2018: Glucose 10. BUN/Cr 25/1.76, eGFR 40. Na/K 143/4.2  07/01/2018:  Glucose 87. BUN/Cr 24/1.9. eGFR 36. CMP otherwise normal.  H/H 14/44. Platelets 163. BNP 164. Chol 161, TG 230, HDL 47, non-HDL cholesterol 114.   Review of Systems  Constitution: Negative for decreased appetite, malaise/fatigue, weight gain and weight loss.  HENT: Negative for congestion.   Eyes: Negative for visual disturbance.  Cardiovascular: Positive for dyspnea on exertion and leg swelling. Negative for chest pain, palpitations and syncope.  Respiratory: Positive for shortness of breath. Negative for cough.   Endocrine: Negative for cold intolerance.  Hematologic/Lymphatic: Does not bruise/bleed easily.  Skin:  Negative for itching and rash.  Musculoskeletal: Negative for myalgias.  Gastrointestinal: Negative for abdominal pain, nausea and vomiting.  Genitourinary: Negative  for dysuria.  Neurological: Negative for dizziness and weakness.  Psychiatric/Behavioral: The patient is not nervous/anxious.   All other systems reviewed and are negative.        Vitals:   08/24/18 1437  BP: 131/73  Pulse: 88  SpO2: 94%    Body mass index is 44.63 kg/m. Filed Weights   08/24/18 1437  Weight: (!) 320 lb (145.2 kg)     Objective:   Physical Exam  Constitutional: He is oriented to person, place, and time. He appears well-developed and well-nourished. No distress.  HENT:  Head: Normocephalic and atraumatic.  Eyes: Pupils are equal, round, and reactive to light. Conjunctivae are normal.  Neck: No JVD present.  Cardiovascular: Normal rate, regular rhythm and intact distal pulses.  Murmur heard.  Holosystolic murmur is present with a grade of 2/6. Pulmonary/Chest: Effort normal and breath sounds normal. He has no wheezes. He has no rales.  Abdominal: Soft. Bowel sounds are normal. There is no rebound.  Musculoskeletal:        General: Edema (1+ b/l) present.  Lymphadenopathy:    He has no cervical adenopathy.  Neurological: He is alert and oriented to person, place, and time. No cranial nerve deficit.  Skin: Skin is warm and dry.  Psychiatric: He has a normal mood and affect.  Nursing note and vitals reviewed.         Assessment & Recommendations:   63 y.o. Caucasian male with hypertension, hyperlipidemia, morbid obesity, persistent atrial fibrillation, heart failure.with reduced ejection fraction.  1. Chronic systolic heart failure ([place]): NYHA class II-III symptoms. Nonischemic cardiomyopathy Etiology multifactorial, including Afib, obesity, OSA. Real function stable with CKD stage 3.  Continue lasix to 80 mg bid, losartan to 25 mg daily, carvedilol to 6.25 mg bid, hydralazine 25  mg bid.   2. Persistent Afib: Now maintaining sinus rhythm s/p cardioversion 08/14/2018. CHA2DS2VASc score 3, annual stroke risk 3% Continue eliquis 5 mg bid.   3. Essential hypertension Better controlled.   4. Morbid obesity ([place]) Diet and lifestyle modification, salt and calorie restriction diet.  5. Suspected OSA: Sleep study pending.   Follow up in 4 weeks.   Nigel Mormon, MD [facility] Pager: [phone] Office: [phone] If no answer Cell [phone]

## 2018-08-25 ENCOUNTER — Encounter: Payer: Self-pay | Admitting: Cardiology

## 2018-08-31 ENCOUNTER — Ambulatory Visit: Payer: BC Managed Care – PPO | Admitting: Orthopaedic Surgery

## 2018-09-24 ENCOUNTER — Encounter: Payer: Self-pay | Admitting: Cardiology

## 2018-09-24 ENCOUNTER — Ambulatory Visit (INDEPENDENT_AMBULATORY_CARE_PROVIDER_SITE_OTHER): Payer: BC Managed Care – PPO | Admitting: Cardiology

## 2018-09-24 ENCOUNTER — Other Ambulatory Visit: Payer: Self-pay

## 2018-09-24 VITALS — BP 138/70 | HR 82 | Ht 71.0 in | Wt 324.0 lb

## 2018-09-24 DIAGNOSIS — I480 Paroxysmal atrial fibrillation: Secondary | ICD-10-CM | POA: Diagnosis not present

## 2018-09-24 DIAGNOSIS — I5022 Chronic systolic (congestive) heart failure: Secondary | ICD-10-CM | POA: Diagnosis not present

## 2018-09-24 DIAGNOSIS — I10 Essential (primary) hypertension: Secondary | ICD-10-CM | POA: Diagnosis not present

## 2018-09-24 MED ORDER — CARVEDILOL 6.25 MG PO TABS: 6.2500 mg | ORAL_TABLET | Freq: Two times a day (BID) | ORAL | 2 refills | Status: DC

## 2018-09-24 NOTE — Progress Notes (Signed)
 Follow up visit  Subjective:   Stephen Maldonado, male    DOB: 09/20/1955, 63 y.o.   MRN: [id number]   Chief Complaint  Patient presents with  . Atrial Fibrillation  . Follow-up    63 y.o. Caucasian male with hypertension, hyperlipidemia, nonischemic cardiomyopathy, persistent atrial fibrillation, morbid obesity, referred for management of atrial fibrillation, heart failure.  He has had significant improvement in his leg edema and shortness of breath. His biggest complaint at this time is his back pain.   He is currently taking carvedilol 3.125 mg bid.   Past Medical History:  Diagnosis Date  . Arthritis    knees  . Bladder spasms   . BPH (benign prostatic hyperplasia)   . DVT (deep venous thrombosis) ([facility]) ocyt 2016   left leg tx medically  . GERD (gastroesophageal reflux disease)   . History of colon polyps    hyperplastic 06-14-2010  . Hydronephrosis   . Hypertension   . Obesity, morbid ([facility])   . Urinary retention      Past Surgical History:  Procedure Laterality Date  . CARDIOVERSION N/A 08/14/2018   Procedure: CARDIOVERSION;  Surgeon: Patwardhan, Manish J, MD;  Location: [facility];  Service: Cardiovascular;  Laterality: N/A;  . COLONOSCOPY  06/14/2010  . CORONARY ANGIOGRAPHY N/A 07/11/2016   Procedure: Coronary Angiography;  Surgeon: Kadakia, Ajay, MD;  Location: [facility];  Service: Cardiovascular;  Laterality: N/A;  . CYSTOSCOPY W/ RETROGRADES Bilateral 10/03/2015   Procedure: CYSTOSCOPY WITH RETROGRADE PYELOGRAM;  Surgeon: Matthew Eskridge, MD;  Location: [facility];  Service: Urology;  Laterality: Bilateral;  . GREEN LIGHT LASER TURP (TRANSURETHRAL RESECTION OF PROSTATE N/A 10/03/2015   Procedure: GREEN LIGHT LASER,  TURP - STAGED(TRANSURETHRAL RESECTION OF PROSTATE;  Surgeon: Matthew Eskridge, MD;  Location: [facility];  Service: Urology;  Laterality: N/A;  . INTRAVASCULAR PRESSURE WIRE/FFR STUDY N/A 07/14/2018    Procedure: INTRAVASCULAR PRESSURE WIRE/FFR STUDY;  Surgeon: Patwardhan, Manish J, MD;  [facility];  Service: Cardiovascular;  Laterality: N/A;  . KNEE ARTHROSCOPY     not sure which knee  . LEFT HEART CATH AND CORONARY ANGIOGRAPHY N/A 07/14/2018   Procedure: LEFT HEART CATH AND CORONARY ANGIOGRAPHY;  Surgeon: Patwardhan, Manish J, MD;  [facility];  Service: Cardiovascular;  Laterality: N/A;  . QUADRICEPS TENDON REPAIR Bilateral left 02/24/2003/   right 2006  . TOTAL KNEE ARTHROPLASTY Right 12/01/2015   Procedure: RIGHT TOTAL KNEE ARTHROPLASTY;  Surgeon: Christopher Y Blackman, MD;  Location: [facility];  Service: Orthopedics;  Laterality: Right;  . TOTAL KNEE ARTHROPLASTY Left 01/10/2017   Procedure: LEFT TOTAL KNEE ARTHROPLASTY;  Surgeon: Blackman, Christopher Y, MD;  Location: [facility];  Service: Orthopedics;  Laterality: Left;  . TOTAL SHOULDER ARTHROPLASTY Right 01/23/2018   Procedure: RIGHT REVERSE TOTAL SHOULDER ARTHROPLASTY;  Surgeon: Dean, Gregory Scott, MD;  Location: [facility];  Service: Orthopedics;  Laterality: Right;     Social History   Socioeconomic History  . Marital status: Married    Spouse name: Not on file  . Number of children: 1  . Years of education: Not on file  . Highest education level: Not on file  Occupational History  . Not on file  Social Needs  . Financial resource strain: Not on file  . Food insecurity    Worry: Not on file    Inability: Not on file  . Transportation needs    Medical: Not on file      Non-medical: Not on file  Tobacco Use  . Smoking status: Never Smoker  . Smokeless tobacco: Former Systems developer    Types: Chew  Substance and Sexual Activity  . Alcohol use: Yes    Comment: 1-2 beers days  . Drug use: No  . Sexual activity: Not Currently  Lifestyle  . Physical activity    Days per week: Not on file    Minutes per session: Not on file  . Stress: Not on file  Relationships  . Social Herbalist on  phone: Not on file    Gets together: Not on file    Attends religious service: Not on file    Active member of club or organization: Not on file    Attends meetings of clubs or organizations: Not on file    Relationship status: Not on file  . Intimate partner violence    Fear of current or ex partner: Not on file    Emotionally abused: Not on file    Physically abused: Not on file    Forced sexual activity: Not on file  Other Topics Concern  . Not on file  Social History Narrative  . Not on file     Family History  Problem Relation Age of Onset  . CAD Mother   . Lung cancer Mother   . Breast cancer Mother   . Diabetes Mother   . Heart failure Father   . CAD Father   . Diabetes Father      Current Outpatient Medications on File Prior to Visit  Medication Sig Dispense Refill  . apixaban (ELIQUIS) 5 MG TABS tablet Take 1 tablet (5 mg total) by mouth 2 (two) times daily. 60 tablet 3  . carvedilol (COREG) 6.25 MG tablet Take 0.5 tablets (3.125 mg total) by mouth 2 (two) times daily with a meal. 90 tablet 2  . Cholecalciferol (VITAMIN D) 50 MCG (2000 UT) tablet Take 2,000 Units by mouth daily.    . Coenzyme Q10 (COQ-10) 200 MG CAPS Take 200 mg by mouth daily.    . cyclobenzaprine (FLEXERIL) 10 MG tablet Take 10 mg by mouth daily as needed for muscle spasms.     . furosemide (LASIX) 80 MG tablet Take 1 tablet (80 mg total) by mouth 2 (two) times daily. 90 tablet 2  . Garlic 9163 MG CAPS Take 1,000 mg by mouth daily.    . hydrALAZINE (APRESOLINE) 25 MG tablet Take 25 mg by mouth 2 (two) times daily with a meal.  3  . Ketotifen Fumarate (ALLERGY EYE DROPS OP) Place 1 drop into both eyes daily as needed (allergies).    . losartan (COZAAR) 25 MG tablet Take 1 tablet (25 mg total) by mouth daily. 30 tablet 1  . naphazoline-pheniramine (ALLERGY EYE) 0.025-0.3 % ophthalmic solution Place 1 drop into both eyes 4 (four) times daily as needed for eye irritation.    . Omega-3 Fatty Acids  (FISH OIL) 1000 MG CAPS Take 1,000 mg by mouth daily.     Marland Kitchen oxyCODONE (OXY IR/ROXICODONE) 5 MG immediate release tablet Take 2 tablets (10 mg total) by mouth every 4 (four) hours as needed for moderate pain (pain score 4-6). (Patient taking differently: Take 5 mg by mouth every 4 (four) hours as needed for moderate pain (pain score 4-6). ) 42 tablet 0  . Potassium Chloride ER 20 MEQ TBCR Take 20 mEq by mouth daily. with food    . tamsulosin (FLOMAX) 0.4 MG CAPS capsule Take  0.4 mg by mouth every morning.    . tiZANidine (ZANAFLEX) 4 MG tablet TAKE 1 TABLET (4 MG TOTAL) BY MOUTH EVERY 8 (EIGHT) HOURS AS NEEDED FOR MUSCLE SPASMS. 60 tablet 0  . Turmeric 500 MG TABS Take 500 mg by mouth daily.    . vitamin B-12 (CYANOCOBALAMIN) 1000 MCG tablet Take 1,000 mcg by mouth daily.    . vitamin C (ASCORBIC ACID) 500 MG tablet Take 500 mg by mouth daily.      No current facility-administered medications on file prior to visit.     Cardiovascular studies:  EKG 08/24/2018: Sinus rhythm 86 bpm.  Left atrial enlargement.  EKG 08/10/2018: Atrial fibrillation with RVR. No significant change compared to previous EKG.   Left/right heart cath 07/14/2018: LM: Normal LAD: No significant disease LCx: Mid LCx focal 60% stenosis. dFR 0.97 (Non-significant) RCA: Prox 30%, mid 50% focal stenoses. LVEDP mildly elevated.  Impression: Nonobstructive CAD Nonischemic cardiomyopathy  EKG 07/01/2018: Afib with controlled ventricular rate  Echocardiogram 07/06/2018: Severely depressed LV systolic function with EF 20%. Left ventricle cavity is normal in size. Moderate concentric hypertrophy of the left ventricle. Severe global hypokinesis. Unable to evaluate diastolic function due to atrial fibrillation. Calculated EF 20%. Left atrial cavity is severely dilated at 5.4 cm. Mild aortic stenosis. Aortic valve mean gradient of 9 mmHg, Vmax of 2.0  m/s. Calculated aortic valve area by continuity equation is 1.7 cm.    Mild (Grade I) mitral regurgitation. Mild tricuspid regurgitation. Estimated pulmonary artery systolic pressure 20 mmHg.  Coronary angiogram 07/11/2016: Prox-mid RCA 30% stenosis. Nonobstructive CAD  Echocardiogram 07/10/2016: - Mildly dilated LV with mild LV hypertrophy. EF 50%, mild diffuse   hypokinesis. Mildly dilated RV with normal systolic function.   Mild aortic stenosis.  Recent labs: Results for Hershberger, Hershey A (MRN [id number]) as of 08/10/2018 11:01  Ref. Range 07/27/2018 12:01 08/06/2018 12:27  BASIC METABOLIC PANEL Unknown Rpt (A) Rpt (A)  Sodium Latest Ref Range: 134 - 144 mmol/L 141 142  Potassium Latest Ref Range: 3.5 - 5.2 mmol/L 4.6 4.8  Chloride Latest Ref Range: 96 - 106 mmol/L 99 98  CO2 Latest Ref Range: 20 - 29 mmol/L 28 27  Glucose Latest Ref Range: 65 - 99 mg/dL 88 80  BUN Latest Ref Range: 8 - 27 mg/dL 31 (H) 29 (H)  Creatinine Latest Ref Range: 0.76 - 1.27 mg/dL 2.01 (H) 1.98 (H)  Calcium Latest Ref Range: 8.6 - 10.2 mg/dL 9.1 9.2  BUN/Creatinine Ratio Latest Ref Range: 10 - 24  15 15  GFR, Est Non African American Latest Ref Range: >59 mL/min/1.73 34 (L) 35 (L)  GFR, Est African American Latest Ref Range: >59 mL/min/1.73 40 (L) 40 (L)     07/16/2018: Glucose 10. BUN/Cr 25/1.76, eGFR 40. Na/K 143/4.2  07/01/2018:  Glucose 87. BUN/Cr 24/1.9. eGFR 36. CMP otherwise normal.  H/H 14/44. Platelets 163. BNP 164. Chol 161, TG 230, HDL 47, non-HDL cholesterol 114.   Review of Systems  Constitution: Negative for decreased appetite, malaise/fatigue, weight gain and weight loss.  HENT: Negative for congestion.   Eyes: Negative for visual disturbance.  Cardiovascular: Positive for dyspnea on exertion and leg swelling. Negative for chest pain, palpitations and syncope.  Respiratory: Positive for shortness of breath. Negative for cough.   Endocrine: Negative for cold intolerance.  Hematologic/Lymphatic: Does not bruise/bleed easily.  Skin: Negative for itching  and rash.  Musculoskeletal: Negative for myalgias.  Gastrointestinal: Negative for abdominal pain, nausea and vomiting.  Genitourinary: Negative   for dysuria.  Neurological: Negative for dizziness and weakness.  Psychiatric/Behavioral: The patient is not nervous/anxious.   All other systems reviewed and are negative.        Vitals:   09/24/18 1400  BP: 138/70  Pulse: 82  SpO2: 94%    Body mass index is 45.19 kg/m. Filed Weights   09/24/18 1400  Weight: (!) 147 kg     Objective:   Physical Exam  Constitutional: He is oriented to person, place, and time. He appears well-developed and well-nourished. No distress.  HENT:  Head: Normocephalic and atraumatic.  Eyes: Pupils are equal, round, and reactive to light. Conjunctivae are normal.  Neck: No JVD present.  Cardiovascular: Normal rate, regular rhythm and intact distal pulses.  Murmur heard.  Holosystolic murmur is present with a grade of 2/6. Pulmonary/Chest: Effort normal and breath sounds normal. He has no wheezes. He has no rales.  Abdominal: Soft. Bowel sounds are normal. There is no rebound.  Musculoskeletal:        General: Edema (Trace b/l) present.  Lymphadenopathy:    He has no cervical adenopathy.  Neurological: He is alert and oriented to person, place, and time. No cranial nerve deficit.  Skin: Skin is warm and dry.  Psychiatric: He has a normal mood and affect.  Nursing note and vitals reviewed.       Assessment & Recommendations:   63 y.o. Caucasian male with hypertension, hyperlipidemia, morbid obesity, persistent atrial fibrillation, heart failure.with reduced ejection fraction.  1. Chronic systolic heart failure ([facility]): NYHA class II-III symptoms. Nonischemic cardiomyopathy Etiology multifactorial, including Afib, obesity, OSA. Real function stable with CKD stage 3.  Continue lasix to 80 mg bid, losartan  25 mg daily, carvedilol at recommended dose 6.25 mg bid, hydralazine 25 mg bid.  Unable to  use spironolactone due to CKD.   2. Afib: Now maintaining sinus rhythm s/p cardioversion 08/14/2018. CHA2DS2VASc score 3, annual stroke risk 3% Continue eliquis 5 mg bid.   3. Essential hypertension Better controlled.   4. Morbid obesity ([facility]) Diet and lifestyle modification, salt and calorie restriction diet.  Follow up in 3 months.   Manish J Patwardhan, MD [facility] Pager: [phone] Office: [phone] If no answer Cell [phone]   

## 2018-09-28 ENCOUNTER — Other Ambulatory Visit: Payer: Self-pay | Admitting: Cardiology

## 2018-09-28 DIAGNOSIS — S336XXA Sprain of sacroiliac joint, initial encounter: Secondary | ICD-10-CM | POA: Diagnosis not present

## 2018-09-28 DIAGNOSIS — I5023 Acute on chronic systolic (congestive) heart failure: Secondary | ICD-10-CM

## 2018-09-28 DIAGNOSIS — I5022 Chronic systolic (congestive) heart failure: Secondary | ICD-10-CM

## 2018-09-30 DIAGNOSIS — S336XXA Sprain of sacroiliac joint, initial encounter: Secondary | ICD-10-CM | POA: Diagnosis not present

## 2018-10-05 DIAGNOSIS — S336XXA Sprain of sacroiliac joint, initial encounter: Secondary | ICD-10-CM | POA: Diagnosis not present

## 2018-10-08 DIAGNOSIS — S336XXA Sprain of sacroiliac joint, initial encounter: Secondary | ICD-10-CM | POA: Diagnosis not present

## 2018-10-13 DIAGNOSIS — S336XXA Sprain of sacroiliac joint, initial encounter: Secondary | ICD-10-CM | POA: Diagnosis not present

## 2018-10-15 DIAGNOSIS — S336XXA Sprain of sacroiliac joint, initial encounter: Secondary | ICD-10-CM | POA: Diagnosis not present

## 2018-10-19 DIAGNOSIS — S336XXA Sprain of sacroiliac joint, initial encounter: Secondary | ICD-10-CM | POA: Diagnosis not present

## 2018-10-22 DIAGNOSIS — S336XXA Sprain of sacroiliac joint, initial encounter: Secondary | ICD-10-CM | POA: Diagnosis not present

## 2018-10-26 DIAGNOSIS — S336XXA Sprain of sacroiliac joint, initial encounter: Secondary | ICD-10-CM | POA: Diagnosis not present

## 2018-10-29 DIAGNOSIS — S336XXA Sprain of sacroiliac joint, initial encounter: Secondary | ICD-10-CM | POA: Diagnosis not present

## 2018-11-04 DIAGNOSIS — S336XXA Sprain of sacroiliac joint, initial encounter: Secondary | ICD-10-CM | POA: Diagnosis not present

## 2018-11-09 DIAGNOSIS — S336XXA Sprain of sacroiliac joint, initial encounter: Secondary | ICD-10-CM | POA: Diagnosis not present

## 2018-11-17 ENCOUNTER — Ambulatory Visit
Admission: RE | Admit: 2018-11-17 | Discharge: 2018-11-17 | Disposition: A | Discharge status: Home / Self Care | Payer: BC Managed Care – PPO | Source: Ambulatory Visit | Attending: Internal Medicine | Admitting: Internal Medicine

## 2018-11-17 ENCOUNTER — Other Ambulatory Visit: Payer: Self-pay | Admitting: Internal Medicine

## 2018-11-17 DIAGNOSIS — M545 Low back pain, unspecified: Secondary | ICD-10-CM

## 2018-11-17 DIAGNOSIS — I129 Hypertensive chronic kidney disease with stage 1 through stage 4 chronic kidney disease, or unspecified chronic kidney disease: Secondary | ICD-10-CM | POA: Diagnosis not present

## 2018-11-17 DIAGNOSIS — M25551 Pain in right hip: Secondary | ICD-10-CM | POA: Diagnosis not present

## 2018-11-29 ENCOUNTER — Other Ambulatory Visit: Payer: Self-pay | Admitting: Cardiology

## 2018-11-29 DIAGNOSIS — I4811 Longstanding persistent atrial fibrillation: Secondary | ICD-10-CM

## 2018-12-02 ENCOUNTER — Other Ambulatory Visit: Payer: Self-pay | Admitting: Internal Medicine

## 2018-12-02 DIAGNOSIS — M545 Low back pain, unspecified: Secondary | ICD-10-CM

## 2018-12-17 ENCOUNTER — Other Ambulatory Visit: Payer: BC Managed Care – PPO

## 2018-12-21 ENCOUNTER — Other Ambulatory Visit: Payer: Self-pay | Admitting: Cardiology

## 2018-12-22 ENCOUNTER — Other Ambulatory Visit: Payer: Self-pay

## 2018-12-22 ENCOUNTER — Ambulatory Visit
Admission: RE | Admit: 2018-12-22 | Discharge: 2018-12-22 | Disposition: A | Discharge status: Home / Self Care | Payer: BC Managed Care – PPO | Source: Ambulatory Visit | Attending: Internal Medicine | Admitting: Internal Medicine

## 2018-12-22 DIAGNOSIS — M5126 Other intervertebral disc displacement, lumbar region: Secondary | ICD-10-CM | POA: Diagnosis not present

## 2018-12-22 DIAGNOSIS — M545 Low back pain, unspecified: Secondary | ICD-10-CM

## 2018-12-23 ENCOUNTER — Other Ambulatory Visit: Payer: BC Managed Care – PPO

## 2018-12-25 ENCOUNTER — Other Ambulatory Visit: Payer: Self-pay

## 2018-12-25 ENCOUNTER — Other Ambulatory Visit: Payer: BC Managed Care – PPO

## 2018-12-25 ENCOUNTER — Ambulatory Visit (INDEPENDENT_AMBULATORY_CARE_PROVIDER_SITE_OTHER): Payer: BC Managed Care – PPO

## 2018-12-25 DIAGNOSIS — I5022 Chronic systolic (congestive) heart failure: Secondary | ICD-10-CM

## 2018-12-27 NOTE — Progress Notes (Signed)
Follow up visit  Subjective:   Stephen Maldonado, male    DOB: 1955/11/24, 63 y.o.   MRN: [id number]   I connected with the patient on 01/04/2019 by a telephone call and verified that I am speaking with the correct person using two identifiers.     I offered the patient a video enabled application for a virtual visit. Unfortunately, this could not be accomplished due to technical difficulties/lack of video enabled phone/computer. I discussed the limitations of evaluation and management by telemedicine and the availability of in person appointments. The patient expressed understanding and agreed to proceed.   This visit type was conducted due to national recommendations for restrictions regarding the COVID-19 Pandemic (e.g. social distancing).  This format is felt to be most appropriate for this patient at this time.  All issues noted in this document were discussed and addressed.  No physical exam was performed (except for noted visual exam findings with Tele health visits).  The patient has consented to conduct a Tele health visit and understands insurance will be billed.   Chief Complaint  Patient presents with  . Congestive Heart Failure    63 y.o. Caucasian male with hypertension, hyperlipidemia, nonischemic cardiomyopathy, persistent atrial fibrillation, morbid obesity, referred for management of atrial fibrillation, heart failure.  He is doing well. He has lost 14 lbs since last office visit. Leg swelling has improved. He has difficulty sleeping. He never underwent sleep study.   He is currently taking carvedilol 6.125 mg once daily, instead of bid.    Past Medical History:  Diagnosis Date  . Arthritis    knees  . Bladder spasms   . BPH (benign prostatic hyperplasia)   . DVT (deep venous thrombosis) ([place]) ocyt 2016   left leg tx medically  . GERD (gastroesophageal reflux disease)   . History of colon polyps    hyperplastic 06-14-2010  . Hydronephrosis   . Hypertension   .  Obesity, morbid ([place])   . Urinary retention      Past Surgical History:  Procedure Laterality Date  . CARDIOVERSION N/A 08/14/2018   Procedure: CARDIOVERSION;  Surgeon: Nigel Mormon, MD;  Location: [facility];  Service: Cardiovascular;  Laterality: N/A;  . COLONOSCOPY  06/14/2010  . CORONARY ANGIOGRAPHY N/A 07/11/2016   Procedure: Coronary Angiography;  Surgeon: Dixie Dials, MD;  Location: [facility];  Service: Cardiovascular;  Laterality: N/A;  . CYSTOSCOPY W/ RETROGRADES Bilateral 10/03/2015   Procedure: CYSTOSCOPY WITH RETROGRADE PYELOGRAM;  Surgeon: [name], MD;  Location: [facility];  Service: Urology;  Laterality: Bilateral;  . GREEN LIGHT LASER TURP (TRANSURETHRAL RESECTION OF PROSTATE N/A 10/03/2015   Procedure: GREEN LIGHT LASER,  TURP - STAGED(TRANSURETHRAL RESECTION OF PROSTATE;  Surgeon: [name], MD;  Location: [facility];  Service: Urology;  Laterality: N/A;  . INTRAVASCULAR PRESSURE WIRE/FFR STUDY N/A 07/14/2018   Procedure: INTRAVASCULAR PRESSURE WIRE/FFR STUDY;  Surgeon: Nigel Mormon, MD;  Location: [facility];  Service: Cardiovascular;  Laterality: N/A;  . KNEE ARTHROSCOPY     not sure which knee  . LEFT HEART CATH AND CORONARY ANGIOGRAPHY N/A 07/14/2018   Procedure: LEFT HEART CATH AND CORONARY ANGIOGRAPHY;  Surgeon: Nigel Mormon, MD;  Location: [facility];  Service: Cardiovascular;  Laterality: N/A;  . QUADRICEPS TENDON REPAIR Bilateral left 02/24/2003/   right 2006  . TOTAL KNEE ARTHROPLASTY Right 12/01/2015   Procedure: RIGHT TOTAL KNEE ARTHROPLASTY;  Surgeon: [name], MD;  Location: [facility];  Service: Orthopedics;  Laterality: Right;  . TOTAL KNEE ARTHROPLASTY Left 01/10/2017   Procedure: LEFT TOTAL KNEE ARTHROPLASTY;  Surgeon: [name], MD;  Location: [facility];  Service: Orthopedics;  Laterality: Left;  . TOTAL SHOULDER ARTHROPLASTY Right 01/23/2018    Procedure: RIGHT REVERSE TOTAL SHOULDER ARTHROPLASTY;  Surgeon: Meredith Pel, MD;  Location: [facility];  Service: Orthopedics;  Laterality: Right;     Social History   Socioeconomic History  . Marital status: Married    Spouse name: Not on file  . Number of children: 1  . Years of education: Not on file  . Highest education level: Not on file  Occupational History  . Not on file  Social Needs  . Financial resource strain: Not on file  . Food insecurity    Worry: Not on file    Inability: Not on file  . Transportation needs    Medical: Not on file    Non-medical: Not on file  Tobacco Use  . Smoking status: Never Smoker  . Smokeless tobacco: Former Systems developer    Types: Chew  Substance and Sexual Activity  . Alcohol use: Yes    Comment: 1-2 beers days  . Drug use: No  . Sexual activity: Not Currently  Lifestyle  . Physical activity    Days per week: Not on file    Minutes per session: Not on file  . Stress: Not on file  Relationships  . Social Herbalist on phone: Not on file    Gets together: Not on file    Attends religious service: Not on file    Active member of club or organization: Not on file    Attends meetings of clubs or organizations: Not on file    Relationship status: Not on file  . Intimate partner violence    Fear of current or ex partner: Not on file    Emotionally abused: Not on file    Physically abused: Not on file    Forced sexual activity: Not on file  Other Topics Concern  . Not on file  Social History Narrative  . Not on file     Family History  Problem Relation Age of Onset  . CAD Mother   . Lung cancer Mother   . Breast cancer Mother   . Diabetes Mother   . Heart failure Father   . CAD Father   . Diabetes Father      Current Outpatient Medications on File Prior to Visit  Medication Sig Dispense Refill  . carvedilol (COREG) 6.25 MG tablet TAKE 1 TABLET (6.25 MG TOTAL) BY MOUTH 2 (TWO) TIMES DAILY WITH A MEAL. 180  tablet 1  . Cholecalciferol (VITAMIN D) 50 MCG (2000 UT) tablet Take 2,000 Units by mouth daily.    . Coenzyme Q10 (COQ-10) 200 MG CAPS Take 200 mg by mouth daily.    . cyclobenzaprine (FLEXERIL) 10 MG tablet Take 10 mg by mouth daily as needed for muscle spasms.     Marland Kitchen ELIQUIS 5 MG TABS tablet TAKE 1 TABLET BY MOUTH TWICE A DAY 60 tablet 3  . furosemide (LASIX) 80 MG tablet Take 1 tablet (80 mg total) by mouth 2 (two) times daily. 90 tablet 2  . Garlic 7035 MG CAPS Take 1,000 mg by mouth daily.    . hydrALAZINE (APRESOLINE) 25 MG tablet Take 25 mg by mouth 2 (two) times daily with a meal.  3  . Ketotifen Fumarate (  ALLERGY EYE DROPS OP) Place 1 drop into both eyes daily as needed (allergies).    . losartan (COZAAR) 25 MG tablet Take 1 tablet (25 mg total) by mouth daily. 30 tablet 1  . naphazoline-pheniramine (ALLERGY EYE) 0.025-0.3 % ophthalmic solution Place 1 drop into both eyes 4 (four) times daily as needed for eye irritation.    . Omega-3 Fatty Acids (FISH OIL) 1000 MG CAPS Take 1,000 mg by mouth daily.     Marland Kitchen oxyCODONE (OXY IR/ROXICODONE) 5 MG immediate release tablet Take 2 tablets (10 mg total) by mouth every 4 (four) hours as needed for moderate pain (pain score 4-6). (Patient taking differently: Take 5 mg by mouth every 4 (four) hours as needed for moderate pain (pain score 4-6). ) 42 tablet 0  . Potassium Chloride ER 20 MEQ TBCR Take 20 mEq by mouth daily. with food    . tamsulosin (FLOMAX) 0.4 MG CAPS capsule Take 0.4 mg by mouth every morning.    Marland Kitchen tiZANidine (ZANAFLEX) 4 MG tablet TAKE 1 TABLET (4 MG TOTAL) BY MOUTH EVERY 8 (EIGHT) HOURS AS NEEDED FOR MUSCLE SPASMS. 60 tablet 0  . Turmeric 500 MG TABS Take 500 mg by mouth daily.    . vitamin B-12 (CYANOCOBALAMIN) 1000 MCG tablet Take 1,000 mcg by mouth daily.    . vitamin C (ASCORBIC ACID) 500 MG tablet Take 500 mg by mouth daily.      No current facility-administered medications on file prior to visit.     Cardiovascular studies:   Echocardiogram 12/25/2018: Left ventricle cavity is normal in size. Severe concentric hypertrophy of the left ventricle. Moderately depressed LV systolic function with moderate global hypokinesis. LVEF 35-40%. Doppler evidence of grade I (impaired) diastolic dysfunction, normal LAP. Calculated EF 38%. Left atrial cavity is moderately dilated.. Probably trileaflet aortic valve with mild aortic valve leaflet calcification. Mild aortic stenosis. Aortic valve mean gradient of 10 mmHg, Vmax of 2.0  m/s. Calculated aortic valve area by continuity equation is 1.5 cm.  No regurgitation noted. IVC is dilated with a respiratory response of >50%. Estimated RA pressure 8 mmHg. Compared to previous study on 07/06/2018, LVEF improved from 20% to 35-40%.   EKG 08/24/2018: Sinus rhythm 86 bpm.  Left atrial enlargement.  EKG 08/10/2018: Atrial fibrillation with RVR. No significant change compared to previous EKG.   Left/right heart cath 07/14/2018: LM: Normal LAD: No significant disease LCx: Mid LCx focal 60% stenosis. dFR 0.97 (Non-significant) RCA: Prox 30%, mid 50% focal stenoses. LVEDP mildly elevated.  Impression: Nonobstructive CAD Nonischemic cardiomyopathy  EKG 07/01/2018: Afib with controlled ventricular rate  Recent labs: Results for HAZEN, BRUMETT (MRN [id number]) as of 08/10/2018 11:01  Ref. Range 07/27/2018 12:01 08/06/2018 79:39  BASIC METABOLIC PANEL Unknown Rpt (A) Rpt (A)  Sodium Latest Ref Range: 134 - 144 mmol/L 141 142  Potassium Latest Ref Range: 3.5 - 5.2 mmol/L 4.6 4.8  Chloride Latest Ref Range: 96 - 106 mmol/L 99 98  CO2 Latest Ref Range: 20 - 29 mmol/L 28 27  Glucose Latest Ref Range: 65 - 99 mg/dL 88 80  BUN Latest Ref Range: 8 - 27 mg/dL 31 (H) 29 (H)  Creatinine Latest Ref Range: 0.76 - 1.27 mg/dL 2.01 (H) 1.98 (H)  Calcium Latest Ref Range: 8.6 - 10.2 mg/dL 9.1 9.2  BUN/Creatinine Ratio Latest Ref Range: 10 - _0 GFR, Est Non African American Latest Ref  Range: >59 mL/min/1.73 34 (L) 35 (L)  GFR, Est African American  Latest Ref Range: >59 mL/min/1.73 40 (L) 40 (L)     07/16/2018: Glucose 10. BUN/Cr 25/1.76, eGFR 40. Na/K 143/4.2  07/01/2018:  Glucose 87. BUN/Cr 24/1.9. eGFR 36. CMP otherwise normal.  H/H 14/44. Platelets 163. BNP 164. Chol 161, TG 230, HDL 47, non-HDL cholesterol 114.   Review of Systems  Constitution: Negative for decreased appetite, malaise/fatigue, weight gain and weight loss.  HENT: Negative for congestion.   Eyes: Negative for visual disturbance.  Cardiovascular: Positive for dyspnea on exertion and leg swelling. Negative for chest pain, palpitations and syncope.  Respiratory: Positive for shortness of breath. Negative for cough.   Endocrine: Negative for cold intolerance.  Hematologic/Lymphatic: Does not bruise/bleed easily.  Skin: Negative for itching and rash.  Musculoskeletal: Negative for myalgias.  Gastrointestinal: Negative for abdominal pain, nausea and vomiting.  Genitourinary: Negative for dysuria.  Neurological: Negative for dizziness and weakness.  Psychiatric/Behavioral: The patient is not nervous/anxious.   All other systems reviewed and are negative.       No vitals available.  Objective:   Physical Exam   Not performed. Telephone visit.       Assessment & Recommendations:   63 y.o. Caucasian male with hypertension, hyperlipidemia, morbid obesity, persistent atrial fibrillation, heart failure.with reduced ejection fraction.  1. Chronic systolic heart failure ([place]): NYHA class II-III symptoms. Nonischemic cardiomyopathy Etiology multifactorial, including Afib, obesity, OSA. Real function stable with CKD stage 3.  Continue lasix to 80 mg bid, losartan  25 mg daily, carvedilol at recommended dose 6.25 mg bid, hydralazine 25 mg bid.  Unable to use spironolactone due to CKD.   2. Afib: Now maintaining sinus rhythm s/p cardioversion 08/14/2018. CHA2DS2VASc score 3, annual stroke  risk 3% Continue eliquis 5 mg bid.   3. Essential hypertension Better controlled.   4. Morbid obesity ([place]) Diet and lifestyle modification, salt and calorie restriction diet.  5. Sleep abnormality: Referred to sleep study again, suspecting OSA  Follow up in 3 months.   Nigel Mormon, MD [facility] Pager: [phone] Office: [phone] If no answer Cell [phone]

## 2019-01-04 ENCOUNTER — Encounter: Payer: Self-pay | Admitting: Cardiology

## 2019-01-04 ENCOUNTER — Telehealth: Payer: BC Managed Care – PPO | Admitting: Cardiology

## 2019-01-04 VITALS — Ht 71.0 in | Wt 310.0 lb

## 2019-01-04 DIAGNOSIS — I480 Paroxysmal atrial fibrillation: Secondary | ICD-10-CM | POA: Diagnosis not present

## 2019-01-04 DIAGNOSIS — M545 Low back pain: Secondary | ICD-10-CM | POA: Diagnosis not present

## 2019-01-04 DIAGNOSIS — M25551 Pain in right hip: Secondary | ICD-10-CM | POA: Diagnosis not present

## 2019-01-04 DIAGNOSIS — I5022 Chronic systolic (congestive) heart failure: Secondary | ICD-10-CM | POA: Diagnosis not present

## 2019-01-04 DIAGNOSIS — R0683 Snoring: Secondary | ICD-10-CM | POA: Diagnosis not present

## 2019-01-13 ENCOUNTER — Encounter: Payer: Self-pay | Admitting: Neurology

## 2019-01-13 ENCOUNTER — Other Ambulatory Visit: Payer: Self-pay

## 2019-01-13 ENCOUNTER — Ambulatory Visit (INDEPENDENT_AMBULATORY_CARE_PROVIDER_SITE_OTHER): Payer: BC Managed Care – PPO | Admitting: Neurology

## 2019-01-13 VITALS — BP 150/85 | HR 81 | Ht 72.0 in | Wt 339.0 lb

## 2019-01-13 DIAGNOSIS — R600 Localized edema: Secondary | ICD-10-CM

## 2019-01-13 DIAGNOSIS — G4719 Other hypersomnia: Secondary | ICD-10-CM | POA: Diagnosis not present

## 2019-01-13 DIAGNOSIS — I5022 Chronic systolic (congestive) heart failure: Secondary | ICD-10-CM

## 2019-01-13 DIAGNOSIS — I480 Paroxysmal atrial fibrillation: Secondary | ICD-10-CM

## 2019-01-13 DIAGNOSIS — R0683 Snoring: Secondary | ICD-10-CM

## 2019-01-13 DIAGNOSIS — Z6841 Body Mass Index (BMI) 40.0 and over, adult: Secondary | ICD-10-CM

## 2019-01-13 NOTE — Patient Instructions (Signed)

## 2019-01-13 NOTE — Progress Notes (Signed)
Subjective:    Patient ID: Stephen Maldonado is a 63 y.o. male.  HPI     Star Age, MD, PhD [facility] [address], [address] [place], [place] [address]  Dear Dr. Virgina Jock,   I saw your patient, Stephen Maldonado, upon your kind request in my sleep clinic today for initial consultation of his sleep disorder, in particular, concern for underlying obstructive sleep apnea.  The patient is unaccompanied today.  As you know, [name] is a 63 year old right-handed gentleman with an underlying medical history of hypertension, history of hydronephrosis, history of urinary retention, reflux disease, DVT, BPH, arthritis, paroxysmal A. fib with status post cardioversion on 08/14/2018, on Eliquis, chronic systolic congestive heart failure, LBP, and morbid obesity with a BMI of over 45, who reports snoring and excessive daytime somnolence.  I reviewed your virtual visit note from 01/04/2019.  His Epworth sleepiness score is 12 out of 24, fatigue severity score is 55 out of 63.  He is retired, lives with his wife, they have 1 child.  He is a non-smoker and drinks alcohol occasionally and reports smoking marijuana.  He drinks caffeine daily in the form of coffee, 2 cups/day on average and occasional tea, typically no sodas.  He smokes marijuana occasionally, has restarted this recently due to back pain.  He has seen Dr. Ninfa Linden.  He had a lower back injection.  He reports arthritis issues and had both knees replaced, also right shoulder replacement, and has been experiencing right hip pain.  He does not sleep well.  He sleeps semiupright on 5 pillows and reports that he cannot sleep lying down.  He has slept in this position for the past few years.  He is not aware of any family history of sleep apnea.  He had weight gain.  He does not always drink enough water and reports that his wife is constantly reminding him to drink water.  They have pets in the home, 3 dogs and 2 cats.  He  is retired from Architect work.  He denies night to night nocturia or recurrent morning headaches but does not wake up rested and feels tired all the time and has lack of energy. He does not have a set schedule for his bedtime or rise time.  His Past Medical History Is Significant For: Past Medical History:  Diagnosis Date  . Arthritis    knees  . Bladder spasms   . BPH (benign prostatic hyperplasia)   . DVT (deep venous thrombosis) ([place]) ocyt 2016   left leg tx medically  . GERD (gastroesophageal reflux disease)   . History of colon polyps    hyperplastic 06-14-2010  . Hydronephrosis   . Hypertension   . Obesity, morbid ([place])   . Urinary retention     His Past Surgical History Is Significant For: Past Surgical History:  Procedure Laterality Date  . CARDIOVERSION N/A 08/14/2018   Procedure: CARDIOVERSION;  Surgeon: Nigel Mormon, MD;  Location: [facility];  Service: Cardiovascular;  Laterality: N/A;  . COLONOSCOPY  06/14/2010  . CORONARY ANGIOGRAPHY N/A 07/11/2016   Procedure: Coronary Angiography;  Surgeon: Dixie Dials, MD;  Location: [place] [facility];  Service: Cardiovascular;  Laterality: N/A;  . CYSTOSCOPY W/ RETROGRADES Bilateral 10/03/2015   Procedure: CYSTOSCOPY WITH RETROGRADE PYELOGRAM;  Surgeon: [name], MD;  Location: [facility];  Service: Urology;  Laterality: Bilateral;  . GREEN LIGHT LASER TURP (TRANSURETHRAL RESECTION OF PROSTATE N/A 10/03/2015   Procedure: GREEN  LIGHT LASER,  TURP - STAGED(TRANSURETHRAL RESECTION OF PROSTATE;  Surgeon: [name], MD;  Location: [facility];  Service: Urology;  Laterality: N/A;  . INTRAVASCULAR PRESSURE WIRE/FFR STUDY N/A 07/14/2018   Procedure: INTRAVASCULAR PRESSURE WIRE/FFR STUDY;  Surgeon: Nigel Mormon, MD;  Location: [place] [facility];  Service: Cardiovascular;  Laterality: N/A;  . KNEE ARTHROSCOPY     not sure which knee  . LEFT HEART CATH AND CORONARY  ANGIOGRAPHY N/A 07/14/2018   Procedure: LEFT HEART CATH AND CORONARY ANGIOGRAPHY;  Surgeon: Nigel Mormon, MD;  Location: [place] [facility];  Service: Cardiovascular;  Laterality: N/A;  . QUADRICEPS TENDON REPAIR Bilateral left 02/24/2003/   right 2006  . TOTAL KNEE ARTHROPLASTY Right 12/01/2015   Procedure: RIGHT TOTAL KNEE ARTHROPLASTY;  Surgeon: [name], MD;  Location[facility];  Service: Orthopedics;  Laterality: Right;  . TOTAL KNEE ARTHROPLASTY Left 01/10/2017   Procedure: LEFT TOTAL KNEE ARTHROPLASTY;  Surgeon: [name], MD;  Location: [facility];  Service: Orthopedics;  Laterality: Left;  . TOTAL SHOULDER ARTHROPLASTY Right 01/23/2018   Procedure: RIGHT REVERSE TOTAL SHOULDER ARTHROPLASTY;  Surgeon: Meredith Pel, MD;  Location: [place];  Service: Orthopedics;  Laterality: Right;    His Family History Is Significant For: Family History  Problem Relation Age of Onset  . CAD Mother   . Lung cancer Mother   . Breast cancer Mother   . Diabetes Mother   . Heart failure Father   . CAD Father   . Diabetes Father     His Social History Is Significant For: Social History   Socioeconomic History  . Marital status: Married    Spouse name: Not on file  . Number of children: 1  . Years of education: Not on file  . Highest education level: Not on file  Occupational History  . Not on file  Social Needs  . Financial resource strain: Not on file  . Food insecurity    Worry: Not on file    Inability: Not on file  . Transportation needs    Medical: Not on file    Non-medical: Not on file  Tobacco Use  . Smoking status: Never Smoker  . Smokeless tobacco: Former Systems developer    Types: Chew  Substance and Sexual Activity  . Alcohol use: Yes    Comment: 1-2 beers days  . Drug use: No  . Sexual activity: Not Currently  Lifestyle  . Physical activity    Days per week: Not on file    Minutes per session: Not on file  . Stress: Not on file  Relationships   . Social Herbalist on phone: Not on file    Gets together: Not on file    Attends religious service: Not on file    Active member of club or organization: Not on file    Attends meetings of clubs or organizations: Not on file    Relationship status: Not on file  Other Topics Concern  . Not on file  Social History Narrative  . Not on file    His Allergies Are:  Allergies  Allergen Reactions  . Lipitor [Atorvastatin] Other (See Comments)    Muscle fatigue, soreness and dreams crazy  . Aspirin     Avoid to kidney function  . Simvastatin Other (See Comments)    Fatigue, soreness  :   His Current Medications Are:  Outpatient Encounter Medications as of 01/13/2019  Medication Sig  .  carvedilol (COREG) 6.25 MG tablet TAKE 1 TABLET (6.25 MG TOTAL) BY MOUTH 2 (TWO) TIMES DAILY WITH A MEAL. (Patient taking differently: Take 6.25 mg by mouth daily. )  . Cholecalciferol (VITAMIN D) 50 MCG (2000 UT) tablet Take 2,000 Units by mouth daily.  . Coenzyme Q10 (COQ-10) 200 MG CAPS Take 200 mg by mouth daily.  . cyclobenzaprine (FLEXERIL) 10 MG tablet Take 10 mg by mouth daily as needed for muscle spasms.   Marland Kitchen ELIQUIS 5 MG TABS tablet TAKE 1 TABLET BY MOUTH TWICE A DAY  . furosemide (LASIX) 80 MG tablet Take 1 tablet (80 mg total) by mouth 2 (two) times daily.  . Garlic 123XX123 MG CAPS Take 1,000 mg by mouth daily.  . hydrALAZINE (APRESOLINE) 25 MG tablet Take 25 mg by mouth 2 (two) times daily with a meal.  . Ketotifen Fumarate (ALLERGY EYE DROPS OP) Place 1 drop into both eyes daily as needed (allergies).  . naphazoline-pheniramine (ALLERGY EYE) 0.025-0.3 % ophthalmic solution Place 1 drop into both eyes 4 (four) times daily as needed for eye irritation.  . Omega-3 Fatty Acids (FISH OIL) 1000 MG CAPS Take 1,000 mg by mouth daily.   Marland Kitchen oxyCODONE (OXY IR/ROXICODONE) 5 MG immediate release tablet Take 2 tablets (10 mg total) by mouth every 4 (four) hours as needed for moderate pain (pain  score 4-6). (Patient taking differently: Take 5 mg by mouth every 4 (four) hours as needed for moderate pain (pain score 4-6). )  . Potassium Chloride ER 20 MEQ TBCR Take 20 mEq by mouth daily. with food  . tamsulosin (FLOMAX) 0.4 MG CAPS capsule Take 0.4 mg by mouth every morning.  Marland Kitchen tiZANidine (ZANAFLEX) 4 MG tablet TAKE 1 TABLET (4 MG TOTAL) BY MOUTH EVERY 8 (EIGHT) HOURS AS NEEDED FOR MUSCLE SPASMS.  . Turmeric 500 MG TABS Take 500 mg by mouth daily.  . vitamin B-12 (CYANOCOBALAMIN) 1000 MCG tablet Take 1,000 mcg by mouth daily.  . vitamin C (ASCORBIC ACID) 500 MG tablet Take 500 mg by mouth daily.   Marland Kitchen losartan (COZAAR) 25 MG tablet Take 1 tablet (25 mg total) by mouth daily.   No facility-administered encounter medications on file as of 01/13/2019.   :  Review of Systems:  Out of a complete 14 point review of systems, all are reviewed and negative with the exception of these symptoms as listed below: Review of Systems  Neurological:       Pt presents today to discuss his sleep. Pt has never had a sleep study but does endorse snoring.  Epworth Sleepiness Scale 0= would never doze 1= slight chance of dozing 2= moderate chance of dozing 3= high chance of dozing  Sitting and reading: 2 Watching TV: 2 Sitting inactive in a public place (ex. Theater or meeting): 2 As a passenger in a car for an hour without a break: 1 Lying down to rest in the afternoon: 2 Sitting and talking to someone: 1 Sitting quietly after lunch (no alcohol): 1 In a car, while stopped in traffic: 0 Total: 12     Objective:  Neurological Exam  Physical Exam Physical Examination:   Vitals:   01/13/19 1050  BP: (!) 150/85  Pulse: 81    General Examination: The patient is a very pleasant 63 y.o. male in no acute distress. He appears well-developed and well-nourished and well groomed.   HEENT: Normocephalic, atraumatic, pupils are equal, round and reactive to light, extraocular tracking is good without  limitation to gaze  excursion or nystagmus noted. Hearing is grossly intact. Face is symmetric with normal facial animation. Speech is clear with no dysarthria noted. There is no hypophonia. There is no lip, neck/head, jaw or voice tremor. Neck is supple with full range of passive and active motion. There are no carotid bruits on auscultation. Full beard.  Oropharynx exam reveals: moderate mouth dryness, adequate dental hygiene and moderate airway crowding, due to Small airway entry and redundant soft palate, tonsils of 1-2+ bilaterally, Mallampati class III, neck circumference of 20-1/2 inches.  He has a mild to moderate overbite. Tongue protrudes centrally and palate elevates symmetrically.   Chest: Clear to auscultation without wheezing, rhonchi or crackles noted.  Heart: S1+S2+0, regular and normal without murmurs, rubs or gallops noted.   Abdomen: Soft, non-tender and non-distended with normal bowel sounds appreciated on auscultation.  Extremities: There is 2+ pitting edema in the distal lower extremities bilaterally.   Skin: Warm and dry with Chronic stasis-like changes seen in the distal lower extremities bilaterally, with redness and thickening of the skin.   Musculoskeletal: exam reveals Low back pain, right hip pain.  Neurologically:  Mental status: The patient is awake, alert and oriented in all 4 spheres. His immediate and remote memory, attention, language skills and fund of knowledge are appropriate. There is no evidence of aphasia, agnosia, apraxia or anomia. Speech is clear with normal prosody and enunciation. Thought process is linear. Mood is normal and affect is normal.  Cranial nerves II - XII are as described above under HEENT exam.  Motor exam: Normal bulk, strength and tone is noted. There is no tremor, Romberg is negative. fine motor skills and coordination: grossly intact.  Cerebellar testing: No dysmetria or intention tremor. There is no truncal or gait ataxia.  Sensory  exam: intact to light touch in the upper and lower extremities.  Gait, station and balance: He stands with difficulty. No veering to one side is noted. No leaning to one side is noted. Posture is age-appropriate and stance is narrow based. Gait shows Slow and cautious gait, slight limp on the right, walks mildly wide-based.                Assessment and Plan:  In summary, MEHRAN MARLATT is a very pleasant 63 y.o.-year old male with an underlying medical history of hypertension, history of hydronephrosis, history of urinary retention, reflux disease, DVT, BPH, arthritis, paroxysmal A. fib with status post cardioversion on 08/14/2018, on Eliquis, chronic systolic congestive heart failure, LBP, and morbid obesity with a BMI of over 45, whose history and physical exam are concerning for obstructive sleep apnea (OSA). I had a long chat with the patient about my findings and the diagnosis of OSA, its prognosis and treatment options. We talked about medical treatments, surgical interventions and non-pharmacological approaches. I explained in particular the risks and ramifications of untreated moderate to severe OSA, especially with respect to developing cardiovascular disease down the Road, including congestive heart failure, difficult to treat hypertension, cardiac arrhythmias, or stroke. Even type 2 diabetes has, in part, been linked to untreated OSA. Symptoms of untreated OSA include daytime sleepiness, memory problems, mood irritability and mood disorder such as depression and anxiety, lack of energy, as well as recurrent headaches, especially morning headaches. We talked about trying to maintain a healthy lifestyle in general, as well as the importance of weight control. We also talked about the importance of good sleep hygiene. I recommended the following at this time: sleep study.  I explained  the sleep test procedure to the patient and also outlined possible surgical and non-surgical treatment options of OSA,  including the use of a custom-made dental device (which would require a referral to a specialist dentist or oral surgeon), upper airway surgical options, such as traditional UPPP or a novel less invasive surgical option in the form of Inspire hypoglossal nerve stimulation (which would involve a referral to an ENT surgeon). I also explained the CPAP treatment option to the patient, who indicated that he would be willing to try CPAP if the need arises. I explained the importance of being compliant with PAP treatment, not only for insurance purposes but primarily to improve His symptoms, and for the patient's long term health benefit, including to reduce His cardiovascular risks. I answered all his questions today and the patient was in agreement. I plan to see him back after the sleep study is completed and encouraged him to call with any interim questions, concerns, problems or updates.   Thank you very much for allowing me to participate in the care of this nice patient. If I can be of any further assistance to you please do not hesitate to call me at [phone].  Sincerely,   Star Age, MD, PhD

## 2019-01-18 DIAGNOSIS — Z6841 Body Mass Index (BMI) 40.0 and over, adult: Secondary | ICD-10-CM | POA: Diagnosis not present

## 2019-01-18 DIAGNOSIS — M545 Low back pain: Secondary | ICD-10-CM | POA: Diagnosis not present

## 2019-02-03 ENCOUNTER — Telehealth: Payer: Self-pay

## 2019-02-03 NOTE — Telephone Encounter (Signed)
We have attempted to call the patient two times to schedule sleep study.  Patient has been unavailable at the phone numbers we have on file and has not returned our calls.  Unable to leave a voicemail for patient, due to vm box not set up yet.  If patient calls back we will schedule them for their sleep study.

## 2019-03-08 ENCOUNTER — Ambulatory Visit (INDEPENDENT_AMBULATORY_CARE_PROVIDER_SITE_OTHER): Payer: BC Managed Care – PPO | Admitting: Orthopaedic Surgery

## 2019-03-08 ENCOUNTER — Encounter: Payer: Self-pay | Admitting: Orthopaedic Surgery

## 2019-03-08 ENCOUNTER — Other Ambulatory Visit: Payer: Self-pay

## 2019-03-08 DIAGNOSIS — M5416 Radiculopathy, lumbar region: Secondary | ICD-10-CM | POA: Diagnosis not present

## 2019-03-08 NOTE — Progress Notes (Signed)
Office Visit Note   Patient: Stephen Maldonado           Date of Birth: [birth year]           MRN: [id number] Visit Date: 03/08/2019              Requested by: Josetta Huddle, MD [address] [place],  [place] [address] PCP: Josetta Huddle, MD   Assessment & Plan: Visit Diagnoses:  1. Lumbar radicular pain     Plan: We will have him undergo a lumbar epidural steroid injection L5-S1.  Again is on Eliquis most likely will have to come off of it is to undergo the epidural steroid injection.  He will follow up with [place] 2 weeks after the injection to see what type of response he had to this.  Questions were encouraged and answered at length today.  Follow-Up Instructions: Return 2 weeks after epidural steroid injection.   Orders:  No orders of the defined types were placed in this encounter.  No orders of the defined types were placed in this encounter.     Procedures: No procedures performed   Clinical Data: No additional findings.   Subjective: Chief Complaint  Patient presents with  . Right Hip - Pain    HPI Mr. [name] is Dr. Ninfa Linden service comes in today with right hip and buttocks pain for several months.  He states the pain is worse whenever he standing radiates down his entire right leg goes into the lateral aspect of his foot and he has numbness lateral aspect of the foot.  Pain does awaken him he denies any bowel bladder dysfunction.  He did undergo an MRI of his lumbar spine 12/23/2018 which showed a central disc protrusion contacting bilateral S1 nerve roots.  Radiographs of his right hip performed October 2020 showed no acute fracture and showed the joint space to be well-maintained.  Personally reviewed the radiographs today.  States the buttocks pain does awaken him.  Says he is exhausted from the pain and not sleeping.  Past medical history pertinent for history of DVT, essential hypertension, chronic systolic heart failure, atrial fibrillation on  Eliquis.  Review of Systems Negative for fevers chills.  Please see HPI  Objective: Vital Signs: There were no vitals taken for this visit.  Physical Exam Constitutional:      Appearance: He is obese. He is not ill-appearing or diaphoretic.  Pulmonary:     Effort: Pulmonary effort is normal.  Neurological:     Mental Status: He is alert and oriented to person, place, and time.  Psychiatric:        Mood and Affect: Mood normal.     Ortho Exam Positive straight leg raise on the right.  5 out of 5 strength throughout lower extremities against resistance.  Good range of motion both hips without pain.  Nontender of the trochanteric region both hips. Specialty Comments:  No specialty comments available.  Imaging: No results found.   PMFS History: Patient Active Problem List   Diagnosis Date Noted  . Snoring 01/04/2019  . Longstanding persistent atrial fibrillation ([place]) 07/31/2018  . CKD (chronic kidney disease) stage 3, GFR 30-59 ml/min 07/31/2018  . Chronic systolic heart failure ([place]) 07/06/2018  . Paroxysmal atrial fibrillation ([place]) 07/01/2018  . Heart failure due to valvular disease ([place]) 07/01/2018  . Essential hypertension 07/01/2018  . Morbid obesity ([place]) 07/01/2018  . Primary osteoarthritis, right shoulder   . Shoulder arthritis 01/23/2018  . Arthritis of  right shoulder region 08/25/2017  . Chronic right shoulder pain 08/25/2017  . Total knee replacement status, left 01/11/2017  . Status post total left knee replacement 01/10/2017  . Chronic pain of left knee 11/04/2016  . Chronic bilateral low back pain with right-sided sciatica 11/04/2016  . Acute coronary syndrome ([place]) 07/10/2016  . Unilateral primary osteoarthritis, left knee 06/26/2016  . H/O total knee replacement, right 06/26/2016  . Unilateral primary osteoarthritis, right knee 12/01/2015  . Status post total right knee replacement 12/01/2015   Past Medical History:  Diagnosis Date  . Arthritis      knees  . Bladder spasms   . BPH (benign prostatic hyperplasia)   . DVT (deep venous thrombosis) ([place]) ocyt 2016   left leg tx medically  . GERD (gastroesophageal reflux disease)   . History of colon polyps    hyperplastic 06-14-2010  . Hydronephrosis   . Hypertension   . Obesity, morbid ([place])   . Urinary retention     Family History  Problem Relation Age of Onset  . CAD Mother   . Lung cancer Mother   . Breast cancer Mother   . Diabetes Mother   . Heart failure Father   . CAD Father   . Diabetes Father     Past Surgical History:  Procedure Laterality Date  . CARDIOVERSION N/A 08/14/2018   Procedure: CARDIOVERSION;  Surgeon: Nigel Mormon, MD;  Location: [facility];  Service: Cardiovascular;  Laterality: N/A;  . COLONOSCOPY  06/14/2010  . CORONARY ANGIOGRAPHY N/A 07/11/2016   Procedure: Coronary Angiography;  Surgeon: Dixie Dials, MD;  Location: [place] [facility];  Service: Cardiovascular;  Laterality: N/A;  . CYSTOSCOPY W/ RETROGRADES Bilateral 10/03/2015   Procedure: CYSTOSCOPY WITH RETROGRADE PYELOGRAM;  Surgeon: [name], MD;  Location: [facility];  Service: Urology;  Laterality: Bilateral;  . GREEN LIGHT LASER TURP (TRANSURETHRAL RESECTION OF PROSTATE N/A 10/03/2015   Procedure: GREEN LIGHT LASER,  TURP - STAGED(TRANSURETHRAL RESECTION OF PROSTATE;  Surgeon: [name], MD;  Location[facility];  Service: Urology;  Laterality: N/A;  . INTRAVASCULAR PRESSURE WIRE/FFR STUDY N/A 07/14/2018   Procedure: INTRAVASCULAR PRESSURE WIRE/FFR STUDY;  Surgeon: Nigel Mormon, MD;  Location: [place] [facility];  Service: Cardiovascular;  Laterality: N/A;  . KNEE ARTHROSCOPY     not sure which knee  . LEFT HEART CATH AND CORONARY ANGIOGRAPHY N/A 07/14/2018   Procedure: LEFT HEART CATH AND CORONARY ANGIOGRAPHY;  Surgeon: Nigel Mormon, MD;  Location: [place] [facility];  Service: Cardiovascular;  Laterality: N/A;  .  QUADRICEPS TENDON REPAIR Bilateral left 02/24/2003/   right 2006  . TOTAL KNEE ARTHROPLASTY Right 12/01/2015   Procedure: RIGHT TOTAL KNEE ARTHROPLASTY;  Surgeon: [name], MD;  [facility];  Service: Orthopedics;  Laterality: Right;  . TOTAL KNEE ARTHROPLASTY Left 01/10/2017   Procedure: LEFT TOTAL KNEE ARTHROPLASTY;  Surgeon: [name], MD;  [facility];  Service: Orthopedics;  Laterality: Left;  . TOTAL SHOULDER ARTHROPLASTY Right 01/23/2018   Procedure: RIGHT REVERSE TOTAL SHOULDER ARTHROPLASTY;  Surgeon: Meredith Pel, MD;  Location: [facility];  Service: Orthopedics;  Laterality: Right;   Social History   Occupational History  . Not on file  Tobacco Use  . Smoking status: Never Smoker  . Smokeless tobacco: Former Systems developer    Types: Chew  Substance and Sexual Activity  . Alcohol use: Yes    Comment: 1-2 beers days  . Drug use: No  .  Sexual activity: Not Currently

## 2019-03-09 ENCOUNTER — Other Ambulatory Visit: Payer: Self-pay | Admitting: Radiology

## 2019-03-09 DIAGNOSIS — M5416 Radiculopathy, lumbar region: Secondary | ICD-10-CM

## 2019-03-22 ENCOUNTER — Encounter: Payer: BC Managed Care – PPO | Admitting: Physical Medicine and Rehabilitation

## 2019-03-22 DIAGNOSIS — M545 Low back pain: Secondary | ICD-10-CM | POA: Diagnosis not present

## 2019-03-22 DIAGNOSIS — I509 Heart failure, unspecified: Secondary | ICD-10-CM | POA: Diagnosis not present

## 2019-03-22 DIAGNOSIS — L0390 Cellulitis, unspecified: Secondary | ICD-10-CM | POA: Diagnosis not present

## 2019-03-22 DIAGNOSIS — I129 Hypertensive chronic kidney disease with stage 1 through stage 4 chronic kidney disease, or unspecified chronic kidney disease: Secondary | ICD-10-CM | POA: Diagnosis not present

## 2019-03-22 DIAGNOSIS — L905 Scar conditions and fibrosis of skin: Secondary | ICD-10-CM | POA: Diagnosis not present

## 2019-03-22 DIAGNOSIS — R0609 Other forms of dyspnea: Secondary | ICD-10-CM | POA: Diagnosis not present

## 2019-03-22 DIAGNOSIS — D225 Melanocytic nevi of trunk: Secondary | ICD-10-CM | POA: Diagnosis not present

## 2019-03-23 ENCOUNTER — Other Ambulatory Visit: Payer: Self-pay | Admitting: Internal Medicine

## 2019-03-23 DIAGNOSIS — D225 Melanocytic nevi of trunk: Secondary | ICD-10-CM | POA: Diagnosis not present

## 2019-03-23 DIAGNOSIS — L905 Scar conditions and fibrosis of skin: Secondary | ICD-10-CM | POA: Diagnosis not present

## 2019-04-05 ENCOUNTER — Other Ambulatory Visit: Payer: Self-pay | Admitting: Internal Medicine

## 2019-04-05 DIAGNOSIS — M545 Low back pain: Secondary | ICD-10-CM | POA: Diagnosis not present

## 2019-04-05 DIAGNOSIS — I509 Heart failure, unspecified: Secondary | ICD-10-CM | POA: Diagnosis not present

## 2019-04-05 DIAGNOSIS — R0609 Other forms of dyspnea: Secondary | ICD-10-CM | POA: Diagnosis not present

## 2019-04-05 DIAGNOSIS — D225 Melanocytic nevi of trunk: Secondary | ICD-10-CM | POA: Diagnosis not present

## 2019-04-07 DIAGNOSIS — G4734 Idiopathic sleep related nonobstructive alveolar hypoventilation: Secondary | ICD-10-CM | POA: Diagnosis not present

## 2019-04-07 DIAGNOSIS — I4891 Unspecified atrial fibrillation: Secondary | ICD-10-CM | POA: Diagnosis not present

## 2019-04-07 DIAGNOSIS — I509 Heart failure, unspecified: Secondary | ICD-10-CM | POA: Diagnosis not present

## 2019-04-07 DIAGNOSIS — R0681 Apnea, not elsewhere classified: Secondary | ICD-10-CM | POA: Diagnosis not present

## 2019-04-10 DIAGNOSIS — G4733 Obstructive sleep apnea (adult) (pediatric): Secondary | ICD-10-CM | POA: Diagnosis not present

## 2019-04-13 ENCOUNTER — Ambulatory Visit (INDEPENDENT_AMBULATORY_CARE_PROVIDER_SITE_OTHER): Payer: BC Managed Care – PPO | Admitting: Physical Medicine and Rehabilitation

## 2019-04-13 ENCOUNTER — Ambulatory Visit: Payer: Self-pay

## 2019-04-13 ENCOUNTER — Encounter: Payer: Self-pay | Admitting: Physical Medicine and Rehabilitation

## 2019-04-13 ENCOUNTER — Other Ambulatory Visit: Payer: Self-pay

## 2019-04-13 VITALS — BP 153/82 | HR 78

## 2019-04-13 DIAGNOSIS — M5116 Intervertebral disc disorders with radiculopathy, lumbar region: Secondary | ICD-10-CM

## 2019-04-13 DIAGNOSIS — M5416 Radiculopathy, lumbar region: Secondary | ICD-10-CM

## 2019-04-13 MED ORDER — METHYLPREDNISOLONE ACETATE 80 MG/ML IJ SUSP
40.0000 mg | Freq: Once | INTRAMUSCULAR | Status: AC
  Administered 2019-04-13: 40 mg

## 2019-04-13 NOTE — Progress Notes (Signed)
Numeric Pain Rating Scale and Functional Assessment Average Pain 10   In the last MONTH (on 0-10 scale) has pain interfered with the following?  1. General activity like being  able to carry out your everyday physical activities such as walking, climbing stairs, carrying groceries, or moving a chair?  Rating(10)   +Driver, -BT, -Dye Allergies. 

## 2019-04-16 ENCOUNTER — Other Ambulatory Visit (HOSPITAL_BASED_OUTPATIENT_CLINIC_OR_DEPARTMENT_OTHER): Payer: Self-pay

## 2019-04-16 DIAGNOSIS — R0683 Snoring: Secondary | ICD-10-CM

## 2019-04-16 DIAGNOSIS — G4730 Sleep apnea, unspecified: Secondary | ICD-10-CM

## 2019-04-16 DIAGNOSIS — G4710 Hypersomnia, unspecified: Secondary | ICD-10-CM

## 2019-04-20 ENCOUNTER — Other Ambulatory Visit (HOSPITAL_COMMUNITY)
Admission: RE | Admit: 2019-04-20 | Discharge: 2019-04-20 | Disposition: A | Discharge status: Home / Self Care | Payer: BC Managed Care – PPO | Source: Ambulatory Visit | Attending: Internal Medicine | Admitting: Internal Medicine

## 2019-04-20 DIAGNOSIS — Z20822 Contact with and (suspected) exposure to covid-19: Secondary | ICD-10-CM | POA: Insufficient documentation

## 2019-04-20 DIAGNOSIS — Z01812 Encounter for preprocedural laboratory examination: Secondary | ICD-10-CM | POA: Insufficient documentation

## 2019-04-20 LAB — SARS CORONAVIRUS 2 (TAT 6-24 HRS): SARS Coronavirus 2: NEGATIVE

## 2019-04-22 ENCOUNTER — Other Ambulatory Visit: Payer: Self-pay

## 2019-04-22 ENCOUNTER — Ambulatory Visit (HOSPITAL_BASED_OUTPATIENT_CLINIC_OR_DEPARTMENT_OTHER): Payer: BC Managed Care – PPO | Attending: Internal Medicine | Admitting: Internal Medicine

## 2019-04-22 DIAGNOSIS — G4733 Obstructive sleep apnea (adult) (pediatric): Secondary | ICD-10-CM | POA: Insufficient documentation

## 2019-04-22 DIAGNOSIS — R0681 Apnea, not elsewhere classified: Secondary | ICD-10-CM | POA: Insufficient documentation

## 2019-04-22 DIAGNOSIS — I509 Heart failure, unspecified: Secondary | ICD-10-CM | POA: Insufficient documentation

## 2019-04-22 DIAGNOSIS — Z6841 Body Mass Index (BMI) 40.0 and over, adult: Secondary | ICD-10-CM | POA: Insufficient documentation

## 2019-04-22 DIAGNOSIS — G4710 Hypersomnia, unspecified: Secondary | ICD-10-CM | POA: Insufficient documentation

## 2019-04-22 DIAGNOSIS — R0683 Snoring: Secondary | ICD-10-CM

## 2019-04-22 DIAGNOSIS — G4730 Sleep apnea, unspecified: Secondary | ICD-10-CM

## 2019-04-25 DIAGNOSIS — G4733 Obstructive sleep apnea (adult) (pediatric): Secondary | ICD-10-CM | POA: Diagnosis not present

## 2019-04-25 NOTE — Procedures (Signed)
   NAME: Stephen Maldonado DATE OF BIRTH:  [birth year] MEDICAL RECORD NUMBER [id number]  LOCATION: [facility]  PHYSICIAN: Marius Ditch  DATE OF STUDY: 04/22/2019  SLEEP STUDY TYPE: Positive Airway Pressure Titration               REFERRING PHYSICIAN: Marius Ditch, MD  INDICATION FOR STUDY: Severe OSA with severe hypoxemia noted on HSAT. Over 97% of night with O2 sat < 89%. He has CHF with an EF of < 40% and is morbidly obese.   EPWORTH SLEEPINESS SCORE:  NA HEIGHT: 5\' 11"  (180.3 cm)  WEIGHT: (!) 330 lb (149.7 kg)    Body mass index is 46.03 kg/m.  NECK SIZE: 20 in.  MEDICATIONS Patient self administered medications include: N/A. Medications administered during study include No sleep medicine administered.Marland Kitchen  SLEEP STUDY TECHNIQUE The patient underwent an attended overnight polysomnography titration to assess the effects of cpap therapy. The following variables were monitored: EEG (C4-A1, C3-A2, O1-A2, O2-A1, F3-M2, F4-M1), EOG, submental and leg EMG, ECG, oxyhemoglobin saturation by pulse oximetry, thoracic and abdominal respiratory effort belts, nasal/oral airflow by pressure sensor, body position sensor and snoring sensor. CPAP pressure was titrated to eliminate apneas, hypopneas and oxygen desaturation.  TECHNICAL COMMENTS Comments added by Technician: O2 initiated due to low sats. Comments added by Scorer: N/A  SLEEP ARCHITECTURE The study was initiated at 10:02:08 PM and terminated at 5:25:11 AM. Total recorded time was 443 minutes. EEG confirmed total sleep time was 427.5 minutes yielding a sleep efficiency of 96.5%%. Sleep onset after lights out was 10.6 minutes with a REM latency of 62.5 minutes. The patient spent 1.8%% of the night in stage N1 sleep, 51.0%% in stage N2 sleep, 0.0%% in stage N3 and 47.3% in REM. The Arousal Index was 5.2/hour.  RESPIRATORY PARAMETERS The overall AHI was 26.9 per hour, and the RDI was 28.6 events/hour with a central  apnea index of 4.2per hour. The most appropriate setting of BiPAP was IPAP/EPAP 26/22 cm H2O. Oxygen at 2LPM was added due to continuing hypoxemia. At this setting and with oxygen, the sleep efficiency was 99 % and the patient was supine for 100%. The AHI was 3.1 events per hour, and the RDI was 3.1 events/hour (with 4.2 central events) and the arousal index was 1.6 per hour. The oxygen nadir was 91.0% during sleep.  LEG MOVEMENT DATA The total leg movements were 0 with a resulting leg movement index of 0.0. Associated arousal with leg movement index was 0.0.  CARDIAC DATA The underlying cardiac rhythm was most consistent with sinus rhythm. Mean heart rate during sleep was 74.8 bpm. Additional rhythm abnormalities include None.  IMPRESSIONS - Successful BPAP titration to 26/22 with need for supplemental oxygen at 2 LPM. - Severe Oxygen Desaturation - Moderate Obstructive Sleep apnea(OSA) Optimal pressure attained. - Evidence of REM rebound with 47% of the night being in REM sleep.  DIAGNOSIS - Obstructive Sleep Apnea (327.23 [G47.33 ICD-10])  RECOMMENDATIONS - Trial of BiPAP therapy on 26/22 cm H2O with a Medium size Fisher&Paykel Full Face Mask Simplus mask and heated humidification. - Needs supplement oxygen at 2 LPM during sleep BPAP.  Marius Ditch Sleep specialist,  [place] [employer]  ELECTRONICALLY SIGNED ON:  04/25/2019, 9:20 PM [place] PH: [phone]   FX: [phone] [place]

## 2019-04-28 ENCOUNTER — Encounter: Payer: Self-pay | Admitting: Orthopaedic Surgery

## 2019-04-28 ENCOUNTER — Ambulatory Visit (INDEPENDENT_AMBULATORY_CARE_PROVIDER_SITE_OTHER): Payer: BC Managed Care – PPO | Admitting: Orthopaedic Surgery

## 2019-04-28 ENCOUNTER — Other Ambulatory Visit: Payer: Self-pay

## 2019-04-28 DIAGNOSIS — M5416 Radiculopathy, lumbar region: Secondary | ICD-10-CM

## 2019-04-28 NOTE — Progress Notes (Signed)
The patient is following up with severe low back pain.  This is also been chronic.  A MRI done in November of last year did show a chronic disc at L5-S1 and it was progressing and irritating the S1 nerve root.  We have sent him for back injections with ESI's through Dr. Ernestina Patches.  He states that it did not help him at all.  The complicating features of Stephen Maldonado is that he is morbidly obese and does have a significant heart issues.  He states from here he is going to get a BiPAP machine today.  His BMI is 46.  He is on Eliquis as well.  He has weeping peripheral edema in his bilateral lower extremities on exam today as well.  At this point I do not even feel that a surgical referral is warranted because I do not think he would medically be cleared for surgery.  Given his multitude of medical issues I feel that a referral to a pain management specialist is warranted at this standpoint.  He agrees with this as well.  We will work on making this referral for him.  All questions and concerns were answered and addressed.  Follow-up with [place] is as needed.

## 2019-05-04 ENCOUNTER — Encounter: Payer: Self-pay | Admitting: Physical Medicine and Rehabilitation

## 2019-05-07 NOTE — Procedures (Signed)
S1 Lumbosacral Transforaminal Epidural Steroid Injection - Sub-Pedicular Approach with Fluoroscopic Guidance   Patient: NICKOLUS DAKER      Date of Birth: [birth year] MRN: [id number] PCP: Josetta Huddle, MD      Visit Date: 04/13/2019   Universal Protocol:    Date/Time: 04/02/216:32 AM  Consent Given By: the patient  Position:  PRONE  Additional Comments: Vital signs were monitored before and after the procedure. Patient was prepped and draped in the usual sterile fashion. The correct patient, procedure, and site was verified.   Injection Procedure Details:  Procedure Site One Meds Administered:  Meds ordered this encounter  Medications  . methylPREDNISolone acetate (DEPO-MEDROL) injection 40 mg    Laterality: Right  Location/Site:  S1 Foramen   Needle size: 22 ga.  Needle type: Spinal  Needle Placement: Transforaminal  Findings:   -Comments: Excellent flow of contrast along the nerve and into the epidural space.  Epidurogram: Contrast epidurogram showed no nerve root cut off or restricted flow pattern.  Procedure Details: After squaring off the sacral end-plate to get a true AP view, the C-arm was positioned so that the best possible view of the S1 foramen was visualized. The soft tissues overlying this structure were infiltrated with 2-3 ml. of 1% Lidocaine without Epinephrine.    The spinal needle was inserted toward the target using a "trajectory" view along the fluoroscope beam.  Under AP and lateral visualization, the needle was advanced so it did not puncture dura. Biplanar projections were used to confirm position. Aspiration was confirmed to be negative for CSF and/or blood. A 1-2 ml. volume of Isovue-250 was injected and flow of contrast was noted at each level. Radiographs were obtained for documentation purposes.   After attaining the desired flow of contrast documented above, a 0.5 to 1.0 ml test dose of 0.25% Marcaine was injected into each respective  transforaminal space.  The patient was observed for 90 seconds post injection.  After no sensory deficits were reported, and normal lower extremity motor function was noted,   the above injectate was administered so that equal amounts of the injectate were placed at each foramen (level) into the transforaminal epidural space.   Additional Comments:  The patient tolerated the procedure well Dressing: Band-Aid with 2 x 2 sterile gauze    Post-procedure details: Patient was observed during the procedure. Post-procedure instructions were reviewed.  Patient left the clinic in stable condition.

## 2019-05-07 NOTE — Progress Notes (Signed)
Stephen Maldonado - 64 y.o. male MRN [id number]  Date of birth: 09-Oct-1955  Office Visit Note: Visit Date: 04/13/2019 PCP: Josetta Huddle, MD Referred by: Josetta Huddle, MD  Subjective: Chief Complaint  Patient presents with   Lower Back - Pain   Right Thigh - Pain   HPI:  Stephen Maldonado is a 64 y.o. male who comes in today At the request of Dr. [name] for diagnostic and hopefully therapeutic right S1 transforaminal epidural steroid injection.  Patient is having chronic worsening severe right radicular leg pain in an L5-S1 distribution.  MRI shows mainly small central disc protrusion without compression.  Epidurogram would be performed to look at contrast flow pattern.  ROS Otherwise per HPI.  Assessment & Plan: Visit Diagnoses:  1. Lumbar radiculopathy   2. Radiculopathy due to lumbar intervertebral disc disorder     Plan: No additional findings.   Meds & Orders:  Meds ordered this encounter  Medications   methylPREDNISolone acetate (DEPO-MEDROL) injection 40 mg    Orders Placed This Encounter  Procedures   XR C-ARM NO REPORT   Epidural Steroid injection    Follow-up: Return in about 2 weeks (around 04/27/2019) for [name], MD or Benita Stabile, PA-C.   Procedures: No procedures performed  S1 Lumbosacral Transforaminal Epidural Steroid Injection - Sub-Pedicular Approach with Fluoroscopic Guidance   Patient: Stephen Maldonado      Date of Birth: August 19, 1955 MRN: [id number] PCP: Josetta Huddle, MD      Visit Date: 04/13/2019   Universal Protocol:    Date/Time: 04/02/216:32 AM  Consent Given By: the patient  Position:  PRONE  Additional Comments: Vital signs were monitored before and after the procedure. Patient was prepped and draped in the usual sterile fashion. The correct patient, procedure, and site was verified.   Injection Procedure Details:  Procedure Site One Meds Administered:  Meds ordered this encounter  Medications    methylPREDNISolone acetate (DEPO-MEDROL) injection 40 mg    Laterality: Right  Location/Site:  S1 Foramen   Needle size: 22 ga.  Needle type: Spinal  Needle Placement: Transforaminal  Findings:   -Comments: Excellent flow of contrast along the nerve and into the epidural space.  Epidurogram: Contrast epidurogram showed no nerve root cut off or restricted flow pattern.  Procedure Details: After squaring off the sacral end-plate to get a true AP view, the C-arm was positioned so that the best possible view of the S1 foramen was visualized. The soft tissues overlying this structure were infiltrated with 2-3 ml. of 1% Lidocaine without Epinephrine.    The spinal needle was inserted toward the target using a "trajectory" view along the fluoroscope beam.  Under AP and lateral visualization, the needle was advanced so it did not puncture dura. Biplanar projections were used to confirm position. Aspiration was confirmed to be negative for CSF and/or blood. A 1-2 ml. volume of Isovue-250 was injected and flow of contrast was noted at each level. Radiographs were obtained for documentation purposes.   After attaining the desired flow of contrast documented above, a 0.5 to 1.0 ml test dose of 0.25% Marcaine was injected into each respective transforaminal space.  The patient was observed for 90 seconds post injection.  After no sensory deficits were reported, and normal lower extremity motor function was noted,   the above injectate was administered so that equal amounts of the injectate were placed at each foramen (level) into the transforaminal epidural space.   Additional Comments:  The patient  tolerated the procedure well Dressing: Band-Aid with 2 x 2 sterile gauze    Post-procedure details: Patient was observed during the procedure. Post-procedure instructions were reviewed.  Patient left the clinic in stable condition.     Clinical History: MRI LUMBAR SPINE WITHOUT  CONTRAST  TECHNIQUE: Multiplanar, multisequence MR imaging of the lumbar spine was performed. No intravenous contrast was administered.  COMPARISON:  2018 MRI  FINDINGS: Segmentation: There are 5 non-rib bearing lumbar type vertebral bodies with the last intervertebral disc space labeled as L5-S1.  Alignment:  Normal  Vertebrae: The vertebral body heights are well maintained. No fracture, marrow edema,or pathologic marrow infiltration.  Conus medullaris and cauda equina: Conus extends to the T12-L1 level. Conus and cauda equina appear normal.  Paraspinal and other soft tissues: The paraspinal soft tissues and visualized retroperitoneal structures are unremarkable. The sacroiliac joints are intact.  Disc levels:  T12-L1:  No significant canal or neural foraminal narrowing.  L1-L2: Minimal broad-based disc bulge, however no significant canal or neural foraminal narrowing.  L2-L3:   No significant canal or neural foraminal narrowing.  L3-L4: Minimal broad-based disc bulge which causes mild bilateral neural foraminal narrowing.  L4-L5: Broad-based disc bulge with ligamentum flavum hypertrophy and facet arthrosis which causes mild bilateral neural foraminal narrowing. There is mild effacement anterior thecal sac.  L5-S1: Broad-based disc bulge with a tiny central disc protrusion which contacts the bilateral descending S1 nerve roots. There is moderate left and mild right neural foraminal narrowing.  IMPRESSION: Lumbar spine spondylosis most notable at L5-S1 with a tiny central disc protrusion which contacts the bilateral descending S1 nerve roots. There is moderate left neural foraminal narrowing. This is slightly progressed since prior exam.   Electronically Signed   By: Prudencio Pair M.D.   On: 12/23/2018 10:38     Objective:  VS:  HT:     WT:    BMI:      BP:(!) 153/82   HR:78bpm   TEMP: ( )   RESP:  Physical Exam  Ortho Exam Imaging: No  results found.

## 2019-05-17 ENCOUNTER — Encounter: Payer: Self-pay | Admitting: Physical Medicine and Rehabilitation

## 2019-05-17 ENCOUNTER — Other Ambulatory Visit: Payer: Self-pay

## 2019-05-17 ENCOUNTER — Encounter
Payer: BC Managed Care – PPO | Attending: Physical Medicine and Rehabilitation | Admitting: Physical Medicine and Rehabilitation

## 2019-05-17 VITALS — BP 154/94 | HR 80 | Temp 97.2°F | Ht 71.0 in | Wt 330.2 lb

## 2019-05-17 DIAGNOSIS — M5416 Radiculopathy, lumbar region: Secondary | ICD-10-CM | POA: Insufficient documentation

## 2019-05-17 DIAGNOSIS — Z5181 Encounter for therapeutic drug level monitoring: Secondary | ICD-10-CM | POA: Insufficient documentation

## 2019-05-17 DIAGNOSIS — Z79891 Long term (current) use of opiate analgesic: Secondary | ICD-10-CM | POA: Diagnosis not present

## 2019-05-17 DIAGNOSIS — G894 Chronic pain syndrome: Secondary | ICD-10-CM | POA: Diagnosis not present

## 2019-05-17 MED ORDER — DULOXETINE HCL 30 MG PO CPEP: 30.0000 mg | ORAL_CAPSULE | Freq: Every day | ORAL | 3 refills | Status: DC

## 2019-05-17 NOTE — Progress Notes (Signed)
Subjective:    Patient ID: Stephen Maldonado, male    DOB: 04-18-1955, 64 y.o.   MRN: [id number]  HPI  Pt is a 64 yr old male with hx of CAD on Eliquis, obesity,  Here for evaluation of lumbar radiculopathy and chronic back pain.  Epidural steroid injections didn't help at all- at all!  X of B/L knee replacements- 5+ years ago and R shoulder replacement- shoulder 2 years ago  Had back pain for 10+ years.  Gotten worse because knee pain- started gaining weight with knees.  The worst part of pain- depends on what he's doing. Has to lean, not stand long. Washing dishes or vacuuming- can't weed-eat- can still lift OK, but canot walk with weight in arms.   Right in R lumbar paraspinals.  Sometimes will have sciatica down R leg- just sometimes- . Really hurts when taking a shower- R foot will go to sleep. Just standing in shower.    Usually just dull ache in low back.  Can't walk far- Barely got here from first row of parking deck.   Tried: Flexeril for muscle spasms was having- 2 pills total-  Don't feel like having muscle spasms currently.  Never tried Gabapentin, Duloxetine/Lyrica.  First time he's had Dilaudid- and first time pain has been better Has tried Norco/Oxycodone in past- didn't help at all- didn't "touch it".   Has taken the Dilaudid 2-3/week.     Didn't think was appropriate for surgery-  On Eliquis as well.     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  Not a smoker-  Some weeks none; some weeks a few every day- few beers- 12 oz.    Pain Inventory Average Pain 7 Pain Right Now 8 My pain is constant, sharp, dull, tingling, aching and grinding  In the last 24 hours, has pain interfered with the following? General activity 8 Relation with others 6 Enjoyment of life 10 What TIME of day is your pain at its worst? daytime Sleep (in general) Poor  Pain is worse with: walking, bending, standing and some activites Pain improves with: rest and medication  Relief from Meds: 2  Mobility walk without assistance how many minutes can you walk? 1-2 ability to climb steps?  yes do you drive?  yes  Function retired I need assistance with the following:  household duties  Neuro/Psych numbness tingling trouble walking spasms dizziness  Prior Studies Any changes since last visit?  no  Physicians involved in your care Any changes since last visit?  no Orthopedist  [place]   Family History  Problem Relation Age of Onset  . CAD Mother   . Lung cancer Mother   . Breast cancer Mother   . Diabetes Mother   . Heart failure Father   . CAD Father   . Diabetes Father    Social History   Socioeconomic History  . Marital status: Married    Spouse name: Not on file  . Number of children: 1  . Years of education: Not on file  . Highest education level: Not on file  Occupational History  . Not on file  Tobacco Use  . Smoking status: Never Smoker  . Smokeless tobacco: Former Systems developer    Types: Chew  Substance and Sexual Activity  . Alcohol use: Yes    Comment: 1-2 beers days  . Drug use: No  . Sexual activity: Not Currently  Other Topics Concern  . Not on file  Social History Narrative  .  Not on file   Social Determinants of Health   Financial Resource Strain:   . Difficulty of Paying Living Expenses:   Food Insecurity:   . Worried About Charity fundraiser in the Last Year:   . Arboriculturist in the Last Year:   Transportation Needs:   . Film/video editor (Medical):   Marland Kitchen Lack of Transportation (Non-Medical):   Physical Activity:   . Days of Exercise per Week:   . Minutes of Exercise per Session:   Stress:   . Feeling of Stress :   Social Connections:   . Frequency of Communication with Friends and Family:   . Frequency of Social Gatherings with Friends and Family:   . Attends Religious Services:   . Active Member of Clubs or Organizations:   . Attends Archivist Meetings:   Marland Kitchen Marital Status:     Past Surgical History:  Procedure Laterality Date  . CARDIOVERSION N/A 08/14/2018   Procedure: CARDIOVERSION;  Surgeon: Nigel Mormon, MD;  Location: [facility];  Service: Cardiovascular;  Laterality: N/A;  . COLONOSCOPY  06/14/2010  . CORONARY ANGIOGRAPHY N/A 07/11/2016   Procedure: Coronary Angiography;  Surgeon: Dixie Dials, MD;  Location: [facility];  Service: Cardiovascular;  Laterality: N/A;  . CYSTOSCOPY W/ RETROGRADES Bilateral 10/03/2015   Procedure: CYSTOSCOPY WITH RETROGRADE PYELOGRAM;  Surgeon: [name], MD;  Location: [facility];  Service: Urology;  Laterality: Bilateral;  . GREEN LIGHT LASER TURP (TRANSURETHRAL RESECTION OF PROSTATE N/A 10/03/2015   Procedure: GREEN LIGHT LASER,  TURP - STAGED(TRANSURETHRAL RESECTION OF PROSTATE;  Surgeon: [name], MD;  Location: [facility];  Service: Urology;  Laterality: N/A;  . INTRAVASCULAR PRESSURE WIRE/FFR STUDY N/A 07/14/2018   Procedure: INTRAVASCULAR PRESSURE WIRE/FFR STUDY;  Surgeon: Nigel Mormon, MD;  Location: [facility];  Service: Cardiovascular;  Laterality: N/A;  . KNEE ARTHROSCOPY     not sure which knee  . LEFT HEART CATH AND CORONARY ANGIOGRAPHY N/A 07/14/2018   Procedure: LEFT HEART CATH AND CORONARY ANGIOGRAPHY;  Surgeon: Nigel Mormon, MD;  Location: [facility];  Service: Cardiovascular;  Laterality: N/A;  . QUADRICEPS TENDON REPAIR Bilateral left 02/24/2003/   right 2006  . TOTAL KNEE ARTHROPLASTY Right 12/01/2015   Procedure: RIGHT TOTAL KNEE ARTHROPLASTY;  Surgeon: [name], MD;  Location: [facility];  Service: Orthopedics;  Laterality: Right;  . TOTAL KNEE ARTHROPLASTY Left 01/10/2017   Procedure: LEFT TOTAL KNEE ARTHROPLASTY;  Surgeon: [name], MD;  Location: [facility];  Service: Orthopedics;  Laterality: Left;  . TOTAL SHOULDER ARTHROPLASTY Right 01/23/2018   Procedure: RIGHT REVERSE TOTAL SHOULDER  ARTHROPLASTY;  Surgeon: Meredith Pel, MD;  Location: [facility];  Service: Orthopedics;  Laterality: Right;   Past Medical History:  Diagnosis Date  . Arthritis    knees  . Bladder spasms   . BPH (benign prostatic hyperplasia)   . DVT (deep venous thrombosis) ([place]) ocyt 2016   left leg tx medically  . GERD (gastroesophageal reflux disease)   . History of colon polyps    hyperplastic 06-14-2010  . Hydronephrosis   . Hypertension   . Obesity, morbid ([place])   . Urinary retention    BP (!) 154/94   Pulse 80   Temp (!) 97.2 F (36.2 C)   Ht 5\' 11"  (1.803 m)   Wt (!) 330 lb 3.2 oz (149.8 kg)   SpO2 (!) 86% Comment: had a  sleep study waiting on CPAP  BMI 46.05 kg/m   Opioid Risk Score:   Fall Risk Score:  `1  Depression screen PHQ 2/9  Depression screen PHQ 2/9 05/17/2019  Decreased Interest 1  Down, Depressed, Hopeless 0  PHQ - 2 Score 1  Altered sleeping 3  Tired, decreased energy 3  Change in appetite 0  Feeling bad or failure about yourself  0  Trouble concentrating 0  Moving slowly or fidgety/restless 0  Suicidal thoughts 0  PHQ-9 Score 7  Difficult doing work/chores Somewhat difficult    Review of Systems  Constitutional: Positive for unexpected weight change.       Gain  HENT: Negative.   Eyes: Negative.   Respiratory: Positive for apnea, cough, shortness of breath and wheezing.   Cardiovascular: Negative.   Gastrointestinal: Negative.   Endocrine: Negative.   Musculoskeletal: Positive for back pain and gait problem.       Spasms  Skin: Negative.   Allergic/Immunologic: Negative.   Neurological: Positive for dizziness and numbness.       Tingling  Hematological: Bruises/bleeds easily.       Eliquis  Psychiatric/Behavioral: Negative.   All other systems reviewed and are negative.      Objective:   Physical Exam Awake, alert, appropriate, sitting on bed; very painful to stand up- had to rock to stand up;  Venous stasis changes on LEs to knees B/L  Legs draining- esp R leg from venous changes and scab MS: HF, KE, KF, DF and PF 5/5 in LEs B/L TTP over R paraspinals on lumbar area Can flex lumbar spine with stretching feeling Extension causes increased pain.     Neuro: Intact sensation to light touch B/L in L1- S1 dermatomes    MRI lumbar 12/22/2018 Lumbar spine spondylosis most notable at L5-S1 with a tiny central disc protrusion which contacts the bilateral descending S1 nerve roots. There is moderate left neural foraminal narrowing. This is slightly progressed since prior exam.        Assessment & Plan:    Pt is a 64 yr old male with hx of CAD on Eliquis, obesity, and CKD Stage III Here for evaluation of lumbar radiculopathy and chronic back pain. 1.  Scared to take much pain meds- blocks him up- Take miralax- 1/2 to 1 full dose based on how any doses of pain pill- suggest to take at same time or right before takes pain pill.   2. Duloxetine 2.  Duloxetine /Cymbalta 30 mg an AM x 1 week-   Then 60 mg nightly- for nerve pain  1% of patients can have nausea with Duloxetine- call me if needs an anti-nausea medicine. Can also cause mild dry mouth/dry eyes and mild constipation.  3. Once drug screen, back- within 6-7 days, happy to prescribe the Dilaudid 4 mg as needed.   4. F/U in 6 weeks.   I spent a total of 50 minutes on appointment- more than 30 minutes discussing plan as above

## 2019-05-17 NOTE — Patient Instructions (Signed)
  Pt is a 64 yr old male with hx of CAD on Eliquis, obesity, and CKD Stage III Here for evaluation of lumbar radiculopathy and chronic back pain. 1.  Scared to take much pain meds- blocks him up- Take miralax- 1/2 to 1 full dose based on how any doses of pain pill- suggest to take at same time or right before takes pain pill.   2. Duloxetine 2.  Duloxetine /Cymbalta 30 mg an AM x 1 week-   Then 60 mg nightly- for nerve pain  1% of patients can have nausea with Duloxetine- call me if needs an anti-nausea medicine. Can also cause mild dry mouth/dry eyes and mild constipation.  3. Once drug screen, back- within 6-7 days, happy to prescribe the Dilaudid 4 mg as needed.   4. F/U in 6 weeks.

## 2019-05-18 DIAGNOSIS — L03115 Cellulitis of right lower limb: Secondary | ICD-10-CM | POA: Diagnosis not present

## 2019-05-21 LAB — DRUG TOX MONITOR 1 W/CONF, ORAL FLD

## 2019-05-21 LAB — DRUG TOX ALC METAB W/CON, ORAL FLD: Alcohol Metabolite: NEGATIVE ng/mL (ref <25)

## 2019-05-24 ENCOUNTER — Telehealth: Payer: Self-pay | Admitting: *Deleted

## 2019-05-24 NOTE — Telephone Encounter (Signed)
Mr Poirot reported last taking pain medication on the day before the test.  The test was negative for all medication. His last fill date for his hydromorphone was 04/05/19 #40 I q 6 hours as needed,  and the test was done on 05/17/19, so this may be considered consistent, since he could not have been taking medication on a scheduled basis.Marland Kitchen

## 2019-05-25 DIAGNOSIS — G4733 Obstructive sleep apnea (adult) (pediatric): Secondary | ICD-10-CM | POA: Diagnosis not present

## 2019-05-26 DIAGNOSIS — G4733 Obstructive sleep apnea (adult) (pediatric): Secondary | ICD-10-CM | POA: Diagnosis not present

## 2019-05-31 ENCOUNTER — Telehealth: Payer: Self-pay | Admitting: *Deleted

## 2019-05-31 MED ORDER — HYDROMORPHONE HCL 4 MG PO TABS: 4.0000 mg | ORAL_TABLET | Freq: Four times a day (QID) | ORAL | 0 refills | Status: DC | PRN

## 2019-05-31 NOTE — Telephone Encounter (Signed)
Patient left a message with a question concerning a medication. He referred to cephalexin.  I contacted the patient to gain clarification on what the patient was trying to say.  I informed that Dr. Dagoberto Ligas prescribed duloxetine and was willing to prescribe dilaudid as long as  drug screen came back consistent.  I reviewed medication list with patient.  He acknowledged that cephalexin came from his PCP. I reviewed his labs.  It appears drug screen is consistent.  Please review

## 2019-05-31 NOTE — Telephone Encounter (Signed)
Called pt- he was just calling to make sure I sent in [place]- which I did 4 mg #30

## 2019-06-22 DIAGNOSIS — I890 Lymphedema, not elsewhere classified: Secondary | ICD-10-CM | POA: Diagnosis not present

## 2019-06-22 DIAGNOSIS — I4891 Unspecified atrial fibrillation: Secondary | ICD-10-CM | POA: Diagnosis not present

## 2019-06-22 DIAGNOSIS — G4733 Obstructive sleep apnea (adult) (pediatric): Secondary | ICD-10-CM | POA: Diagnosis not present

## 2019-06-22 DIAGNOSIS — I509 Heart failure, unspecified: Secondary | ICD-10-CM | POA: Diagnosis not present

## 2019-06-24 DIAGNOSIS — G4733 Obstructive sleep apnea (adult) (pediatric): Secondary | ICD-10-CM | POA: Diagnosis not present

## 2019-06-25 DIAGNOSIS — G4733 Obstructive sleep apnea (adult) (pediatric): Secondary | ICD-10-CM | POA: Diagnosis not present

## 2019-06-30 ENCOUNTER — Encounter
Payer: BC Managed Care – PPO | Attending: Physical Medicine and Rehabilitation | Admitting: Physical Medicine and Rehabilitation

## 2019-06-30 DIAGNOSIS — Z5181 Encounter for therapeutic drug level monitoring: Secondary | ICD-10-CM | POA: Insufficient documentation

## 2019-06-30 DIAGNOSIS — Z79891 Long term (current) use of opiate analgesic: Secondary | ICD-10-CM | POA: Insufficient documentation

## 2019-06-30 DIAGNOSIS — M5416 Radiculopathy, lumbar region: Secondary | ICD-10-CM | POA: Insufficient documentation

## 2019-06-30 DIAGNOSIS — G894 Chronic pain syndrome: Secondary | ICD-10-CM | POA: Insufficient documentation

## 2019-07-07 ENCOUNTER — Other Ambulatory Visit: Payer: Self-pay | Admitting: Cardiology

## 2019-07-08 ENCOUNTER — Other Ambulatory Visit: Payer: Self-pay | Admitting: Physical Medicine and Rehabilitation

## 2019-07-25 DIAGNOSIS — G4733 Obstructive sleep apnea (adult) (pediatric): Secondary | ICD-10-CM | POA: Diagnosis not present

## 2019-07-26 DIAGNOSIS — G4733 Obstructive sleep apnea (adult) (pediatric): Secondary | ICD-10-CM | POA: Diagnosis not present

## 2019-07-30 ENCOUNTER — Other Ambulatory Visit: Payer: Self-pay

## 2019-07-30 ENCOUNTER — Encounter
Payer: BC Managed Care – PPO | Attending: Physical Medicine and Rehabilitation | Admitting: Physical Medicine and Rehabilitation

## 2019-07-30 ENCOUNTER — Encounter: Payer: Self-pay | Admitting: Physical Medicine and Rehabilitation

## 2019-07-30 VITALS — BP 110/79 | HR 79 | Temp 97.5°F | Ht 71.0 in | Wt 320.0 lb

## 2019-07-30 DIAGNOSIS — H9122 Sudden idiopathic hearing loss, left ear: Secondary | ICD-10-CM | POA: Diagnosis not present

## 2019-07-30 DIAGNOSIS — Z79891 Long term (current) use of opiate analgesic: Secondary | ICD-10-CM | POA: Insufficient documentation

## 2019-07-30 DIAGNOSIS — M5416 Radiculopathy, lumbar region: Secondary | ICD-10-CM | POA: Diagnosis not present

## 2019-07-30 DIAGNOSIS — Z5181 Encounter for therapeutic drug level monitoring: Secondary | ICD-10-CM | POA: Insufficient documentation

## 2019-07-30 DIAGNOSIS — G894 Chronic pain syndrome: Secondary | ICD-10-CM | POA: Insufficient documentation

## 2019-07-30 MED ORDER — GABAPENTIN 400 MG PO CAPS: 400.0000 mg | ORAL_CAPSULE | Freq: Every day | ORAL | 5 refills | Status: DC

## 2019-07-30 NOTE — Patient Instructions (Signed)
Pt is a 64 yr old male with hx of CAD on Eliquis, obesity, and CKD Stage III Here for evaluation of lumbar radiculopathy and chronic back pain. Deaf in L ear- in last 2 years.  1. Wean off Duloxetine-white/green pill every other 60 mg for 1 week, then can stop.   2. If find, when come off it, it was actually helping a little, can restart if want to.    3. Gabapentin 400 mg nightly x 3 days  400 mg in AM; 800 mg at night x 1 week   Then 800 mg 2x/day. For nerve pain  4. Will not renew Dilaudid/Hydromorphone since not working and all it does it constipate pt. Little tiny white pill-    5. Only hurts when stands for awhile per pt- so won't treat with trigger points yet/injection of muscle spasms of back    6. Side effects of Gabapentin- went over- slowing; no constipation; word finding issues;  Impaired balance.   7. Wants to think about it- think pt needs Brain MRI due to concern of acoustic neuroma. Due to L hearing loss.   At least needs referral to ENT/ears nose throat doctor   8. F/U in 2 months

## 2019-07-30 NOTE — Progress Notes (Signed)
Subjective:    Patient ID: Stephen Maldonado, male    DOB: 28-Aug-1955, 64 y.o.   MRN: [id number]  HPI Pt is a 64 yr old male with hx of CAD on Eliquis, obesity, and CKD Stage III Here for evaluation of lumbar radiculopathy and chronic back pain.  "whatever you gave me isn't working".  Is on Hydromorphone 4 mg -   Stopped taking it because wasn't working- just clogged him up/constipation.   Epidural steroid injections didn't help at all- verified this.   Never tried gabapentin or Lyrica.  Is taking Duloxetine- not making a difference     Pain Inventory Average Pain 7 Pain Right Now 8 My pain is constant, sharp, dull, tingling, aching and grinding  In the last 24 hours, has pain interfered with the following? General activity 8 Relation with others 6 Enjoyment of life 10 What TIME of day is your pain at its worst? daytime Sleep (in general) Poor  Pain is worse with: walking, bending, standing and some activites Pain improves with: rest and medication Relief from Meds: 2  Mobility walk without assistance ability to climb steps?  yes do you drive?  yes  Function retired  Neuro/Psych numbness tingling trouble walking spasms dizziness  Prior Studies Any changes since last visit?  no  Physicians involved in your care Any changes since last visit?  no   Family History  Problem Relation Age of Onset  . CAD Mother   . Lung cancer Mother   . Breast cancer Mother   . Diabetes Mother   . Heart failure Father   . CAD Father   . Diabetes Father    Social History   Socioeconomic History  . Marital status: Married    Spouse name: Not on file  . Number of children: 1  . Years of education: Not on file  . Highest education level: Not on file  Occupational History  . Not on file  Tobacco Use  . Smoking status: Never Smoker  . Smokeless tobacco: Former Systems developer    Types: Secondary school teacher  . Vaping Use: Never used  Substance and Sexual Activity  . Alcohol  use: Yes    Comment: 1-2 beers days  . Drug use: No  . Sexual activity: Not Currently  Other Topics Concern  . Not on file  Social History Narrative  . Not on file   Social Determinants of Health   Financial Resource Strain:   . Difficulty of Paying Living Expenses:   Food Insecurity:   . Worried About Charity fundraiser in the Last Year:   . Arboriculturist in the Last Year:   Transportation Needs:   . Film/video editor (Medical):   Marland Kitchen Lack of Transportation (Non-Medical):   Physical Activity:   . Days of Exercise per Week:   . Minutes of Exercise per Session:   Stress:   . Feeling of Stress :   Social Connections:   . Frequency of Communication with Friends and Family:   . Frequency of Social Gatherings with Friends and Family:   . Attends Religious Services:   . Active Member of Clubs or Organizations:   . Attends Archivist Meetings:   Marland Kitchen Marital Status:    Past Surgical History:  Procedure Laterality Date  . CARDIOVERSION N/A 08/14/2018   Procedure: CARDIOVERSION;  Surgeon: Nigel Mormon, MD;  Location: [facility];  Service: Cardiovascular;  Laterality: N/A;  . COLONOSCOPY  06/14/2010  .  CORONARY ANGIOGRAPHY N/A 07/11/2016   Procedure: Coronary Angiography;  Surgeon: Dixie Dials, MD;  Location: [place] [facility];  Service: Cardiovascular;  Laterality: N/A;  . CYSTOSCOPY W/ RETROGRADES Bilateral 10/03/2015   Procedure: CYSTOSCOPY WITH RETROGRADE PYELOGRAM;  Surgeon: [name], MD;  Location: [facility];  Service: Urology;  Laterality: Bilateral;  . GREEN LIGHT LASER TURP (TRANSURETHRAL RESECTION OF PROSTATE N/A 10/03/2015   Procedure: GREEN LIGHT LASER,  TURP - STAGED(TRANSURETHRAL RESECTION OF PROSTATE;  Surgeon: [name], MD;  Location: [facility];  Service: Urology;  Laterality: N/A;  . INTRAVASCULAR PRESSURE WIRE/FFR STUDY N/A 07/14/2018   Procedure: INTRAVASCULAR PRESSURE WIRE/FFR STUDY;  Surgeon:  Nigel Mormon, MD;  Location: [place] [facility];  Service: Cardiovascular;  Laterality: N/A;  . KNEE ARTHROSCOPY     not sure which knee  . LEFT HEART CATH AND CORONARY ANGIOGRAPHY N/A 07/14/2018   Procedure: LEFT HEART CATH AND CORONARY ANGIOGRAPHY;  Surgeon: Nigel Mormon, MD;  Location: [place] [facility];  Service: Cardiovascular;  Laterality: N/A;  . QUADRICEPS TENDON REPAIR Bilateral left 02/24/2003/   right 2006  . TOTAL KNEE ARTHROPLASTY Right 12/01/2015   Procedure: RIGHT TOTAL KNEE ARTHROPLASTY;  Surgeon: [name], MD;  Location: [facility];  Service: Orthopedics;  Laterality: Right;  . TOTAL KNEE ARTHROPLASTY Left 01/10/2017   Procedure: LEFT TOTAL KNEE ARTHROPLASTY;  Surgeon: [name], MD;  Location: [facility];  Service: Orthopedics;  Laterality: Left;  . TOTAL SHOULDER ARTHROPLASTY Right 01/23/2018   Procedure: RIGHT REVERSE TOTAL SHOULDER ARTHROPLASTY;  Surgeon: Meredith Pel, MD;  Location: [place];  Service: Orthopedics;  Laterality: Right;   Past Medical History:  Diagnosis Date  . Arthritis    knees  . Bladder spasms   . BPH (benign prostatic hyperplasia)   . DVT (deep venous thrombosis) ([place]) ocyt 2016   left leg tx medically  . GERD (gastroesophageal reflux disease)   . History of colon polyps    hyperplastic 06-14-2010  . Hydronephrosis   . Hypertension   . Obesity, morbid ([place])   . Urinary retention    BP 110/79   Pulse 79   Temp (!) 97.5 F (36.4 C)   Ht 5\' 11"  (1.803 m)   Wt (!) 320 lb (145.2 kg)   SpO2 96%   BMI 44.63 kg/m   Opioid Risk Score:   Fall Risk Score:  `1  Depression screen PHQ 2/9  Depression screen PHQ 2/9 05/17/2019  Decreased Interest 1  Down, Depressed, Hopeless 0  PHQ - 2 Score 1  Altered sleeping 3  Tired, decreased energy 3  Change in appetite 0  Feeling bad or failure about yourself  0  Trouble concentrating 0  Moving slowly or fidgety/restless 0  Suicidal thoughts 0  PHQ-9 Score 7   Difficult doing work/chores Somewhat difficult    Review of Systems  Constitutional: Negative.   HENT: Negative.   Eyes: Negative.   Respiratory: Negative.   Cardiovascular: Negative.   Gastrointestinal: Negative.   Endocrine: Negative.   Genitourinary: Positive for scrotal swelling.  Musculoskeletal: Positive for back pain, gait problem and neck pain.  Skin: Negative.   Neurological: Positive for dizziness and numbness.       Tingling  Hematological: Negative.   Psychiatric/Behavioral: Negative.   All other systems reviewed and are negative.      Objective:   Physical Exam  Awake, alert, appropriate, sitting on table, NAD Palpable trigger point size of large grape  on R lumbar paraspinals TTP around that area No hearing on L side      Assessment & Plan:  Pt is a 64 yr old male with hx of CAD on Eliquis, obesity, and CKD Stage III Here for evaluation of lumbar radiculopathy and chronic back pain. Deaf in L ear- in last 2 years.  1. Wean off Duloxetine- eery other 60 mg for 1 week, then can stop.   2. If find, when come off it, it was actually helping a little, can restart if want to.    3. Gabapentin 400 mg nightly x 3 days  400 mg in AM; 800 mg at night x 1 week   Then 800 mg 2x/day. For nerve pain  4. Will not renew Dilaudid/Hydromorphone since not working and all it does it constipate pt.   5. Only hurts when stands for awhile   6. Side effects of Gabapentin- went over- slowing; no constipation; word finding issues;  Impaired balance.   7. Wants to think about it- think pt needs Brain MRI due to concern of acoustic neuroma. Due to L hearing loss.   At least needs referral to ENT/ears nose throat doctor   8. F/U in 2 months   I spent a total of 30 minutes on visit. As detailed above.

## 2019-08-18 ENCOUNTER — Telehealth: Payer: Self-pay

## 2019-08-18 NOTE — Telephone Encounter (Signed)
Patient aware that Dr. Dagoberto Ligas is out of the office:   Stephen Maldonado stated he can not take the Rx Gabapentin. It affects his memory &  gives leaves him with  hand spasms. Where he cannot control his grip. Is there another Rx option?   Call back ph# [phone].  Thank you.

## 2019-08-23 NOTE — Telephone Encounter (Signed)
Attempted to call voice mail was not set up - called 4:24 pm on 7/19- will call again tomorrow

## 2019-08-25 DIAGNOSIS — G4733 Obstructive sleep apnea (adult) (pediatric): Secondary | ICD-10-CM | POA: Diagnosis not present

## 2019-08-27 ENCOUNTER — Other Ambulatory Visit: Payer: Self-pay | Admitting: Physical Medicine and Rehabilitation

## 2019-08-27 ENCOUNTER — Telehealth: Payer: Self-pay | Admitting: *Deleted

## 2019-08-27 DIAGNOSIS — H9122 Sudden idiopathic hearing loss, left ear: Secondary | ICD-10-CM

## 2019-08-27 MED ORDER — PREGABALIN 50 MG PO CAPS: 50.0000 mg | ORAL_CAPSULE | Freq: Two times a day (BID) | ORAL | 1 refills | Status: DC

## 2019-08-27 NOTE — Telephone Encounter (Signed)
Mr Vu requests a call from Dr Dagoberto Ligas.  His medication is not working. # [phone]

## 2019-08-27 NOTE — Telephone Encounter (Signed)
Will wean gabapentin took for a couple weeks- and then quit taking .  Lyrica- 50 mg BID x 4-7 days then 100 mg BID x 4-7 days then 150 mg BID- will titrate up more at f/u.  Needs to take 3-4 weeks- to know if it works.

## 2019-09-11 DIAGNOSIS — Z23 Encounter for immunization: Secondary | ICD-10-CM | POA: Diagnosis not present

## 2019-09-24 ENCOUNTER — Encounter
Payer: BC Managed Care – PPO | Attending: Physical Medicine and Rehabilitation | Admitting: Physical Medicine and Rehabilitation

## 2019-09-24 ENCOUNTER — Encounter: Payer: Self-pay | Admitting: Physical Medicine and Rehabilitation

## 2019-09-24 ENCOUNTER — Other Ambulatory Visit: Payer: Self-pay

## 2019-09-24 VITALS — BP 109/70 | HR 70 | Temp 98.1°F | Ht 71.0 in | Wt 324.0 lb

## 2019-09-24 DIAGNOSIS — Z5181 Encounter for therapeutic drug level monitoring: Secondary | ICD-10-CM | POA: Diagnosis not present

## 2019-09-24 DIAGNOSIS — M48062 Spinal stenosis, lumbar region with neurogenic claudication: Secondary | ICD-10-CM | POA: Diagnosis not present

## 2019-09-24 DIAGNOSIS — Z79891 Long term (current) use of opiate analgesic: Secondary | ICD-10-CM | POA: Insufficient documentation

## 2019-09-24 DIAGNOSIS — M5416 Radiculopathy, lumbar region: Secondary | ICD-10-CM | POA: Diagnosis not present

## 2019-09-24 DIAGNOSIS — G894 Chronic pain syndrome: Secondary | ICD-10-CM

## 2019-09-24 MED ORDER — METHADONE HCL 5 MG PO TABS: 5.0000 mg | ORAL_TABLET | Freq: Two times a day (BID) | ORAL | 0 refills | Status: DC

## 2019-09-24 NOTE — Patient Instructions (Signed)
  Pt is a 64 yr old male with hx of CAD on Eliquis, obesity, and CKD Stage III Here for evaluation of lumbar radiculopathy and chronic back pain. Deaf in L ear- in last 2 years.   1. Suggest miralax- stay on top of that.  To keep him without constipation.   2. Methadone- 5 mg PO BID- for nerve pain and control of traditional pain- think has neurogenic claudication.   3.  Might need epidural steroid injection still-  Might need Nucynta as well.   4. F/U- call me in 5 days to make sure working well.  F/u appointment 1 months-

## 2019-09-24 NOTE — Progress Notes (Signed)
Subjective:    Patient ID: Stephen Maldonado, male    DOB: Oct 29, 1955, 64 y.o.   MRN: [id number]  HPI   Pt is a 64 yr old male with hx of CAD on Eliquis, obesity, and CKD Stage III Here for evaluation of lumbar radiculopathy and chronic back pain. Deaf in L ear- in last 2 years.     Fingers swollen- like sausages Muscles twitched like crazy.  Bad dreams/nightmares.   Stopped Lyrica 1 week ago.   So, didn't help at [facility]!!!   Emergency in [place]- wife's parents needs assistance- put mother in nursing home- her dad is 66- didn't have reception- in [place] have ENT appointment yet.   Didn't help - 1x- epidural steroid injections.    Pain Inventory Average Pain 10 Pain Right Now 10 My pain is sharp, dull, stabbing and aching  In the last 24 hours, has pain interfered with the following? General activity 8 Relation with others 10 Enjoyment of life 10 What TIME of day is your pain at its worst? morning , daytime, evening and night Sleep (in general) Good  Pain is worse with: walking, bending, sitting and standing Pain improves with: none Relief from Meds: 0  Family History  Problem Relation Age of Onset  . CAD Mother   . Lung cancer Mother   . Breast cancer Mother   . Diabetes Mother   . Heart failure Father   . CAD Father   . Diabetes Father    Social History   Socioeconomic History  . Marital status: Married    Spouse name: Not on file  . Number of children: 1  . Years of education: Not on file  . Highest education level: Not on file  Occupational History  . Not on file  Tobacco Use  . Smoking status: Never Smoker  . Smokeless tobacco: Former Systems developer    Types: Secondary school teacher  . Vaping Use: Never used  Substance and Sexual Activity  . Alcohol use: Yes    Comment: 1-2 beers days  . Drug use: No  . Sexual activity: Not Currently  Other Topics Concern  . Not on file  Social History Narrative  . Not on file   Social Determinants of Health    Financial Resource Strain:   . Difficulty of Paying Living Expenses: Not on file  Food Insecurity:   . Worried About Charity fundraiser in the Last Year: Not on file  . Ran Out of Food in the Last Year: Not on file  Transportation Needs:   . Lack of Transportation (Medical): Not on file  . Lack of Transportation (Non-Medical): Not on file  Physical Activity:   . Days of Exercise per Week: Not on file  . Minutes of Exercise per Session: Not on file  Stress:   . Feeling of Stress : Not on file  Social Connections:   . Frequency of Communication with Friends and Family: Not on file  . Frequency of Social Gatherings with Friends and Family: Not on file  . Attends Religious Services: Not on file  . Active Member of Clubs or Organizations: Not on file  . Attends Archivist Meetings: Not on file  . Marital Status: Not on file   Past Surgical History:  Procedure Laterality Date  . CARDIOVERSION N/A 08/14/2018   Procedure: CARDIOVERSION;  Surgeon: Nigel Mormon, MD;  Location: [facility];  Service: Cardiovascular;  Laterality: N/A;  . COLONOSCOPY  06/14/2010  . CORONARY ANGIOGRAPHY N/A 07/11/2016   Procedure: Coronary Angiography;  Surgeon: Dixie Dials, MD;  Location: [place] [facility];  Service: Cardiovascular;  Laterality: N/A;  . CYSTOSCOPY W/ RETROGRADES Bilateral 10/03/2015   Procedure: CYSTOSCOPY WITH RETROGRADE PYELOGRAM;  Surgeon: [name], MD;  Location: [facility];  Service: Urology;  Laterality: Bilateral;  . GREEN LIGHT LASER TURP (TRANSURETHRAL RESECTION OF PROSTATE N/A 10/03/2015   Procedure: GREEN LIGHT LASER,  TURP - STAGED(TRANSURETHRAL RESECTION OF PROSTATE;  Surgeon: [name], MD;  Location: [facility];  Service: Urology;  Laterality: N/A;  . INTRAVASCULAR PRESSURE WIRE/FFR STUDY N/A 07/14/2018   Procedure: INTRAVASCULAR PRESSURE WIRE/FFR STUDY;  Surgeon: Nigel Mormon, MD;  Location: [place] [facility];  Service: Cardiovascular;  Laterality: N/A;  . KNEE ARTHROSCOPY     not sure which knee  . LEFT HEART CATH AND CORONARY ANGIOGRAPHY N/A 07/14/2018   Procedure: LEFT HEART CATH AND CORONARY ANGIOGRAPHY;  Surgeon: Nigel Mormon, MD;  Location: [place] [facility];  Service: Cardiovascular;  Laterality: N/A;  . QUADRICEPS TENDON REPAIR Bilateral left 02/24/2003/   right 2006  . TOTAL KNEE ARTHROPLASTY Right 12/01/2015   Procedure: RIGHT TOTAL KNEE ARTHROPLASTY;  Surgeon: [name], MD;  [facility];  Service: Orthopedics;  Laterality: Right;  . TOTAL KNEE ARTHROPLASTY Left 01/10/2017   Procedure: LEFT TOTAL KNEE ARTHROPLASTY;  Surgeon: [name], MD;  [facility];  Service: Orthopedics;  Laterality: Left;  . TOTAL SHOULDER ARTHROPLASTY Right 01/23/2018   Procedure: RIGHT REVERSE TOTAL SHOULDER ARTHROPLASTY;  Surgeon: Meredith Pel, MD;  Location: [place];  Service: Orthopedics;  Laterality: Right;   Past Surgical History:  Procedure Laterality Date  . CARDIOVERSION N/A 08/14/2018   Procedure: CARDIOVERSION;  Surgeon: Nigel Mormon, MD;  Location: [place] [facility];  Service: Cardiovascular;  Laterality: N/A;  . COLONOSCOPY  06/14/2010  . CORONARY ANGIOGRAPHY N/A 07/11/2016   Procedure: Coronary Angiography;  Surgeon: Dixie Dials, MD;  Location: [place] [facility];  Service: Cardiovascular;  Laterality: N/A;  . CYSTOSCOPY W/ RETROGRADES Bilateral 10/03/2015   Procedure: CYSTOSCOPY WITH RETROGRADE PYELOGRAM;  Surgeon: [name], MD;  Location: [facility];  Service: Urology;  Laterality: Bilateral;  . GREEN LIGHT LASER TURP (TRANSURETHRAL RESECTION OF PROSTATE N/A 10/03/2015   Procedure: GREEN LIGHT LASER,  TURP - STAGED(TRANSURETHRAL RESECTION OF PROSTATE;  Surgeon: [name], MD;  Location: [facility];  Service: Urology;  Laterality: N/A;  . INTRAVASCULAR PRESSURE WIRE/FFR STUDY N/A 07/14/2018    Procedure: INTRAVASCULAR PRESSURE WIRE/FFR STUDY;  Surgeon: Nigel Mormon, MD;  Location: [place] [facility];  Service: Cardiovascular;  Laterality: N/A;  . KNEE ARTHROSCOPY     not sure which knee  . LEFT HEART CATH AND CORONARY ANGIOGRAPHY N/A 07/14/2018   Procedure: LEFT HEART CATH AND CORONARY ANGIOGRAPHY;  Surgeon: Nigel Mormon, MD;  Location: [place] [facility];  Service: Cardiovascular;  Laterality: N/A;  . QUADRICEPS TENDON REPAIR Bilateral left 02/24/2003/   right 2006  . TOTAL KNEE ARTHROPLASTY Right 12/01/2015   Procedure: RIGHT TOTAL KNEE ARTHROPLASTY;  Surgeon: [name], MD;  [facility];  Service: Orthopedics;  Laterality: Right;  . TOTAL KNEE ARTHROPLASTY Left 01/10/2017   Procedure: LEFT TOTAL KNEE ARTHROPLASTY;  Surgeon: [name], MD;  Location[facility];  Service: Orthopedics;  Laterality: Left;  . TOTAL SHOULDER ARTHROPLASTY Right 01/23/2018   Procedure: RIGHT REVERSE TOTAL SHOULDER ARTHROPLASTY;  Surgeon:  Meredith Pel, MD;  Location: [place];  Service: Orthopedics;  Laterality: Right;   Past Medical History:  Diagnosis Date  . Arthritis    knees  . Bladder spasms   . BPH (benign prostatic hyperplasia)   . DVT (deep venous thrombosis) ([place]) ocyt 2016   left leg tx medically  . GERD (gastroesophageal reflux disease)   . History of colon polyps    hyperplastic 06-14-2010  . Hydronephrosis   . Hypertension   . Obesity, morbid ([place])   . Urinary retention    BP 109/70   Pulse 70   Temp 98.1 F (36.7 C)   Ht 5\' 11"  (1.803 m)   Wt (!) 324 lb (147 kg)   SpO2 93%   BMI 45.19 kg/m   Opioid Risk Score:   Fall Risk Score:  `1  Depression screen PHQ 2/9  Depression screen [facility] 2/9 09/24/2019 05/17/2019  Decreased Interest 1 1  Down, Depressed, Hopeless 1 0  PHQ - 2 Score 2 1  Altered sleeping - 3  Tired, decreased energy - 3  Change in appetite - 0  Feeling bad or failure about yourself  - 0  Trouble concentrating  - 0  Moving slowly or fidgety/restless - 0  Suicidal thoughts - 0  PHQ-9 Score - 7  Difficult doing work/chores - Somewhat difficult    Review of Systems  Neurological: Positive for tremors, weakness and numbness.       Tingling   Psychiatric/Behavioral: Positive for dysphoric mood.  All other systems reviewed and are negative.      Objective:   Physical Exam  Near tears sitting on chair, NAD TTP right low back       Assessment & Plan:   Pt is a 64 yr old male with hx of CAD on Eliquis, obesity, and CKD Stage III Here for evaluation of lumbar radiculopathy and chronic back pain. Deaf in L ear- in last 2 years.   1. Suggest miralax- stay on top of that.  To keep him without constipation.   2. Methadone- 5 mg PO BID- for nerve pain and control of traditional pain- think has neurogenic claudication.   3.  Might need epidural steroid injection still-  Might need Nucynta as well.   4. F/U- call me in 5 days to make sure working well.  F/u appointment 1 months-   I spent a total of 25 minutes on visit- discussing options for pain.

## 2019-09-25 DIAGNOSIS — G4733 Obstructive sleep apnea (adult) (pediatric): Secondary | ICD-10-CM | POA: Diagnosis not present

## 2019-09-29 ENCOUNTER — Other Ambulatory Visit: Payer: Self-pay | Admitting: Physical Medicine and Rehabilitation

## 2019-09-29 MED ORDER — METHADONE HCL 5 MG PO TABS: 5.0000 mg | ORAL_TABLET | Freq: Two times a day (BID) | ORAL | 0 refills | Status: DC

## 2019-10-05 DIAGNOSIS — G4733 Obstructive sleep apnea (adult) (pediatric): Secondary | ICD-10-CM | POA: Diagnosis not present

## 2019-10-25 ENCOUNTER — Encounter
Payer: BC Managed Care – PPO | Attending: Physical Medicine and Rehabilitation | Admitting: Physical Medicine and Rehabilitation

## 2019-10-25 ENCOUNTER — Encounter: Payer: Self-pay | Admitting: Physical Medicine and Rehabilitation

## 2019-10-25 ENCOUNTER — Other Ambulatory Visit: Payer: Self-pay

## 2019-10-25 VITALS — BP 123/71 | HR 72 | Temp 97.6°F | Ht 71.0 in | Wt 313.0 lb

## 2019-10-25 DIAGNOSIS — R202 Paresthesia of skin: Secondary | ICD-10-CM | POA: Diagnosis not present

## 2019-10-25 DIAGNOSIS — R200 Anesthesia of skin: Secondary | ICD-10-CM

## 2019-10-25 DIAGNOSIS — Z79891 Long term (current) use of opiate analgesic: Secondary | ICD-10-CM | POA: Diagnosis not present

## 2019-10-25 DIAGNOSIS — G894 Chronic pain syndrome: Secondary | ICD-10-CM | POA: Insufficient documentation

## 2019-10-25 DIAGNOSIS — Z5181 Encounter for therapeutic drug level monitoring: Secondary | ICD-10-CM | POA: Insufficient documentation

## 2019-10-25 DIAGNOSIS — M48062 Spinal stenosis, lumbar region with neurogenic claudication: Secondary | ICD-10-CM

## 2019-10-25 DIAGNOSIS — M5416 Radiculopathy, lumbar region: Secondary | ICD-10-CM | POA: Insufficient documentation

## 2019-10-25 MED ORDER — TAPENTADOL HCL ER 150 MG PO TB12: 150.0000 mg | ORAL_TABLET | Freq: Two times a day (BID) | ORAL | 0 refills | Status: AC

## 2019-10-25 NOTE — Progress Notes (Signed)
Subjective:    Patient ID: Stephen Maldonado, male    DOB: Feb 23, 1955, 64 y.o.   MRN: [id number]  HPI   Pt is a 63 yr old male with hx of CAD on Eliquis, obesity, and CKD Stage III Here for evaluation of lumbar radiculopathy and chronic back pain. Deaf in L ear- in last 2 years.    Methadone has helped a little. When the hurt comes, just comes back as ever was.  With inactivity, keeps it at [place]- but gets bad again as soon as active.  So, at rest- 0/10- at rest- fingertips are numb.  When starts walking- hurts really bad-  When walking on level floor, is OK- almost immediate, with steps, very painful.   Fingertips are really numb-  1st to 3rd on L hand are really numb and tip of thumb on R hand- just started. Not diabetic-   Not on lyrica or gabapentin still- Finger feel swollen and tight.  Is on Lasix 80 mg BID  Neck has been starting to bother him- a long time- not worse lately, but no better- Like a "crick on his neck". Some days cannot turn head to sides- esp R side.    Pain Inventory Average Pain 10 Pain Right Now 6 My pain is constant, sharp, dull, tingling and aching  In the last 24 hours, has pain interfered with the following? General activity 8 Relation with others 7 Enjoyment of life 10 What TIME of day is your pain at its worst? morning , daytime and evening Sleep (in general) Good  Pain is worse with: walking, bending, standing and some activites Pain improves with: therapy/exercise and pacing activities Relief from Meds: 1  Family History  Problem Relation Age of Onset  . CAD Mother   . Lung cancer Mother   . Breast cancer Mother   . Diabetes Mother   . Heart failure Father   . CAD Father   . Diabetes Father    Social History   Socioeconomic History  . Marital status: Married    Spouse name: Not on file  . Number of children: 1  . Years of education: Not on file  . Highest education level: Not on file  Occupational History  . Not on file    Tobacco Use  . Smoking status: Never Smoker  . Smokeless tobacco: Former Systems developer    Types: Secondary school teacher  . Vaping Use: Never used  Substance and Sexual Activity  . Alcohol use: Yes    Comment: 1-2 beers days  . Drug use: No  . Sexual activity: Not Currently  Other Topics Concern  . Not on file  Social History Narrative  . Not on file   Social Determinants of Health   Financial Resource Strain:   . Difficulty of Paying Living Expenses: Not on file  Food Insecurity:   . Worried About Charity fundraiser in the Last Year: Not on file  . Ran Out of Food in the Last Year: Not on file  Transportation Needs:   . Lack of Transportation (Medical): Not on file  . Lack of Transportation (Non-Medical): Not on file  Physical Activity:   . Days of Exercise per Week: Not on file  . Minutes of Exercise per Session: Not on file  Stress:   . Feeling of Stress : Not on file  Social Connections:   . Frequency of Communication with Friends and Family: Not on file  . Frequency of Social Gatherings  with Friends and Family: Not on file  . Attends Religious Services: Not on file  . Active Member of Clubs or Organizations: Not on file  . Attends Archivist Meetings: Not on file  . Marital Status: Not on file   Past Surgical History:  Procedure Laterality Date  . CARDIOVERSION N/A 08/14/2018   Procedure: CARDIOVERSION;  Surgeon: Nigel Mormon, MD;  Location: [facility];  Service: Cardiovascular;  Laterality: N/A;  . COLONOSCOPY  06/14/2010  . CORONARY ANGIOGRAPHY N/A 07/11/2016   Procedure: Coronary Angiography;  Surgeon: Dixie Dials, MD;  Location: [place] [facility];  Service: Cardiovascular;  Laterality: N/A;  . CYSTOSCOPY W/ RETROGRADES Bilateral 10/03/2015   Procedure: CYSTOSCOPY WITH RETROGRADE PYELOGRAM;  Surgeon: [name], MD;  Location: [facility];  Service: Urology;  Laterality: Bilateral;  . GREEN LIGHT LASER TURP (TRANSURETHRAL RESECTION OF  PROSTATE N/A 10/03/2015   Procedure: GREEN LIGHT LASER,  TURP - STAGED(TRANSURETHRAL RESECTION OF PROSTATE;  Surgeon: [name], MD;  Location: [facility];  Service: Urology;  Laterality: N/A;  . INTRAVASCULAR PRESSURE WIRE/FFR STUDY N/A 07/14/2018   Procedure: INTRAVASCULAR PRESSURE WIRE/FFR STUDY;  Surgeon: Nigel Mormon, MD;  Location: [place] [facility];  Service: Cardiovascular;  Laterality: N/A;  . KNEE ARTHROSCOPY     not sure which knee  . LEFT HEART CATH AND CORONARY ANGIOGRAPHY N/A 07/14/2018   Procedure: LEFT HEART CATH AND CORONARY ANGIOGRAPHY;  Surgeon: Nigel Mormon, MD;  Location: [place] [facility];  Service: Cardiovascular;  Laterality: N/A;  . QUADRICEPS TENDON REPAIR Bilateral left 02/24/2003/   right 2006  . TOTAL KNEE ARTHROPLASTY Right 12/01/2015   Procedure: RIGHT TOTAL KNEE ARTHROPLASTY;  Surgeon: [name], MD;  [facility];  Service: Orthopedics;  Laterality: Right;  . TOTAL KNEE ARTHROPLASTY Left 01/10/2017   Procedure: LEFT TOTAL KNEE ARTHROPLASTY;  Surgeon: [name], MD;  [facility];  Service: Orthopedics;  Laterality: Left;  . TOTAL SHOULDER ARTHROPLASTY Right 01/23/2018   Procedure: RIGHT REVERSE TOTAL SHOULDER ARTHROPLASTY;  Surgeon: Meredith Pel, MD;  Location: [place];  Service: Orthopedics;  Laterality: Right;   Past Surgical History:  Procedure Laterality Date  . CARDIOVERSION N/A 08/14/2018   Procedure: CARDIOVERSION;  Surgeon: Nigel Mormon, MD;  Location: [place] [facility];  Service: Cardiovascular;  Laterality: N/A;  . COLONOSCOPY  06/14/2010  . CORONARY ANGIOGRAPHY N/A 07/11/2016   Procedure: Coronary Angiography;  Surgeon: Dixie Dials, MD;  Location: [place] [facility];  Service: Cardiovascular;  Laterality: N/A;  . CYSTOSCOPY W/ RETROGRADES Bilateral 10/03/2015   Procedure: CYSTOSCOPY WITH RETROGRADE PYELOGRAM;  Surgeon: [name], MD;  Location: [facility];  Service: Urology;  Laterality: Bilateral;  . GREEN LIGHT LASER TURP (TRANSURETHRAL RESECTION OF PROSTATE N/A 10/03/2015   Procedure: GREEN LIGHT LASER,  TURP - STAGED(TRANSURETHRAL RESECTION OF PROSTATE;  Surgeon: [name], MD;  Location[facility];  Service: Urology;  Laterality: N/A;  . INTRAVASCULAR PRESSURE WIRE/FFR STUDY N/A 07/14/2018   Procedure: INTRAVASCULAR PRESSURE WIRE/FFR STUDY;  Surgeon: Nigel Mormon, MD;  Location: [place] [facility];  Service: Cardiovascular;  Laterality: N/A;  . KNEE ARTHROSCOPY     not sure which knee  . LEFT HEART CATH AND CORONARY ANGIOGRAPHY N/A 07/14/2018   Procedure: LEFT HEART CATH AND CORONARY ANGIOGRAPHY;  Surgeon: Nigel Mormon, MD;  Location: [place] [facility];  Service: Cardiovascular;  Laterality: N/A;  . QUADRICEPS TENDON REPAIR Bilateral left 02/24/2003/  right 2006  . TOTAL KNEE ARTHROPLASTY Right 12/01/2015   Procedure: RIGHT TOTAL KNEE ARTHROPLASTY;  Surgeon: [name], MD;  [facility];  Service: Orthopedics;  Laterality: Right;  . TOTAL KNEE ARTHROPLASTY Left 01/10/2017   Procedure: LEFT TOTAL KNEE ARTHROPLASTY;  Surgeon: [name], MD;  [facility];  Service: Orthopedics;  Laterality: Left;  . TOTAL SHOULDER ARTHROPLASTY Right 01/23/2018   Procedure: RIGHT REVERSE TOTAL SHOULDER ARTHROPLASTY;  Surgeon: Meredith Pel, MD;  Location: [place];  Service: Orthopedics;  Laterality: Right;   Past Medical History:  Diagnosis Date  . Arthritis    knees  . Bladder spasms   . BPH (benign prostatic hyperplasia)   . DVT (deep venous thrombosis) ([place]) ocyt 2016   left leg tx medically  . GERD (gastroesophageal reflux disease)   . History of colon polyps    hyperplastic 06-14-2010  . Hydronephrosis   . Hypertension   . Obesity, morbid ([place])   . Urinary retention    BP 123/71   Pulse 72   Temp 97.6 F (36.4 C)   Ht 5\' 11"  (1.803 m)   Wt (!) 313 lb (142 kg)    SpO2 92%   BMI 43.65 kg/m   Opioid Risk Score:   Fall Risk Score:  `1  Depression screen PHQ 2/9  Depression screen [facility] 2/9 09/24/2019 05/17/2019  Decreased Interest 1 1  Down, Depressed, Hopeless 1 0  PHQ - 2 Score 2 1  Altered sleeping - 3  Tired, decreased energy - 3  Change in appetite - 0  Feeling bad or failure about yourself  - 0  Trouble concentrating - 0  Moving slowly or fidgety/restless - 0  Suicidal thoughts - 0  PHQ-9 Score - 7  Difficult doing work/chores - Somewhat difficult    Review of Systems  Musculoskeletal: Positive for back pain.       Leg pain  Neurological: Positive for weakness and numbness.       Tingling  All other systems reviewed and are negative.      Objective:   Physical Exam  Awake, alert, appropriate, sitting on exam table, NAD TTP across Low back- esp on R low back around "dimple" No change in strength Only the finger tips of L 1st-3rd digits on L side appears slightly swollen/tight and reduced sensation. Also on R 1st digit-"feels asleep".  Not sensory loss in carpal tunnel/ulnar distribution  MS: Deltoid, biceps, triceps, WE and grip 5/5 B/L, but finger abd 4+/5 B/L      Assessment & Plan:   Pt is a 64 yr old male with chronic low back pain with herniated disc and likely neurogenic claudication as well as new onset B/L hind numbness with progressive neck pain-   1. Nucynta ER generic- 150 mg 2x/day- for nerve and back pain- has failed- lyrica, Gabapentin, Duloxetine, Dilaudid, and methadone, as well as Zanaflex and Flexeril- nothing so far has worked- feel the only option if Nucynta to help with nerve and back pain. Needs prior authorization- will do.    2. Switch the Nucynta/Tapentadol- the 5 mg Methadone 2x/day, switch directly to Nucynta 150 mg ER 2x/day.   3. MRI of cervical spine- due to mild weakness in finger abduction, as well as neck pain along with progressive numbness/tingling of fingers on B/L hands.   4. F/U  in 1 month- took meds not taking off med list Not doing UDS today- changing meds  I spent a total of 25  minutes on appointment- as detailed above.

## 2019-10-25 NOTE — Patient Instructions (Signed)
  Pt is a 64 yr old male with chronic low back pain with herniated disc and likely neurogenic claudication as well as new onset B/L hind numbness with progressive neck pain-   1. Nucynta ER generic- 150 mg 2x/day- for nerve and back pain- has failed- lyrica, Gabapentin, Duloxetine, Dilaudid, and methadone, as well as Zanaflex and Flexeril- nothing so far has worked- feel the only option if Nucynta to help with nerve and back pain. Needs prior authorization- will do.    2. Switch the Nucynta/Tapentadol- the 5 mg Methadone 2x/day, switch directly to Nucynta 150 mg ER 2x/day.   3. MRI of cervical spine- due to mild weakness in finger abduction, as well as neck pain along with progressive numbness/tingling of fingers on B/L hands.   4. F/U in 1 month- took meds not taking off med list

## 2019-10-26 ENCOUNTER — Telehealth: Payer: Self-pay | Admitting: Physical Medicine and Rehabilitation

## 2019-10-26 DIAGNOSIS — G4733 Obstructive sleep apnea (adult) (pediatric): Secondary | ICD-10-CM | POA: Diagnosis not present

## 2019-10-26 NOTE — Telephone Encounter (Signed)
I have tried reaching patient to let him know his MRI was approved.  Patient will need to call [phone] to get this scheduled over at [facility].  Just in case patient calls, his voicemail not set up, give him the number to call for MRI.

## 2019-10-27 DIAGNOSIS — I10 Essential (primary) hypertension: Secondary | ICD-10-CM | POA: Diagnosis not present

## 2019-10-27 DIAGNOSIS — I509 Heart failure, unspecified: Secondary | ICD-10-CM | POA: Diagnosis not present

## 2019-10-27 DIAGNOSIS — I129 Hypertensive chronic kidney disease with stage 1 through stage 4 chronic kidney disease, or unspecified chronic kidney disease: Secondary | ICD-10-CM | POA: Diagnosis not present

## 2019-10-27 DIAGNOSIS — I4891 Unspecified atrial fibrillation: Secondary | ICD-10-CM | POA: Diagnosis not present

## 2019-11-03 ENCOUNTER — Other Ambulatory Visit: Payer: Self-pay

## 2019-11-03 ENCOUNTER — Ambulatory Visit (HOSPITAL_COMMUNITY)
Admission: RE | Admit: 2019-11-03 | Discharge: 2019-11-03 | Disposition: A | Discharge status: Home / Self Care | Payer: BC Managed Care – PPO | Source: Ambulatory Visit | Attending: Physical Medicine and Rehabilitation | Admitting: Physical Medicine and Rehabilitation

## 2019-11-03 DIAGNOSIS — R200 Anesthesia of skin: Secondary | ICD-10-CM | POA: Insufficient documentation

## 2019-11-03 DIAGNOSIS — M50123 Cervical disc disorder at C6-C7 level with radiculopathy: Secondary | ICD-10-CM | POA: Diagnosis not present

## 2019-11-03 DIAGNOSIS — M4802 Spinal stenosis, cervical region: Secondary | ICD-10-CM | POA: Diagnosis not present

## 2019-11-03 DIAGNOSIS — R202 Paresthesia of skin: Secondary | ICD-10-CM | POA: Diagnosis not present

## 2019-11-03 DIAGNOSIS — M50121 Cervical disc disorder at C4-C5 level with radiculopathy: Secondary | ICD-10-CM | POA: Diagnosis not present

## 2019-11-19 ENCOUNTER — Other Ambulatory Visit: Payer: Self-pay

## 2019-11-19 ENCOUNTER — Encounter: Payer: Self-pay | Admitting: Physical Medicine and Rehabilitation

## 2019-11-19 ENCOUNTER — Encounter
Payer: BC Managed Care – PPO | Attending: Physical Medicine and Rehabilitation | Admitting: Physical Medicine and Rehabilitation

## 2019-11-19 VITALS — BP 135/83 | HR 80 | Temp 98.5°F | Ht 71.0 in | Wt 319.5 lb

## 2019-11-19 DIAGNOSIS — R29898 Other symptoms and signs involving the musculoskeletal system: Secondary | ICD-10-CM | POA: Diagnosis not present

## 2019-11-19 DIAGNOSIS — G894 Chronic pain syndrome: Secondary | ICD-10-CM | POA: Diagnosis not present

## 2019-11-19 DIAGNOSIS — R200 Anesthesia of skin: Secondary | ICD-10-CM | POA: Insufficient documentation

## 2019-11-19 DIAGNOSIS — R202 Paresthesia of skin: Secondary | ICD-10-CM

## 2019-11-19 DIAGNOSIS — M5416 Radiculopathy, lumbar region: Secondary | ICD-10-CM | POA: Insufficient documentation

## 2019-11-19 NOTE — Patient Instructions (Signed)
  Pt is a 64 yr old male with chronic low back pain with herniated disc and likely neurogenic claudication as well as new onset B/L hind numbness with progressive neck pain-    1. Worsening numbness of R and L hands- getting worse by the week- MRI shows C3/4 severe L and moderate Right neuroforaminal stenosis.  No Hoffman's or increased tone, of note.   2. Will refer to Neurosurgery- ASAP- placed referral- 336- [phone]  3. Will wait on making med changes today.  Since sending to [facility].    4. Discussed low dose Naltrexone- 4.5 mg nightly for nerve pain- sent in to pharmacy by phone call-   Will try 14 days to see if helpful.  [facility] [phone] [place], [place], [name]  5. F/U in 2 weeks by phone to let me know 1. How is pain 2. Neurosurgery plan.  - f/u in 1 month- if things don't work, we will discuss pain pump and spinal stimulator.

## 2019-11-19 NOTE — Progress Notes (Signed)
Subjective:    Patient ID: Stephen Maldonado, male    DOB: [birth year], 64 y.o.   MRN: [id number]  HPI   Pt is a 64 yr old male with chronic low back pain with herniated disc and likely neurogenic claudication as well as new onset B/L hind numbness with progressive neck pain-   Neck still hurts- thinks it's from his shoulder operation.    If uses arms a lot- can't even move at night.    Nucynta isn't doing anything at all for pain control. Took for 7 days.   Tried epidural injection x 1- didn't do squat.   Rides four wheeler to ride to mailbox- and takes trash- cannot walk it anymore.     Pain Inventory Average Pain 8 Pain Right Now 9 My pain is sharp, stabbing, tingling and aching  In the last 24 hours, has pain interfered with the following? General activity 9 Relation with others 8 Enjoyment of life 10 What TIME of day is your pain at its worst? morning , daytime, evening and night Sleep (in general) Good  Pain is worse with: walking, bending, standing, some activites and shower Pain improves with: medication Relief from Meds: 1  Family History  Problem Relation Age of Onset  . CAD Mother   . Lung cancer Mother   . Breast cancer Mother   . Diabetes Mother   . Heart failure Father   . CAD Father   . Diabetes Father    Social History   Socioeconomic History  . Marital status: Married    Spouse name: Not on file  . Number of children: 1  . Years of education: Not on file  . Highest education level: Not on file  Occupational History  . Not on file  Tobacco Use  . Smoking status: Never Smoker  . Smokeless tobacco: Former Systems developer    Types: Secondary school teacher  . Vaping Use: Never used  Substance and Sexual Activity  . Alcohol use: Yes    Comment: 1-2 beers days  . Drug use: No  . Sexual activity: Not Currently  Other Topics Concern  . Not on file  Social History Narrative  . Not on file   Social Determinants of Health   Financial Resource Strain:     . Difficulty of Paying Living Expenses: Not on file  Food Insecurity:   . Worried About Charity fundraiser in the Last Year: Not on file  . Ran Out of Food in the Last Year: Not on file  Transportation Needs:   . Lack of Transportation (Medical): Not on file  . Lack of Transportation (Non-Medical): Not on file  Physical Activity:   . Days of Exercise per Week: Not on file  . Minutes of Exercise per Session: Not on file  Stress:   . Feeling of Stress : Not on file  Social Connections:   . Frequency of Communication with Friends and Family: Not on file  . Frequency of Social Gatherings with Friends and Family: Not on file  . Attends Religious Services: Not on file  . Active Member of Clubs or Organizations: Not on file  . Attends Archivist Meetings: Not on file  . Marital Status: Not on file   Past Surgical History:  Procedure Laterality Date  . CARDIOVERSION N/A 08/14/2018   Procedure: CARDIOVERSION;  Surgeon: Nigel Mormon, MD;  Location: [place] [facility];  Service: Cardiovascular;  Laterality: N/A;  . COLONOSCOPY  06/14/2010  .  CORONARY ANGIOGRAPHY N/A 07/11/2016   Procedure: Coronary Angiography;  Surgeon: Dixie Dials, MD;  Location: [place] [facility];  Service: Cardiovascular;  Laterality: N/A;  . CYSTOSCOPY W/ RETROGRADES Bilateral 10/03/2015   Procedure: CYSTOSCOPY WITH RETROGRADE PYELOGRAM;  Surgeon: [name], MD;  Location: [facility];  Service: Urology;  Laterality: Bilateral;  . GREEN LIGHT LASER TURP (TRANSURETHRAL RESECTION OF PROSTATE N/A 10/03/2015   Procedure: GREEN LIGHT LASER,  TURP - STAGED(TRANSURETHRAL RESECTION OF PROSTATE;  Surgeon: [name], MD;  Location: [facility];  Service: Urology;  Laterality: N/A;  . INTRAVASCULAR PRESSURE WIRE/FFR STUDY N/A 07/14/2018   Procedure: INTRAVASCULAR PRESSURE WIRE/FFR STUDY;  Surgeon: Nigel Mormon, MD;  Location: [place] [facility];  Service: Cardiovascular;   Laterality: N/A;  . KNEE ARTHROSCOPY     not sure which knee  . LEFT HEART CATH AND CORONARY ANGIOGRAPHY N/A 07/14/2018   Procedure: LEFT HEART CATH AND CORONARY ANGIOGRAPHY;  Surgeon: Nigel Mormon, MD;  Location: [place] [facility];  Service: Cardiovascular;  Laterality: N/A;  . QUADRICEPS TENDON REPAIR Bilateral left 02/24/2003/   right 2006  . TOTAL KNEE ARTHROPLASTY Right 12/01/2015   Procedure: RIGHT TOTAL KNEE ARTHROPLASTY;  Surgeon: [name], MD;  [facility];  Service: Orthopedics;  Laterality: Right;  . TOTAL KNEE ARTHROPLASTY Left 01/10/2017   Procedure: LEFT TOTAL KNEE ARTHROPLASTY;  Surgeon: [name], MD;  [facility];  Service: Orthopedics;  Laterality: Left;  . TOTAL SHOULDER ARTHROPLASTY Right 01/23/2018   Procedure: RIGHT REVERSE TOTAL SHOULDER ARTHROPLASTY;  Surgeon: Meredith Pel, MD;  Location: [place];  Service: Orthopedics;  Laterality: Right;   Past Surgical History:  Procedure Laterality Date  . CARDIOVERSION N/A 08/14/2018   Procedure: CARDIOVERSION;  Surgeon: Nigel Mormon, MD;  Location: [place] [facility];  Service: Cardiovascular;  Laterality: N/A;  . COLONOSCOPY  06/14/2010  . CORONARY ANGIOGRAPHY N/A 07/11/2016   Procedure: Coronary Angiography;  Surgeon: Dixie Dials, MD;  Location: [place] [facility];  Service: Cardiovascular;  Laterality: N/A;  . CYSTOSCOPY W/ RETROGRADES Bilateral 10/03/2015   Procedure: CYSTOSCOPY WITH RETROGRADE PYELOGRAM;  Surgeon: [name], MD;  Location: [facility];  Service: Urology;  Laterality: Bilateral;  . GREEN LIGHT LASER TURP (TRANSURETHRAL RESECTION OF PROSTATE N/A 10/03/2015   Procedure: GREEN LIGHT LASER,  TURP - STAGED(TRANSURETHRAL RESECTION OF PROSTATE;  Surgeon: [name], MD;  [facility];  Service: Urology;  Laterality: N/A;  . INTRAVASCULAR PRESSURE WIRE/FFR STUDY N/A 07/14/2018   Procedure: INTRAVASCULAR PRESSURE  WIRE/FFR STUDY;  Surgeon: Nigel Mormon, MD;  Location: [place] [facility];  Service: Cardiovascular;  Laterality: N/A;  . KNEE ARTHROSCOPY     not sure which knee  . LEFT HEART CATH AND CORONARY ANGIOGRAPHY N/A 07/14/2018   Procedure: LEFT HEART CATH AND CORONARY ANGIOGRAPHY;  Surgeon: Nigel Mormon, MD;  Location: [place] [facility];  Service: Cardiovascular;  Laterality: N/A;  . QUADRICEPS TENDON REPAIR Bilateral left 02/24/2003/   right 2006  . TOTAL KNEE ARTHROPLASTY Right 12/01/2015   Procedure: RIGHT TOTAL KNEE ARTHROPLASTY;  Surgeon: [name], MD;  [facility];  Service: Orthopedics;  Laterality: Right;  . TOTAL KNEE ARTHROPLASTY Left 01/10/2017   Procedure: LEFT TOTAL KNEE ARTHROPLASTY;  Surgeon: [name], MD;  [facility];  Service: Orthopedics;  Laterality: Left;  . TOTAL SHOULDER ARTHROPLASTY Right 01/23/2018   Procedure: RIGHT REVERSE TOTAL SHOULDER ARTHROPLASTY;  Surgeon: Meredith Pel,  MD;  Location: [place];  Service: Orthopedics;  Laterality: Right;   Past Medical History:  Diagnosis Date  . Arthritis    knees  . Bladder spasms   . BPH (benign prostatic hyperplasia)   . DVT (deep venous thrombosis) ([place]) ocyt 2016   left leg tx medically  . GERD (gastroesophageal reflux disease)   . History of colon polyps    hyperplastic 06-14-2010  . Hydronephrosis   . Hypertension   . Obesity, morbid ([place])   . Urinary retention    BP 135/83   Pulse 80   Temp 98.5 F (36.9 C)   Ht 5\' 11"  (1.803 m)   Wt (!) 319 lb 8 oz (144.9 kg)   SpO2 92%   BMI 44.56 kg/m   Opioid Risk Score:   Fall Risk Score:  `1  Depression screen PHQ 2/9  Depression screen [facility] 2/9 09/24/2019 05/17/2019  Decreased Interest 1 1  Down, Depressed, Hopeless 1 0  PHQ - 2 Score 2 1  Altered sleeping - 3  Tired, decreased energy - 3  Change in appetite - 0  Feeling bad or failure about yourself  - 0  Trouble concentrating - 0  Moving slowly or  fidgety/restless - 0  Suicidal thoughts - 0  PHQ-9 Score - 7  Difficult doing work/chores - Somewhat difficult   Review of Systems An entire ROS was completed and found to be negative except  For HPI    Objective:   Physical Exam Still decreased sensation in  L first 3 fingers and R thumb (can't button shirt anymore)  Fine motor coordination is OK, but cannot pick up penny off table Deltoid, biceps, triceps, WE and grip 5/5 B/L, but finger abd 4+/5 B/L  No Hoffman's, B/L- no increased tone.         Assessment & Plan:    Pt is a 64 yr old male with chronic low back pain with herniated disc and likely neurogenic claudication as well as new onset B/L hind numbness with progressive neck pain-    1. Worsening numbness of R and L hands- getting worse by the week- MRI shows C3/4 severe L and moderate Right neuroforaminal stenosis.  No Hoffman's or increased tone, of note.   2. Will refer to Neurosurgery- ASAP- placed referral- [phone]- [phone]  3. Will wait on making med changes today.  Since sending to [facility].    4. Discussed low dose Naltrexone sent in Rx to pharmacy-- 4.5 mg nightly for nerve pain-  Will try 14 days to see if helpful.  [facility] [phone] [place], [place], [name]  5. F/U in 2 weeks by phone to let me know 1. How is pain 2. Neurosurgery plan.  - f/u in 1 month- if things don't work, we will discuss pain pump and spinal stimulator.    I spent a total of 40 minutes on visit- as detailed above.

## 2019-11-24 DIAGNOSIS — M5412 Radiculopathy, cervical region: Secondary | ICD-10-CM | POA: Diagnosis not present

## 2019-11-24 DIAGNOSIS — G5603 Carpal tunnel syndrome, bilateral upper limbs: Secondary | ICD-10-CM | POA: Diagnosis not present

## 2019-11-25 DIAGNOSIS — G4733 Obstructive sleep apnea (adult) (pediatric): Secondary | ICD-10-CM | POA: Diagnosis not present

## 2019-12-17 ENCOUNTER — Telehealth: Payer: Self-pay

## 2019-12-17 ENCOUNTER — Encounter: Payer: BC Managed Care – PPO | Admitting: Physical Medicine and Rehabilitation

## 2019-12-17 NOTE — Telephone Encounter (Signed)
Patient wife called stating patient is in a lot of pain and he's not prescribed any medication. Next appt not until December.

## 2019-12-17 NOTE — Telephone Encounter (Signed)
  Low dose naltrexone made him feel a little anxious- but didn't help a dang bit.   Went to [facility] yesterday- did EMG.   Has severe carpal tunnel in LUE.  Didn't tell him about neck.    Pt's wife called and in a LOT of pain.  Told [facility] the main problem was low back- didn't comment at all.   Seeing Dr Annette Stable on f/u next week. Saw [facility] this appointment- only thing has was EMG appointment.   R thumb a little numb.   Suggest writing down his issues-  Sees Dr Annette Stable 11/17-  Explained we've literally tried every medicine I can think of, including very strong opiates with no results.  We likely need to think about stimulator- spinal cord or pain pump.

## 2019-12-26 DIAGNOSIS — G4733 Obstructive sleep apnea (adult) (pediatric): Secondary | ICD-10-CM | POA: Diagnosis not present

## 2020-01-10 ENCOUNTER — Other Ambulatory Visit: Payer: Self-pay

## 2020-01-10 ENCOUNTER — Encounter: Payer: Self-pay | Admitting: Physical Medicine and Rehabilitation

## 2020-01-10 ENCOUNTER — Telehealth: Payer: Self-pay | Admitting: *Deleted

## 2020-01-10 ENCOUNTER — Encounter
Payer: BC Managed Care – PPO | Attending: Physical Medicine and Rehabilitation | Admitting: Physical Medicine and Rehabilitation

## 2020-01-10 VITALS — BP 131/79 | HR 77 | Temp 98.3°F | Ht 71.0 in | Wt 320.0 lb

## 2020-01-10 DIAGNOSIS — G894 Chronic pain syndrome: Secondary | ICD-10-CM | POA: Insufficient documentation

## 2020-01-10 DIAGNOSIS — R269 Unspecified abnormalities of gait and mobility: Secondary | ICD-10-CM | POA: Insufficient documentation

## 2020-01-10 DIAGNOSIS — M5416 Radiculopathy, lumbar region: Secondary | ICD-10-CM | POA: Insufficient documentation

## 2020-01-10 MED ORDER — OXYCODONE-ACETAMINOPHEN 10-325 MG PO TABS: 1.0000 | ORAL_TABLET | Freq: Four times a day (QID) | ORAL | 0 refills | Status: AC | PRN

## 2020-01-10 NOTE — Patient Instructions (Signed)
Pt is a 64 yr old male with chronic low back pain with herniated disc and likely neurogenic claudication as well as new onset B/L hind numbness with progressive neck pain-   1. Spoke to Dr Tivis Ringer in [facility]- will send to Dr Tivis Ringer, since didn't have good listening experience with Dr Annette Stable.  [phone]   2. Will send in Oxycodone- had some at home and took 2 pills- helped some.  Will give for those really bad days, so doesn't get constipated.  [facility] [place] [name]  3. Spent a lot of time discussing options- and decided to send to Dr Tivis Ringer.   4. Will go get appointment with Dr Tivis Ringer, then call ME to get appointment here AFTER Dr Tivis Ringer done with plan. So probably ~ 3 months.  Keep ME in the loop, please.   5. Went over surgery possibilities-  And might need another lumbar MRI- per Dr Tivis Ringer, since pain MORE on RIGHT but MRI shows more issues on LEFT.  6. Suggest might need over the counter mild laxative if taking pain medicine regularly.

## 2020-01-10 NOTE — Telephone Encounter (Signed)
Mr Urwin called and said Dr Dagoberto Ligas requested he call back after he has spoken with the surgeon.  His number is [phone].

## 2020-01-10 NOTE — Telephone Encounter (Signed)
Has appointment with Dr Tivis Ringer 02/07/19.  Needs records from our office.   Needs to get lumbar MRI of [facility]- [address]   Will also send in a week's supply of Percocet 10/325 mg q6 hours prn- since new Rx for him- and go from there- when calls back, will send in 30 days supply.

## 2020-01-10 NOTE — Progress Notes (Signed)
Subjective:    Patient ID: [name], male    DOB: 1955/11/02, 64 y.o.   MRN: [id number]  HPI  Pt is a 64 yr old male with chronic low back pain with herniated disc and likely neurogenic claudication as well as new onset B/L hind numbness with progressive neck pain-   No problem with neck lately since wife bought- is firm and molded- but has done the trick.   Carpal tunnel is easing off- scheduled for surgery on L hand for carpal tunnel release.   Done with medications- feels like nothing is helping.   Saw Dr Annette Stable- and someone with name that starts with B.   Balance has also decreased due to pain. Doesn't think he's weaker in LEs- except maybe hip flexors.  Can walk only ~ 30 yards- (90-100 ft), hurting bad and needs to lean on something or sit down.       Pain Inventory Average Pain 10 Pain Right Now 10 My pain is sharp, dull, stabbing and aching  In the last 24 hours, has pain interfered with the following? General activity 9 Relation with others 8 Enjoyment of life 10 What TIME of day is your pain at its worst? morning , daytime, evening and night Sleep (in general) Good  Pain is worse with: walking, bending, sitting, inactivity, standing and some activites Pain improves with: nothing Relief from Meds: 0  Family History  Problem Relation Age of Onset  . CAD Mother   . Lung cancer Mother   . Breast cancer Mother   . Diabetes Mother   . Heart failure Father   . CAD Father   . Diabetes Father    Social History   Socioeconomic History  . Marital status: Married    Spouse name: Not on file  . Number of children: 1  . Years of education: Not on file  . Highest education level: Not on file  Occupational History  . Not on file  Tobacco Use  . Smoking status: Never Smoker  . Smokeless tobacco: Former Systems developer    Types: Secondary school teacher  . Vaping Use: Never used  Substance and Sexual Activity  . Alcohol use: Yes    Comment: 1-2 beers days  . Drug use:  No  . Sexual activity: Not Currently  Other Topics Concern  . Not on file  Social History Narrative  . Not on file   Social Determinants of Health   Financial Resource Strain:   . Difficulty of Paying Living Expenses: Not on file  Food Insecurity:   . Worried About Charity fundraiser in the Last Year: Not on file  . Ran Out of Food in the Last Year: Not on file  Transportation Needs:   . Lack of Transportation (Medical): Not on file  . Lack of Transportation (Non-Medical): Not on file  Physical Activity:   . Days of Exercise per Week: Not on file  . Minutes of Exercise per Session: Not on file  Stress:   . Feeling of Stress : Not on file  Social Connections:   . Frequency of Communication with Friends and Family: Not on file  . Frequency of Social Gatherings with Friends and Family: Not on file  . Attends Religious Services: Not on file  . Active Member of Clubs or Organizations: Not on file  . Attends Archivist Meetings: Not on file  . Marital Status: Not on file   Past Surgical History:  Procedure Laterality  Date  . CARDIOVERSION N/A 08/14/2018   Procedure: CARDIOVERSION;  Surgeon: Nigel Mormon, MD;  Location: [facility] [facility];  Service: Cardiovascular;  Laterality: N/A;  . COLONOSCOPY  06/14/2010  . CORONARY ANGIOGRAPHY N/A 07/11/2016   Procedure: Coronary Angiography;  Surgeon: Dixie Dials, MD;  Location: [place] [facility];  Service: Cardiovascular;  Laterality: N/A;  . CYSTOSCOPY W/ RETROGRADES Bilateral 10/03/2015   Procedure: CYSTOSCOPY WITH RETROGRADE PYELOGRAM;  Surgeon: [name], MD;  Location: [facility];  Service: Urology;  Laterality: Bilateral;  . GREEN LIGHT LASER TURP (TRANSURETHRAL RESECTION OF PROSTATE N/A 10/03/2015   Procedure: GREEN LIGHT LASER,  TURP - STAGED(TRANSURETHRAL RESECTION OF PROSTATE;  Surgeon: [name], MD;  [facility];  Service: Urology;  Laterality: N/A;  .  INTRAVASCULAR PRESSURE WIRE/FFR STUDY N/A 07/14/2018   Procedure: INTRAVASCULAR PRESSURE WIRE/FFR STUDY;  Surgeon: Nigel Mormon, MD;  Location: [place] [facility];  Service: Cardiovascular;  Laterality: N/A;  . KNEE ARTHROSCOPY     not sure which knee  . LEFT HEART CATH AND CORONARY ANGIOGRAPHY N/A 07/14/2018   Procedure: LEFT HEART CATH AND CORONARY ANGIOGRAPHY;  Surgeon: Nigel Mormon, MD;  Location: [place] [facility];  Service: Cardiovascular;  Laterality: N/A;  . QUADRICEPS TENDON REPAIR Bilateral left 02/24/2003/   right 2006  . TOTAL KNEE ARTHROPLASTY Right 12/01/2015   Procedure: RIGHT TOTAL KNEE ARTHROPLASTY;  Surgeon: [name], MD;  [facility];  Service: Orthopedics;  Laterality: Right;  . TOTAL KNEE ARTHROPLASTY Left 01/10/2017   Procedure: LEFT TOTAL KNEE ARTHROPLASTY;  Surgeon: [name], MD;  Location: [facility];  Service: Orthopedics;  Laterality: Left;  . TOTAL SHOULDER ARTHROPLASTY Right 01/23/2018   Procedure: RIGHT REVERSE TOTAL SHOULDER ARTHROPLASTY;  Surgeon: Meredith Pel, MD;  Location: [place];  Service: Orthopedics;  Laterality: Right;   Past Surgical History:  Procedure Laterality Date  . CARDIOVERSION N/A 08/14/2018   Procedure: CARDIOVERSION;  Surgeon: Nigel Mormon, MD;  Location: [place] [facility];  Service: Cardiovascular;  Laterality: N/A;  . COLONOSCOPY  06/14/2010  . CORONARY ANGIOGRAPHY N/A 07/11/2016   Procedure: Coronary Angiography;  Surgeon: Dixie Dials, MD;  Location: [place] [facility];  Service: Cardiovascular;  Laterality: N/A;  . CYSTOSCOPY W/ RETROGRADES Bilateral 10/03/2015   Procedure: CYSTOSCOPY WITH RETROGRADE PYELOGRAM;  Surgeon: [name], MD;  Location: [facility];  Service: Urology;  Laterality: Bilateral;  . GREEN LIGHT LASER TURP (TRANSURETHRAL RESECTION OF PROSTATE N/A 10/03/2015   Procedure: GREEN LIGHT LASER,  TURP - STAGED(TRANSURETHRAL RESECTION OF PROSTATE;  Surgeon:  [name], MD;  Location: [facility];  Service: Urology;  Laterality: N/A;  . INTRAVASCULAR PRESSURE WIRE/FFR STUDY N/A 07/14/2018   Procedure: INTRAVASCULAR PRESSURE WIRE/FFR STUDY;  Surgeon: Nigel Mormon, MD;  Location: [place] [facility];  Service: Cardiovascular;  Laterality: N/A;  . KNEE ARTHROSCOPY     not sure which knee  . LEFT HEART CATH AND CORONARY ANGIOGRAPHY N/A 07/14/2018   Procedure: LEFT HEART CATH AND CORONARY ANGIOGRAPHY;  Surgeon: Nigel Mormon, MD;  Location: [place] [facility];  Service: Cardiovascular;  Laterality: N/A;  . QUADRICEPS TENDON REPAIR Bilateral left 02/24/2003/   right 2006  . TOTAL KNEE ARTHROPLASTY Right 12/01/2015   Procedure: RIGHT TOTAL KNEE ARTHROPLASTY;  Surgeon: [name], MD;  [facility];  Service: Orthopedics;  Laterality: Right;  . TOTAL KNEE ARTHROPLASTY Left 01/10/2017   Procedure: LEFT TOTAL KNEE ARTHROPLASTY;  Surgeon: Ninfa Linden,  Lind Guest, MD;  [facility];  Service: Orthopedics;  Laterality: Left;  . TOTAL SHOULDER ARTHROPLASTY Right 01/23/2018   Procedure: RIGHT REVERSE TOTAL SHOULDER ARTHROPLASTY;  Surgeon: Meredith Pel, MD;  Location: [place];  Service: Orthopedics;  Laterality: Right;   Past Medical History:  Diagnosis Date  . Arthritis    knees  . Bladder spasms   . BPH (benign prostatic hyperplasia)   . DVT (deep venous thrombosis) ([place]) ocyt 2016   left leg tx medically  . GERD (gastroesophageal reflux disease)   . History of colon polyps    hyperplastic 06-14-2010  . Hydronephrosis   . Hypertension   . Obesity, morbid ([place])   . Urinary retention    BP 131/79   Pulse 77   Temp 98.3 F (36.8 C)   Ht 5\' 11"  (1.803 m)   Wt (!) 320 lb (145.2 kg)   SpO2 93%   BMI 44.63 kg/m   Opioid Risk Score:   Fall Risk Score:  `1  Depression screen PHQ 2/9  Depression screen [facility] 2/9 09/24/2019 05/17/2019  Decreased Interest 1 1  Down, Depressed, Hopeless 1 0  PHQ -  2 Score 2 1  Altered sleeping - 3  Tired, decreased energy - 3  Change in appetite - 0  Feeling bad or failure about yourself  - 0  Trouble concentrating - 0  Moving slowly or fidgety/restless - 0  Suicidal thoughts - 0  PHQ-9 Score - 7  Difficult doing work/chores - Somewhat difficult    Review of Systems  Constitutional: Negative.   HENT: Negative.   Eyes: Negative.   Respiratory: Negative.   Cardiovascular: Negative.   Gastrointestinal: Negative.   Endocrine: Negative.   Genitourinary: Negative.   Musculoskeletal: Positive for back pain.  Skin: Negative.   Allergic/Immunologic: Negative.   Neurological: Positive for weakness.  Psychiatric/Behavioral: Negative.   All other systems reviewed and are negative.      Objective:   Physical Exam Awake, alert, appropriate, BMI 44, can tell is in pain just sitting there, no acute distress L SLR (+) MS: HF 4 to 4+/5 B/L- hard to tease out pain vs weakness R KE and KF 4+/5, otherwise 5/5 in L KE/KF, and DF and PF- very strong in 5/5 muscles.  Still TTP in mainly R side- lumbar paraspinals/midline spine.        Assessment & Plan:   Pt is a 64 yr old male with chronic low back pain with herniated disc and likely neurogenic claudication as well as new onset B/L hind numbness with progressive neck pain-   1. Spoke to Dr Tivis Ringer in [facility]- will send to Dr Tivis Ringer, since didn't have good listening experience with Dr Annette Stable.  [phone]   2. Will send in Oxycodone- had some at home and took 2 pills- helped some.  Will give for those really bad days, so doesn't get constipated.  [facility] [place] [name]  3. Spent a lot of time discussing options- and decided to send to Dr Tivis Ringer.   4. Will go get appointment with Dr Tivis Ringer, then call ME to get appointment here AFTER Dr Tivis Ringer done with plan. So probably ~ 3 months.   5. Went over surgery possibilities-  And might need another lumbar MRI- per Dr Tivis Ringer, since pain MORE  on RIGHT but MRI shows more issues on LEFT.  6. Suggest might need over the counter mild laxative if taking pain medicine regularly.    I spent  a total of 30 minutes on visit- as detailed above.

## 2020-01-18 DIAGNOSIS — G4733 Obstructive sleep apnea (adult) (pediatric): Secondary | ICD-10-CM | POA: Diagnosis not present

## 2020-03-24 ENCOUNTER — Other Ambulatory Visit: Payer: Self-pay

## 2020-03-24 ENCOUNTER — Encounter: Payer: Self-pay | Admitting: Physical Medicine and Rehabilitation

## 2020-03-24 ENCOUNTER — Encounter
Payer: Medicare Other | Attending: Physical Medicine and Rehabilitation | Admitting: Physical Medicine and Rehabilitation

## 2020-03-24 VITALS — BP 135/79 | HR 83 | Temp 99.2°F | Ht 71.0 in | Wt 325.0 lb

## 2020-03-24 DIAGNOSIS — M5416 Radiculopathy, lumbar region: Secondary | ICD-10-CM | POA: Diagnosis not present

## 2020-03-24 DIAGNOSIS — G894 Chronic pain syndrome: Secondary | ICD-10-CM | POA: Insufficient documentation

## 2020-03-24 DIAGNOSIS — R42 Dizziness and giddiness: Secondary | ICD-10-CM | POA: Insufficient documentation

## 2020-03-24 NOTE — Progress Notes (Signed)
Subjective:    Patient ID: Stephen Maldonado, male    DOB: 11-11-1955, 65 y.o.   MRN: [id number]  HPI  Pt is a 65 yr old male with chronic low back pain with herniated disc and likely neurogenic claudication as well as new onset B/L hind numbness with progressive neck pain-  Tried Percocet- only lasted 30 minutes-  But, Didn't help back pain at all- made him a buzz  Ready to have radiofrequency ablation.  Epidural steroid injections weren't helpful.   Neck pain resolved since started using new pillow.   R low back pain radiates down in R buttocks Cannot walk across the yard or parking lot to get into here.  All the handicapped spots were taken this AM, which made things worse.  Have to stop and lean on something when walks even short distances- >50 ft.   R foot used to go to sleep, but hasn't done that in a long while.  Behaving right now.   Real Dizzy last week for a few days- 2 days straight- didn't feel good- wondering about seeing ENT again.   Pain Inventory Average Pain 10 Pain Right Now 9 My pain is constant, sharp, dull and stabbing  In the last 24 hours, has pain interfered with the following? General activity ? Relation with others ? Enjoyment of life ? What TIME of day is your pain at its worst? morning , daytime, evening and night Sleep (in general) Good  Pain is worse with: walking, bending, standing and some activites Pain improves with: nothing Relief from Meds: 0  Family History  Problem Relation Age of Onset  . CAD Mother   . Lung cancer Mother   . Breast cancer Mother   . Diabetes Mother   . Heart failure Father   . CAD Father   . Diabetes Father    Social History   Socioeconomic History  . Marital status: Married    Spouse name: Not on file  . Number of children: 1  . Years of education: Not on file  . Highest education level: Not on file  Occupational History  . Not on file  Tobacco Use  . Smoking status: Never Smoker  . Smokeless  tobacco: Former Systems developer    Types: Secondary school teacher  . Vaping Use: Never used  Substance and Sexual Activity  . Alcohol use: Yes    Comment: 1-2 beers days  . Drug use: No  . Sexual activity: Not Currently  Other Topics Concern  . Not on file  Social History Narrative  . Not on file   Social Determinants of Health   Financial Resource Strain: Not on file  Food Insecurity: Not on file  Transportation Needs: Not on file  Physical Activity: Not on file  Stress: Not on file  Social Connections: Not on file   Past Surgical History:  Procedure Laterality Date  . CARDIOVERSION N/A 08/14/2018   Procedure: CARDIOVERSION;  Surgeon: Nigel Mormon, MD;  Location: [place] [facility];  Service: Cardiovascular;  Laterality: N/A;  . COLONOSCOPY  06/14/2010  . CORONARY ANGIOGRAPHY N/A 07/11/2016   Procedure: Coronary Angiography;  Surgeon: Dixie Dials, MD;  Location: [place] [facility];  Service: Cardiovascular;  Laterality: N/A;  . CYSTOSCOPY W/ RETROGRADES Bilateral 10/03/2015   Procedure: CYSTOSCOPY WITH RETROGRADE PYELOGRAM;  Surgeon: [name], MD;  Location: [facility];  Service: Urology;  Laterality: Bilateral;  . GREEN LIGHT LASER TURP (TRANSURETHRAL RESECTION OF PROSTATE N/A 10/03/2015  Procedure: GREEN LIGHT LASER,  TURP - STAGED(TRANSURETHRAL RESECTION OF PROSTATE;  Surgeon: [name], MD;  Location: [facility];  Service: Urology;  Laterality: N/A;  . INTRAVASCULAR PRESSURE WIRE/FFR STUDY N/A 07/14/2018   Procedure: INTRAVASCULAR PRESSURE WIRE/FFR STUDY;  Surgeon: Nigel Mormon, MD;  Location: [place] [facility];  Service: Cardiovascular;  Laterality: N/A;  . KNEE ARTHROSCOPY     not sure which knee  . LEFT HEART CATH AND CORONARY ANGIOGRAPHY N/A 07/14/2018   Procedure: LEFT HEART CATH AND CORONARY ANGIOGRAPHY;  Surgeon: Nigel Mormon, MD;  Location: [place] [facility];  Service: Cardiovascular;  Laterality: N/A;  . QUADRICEPS  TENDON REPAIR Bilateral left 02/24/2003/   right 2006  . TOTAL KNEE ARTHROPLASTY Right 12/01/2015   Procedure: RIGHT TOTAL KNEE ARTHROPLASTY;  Surgeon: [name], MD;  [facility];  Service: Orthopedics;  Laterality: Right;  . TOTAL KNEE ARTHROPLASTY Left 01/10/2017   Procedure: LEFT TOTAL KNEE ARTHROPLASTY;  Surgeon: [name], MD;  [facility];  Service: Orthopedics;  Laterality: Left;  . TOTAL SHOULDER ARTHROPLASTY Right 01/23/2018   Procedure: RIGHT REVERSE TOTAL SHOULDER ARTHROPLASTY;  Surgeon: Meredith Pel, MD;  Location: [place];  Service: Orthopedics;  Laterality: Right;   Past Surgical History:  Procedure Laterality Date  . CARDIOVERSION N/A 08/14/2018   Procedure: CARDIOVERSION;  Surgeon: Nigel Mormon, MD;  Location: [place] [facility];  Service: Cardiovascular;  Laterality: N/A;  . COLONOSCOPY  06/14/2010  . CORONARY ANGIOGRAPHY N/A 07/11/2016   Procedure: Coronary Angiography;  Surgeon: Dixie Dials, MD;  Location: [place] [facility];  Service: Cardiovascular;  Laterality: N/A;  . CYSTOSCOPY W/ RETROGRADES Bilateral 10/03/2015   Procedure: CYSTOSCOPY WITH RETROGRADE PYELOGRAM;  Surgeon: [name], MD;  Location: [facility];  Service: Urology;  Laterality: Bilateral;  . GREEN LIGHT LASER TURP (TRANSURETHRAL RESECTION OF PROSTATE N/A 10/03/2015   Procedure: GREEN LIGHT LASER,  TURP - STAGED(TRANSURETHRAL RESECTION OF PROSTATE;  Surgeon: [name], MD;  Location: [facility];  Service: Urology;  Laterality: N/A;  . INTRAVASCULAR PRESSURE WIRE/FFR STUDY N/A 07/14/2018   Procedure: INTRAVASCULAR PRESSURE WIRE/FFR STUDY;  Surgeon: Nigel Mormon, MD;  Location: [place] [facility];  Service: Cardiovascular;  Laterality: N/A;  . KNEE ARTHROSCOPY     not sure which knee  . LEFT HEART CATH AND CORONARY ANGIOGRAPHY N/A 07/14/2018   Procedure: LEFT HEART CATH AND CORONARY ANGIOGRAPHY;  Surgeon: Nigel Mormon, MD;  Location: [place] [facility];  Service: Cardiovascular;  Laterality: N/A;  . QUADRICEPS TENDON REPAIR Bilateral left 02/24/2003/   right 2006  . TOTAL KNEE ARTHROPLASTY Right 12/01/2015   Procedure: RIGHT TOTAL KNEE ARTHROPLASTY;  Surgeon: [name], MD;  [facility];  Service: Orthopedics;  Laterality: Right;  . TOTAL KNEE ARTHROPLASTY Left 01/10/2017   Procedure: LEFT TOTAL KNEE ARTHROPLASTY;  Surgeon: [name], MD;  [facility];  Service: Orthopedics;  Laterality: Left;  . TOTAL SHOULDER ARTHROPLASTY Right 01/23/2018   Procedure: RIGHT REVERSE TOTAL SHOULDER ARTHROPLASTY;  Surgeon: Meredith Pel, MD;  Location: [place];  Service: Orthopedics;  Laterality: Right;   Past Medical History:  Diagnosis Date  . Arthritis    knees  . Bladder spasms   . BPH (benign prostatic hyperplasia)   . DVT (deep venous thrombosis) ([place]) ocyt 2016   left leg tx medically  . GERD (gastroesophageal reflux disease)   . History of colon polyps    hyperplastic 06-14-2010  .  Hydronephrosis   . Hypertension   . Obesity, morbid ([place])   . Urinary retention    BP 135/79   Pulse 83   Temp 99.2 F (37.3 C)   Ht 5\' 11"  (1.803 m)   Wt (!) 325 lb (147.4 kg)   SpO2 90%   BMI 45.33 kg/m   Opioid Risk Score:   Fall Risk Score:  `1  Depression screen PHQ 2/9  Depression screen [facility] 2/9 09/24/2019 05/17/2019  Decreased Interest 1 1  Down, Depressed, Hopeless 1 0  PHQ - 2 Score 2 1  Altered sleeping - 3  Tired, decreased energy - 3  Change in appetite - 0  Feeling bad or failure about yourself  - 0  Trouble concentrating - 0  Moving slowly or fidgety/restless - 0  Suicidal thoughts - 0  PHQ-9 Score - 7  Difficult doing work/chores - Somewhat difficult    Review of Systems  Constitutional: Negative.   HENT: Negative.   Eyes: Negative.   Respiratory: Negative.   Cardiovascular: Negative.   Gastrointestinal: Negative.   Endocrine: Negative.    Genitourinary: Negative.   Musculoskeletal: Positive for back pain.  Skin: Negative.   Allergic/Immunologic: Negative.   Neurological: Negative.   Hematological: Negative.   Psychiatric/Behavioral: Negative.   All other systems reviewed and are negative.      Objective:   Physical Exam Awake, alert, appropriate, but leaned over at lumbar spine/flexed- and cannot get comfortable- keeps moving, NAD  MRI 2020- Lumbar Lumbar spine spondylosis most notable at L5-S1 with a tiny central disc protrusion which contacts the bilateral descending S1 nerve roots. There is moderate left neural foraminal narrowing. This is slightly progressed since prior exam.       Assessment & Plan:    Pt is a 65 yr old male with chronic low back pain with herniated disc and likely neurogenic claudication as well as new onset B/L hind numbness with progressive neck pain-   1. Refer to Dr Letta Pate to do radiofrequency ablaltion if possible. FYI, had last MRI 02/27/20 at [facility]- report in [place].  MRI report 02/27/20-  Degenerative changes:   T12-L1: No substantial canal or foraminal stenosis.  L1-L2: No substantial canal or foraminal stenosis.  L2-L3: No substantial canal or foraminal stenosis.  L3-L4: Mild facet hypertrophy and ligamentum flavum thickening without substantial canal or foraminal stenosis.  L4-L5: Broad-based disc bulge, moderate facet hypertrophy, and ligamentum flavum thickening contribute to mild narrowing of the bilateral subarticular zones and neural foramina without substantial canal stenosis.Bilateral facet joint effusions.  L5-S1: Mild broad-based disc bulge, mild facet hypertrophy, and ligamentum flavum thickening without substantial canal or foraminal stenosis.   Upper Sacrum: No focal lesion identified.      2. No pain meds did ANYTHING- the only medicine that could tell did anything was Percocet 10/325- but only made him high and didn't treat pain.  Has failed: Dilaudid, Methadone, Lyrica, Keprra, Percocet, Norco, Duloxetine, Low dose naltrexone;  3. If this isn't effective, think spinal cord stimulator or ketamine infusions might be the only choice.   4. Will speak to Dr Letta Pate about the plan.   5. F/U after sees Dr Letta Pate.   6. Needs to check if can come off Eliquis for Afib- double with PCP/Cardiologist- to see if can come off for procedure.  Usually 5 days prior and a few days after.   7. Placed ENT referral for Vertigo/dizizness and L hearing loss.  8. No meds, since nothing has helped.  I spent a total of 25 minutes on visit- as detailed above.

## 2020-03-24 NOTE — Patient Instructions (Signed)
Pt is a 65 yr old male with chronic low back pain with herniated disc and likely neurogenic claudication as well as new onset B/L hind numbness with progressive neck pain-   1. Refer to Dr Letta Pate to do radiofrequency ablaltion if possible. FYI, had last MRI 02/27/20 at [facility]- report in [place].  MRI report 02/27/20-  Degenerative changes:   T12-L1: No substantial canal or foraminal stenosis.  L1-L2: No substantial canal or foraminal stenosis.  L2-L3: No substantial canal or foraminal stenosis.  L3-L4: Mild facet hypertrophy and ligamentum flavum thickening without substantial canal or foraminal stenosis.  L4-L5: Broad-based disc bulge, moderate facet hypertrophy, and ligamentum flavum thickening contribute to mild narrowing of the bilateral subarticular zones and neural foramina without substantial canal stenosis.Bilateral facet joint effusions.  L5-S1: Mild broad-based disc bulge, mild facet hypertrophy, and ligamentum flavum thickening without substantial canal or foraminal stenosis.   Upper Sacrum: No focal lesion identified.      2. No pain meds did ANYTHING- the only medicine that could tell did anything was Percocet 10/325- but only made him high and didn't treat pain. Has failed: Dilaudid, Methadone, Lyrica, Keprra, Percocet, Norco, Duloxetine, Low dose naltrexone;  3. If this isn't effective, think spinal cord stimulator or ketamine infusions might be the only choice.   4. Will speak to Dr Letta Pate about the plan.   5. F/U after sees Dr Letta Pate.  6. Needs to check if can come off Eliquis for Afib- double with PCP/Cardiologist- to see if can come off for procedure.  Usually 5 days prior and a few days after.   7. Placed ENT referral for Vertigo/dizizness and L hearing loss.

## 2020-04-17 ENCOUNTER — Ambulatory Visit: Payer: Medicare Other | Admitting: Physical Medicine and Rehabilitation

## 2020-04-18 ENCOUNTER — Encounter: Payer: Medicare Other | Attending: Physical Medicine and Rehabilitation | Admitting: Physical Medicine & Rehabilitation

## 2020-04-18 ENCOUNTER — Encounter: Payer: Self-pay | Admitting: Physical Medicine & Rehabilitation

## 2020-04-18 ENCOUNTER — Other Ambulatory Visit: Payer: Self-pay

## 2020-04-18 VITALS — BP 134/78 | HR 76 | Temp 98.2°F | Ht 71.0 in | Wt 324.2 lb

## 2020-04-18 DIAGNOSIS — M47816 Spondylosis without myelopathy or radiculopathy, lumbar region: Secondary | ICD-10-CM

## 2020-04-18 NOTE — Patient Instructions (Signed)

## 2020-04-18 NOTE — Progress Notes (Signed)
  [facility]   Name: MARKEVION LATTIN DOB:1955/12/03 MRN: [id number]  Date:04/18/2020  Physician: Alysia Penna, MD    Nurse/CMA: Jorja Loa MA  Allergies:  Allergies  Allergen Reactions  . Atorvastatin Other (See Comments)    Muscle fatigue, soreness and dreams crazy Other reaction(s): severe arthralgias/myalgias  . Aspirin Other (See Comments)    Avoid to kidney function Other reaction(s): **Reduced Kidney Function Avoid to kidney function   . Simvastatin Other (See Comments)    Fatigue, soreness  . Statins Other (See Comments)    Fatigue, soreness    Consent Signed: Yes.    Is patient diabetic? No.  CBG today? N/A Pregnant: No. LMP: No LMP for male patient. (age [age])  Anticoagulants: yes (Eliquis taken today) Anti-inflammatory: no Antibiotics: no  Procedure:Medial Branch Block RT L345 Position: Prone Start Time:10:09AM End Time: 10:20CAM Fluoro Time:24  RN/CMA Adya Wirz MA Kweli Grassel MA    Time 9:47 AM 10:24AM    BP 134/78 130/80    Pulse 76 73    Respirations 16 16    O2 Sat 95 95    S/S 6 6    Pain Level 10/10 0/10     D/C home with Wife, patient A & O X 3, D/C instructions reviewed, and sits independently.

## 2020-04-18 NOTE — Progress Notes (Signed)
Right lumbar L3, L4 medial branch blocks and L5 dorsal ramus injection under fluoroscopic guidance  Indication: Right Lumbar pain which is not relieved by medication management or other conservative care and interfering with self-care and mobility.  Informed consent was obtained after describing risks and benefits of the procedure with the patient, this includes bleeding, bruising, infection, paralysis and medication side effects. The patient wishes to proceed and has given written consent. The patient was placed in a prone position. The lumbar area was marked and prepped with Betadine. One ML of 1% lidocaine was injected into each of 3 areas into the skin and subcutaneous tissue. Then a 22-gauge 5" spinal needle was inserted targeting the junction of the Right S1 superior articular process and sacral ala junction. Needle was advanced under fluoroscopic guidance. Bone contact was made.Isovue 200 was injected x0.5 mL demonstrating no intravascular uptake. Then a solution containing 2% MPF lidocaine was injected x0.5 mL. Then the Right L5 superior articular process in transverse process junction was targeted. Bone contact was made.Isovue 200 was injected x0.5 mL demonstrating no intravascular uptake. Then a solution containing 2% MPF lidocaine was injected x0.5 mL. Then the Right L4 superior articular process in transverse process junction was targeted. Bone contact was made. Isovue 200 was injected x0.5 mL demonstrating no intravascular uptake. Then a solution containing2% MPF lidocaine was injected x0.5 mL Patient tolerated procedure well. Post procedure instructions were given. Please refer to post procedure form.  Preinj R side low back pain 10/10 Post injection R side low back pain 0/10  When pain returns would schedule for Repeat R L3-4-5 MBB, premedicate with 10mg  valium po prior to injection, needs driver, if second set of MBBs relieve at least 50% pain, would proceed to Right L3-4-5 RF

## 2020-04-24 ENCOUNTER — Telehealth: Payer: Self-pay

## 2020-04-24 NOTE — Telephone Encounter (Signed)
Per [facility] repeat procedure please use valium protocol

## 2020-04-26 MED ORDER — DIAZEPAM 10 MG PO TABS
ORAL_TABLET | ORAL | 0 refills | Status: DC
Start: 2020-04-26 — End: 2020-05-29

## 2020-04-26 NOTE — Telephone Encounter (Signed)
Pre med diazepam 10 mg called to [facility] per protocol. He must have a driver for procedure. I have let Stephen Maldonado know.

## 2020-04-28 ENCOUNTER — Encounter: Payer: Medicare Other | Admitting: Physical Medicine and Rehabilitation

## 2020-05-02 ENCOUNTER — Other Ambulatory Visit: Payer: Self-pay

## 2020-05-02 ENCOUNTER — Encounter: Payer: Self-pay | Admitting: Physical Medicine & Rehabilitation

## 2020-05-02 ENCOUNTER — Encounter (HOSPITAL_BASED_OUTPATIENT_CLINIC_OR_DEPARTMENT_OTHER): Payer: Medicare Other | Admitting: Physical Medicine & Rehabilitation

## 2020-05-02 VITALS — BP 147/88 | HR 72 | Temp 98.7°F | Ht 71.0 in | Wt 323.6 lb

## 2020-05-02 DIAGNOSIS — M47816 Spondylosis without myelopathy or radiculopathy, lumbar region: Secondary | ICD-10-CM | POA: Diagnosis not present

## 2020-05-02 NOTE — Progress Notes (Signed)
Right lumbar L3, L4 medial branch blocks and L5 dorsal ramus injection under fluoroscopic guidance  Indication: Right Lumbar pain which is not relieved by medication management or other conservative care and interfering with self-care and mobility.  Informed consent was obtained after describing risks and benefits of the procedure with the patient, this includes bleeding, bruising, infection, paralysis and medication side effects. The patient wishes to proceed and has given written consent. The patient was placed in a prone position. The lumbar area was marked and prepped with Betadine. One ML of 1% lidocaine was injected into each of 3 areas into the skin and subcutaneous tissue. Then a 22-gauge 5" spinal needle was inserted targeting the junction of the Right S1 superior articular process and sacral ala junction. Needle was advanced under fluoroscopic guidance. Bone contact was made.Isovue 200 was injected x0.5 mL demonstrating no intravascular uptake. Then a solution containing 2% MPF lidocaine was injected x0.5 mL. Then the Right L5 superior articular process in transverse process junction was targeted. Bone contact was made.Isovue 200 was injected x0.5 mL demonstrating no intravascular uptake. Then a solution containing 2% MPF lidocaine was injected x0.5 mL. Then the Right L4 superior articular process in transverse process junction was targeted. Bone contact was made. Isovue 200 was injected x0.5 mL demonstrating no intravascular uptake. Then a solution containing2% MPF lidocaine was injected x0.5 mL Patient tolerated procedure well. Post procedure instructions were given. Please refer to post procedure form.  Preinj R side low back pain 8/10 Post injection R side low back pain 2/10  second set of MBBs relieved > 50% pain, would proceed to Right L3-4-5 RF

## 2020-05-02 NOTE — Progress Notes (Addendum)
  [facility]   Name: MEKEL HAVERSTOCK DOB:1955-02-08 MRN: [id number]  Date:05/02/2020  Physician: Alysia Penna, MD    Nurse/CMA: Jorja Loa MA  Allergies:  Allergies  Allergen Reactions  . Atorvastatin Other (See Comments)    Muscle fatigue, soreness and dreams crazy Other reaction(s): severe arthralgias/myalgias  . Aspirin Other (See Comments)    Avoid to kidney function Other reaction(s): **Reduced Kidney Function Avoid to kidney function   . Simvastatin Other (See Comments)    Fatigue, soreness  . Statins Other (See Comments)    Fatigue, soreness    Consent Signed: Yes.    Is patient diabetic? No.  CBG today? N/A Pregnant: No. LMP: No LMP for male patient. (age 66-55)  Anticoagulants: Eliquis Anti-inflammatory: no Antibiotics: no  Procedure:L3,4,5 Medial Branch Block  Position: Prone Start Time:11:38am  End Time: 11:45am Fluoro Time:31  RN/CMA Jerline Pain MA  Timia Casselman MA    Time 11:25 am 11:49    BP 147/88 153/79    Pulse 72 74    Respirations 16 16    O2 Sat 95 94    S/S 6 6    Pain Level 9/10 2/10     D/C home with his Wife, patient A & O X 3, D/C instructions reviewed, and sits independently.

## 2020-05-02 NOTE — Patient Instructions (Signed)

## 2020-05-29 ENCOUNTER — Other Ambulatory Visit: Payer: Self-pay | Admitting: *Deleted

## 2020-05-29 MED ORDER — DIAZEPAM 10 MG PO TABS: ORAL_TABLET | ORAL | 0 refills | Status: DC

## 2020-05-30 ENCOUNTER — Encounter: Payer: Self-pay | Admitting: Physical Medicine & Rehabilitation

## 2020-05-30 ENCOUNTER — Encounter: Payer: Medicare Other | Attending: Physical Medicine and Rehabilitation | Admitting: Physical Medicine & Rehabilitation

## 2020-05-30 ENCOUNTER — Other Ambulatory Visit: Payer: Self-pay

## 2020-05-30 VITALS — Temp 98.6°F | Ht 71.0 in | Wt 327.0 lb

## 2020-05-30 DIAGNOSIS — M47816 Spondylosis without myelopathy or radiculopathy, lumbar region: Secondary | ICD-10-CM | POA: Diagnosis present

## 2020-05-30 NOTE — Patient Instructions (Signed)
You had a radio frequency procedure today This was done to alleviate joint pain in your lumbar area We injected lidocaine which is a local anesthetic.  You may experience soreness at the injection sites. You may also experienced some irritation of the nerves that were heated I'm recommending ice for 30 minutes every 2 hours as needed for the next 24-48 hours   

## 2020-05-30 NOTE — Progress Notes (Signed)
  [facility]   Name: Stephen Maldonado DOB:[birth year] MRN: [id number]  Date:05/30/2020  Physician: Alysia Penna, MD    Nurse/CMA: Rilley Poulter, CMA   Allergies:  Allergies  Allergen Reactions  . Atorvastatin Other (See Comments)    Muscle fatigue, soreness and dreams crazy Other reaction(s): severe arthralgias/myalgias  . Aspirin Other (See Comments)    Avoid to kidney function Other reaction(s): **Reduced Kidney Function Avoid to kidney function   . Simvastatin Other (See Comments)    Fatigue, soreness  . Statins Other (See Comments)    Fatigue, soreness    Consent Signed: Yes.    Is patient diabetic? No.  CBG today?   Pregnant: No. LMP: No LMP for male patient. (age 31-55)  Anticoagulants: no Anti-inflammatory: no Antibiotics: no  Procedure: right L3,4,5   Radiofrequency ablation  Position: Prone Start Time: 2:41pm  End Time:  Fluoro Time:   RN/CMA Cabot Cromartie, CMA Kiasia Chou, CMA    Time 2:25pm     BP 147/81     Pulse 80     Respirations 16 16    O2 Sat 90     S/S 6 6    Pain Level 8/10      D/C home with wife, patient A & O X 3, D/C instructions reviewed, and sits independently.

## 2020-05-30 NOTE — Progress Notes (Signed)
RightL5 dorsal ramus., Right L4 and Right L3 medial branch radio frequency neurotomy under fluoroscopic guidance   Indication: Low back pain due to lumbar spondylosis which has been relieved on 2 occasions by greater than 50% by lumbar medial branch blocks at corresponding levels.  Informed consent was obtained after describing risks and benefits of the procedure with the patient, this includes bleeding, bruising, infection, paralysis and medication side effects. The patient wishes to proceed and has given written consent. The patient was placed in a prone position. The lumbar and sacral area was marked and prepped with Betadine. A 25-gauge 1-1/2 inch needle was inserted into the skin and subcutaneous tissue at 3 sites in one ML of 1% lidocaine was injected into each site. Then a 18-gauge 15 cm radio frequency needle with a 1 cm curved active tip was inserted targeting the Right S1 SAP/sacral ala junction. Bone contact was made and confirmed with lateral imaging.  motor stimulation at 2 Hz confirm proper needle location followed by injection of 46m 2% MPF lidocaine. Then the Right L5 SAP/transverse process junction was targeted. Bone contact was made and confirmed with lateral imaging.  motor stimulation at 2 Hz confirm proper needle location followed by injection of 145m2% MPF lidocaine. Then the Right L4 SAP/transverse process junction was targeted. Bone contact was made and confirmed with lateral imaging. motor stimulation at 2 Hz confirm proper needle location followed by injection of 63m9m% MPF lidocaine. Radio frequency [facility] 90 seconds was performed. Needles were removed. Post procedure instructions and vital signs were performed. Patient tolerated procedure well. Followup appointment was given.

## 2020-07-14 ENCOUNTER — Other Ambulatory Visit: Payer: Self-pay

## 2020-07-14 ENCOUNTER — Encounter: Payer: Self-pay | Admitting: Physical Medicine and Rehabilitation

## 2020-07-14 ENCOUNTER — Encounter
Payer: Medicare Other | Attending: Physical Medicine and Rehabilitation | Admitting: Physical Medicine and Rehabilitation

## 2020-07-14 VITALS — BP 140/86 | HR 91 | Temp 98.6°F | Ht 71.0 in | Wt 327.8 lb

## 2020-07-14 DIAGNOSIS — G894 Chronic pain syndrome: Secondary | ICD-10-CM | POA: Insufficient documentation

## 2020-07-14 DIAGNOSIS — R269 Unspecified abnormalities of gait and mobility: Secondary | ICD-10-CM | POA: Insufficient documentation

## 2020-07-14 DIAGNOSIS — M5416 Radiculopathy, lumbar region: Secondary | ICD-10-CM | POA: Diagnosis present

## 2020-07-14 NOTE — Patient Instructions (Signed)
Pt is a 65 yr old male with chronic low back pain with herniated disc and likely neurogenic claudication as well as new onset B/L hind numbness with progressive neck pain-   Here for chronic low back pain. Ketamine infusions?    Will send referral to Dr Mechele Dawley- a pain mgmt [phone] is the number- called Dr Mechele Dawley -will get him in.   2. Pt doesn't have redg signs - just increased pain without response to R L3,4 and R L5 dorsal ramus radiofrequency ablation- lasted 1 week.   3. Cannot give Indomethciin or "strong NSAIDs" because kidney would shut down. Cr 2.0.   4.  Have tried Nucynta, Dilaudid, Methadone, has also tried Oxycodone, Norco,  no results- except causing constipation- has also tried muscle relaxants, as well as Low dose naltrexone with no results as well.   5. F/U- as needed

## 2020-07-14 NOTE — Progress Notes (Signed)
Subjective:    Patient ID: Stephen Maldonado, male    DOB: 1955-04-19, 65 y.o.   MRN: [id number]  HPI  Pt is a 65 yr old male with chronic low back pain with herniated disc and likely neurogenic claudication as well as new onset B/L hind numbness with progressive neck pain-  Here for chronic back pain.   Pain has been worse this last week.   Injections only lasted a couple of days.  Radiofrequency ablation helped right then, but not past 1 week.   Pain is now grabbing, sharp pain. All on R side.  Sitting not even comfortable anymore.  No fall or anything trauma related.   Bowel and bladder are working OK. No new issues with that.   Toes went to sleep on right foot doing dishes today- has pain in R low back it radiates down RLE to toes.         Pain Inventory Average Pain 10 Pain Right Now 10 My pain is constant and sharp  In the last 24 hours, has pain interfered with the following? General activity 9 Relation with others 6 Enjoyment of life 10 What TIME of day is your pain at its worst? morning , daytime, evening, and night Sleep (in general) Fair  Pain is worse with: walking, bending, sitting, standing, and some activites Pain improves with:  nothing Relief from Meds: 1  Family History  Problem Relation Age of Onset   CAD Mother    Lung cancer Mother    Breast cancer Mother    Diabetes Mother    Heart failure Father    CAD Father    Diabetes Father    Social History   Socioeconomic History   Marital status: Married    Spouse name: Not on file   Number of children: 1   Years of education: Not on file   Highest education level: Not on file  Occupational History   Not on file  Tobacco Use   Smoking status: Never   Smokeless tobacco: Former    Types: Chew    Quit date: 02/05/1968  Vaping Use   Vaping Use: Never used  Substance and Sexual Activity   Alcohol use: Yes    Comment: 1-2 beers days   Drug use: No   Sexual activity: Not Currently  Other  Topics Concern   Not on file  Social History Narrative   Not on file   Social Determinants of Health   Financial Resource Strain: Not on file  Food Insecurity: Not on file  Transportation Needs: Not on file  Physical Activity: Not on file  Stress: Not on file  Social Connections: Not on file   Past Surgical History:  Procedure Laterality Date   CARDIOVERSION N/A 08/14/2018   Procedure: CARDIOVERSION;  Surgeon: Nigel Mormon, MD;  Location: [facility];  Service: Cardiovascular;  Laterality: N/A;   COLONOSCOPY  06/14/2010   CORONARY ANGIOGRAPHY N/A 07/11/2016   Procedure: Coronary Angiography;  Surgeon: Dixie Dials, MD;  Location: [place] [facility];  Service: Cardiovascular;  Laterality: N/A;   CYSTOSCOPY W/ RETROGRADES Bilateral 10/03/2015   Procedure: CYSTOSCOPY WITH RETROGRADE PYELOGRAM;  Surgeon: [name], MD;  Location: [facility];  Service: Urology;  Laterality: Bilateral;   GREEN LIGHT LASER TURP (TRANSURETHRAL RESECTION OF PROSTATE N/A 10/03/2015   Procedure: GREEN LIGHT LASER,  TURP - STAGED(TRANSURETHRAL RESECTION OF PROSTATE;  Surgeon: [name], MD;  Location: [facility];  Service: Urology;  Laterality:  N/A;   INTRAVASCULAR PRESSURE WIRE/FFR STUDY N/A 07/14/2018   Procedure: INTRAVASCULAR PRESSURE WIRE/FFR STUDY;  Surgeon: Nigel Mormon, MD;  Location: [place] [facility];  Service: Cardiovascular;  Laterality: N/A;   KNEE ARTHROSCOPY     not sure which knee   LEFT HEART CATH AND CORONARY ANGIOGRAPHY N/A 07/14/2018   Procedure: LEFT HEART CATH AND CORONARY ANGIOGRAPHY;  Surgeon: Nigel Mormon, MD;  Location: [place] [facility];  Service: Cardiovascular;  Laterality: N/A;   QUADRICEPS TENDON REPAIR Bilateral left 02/24/2003/   right 2006   TOTAL KNEE ARTHROPLASTY Right 12/01/2015   Procedure: RIGHT TOTAL KNEE ARTHROPLASTY;  Surgeon: [name], MD;  Location: [facility];  Service: Orthopedics;   Laterality: Right;   TOTAL KNEE ARTHROPLASTY Left 01/10/2017   Procedure: LEFT TOTAL KNEE ARTHROPLASTY;  Surgeon: [name], MD;  Location: [facility];  Service: Orthopedics;  Laterality: Left;   TOTAL SHOULDER ARTHROPLASTY Right 01/23/2018   Procedure: RIGHT REVERSE TOTAL SHOULDER ARTHROPLASTY;  Surgeon: Meredith Pel, MD;  Location: [facility];  Service: Orthopedics;  Laterality: Right;   Past Surgical History:  Procedure Laterality Date   CARDIOVERSION N/A 08/14/2018   Procedure: CARDIOVERSION;  Surgeon: Nigel Mormon, MD;  Location: [facility] [place];  Service: Cardiovascular;  Laterality: N/A;   COLONOSCOPY  06/14/2010   CORONARY ANGIOGRAPHY N/A 07/11/2016   Procedure: Coronary Angiography;  Surgeon: Dixie Dials, MD;  Location: [place] [facility];  Service: Cardiovascular;  Laterality: N/A;   CYSTOSCOPY W/ RETROGRADES Bilateral 10/03/2015   Procedure: CYSTOSCOPY WITH RETROGRADE PYELOGRAM;  Surgeon: [name], MD;  Location: [facility];  Service: Urology;  Laterality: Bilateral;   GREEN LIGHT LASER TURP (TRANSURETHRAL RESECTION OF PROSTATE N/A 10/03/2015   Procedure: GREEN LIGHT LASER,  TURP - STAGED(TRANSURETHRAL RESECTION OF PROSTATE;  Surgeon: [name], MD;  Location: [facility];  Service: Urology;  Laterality: N/A;   INTRAVASCULAR PRESSURE WIRE/FFR STUDY N/A 07/14/2018   Procedure: INTRAVASCULAR PRESSURE WIRE/FFR STUDY;  Surgeon: Nigel Mormon, MD;  Location: [place] [facility];  Service: Cardiovascular;  Laterality: N/A;   KNEE ARTHROSCOPY     not sure which knee   LEFT HEART CATH AND CORONARY ANGIOGRAPHY N/A 07/14/2018   Procedure: LEFT HEART CATH AND CORONARY ANGIOGRAPHY;  Surgeon: Nigel Mormon, MD;  Location: [place] [facility];  Service: Cardiovascular;  Laterality: N/A;   QUADRICEPS TENDON REPAIR Bilateral left 02/24/2003/   right 2006   TOTAL KNEE ARTHROPLASTY Right 12/01/2015   Procedure: RIGHT TOTAL KNEE  ARTHROPLASTY;  Surgeon: [name], MD;  Location: [facility];  Service: Orthopedics;  Laterality: Right;   TOTAL KNEE ARTHROPLASTY Left 01/10/2017   Procedure: LEFT TOTAL KNEE ARTHROPLASTY;  Surgeon: [name], MD;  Location: [facility];  Service: Orthopedics;  Laterality: Left;   TOTAL SHOULDER ARTHROPLASTY Right 01/23/2018   Procedure: RIGHT REVERSE TOTAL SHOULDER ARTHROPLASTY;  Surgeon: Meredith Pel, MD;  Location: [place];  Service: Orthopedics;  Laterality: Right;   Past Medical History:  Diagnosis Date   Arthritis    knees   Bladder spasms    BPH (benign prostatic hyperplasia)    DVT (deep venous thrombosis) ([place]) ocyt 2016   left leg tx medically   GERD (gastroesophageal reflux disease)    History of colon polyps    hyperplastic 06-14-2010   Hydronephrosis    Hypertension    Obesity, morbid (HCC)    Urinary retention    BP 140/86   Pulse 91  Temp 98.6 F (37 C)   Ht 5\' 11"  (1.803 m)   Wt (!) 327 lb 12.8 oz (148.7 kg)   BMI 45.72 kg/m   Opioid Risk Score:   Fall Risk Score:  `1  Depression screen PHQ 2/9  Depression screen [facility] 2/9 05/02/2020 09/24/2019 05/17/2019  Decreased Interest 1 1 1   Down, Depressed, Hopeless 1 1 0  PHQ - 2 Score 2 2 1   Altered sleeping - - 3  Tired, decreased energy - - 3  Change in appetite - - 0  Feeling bad or failure about yourself  - - 0  Trouble concentrating - - 0  Moving slowly or fidgety/restless - - 0  Suicidal thoughts - - 0  PHQ-9 Score - - 7  Difficult doing work/chores - - Somewhat difficult    Review of Systems  Musculoskeletal:  Positive for gait problem.  All other systems reviewed and are negative.     Objective:   Physical Exam  Pt awake, alert, appropriate, sitting on table, but moaning and groaning, which is unusual for him, NAD Very tight trigger point sin paraspinals, but most of tenderness/pain coming from midline       Assessment & Plan:    Pt is a 65 yr old male with chronic  low back pain with herniated disc and likely neurogenic claudication as well as new onset B/L hind numbness with progressive neck pain-   Here for chronic low back pain. Ketamine infusions?    Will send referral to Dr Mechele Dawley- a pain mgmt [phone] is the number- called Dr Mechele Dawley -will get him in.   2. Pt doesn't have redg signs - just increased pain without response to R L3,4 and R L5 dorsal ramus radiofrequency ablation- lasted 1 week.   3. Cannot give Indomethciin or "strong NSAIDs" because kidney would shut down. Cr 2.0.   4.  Have tried Nucynta, Dilaudid, Methadone, has also tried Oxycodone, Norco,  no results- except causing constipation- has also tried muscle relaxants, as well as Low dose naltrexone with no results as well.   5. F/U- as needed  I spent a total of 25 minutes on appointment- as detailed above.

## 2020-08-24 ENCOUNTER — Encounter: Payer: Self-pay | Admitting: Cardiology

## 2020-08-24 ENCOUNTER — Other Ambulatory Visit: Payer: Self-pay

## 2020-08-24 ENCOUNTER — Ambulatory Visit: Payer: Medicare Other | Admitting: Cardiology

## 2020-08-24 VITALS — BP 157/82 | HR 79 | Temp 98.0°F | Resp 16 | Ht 71.0 in | Wt 324.0 lb

## 2020-08-24 DIAGNOSIS — R072 Precordial pain: Secondary | ICD-10-CM

## 2020-08-24 DIAGNOSIS — I5022 Chronic systolic (congestive) heart failure: Secondary | ICD-10-CM

## 2020-08-24 DIAGNOSIS — I480 Paroxysmal atrial fibrillation: Secondary | ICD-10-CM

## 2020-08-24 MED ORDER — ISOSORBIDE MONONITRATE ER 30 MG PO TB24: 30.0000 mg | ORAL_TABLET | Freq: Every day | ORAL | 3 refills | Status: DC

## 2020-08-24 NOTE — Progress Notes (Signed)
Follow up visit  Subjective:   Stephen Maldonado, male    DOB: [birth year], 65 y.o.   MRN: [id number]    Chief Complaint  Patient presents with   Chronic systolic heart failure    Atrial Fibrillation   Shortness of Breath   Coronary Artery Disease   Follow-up    65 y.o. Caucasian male with hypertension, hyperlipidemia, nonischemic cardiomyopathy, paroxysmal atrial fibrillation, morbid obesity  Patient last saw me in 2020. In the last few days, he has had retrosternal chest tightness, unrelated to exertion. He has not had any worsening exertional dyspnea. Leg edema persists. He is using CPAP for OSA and tolerating it well.    Current Outpatient Medications on File Prior to Visit  Medication Sig Dispense Refill   acetaZOLAMIDE (DIAMOX) 250 MG tablet Take 250 mg by mouth 2 (two) times daily.     apixaban (ELIQUIS) 5 MG TABS tablet Take 1 tablet by mouth 2 (two) times daily.     carvedilol (COREG) 6.25 MG tablet TAKE 1 TABLET (6.25 MG TOTAL) BY MOUTH 2 (TWO) TIMES DAILY WITH A MEAL. (Patient taking differently: Take 6.25 mg by mouth daily.) 180 tablet 1   Cholecalciferol (VITAMIN D) 50 MCG (2000 UT) tablet Take 2,000 Units by mouth daily.     Coenzyme Q10 (COQ-10) 200 MG CAPS Take 200 mg by mouth daily.     cyclobenzaprine (FLEXERIL) 10 MG tablet      diazepam (VALIUM) 10 MG tablet Take one tablet po 30 minutes prior to procedure.  Must have a driver. 1 tablet 0   diltiazem (TIAZAC) 240 MG 24 hr capsule Take by mouth.     ELIQUIS 5 MG TABS tablet TAKE 1 TABLET BY MOUTH TWICE A DAY 60 tablet 3   furosemide (LASIX) 80 MG tablet Take 1 tablet (80 mg total) by mouth 2 (two) times daily. 90 tablet 2   Garlic 2751 MG CAPS Take 1,000 mg by mouth daily.     hydrALAZINE (APRESOLINE) 25 MG tablet Take 25 mg by mouth 2 (two) times daily with a meal.  3   HYDROcodone-acetaminophen (NORCO/VICODIN) 5-325 MG tablet Take 1 tablet by mouth every 6 (six) hours as needed.     Ketotifen Fumarate (ALLERGY  EYE DROPS OP) Place 1 drop into both eyes daily as needed (allergies).     metolazone (ZAROXOLYN) 5 MG tablet 1 tablet     metoprolol tartrate (LOPRESSOR) 25 MG tablet metoprolol tartrate 25 mg tablet  TAKE 1/2 TABLET BY MOUTH WITH FOOD TWICE A DAY     naphazoline-pheniramine (ALLERGY EYE) 0.025-0.3 % ophthalmic solution Place 1 drop into both eyes 4 (four) times daily as needed for eye irritation.     NUCYNTA ER 150 MG TB12 Take 1 tablet by mouth 2 (two) times daily.     Omega-3 Fatty Acids (FISH OIL) 1000 MG CAPS Take 1,000 mg by mouth daily.      Potassium Chloride ER 20 MEQ TBCR Take 20 mEq by mouth daily. with food     tamsulosin (FLOMAX) 0.4 MG CAPS capsule Take 0.4 mg by mouth every morning.     Turmeric 500 MG TABS Take 500 mg by mouth daily.     vitamin B-12 (CYANOCOBALAMIN) 1000 MCG tablet Take 1,000 mcg by mouth daily.     vitamin C (ASCORBIC ACID) 500 MG tablet Take 500 mg by mouth daily.      losartan (COZAAR) 25 MG tablet Take 1 tablet (25 mg total) by mouth daily. [place]  tablet 1   No current facility-administered medications on file prior to visit.    Cardiovascular studies:  EKG 08/24/2020: Sinus rhythm 77 bpm  Leftward axis Old anteroseptal infarct  Echocardiogram 12/25/2018: Left ventricle cavity is normal in size. Severe concentric hypertrophy of the left ventricle. Moderately depressed LV systolic function with moderate global hypokinesis. LVEF 35-40%. Doppler evidence of grade I (impaired) diastolic dysfunction, normal LAP. Calculated EF 38%. Left atrial cavity is moderately dilated.. Probably trileaflet aortic valve with mild aortic valve leaflet calcification. Mild aortic stenosis. Aortic valve mean gradient of 10 mmHg, Vmax of 2.0  m/s. Calculated aortic valve area by continuity equation is 1.5 cm.  No regurgitation noted. IVC is dilated with a respiratory response of >50%. Estimated RA pressure 8 mmHg. Compared to previous study on 07/06/2018, LVEF improved from 20%  to 35-40%.   EKG 08/24/2018: Sinus rhythm 86 bpm.  Left atrial enlargement.  EKG 08/10/2018: Atrial fibrillation with RVR. No significant change compared to previous EKG.   Left/right heart cath 07/14/2018: LM: Normal LAD: No significant disease LCx: Mid LCx focal 60% stenosis. dFR 0.97 (Non-significant) RCA: Prox 30%, mid 50% focal stenoses. LVEDP mildly elevated.   Impression: Nonobstructive CAD Nonischemic cardiomyopathy  EKG 07/01/2018: Afib with controlled ventricular rate  Recent labs: 02/02/2020: Glucose 173, BUN/Cr 31/1.9. EGFR 34. HbA1C 6.2% Chol 199, TG 383, HDL 38, LDL 97 TSH 3.4 normal   07/16/2018: Glucose 10. BUN/Cr 25/1.76, eGFR 40. Na/K 143/4.2  07/01/2018:  Glucose 87. BUN/Cr 24/1.9. eGFR 36. CMP otherwise normal.  H/H 14/44. Platelets 163. BNP 164. Chol 161, TG 230, HDL 47, non-HDL cholesterol 114.   Review of Systems  Cardiovascular:  Positive for chest pain, dyspnea on exertion (Mild, stable) and leg swelling.  Respiratory:  Positive for shortness of breath. Negative for cough.   All other systems reviewed and are negative.      Vitals:   08/24/20 1301  BP: (!) 157/82  Pulse: 79  Resp: 16  Temp: 98 F (36.7 C)  SpO2: 93%     Objective:   Physical Exam Vitals and nursing note reviewed.  Constitutional:      General: He is not in acute distress.    Appearance: He is obese.  Neck:     Vascular: No JVD.  Cardiovascular:     Rate and Rhythm: Normal rate and regular rhythm.     Heart sounds: Normal heart sounds. No murmur heard. Pulmonary:     Effort: Pulmonary effort is normal.     Breath sounds: Normal breath sounds. No wheezing or rales.  Musculoskeletal:     Right lower leg: Edema (2+) present.     Left lower leg: Edema (2+) present.  Skin:    Comments: Multiple small abrasions         Assessment & Recommendations:   65 y.o. Caucasian male with hypertension, hyperlipidemia, nonischemic cardiomyopathy, paroxysmal  atrial fibrillation, morbid obesity  Chest pain: Possible angina. Previously known to have nonobstructive CAD on cath in 2020. Stress test likely to be fraught with artifacts owing to body habitus.  He is reluctant to undergo coronary angiography at this time. I will start him on Imdur 30 mg daily. Will check echocardiogram  Chronic systolic heart failure [facility]): Nonischemic cardiomyopathy Stable NYHA class II symptoms Continue carvedilol 6.25 mg bid, losartan 25 mg daily Unable to use Entresto or spironolactone due to CKD.   Paroxysmal Afib: Now maintaining sinus rhythm s/p cardioversion 08/14/2018. CHA2DS2VASc score 3, annual stroke risk 3% Continue eliquis  5 mg bid.   Essential hypertension BP elevated, Hopefully Imdur will help  Morbid obesity (HCC) Diet and lifestyle modification, salt and calorie restriction diet.  OSA on CPAP: Tolerating well  F/u in 4 weeks  Selim Durden Esther Hardy, MD [facility] [place] Pager: [phone] Office: [phone] If no answer Cell [phone]

## 2020-08-25 ENCOUNTER — Encounter: Payer: Self-pay | Admitting: Cardiology

## 2020-08-31 ENCOUNTER — Ambulatory Visit: Payer: Medicare Other

## 2020-08-31 ENCOUNTER — Ambulatory Visit: Payer: Self-pay | Admitting: Cardiology

## 2020-08-31 ENCOUNTER — Other Ambulatory Visit: Payer: Self-pay

## 2020-08-31 VITALS — BP 131/61 | HR 82 | Ht 71.0 in | Wt 320.0 lb

## 2020-08-31 DIAGNOSIS — R072 Precordial pain: Secondary | ICD-10-CM

## 2020-08-31 DIAGNOSIS — I5022 Chronic systolic (congestive) heart failure: Secondary | ICD-10-CM

## 2020-08-31 NOTE — Progress Notes (Signed)
Today's Vitals   08/31/20 1422 08/31/20 1423 08/31/20 1424  BP: 131/61    Pulse: 82 86 82  SpO2: (!) 88% 91% 94%  Weight: (!) 320 lb (145.2 kg)    Height: '5\' 11"'$  (1.803 m)     Body mass index is 44.63 kg/m.  Hypoxic when he falls asleep. Uses CPAP at home.    Nigel Mormon, MD Pager: [phone] Office: [phone]

## 2020-09-06 ENCOUNTER — Encounter: Payer: Self-pay | Admitting: Cardiology

## 2020-09-06 ENCOUNTER — Other Ambulatory Visit: Payer: Self-pay

## 2020-09-06 ENCOUNTER — Ambulatory Visit: Payer: Medicare Other | Admitting: Cardiology

## 2020-09-06 VITALS — BP 162/89 | HR 84 | Temp 98.0°F | Resp 16 | Ht 71.0 in | Wt 326.0 lb

## 2020-09-06 DIAGNOSIS — I10 Essential (primary) hypertension: Secondary | ICD-10-CM

## 2020-09-06 DIAGNOSIS — I2089 Other forms of angina pectoris: Secondary | ICD-10-CM

## 2020-09-06 DIAGNOSIS — I480 Paroxysmal atrial fibrillation: Secondary | ICD-10-CM

## 2020-09-06 DIAGNOSIS — I208 Other forms of angina pectoris: Secondary | ICD-10-CM | POA: Insufficient documentation

## 2020-09-06 MED ORDER — ISOSORBIDE MONONITRATE ER 30 MG PO TB24: 30.0000 mg | ORAL_TABLET | Freq: Every day | ORAL | 3 refills | Status: DC

## 2020-09-06 MED ORDER — CARVEDILOL 12.5 MG PO TABS: 12.5000 mg | ORAL_TABLET | Freq: Two times a day (BID) | ORAL | 3 refills | Status: DC

## 2020-09-06 NOTE — Progress Notes (Signed)
Follow up visit  Subjective:   Stephen Maldonado, male    DOB: 1955-11-12, 65 y.o.   MRN: [id number]    Chief Complaint  Patient presents with   Chronic systolic heart failure    Follow-up    3-4 week   Atrial Fibrillation    65 y.o. Caucasian male with hypertension, hyperlipidemia, nonischemic cardiomyopathy, paroxysmal atrial fibrillation, morbid obesity, OSA on CPAP  Patient has not had any recurrent chest pain since starting Imdur.  However, he complains of headache.  Blood pressure remains uncontrolled.  Recent echocardiogram results reviewed with the patient.  Current Outpatient Medications on File Prior to Visit  Medication Sig Dispense Refill   apixaban (ELIQUIS) 5 MG TABS tablet Take 1 tablet by mouth 2 (two) times daily.     carvedilol (COREG) 6.25 MG tablet TAKE 1 TABLET (6.25 MG TOTAL) BY MOUTH 2 (TWO) TIMES DAILY WITH A MEAL. (Patient taking differently: Take 6.25 mg by mouth daily.) 180 tablet 1   Cholecalciferol (VITAMIN D) 50 MCG (2000 UT) tablet Take 2,000 Units by mouth daily.     Coenzyme Q10 (COQ-10) 200 MG CAPS Take 200 mg by mouth daily.     cyclobenzaprine (FLEXERIL) 10 MG tablet      ELIQUIS 5 MG TABS tablet TAKE 1 TABLET BY MOUTH TWICE A DAY 60 tablet 3   furosemide (LASIX) 80 MG tablet Take 1 tablet (80 mg total) by mouth 2 (two) times daily. 90 tablet 2   Garlic 8841 MG CAPS Take 1,000 mg by mouth daily.     hydrALAZINE (APRESOLINE) 25 MG tablet Take 25 mg by mouth 2 (two) times daily with a meal.  3   isosorbide mononitrate (IMDUR) 30 MG 24 hr tablet Take 1 tablet (30 mg total) by mouth daily. 30 tablet 3   Ketotifen Fumarate (ALLERGY EYE DROPS OP) Place 1 drop into both eyes daily as needed (allergies).     naphazoline-pheniramine (ALLERGY EYE) 0.025-0.3 % ophthalmic solution Place 1 drop into both eyes 4 (four) times daily as needed for eye irritation.     Omega-3 Fatty Acids (FISH OIL) 1000 MG CAPS Take 1,000 mg by mouth daily.      Potassium Chloride  ER 20 MEQ TBCR Take 20 mEq by mouth daily. with food     tamsulosin (FLOMAX) 0.4 MG CAPS capsule Take 0.4 mg by mouth every morning.     Turmeric 500 MG TABS Take 500 mg by mouth daily.     vitamin B-12 (CYANOCOBALAMIN) 1000 MCG tablet Take 1,000 mcg by mouth daily.     vitamin C (ASCORBIC ACID) 500 MG tablet Take 500 mg by mouth daily.      No current facility-administered medications on file prior to visit.    Cardiovascular studies:  Echocardiogram 08/31/2020:  Normal LV systolic function with EF 55%. Severe concentric hypertrophy of  the left ventricle. Normal global wall motion. Left ventricle cavity is  normal in size. Doppler evidence of grade I (impaired) diastolic  dysfunction, normal LAP. Calculated EF 55%.  Trileaflet aortic valve with moderate aortic valve leaflet calcification.  Mild aortic stenosis. Vmax 2.3 m/sec, mean PG 14 mmHg, AVA 1.4 cm2 by  continuity equation No regurgitation.  No other significant valvular abnormality.  IVC not seen.  Compared to previous study on 12/25/2018, LVEF improved from 35-40%.  EKG 08/24/2020: Sinus rhythm 77 bpm  Leftward axis Old anteroseptal infarct  Echocardiogram 12/25/2018: Left ventricle cavity is normal in size. Severe concentric hypertrophy of the left  ventricle. Moderately depressed LV systolic function with moderate global hypokinesis. LVEF 35-40%. Doppler evidence of grade I (impaired) diastolic dysfunction, normal LAP. Calculated EF 38%. Left atrial cavity is moderately dilated.. Probably trileaflet aortic valve with mild aortic valve leaflet calcification. Mild aortic stenosis. Aortic valve mean gradient of 10 mmHg, Vmax of 2.0  m/s. Calculated aortic valve area by continuity equation is 1.5 cm.  No regurgitation noted. IVC is dilated with a respiratory response of >50%. Estimated RA pressure 8 mmHg. Compared to previous study on 07/06/2018, LVEF improved from 20% to 35-40%.   EKG 08/24/2018: Sinus rhythm 86 bpm.  Left  atrial enlargement.  EKG 08/10/2018: Atrial fibrillation with RVR. No significant change compared to previous EKG.   Left/right heart cath 07/14/2018: LM: Normal LAD: No significant disease LCx: Mid LCx focal 60% stenosis. dFR 0.97 (Non-significant) RCA: Prox 30%, mid 50% focal stenoses. LVEDP mildly elevated.   Impression: Nonobstructive CAD Nonischemic cardiomyopathy  EKG 07/01/2018: Afib with controlled ventricular rate  Recent labs: 02/02/2020: Glucose 173, BUN/Cr 31/1.9. EGFR 34. HbA1C 6.2% Chol 199, TG 383, HDL 38, LDL 97 TSH 3.4 normal   07/16/2018: Glucose 10. BUN/Cr 25/1.76, eGFR 40. Na/K 143/4.2  07/01/2018:  Glucose 87. BUN/Cr 24/1.9. eGFR 36. CMP otherwise normal.  H/H 14/44. Platelets 163. BNP 164. Chol 161, TG 230, HDL 47, non-HDL cholesterol 114.   Review of Systems  Constitutional: Positive for malaise/fatigue.  Cardiovascular:  Positive for dyspnea on exertion (Mild, stable) and leg swelling (Mild, stable). Negative for chest pain.  Respiratory:  Positive for snoring. Negative for cough and shortness of breath.   Neurological:  Positive for headaches.  All other systems reviewed and are negative.      Vitals:   09/06/20 1247  BP: (!) 162/89  Pulse: 84  Resp: 16  Temp: 98 F (36.7 C)  SpO2: 95%     Objective:   Physical Exam Vitals and nursing note reviewed.  Constitutional:      General: He is not in acute distress.    Appearance: He is obese.  Neck:     Vascular: No JVD.  Cardiovascular:     Rate and Rhythm: Normal rate and regular rhythm.     Heart sounds: Normal heart sounds. No murmur heard. Pulmonary:     Effort: Pulmonary effort is normal.     Breath sounds: Normal breath sounds. No wheezing or rales.  Musculoskeletal:     Right lower leg: Edema (1+) present.     Left lower leg: Edema (1+) present.  Skin:    Comments: Multiple small abrasions         Assessment & Recommendations:   65 y.o. Caucasian male with  hypertension, hyperlipidemia, nonischemic cardiomyopathy, paroxysmal atrial fibrillation, morbid obesity  Chest pain: Improved since starting Imdur.  Encourage patient to take Tylenol to reduce side effect of headache. Given his CKD, will like to avoid invasive management as much as possible.  Chronic systolic heart failure ([place]): Nonischemic cardiomyopathy,.  EF now normalized (echocardiogram 08/2020) Continue baseline GDMT for heart failure. Unable to use Entresto or spironolactone due to CKD.   Paroxysmal Afib: Now maintaining sinus rhythm s/p cardioversion 08/14/2018. CHA2DS2VASc score 3, annual stroke risk 3% Continue eliquis 5 mg bid.   Essential hypertension: Uncontrolled.  Increase carvedilol to 12.5 mg twice daily..  OSA on CPAP: Tolerating well  F/u in 3 months   [place] Esther Hardy, MD [facility] [place] Pager: [phone] Office: [phone] If no answer Cell [phone]

## 2020-09-12 ENCOUNTER — Other Ambulatory Visit: Payer: Self-pay | Admitting: Otolaryngology

## 2020-09-12 DIAGNOSIS — H918X9 Other specified hearing loss, unspecified ear: Secondary | ICD-10-CM

## 2020-09-15 ENCOUNTER — Ambulatory Visit: Payer: PRIVATE HEALTH INSURANCE | Admitting: Cardiology

## 2020-09-16 IMAGING — DX CHEST - 2 VIEW
2 series · 2 of 2 positions shown · non-contrast
Comparison: Chest x-ray dated 07/10/2016.

CLINICAL DATA: AFib

EXAM:
CHEST - 2 VIEW

[dg chest 2 view (1 of 2)]
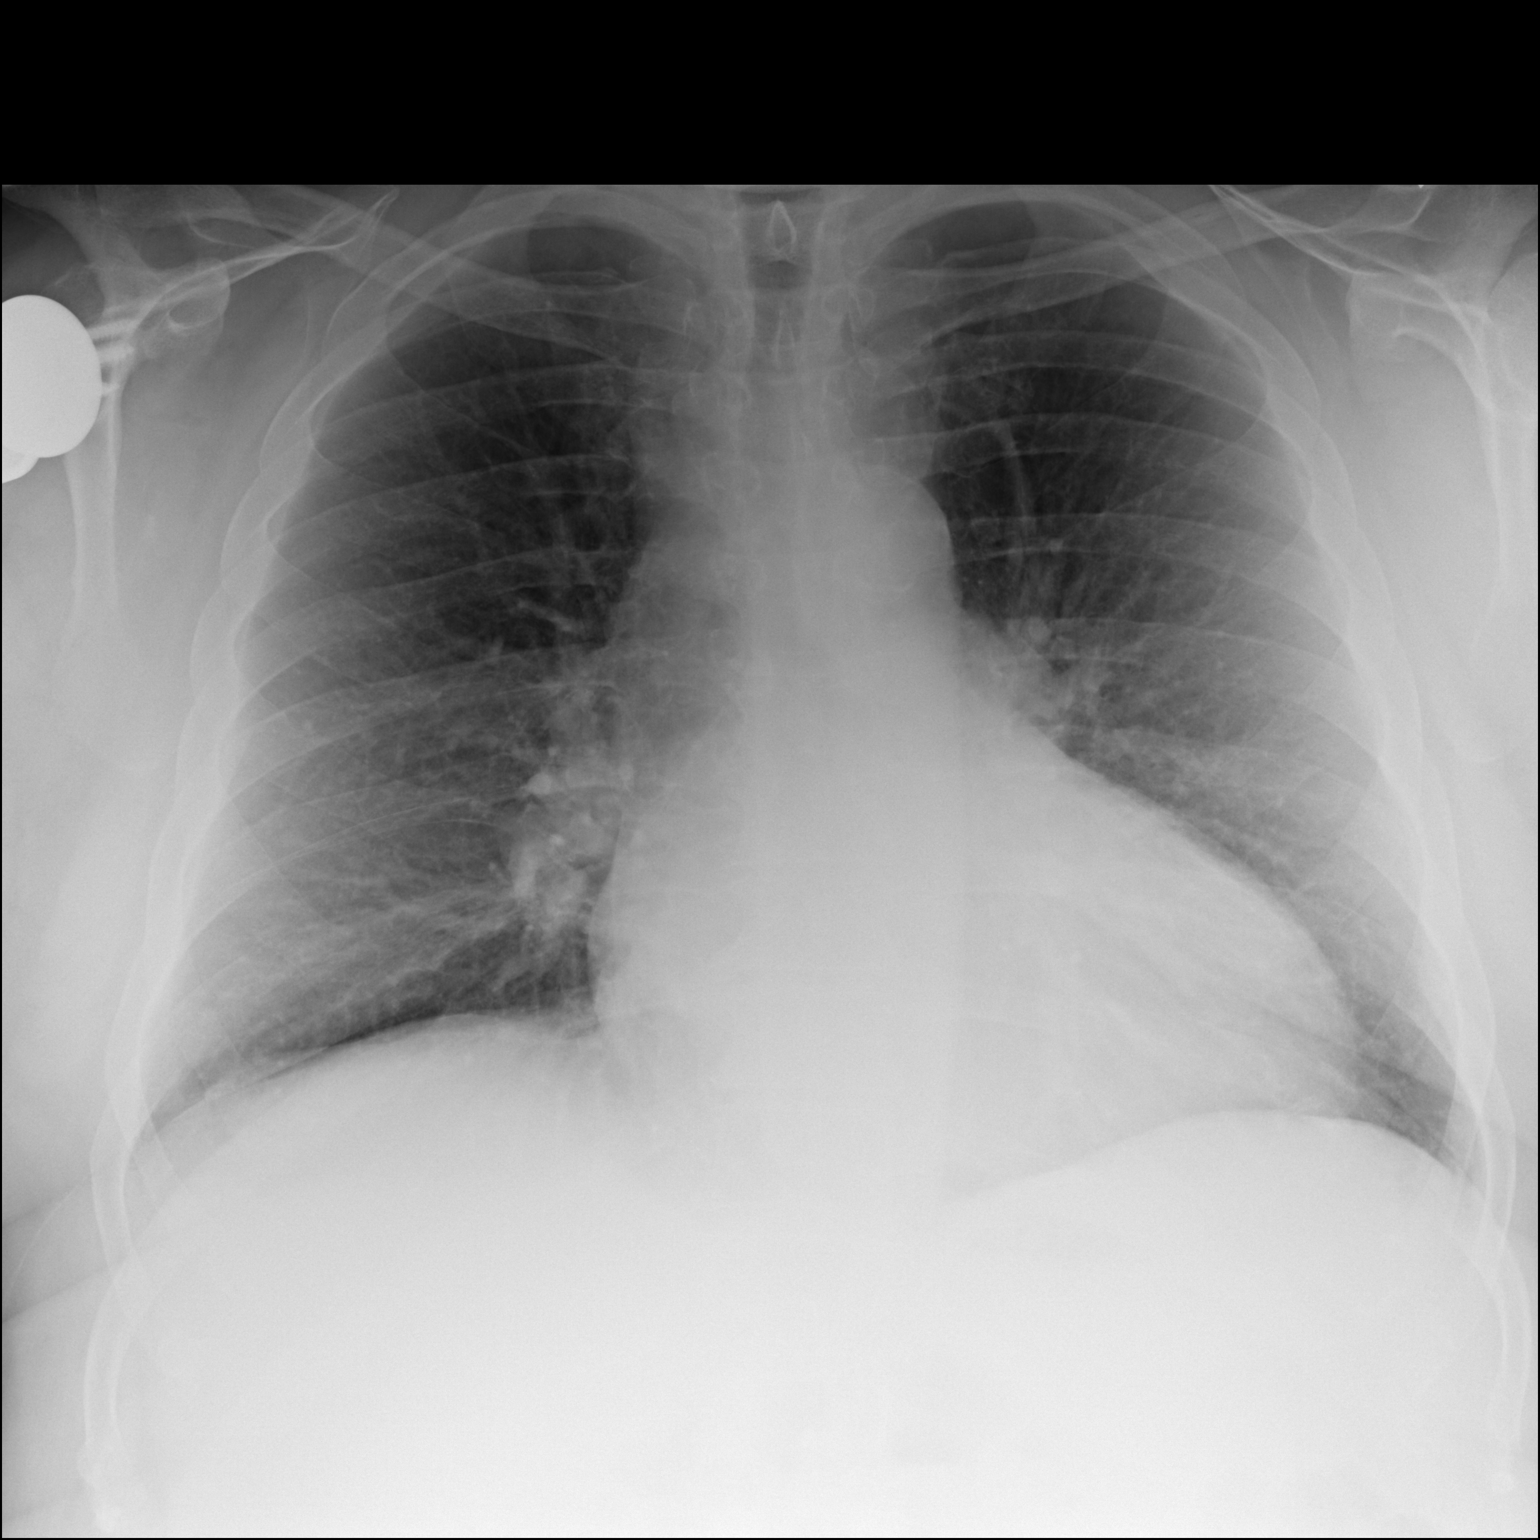

[dg chest 2 view (2 of 2)]
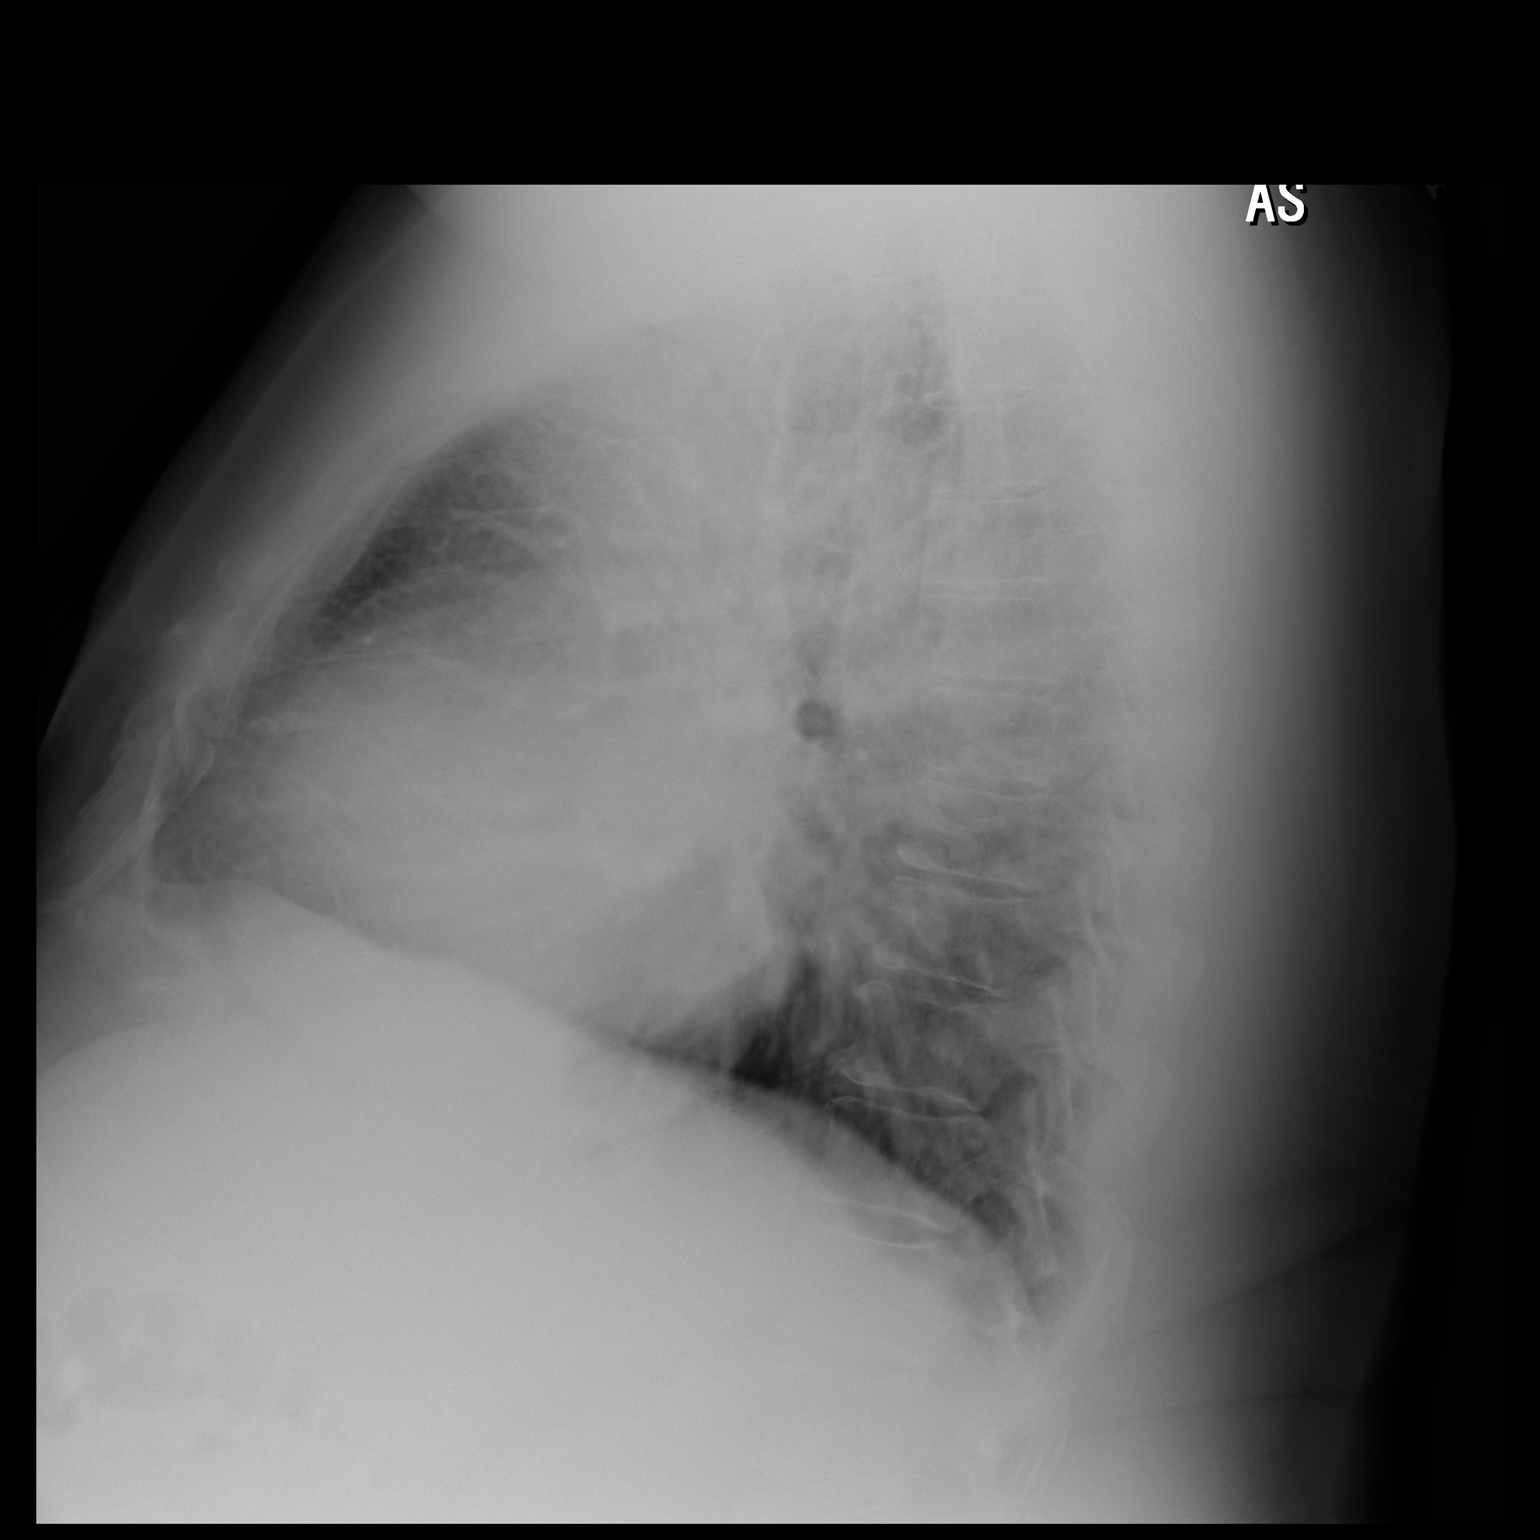

[2 of 2 positions shown; findings below may reference images not displayed]

FINDINGS: The heart size is enlarged and increased from prior x-ray in 5049.
The patient is status post remote total shoulder arthroplasty on the
right. There is no pneumothorax. No significant area of
consolidation or pleural effusion.
IMPRESSION: No active cardiopulmonary disease.

Cardiomegaly.

## 2020-10-01 ENCOUNTER — Ambulatory Visit
Admission: RE | Admit: 2020-10-01 | Discharge: 2020-10-01 | Disposition: A | Discharge status: Home / Self Care | Payer: Medicare Other | Source: Ambulatory Visit | Attending: Otolaryngology | Admitting: Otolaryngology

## 2020-10-01 DIAGNOSIS — H918X9 Other specified hearing loss, unspecified ear: Secondary | ICD-10-CM

## 2020-10-01 MED ORDER — GADOBENATE DIMEGLUMINE 529 MG/ML IV SOLN
20.0000 mL | Freq: Once | INTRAVENOUS | Status: AC | PRN
  Administered 2020-10-01: 20 mL via INTRAVENOUS

## 2020-12-07 ENCOUNTER — Ambulatory Visit: Payer: PRIVATE HEALTH INSURANCE | Admitting: Cardiology

## 2021-02-06 NOTE — Progress Notes (Signed)
Follow up visit  Subjective:   Stephen Maldonado, male    DOB: 09/14/1955, 66 y.o.   MRN: [id number]    Chief Complaint  Patient presents with   Atrial Fibrillation   Congestive Heart Failure   Chest Pain   Follow-up    3 mont    66 y.o. Caucasian male with hypertension, hyperlipidemia, nonischemic cardiomyopathy, paroxysmal atrial fibrillation, morbid obesity, OSA on CPAP  Patient has not had any recurrent chest pain since starting Imdur. Blood pressure is well controlled. Mild leg edema persists. He often has easy bruising on his legs and shins.   Current Outpatient Medications on File Prior to Visit  Medication Sig Dispense Refill   apixaban (ELIQUIS) 5 MG TABS tablet Take 1 tablet by mouth 2 (two) times daily.     carvedilol (COREG) 12.5 MG tablet Take 1 tablet (12.5 mg total) by mouth 2 (two) times daily with a meal. 90 tablet 3   Cholecalciferol (VITAMIN D) 50 MCG (2000 UT) tablet Take 2,000 Units by mouth daily.     Coenzyme Q10 (COQ-10) 200 MG CAPS Take 200 mg by mouth daily.     cyclobenzaprine (FLEXERIL) 10 MG tablet      ELIQUIS 5 MG TABS tablet TAKE 1 TABLET BY MOUTH TWICE A DAY 60 tablet 3   furosemide (LASIX) 80 MG tablet Take 1 tablet (80 mg total) by mouth 2 (two) times daily. 90 tablet 2   Garlic 7829 MG CAPS Take 1,000 mg by mouth daily.     hydrALAZINE (APRESOLINE) 25 MG tablet Take 25 mg by mouth 2 (two) times daily with a meal.  3   isosorbide mononitrate (IMDUR) 30 MG 24 hr tablet Take 1 tablet (30 mg total) by mouth daily. 90 tablet 3   Ketotifen Fumarate (ALLERGY EYE DROPS OP) Place 1 drop into both eyes daily as needed (allergies).     naphazoline-pheniramine (ALLERGY EYE) 0.025-0.3 % ophthalmic solution Place 1 drop into both eyes 4 (four) times daily as needed for eye irritation.     Omega-3 Fatty Acids (FISH OIL) 1000 MG CAPS Take 1,000 mg by mouth daily.      Potassium Chloride ER 20 MEQ TBCR Take 20 mEq by mouth daily. with food     tamsulosin  (FLOMAX) 0.4 MG CAPS capsule Take 0.4 mg by mouth every morning.     Turmeric 500 MG TABS Take 500 mg by mouth daily.     vitamin B-12 (CYANOCOBALAMIN) 1000 MCG tablet Take 1,000 mcg by mouth daily.     vitamin C (ASCORBIC ACID) 500 MG tablet Take 500 mg by mouth daily.      No current facility-administered medications on file prior to visit.    Cardiovascular studies:  EKG 02/07/2021: Sinus rhythm 87 bpm Possible old inferior infarct   Echocardiogram 08/31/2020:  Normal LV systolic function with EF 55%. Severe concentric hypertrophy of  the left ventricle. Normal global wall motion. Left ventricle cavity is  normal in size. Doppler evidence of grade I (impaired) diastolic  dysfunction, normal LAP. Calculated EF 55%.  Trileaflet aortic valve with moderate aortic valve leaflet calcification.  Mild aortic stenosis. Vmax 2.3 m/sec, mean PG 14 mmHg, AVA 1.4 cm2 by  continuity equation No regurgitation.  No other significant valvular abnormality.  IVC not seen.  Compared to previous study on 12/25/2018, LVEF improved from 35-40%.  EKG 08/24/2020: Sinus rhythm 77 bpm  Leftward axis Old anteroseptal infarct  Echocardiogram 12/25/2018: Left ventricle cavity is normal in size.  Severe concentric hypertrophy of the left ventricle. Moderately depressed LV systolic function with moderate global hypokinesis. LVEF 35-40%. Doppler evidence of grade I (impaired) diastolic dysfunction, normal LAP. Calculated EF 38%. Left atrial cavity is moderately dilated.. Probably trileaflet aortic valve with mild aortic valve leaflet calcification. Mild aortic stenosis. Aortic valve mean gradient of 10 mmHg, Vmax of 2.0  m/s. Calculated aortic valve area by continuity equation is 1.5 cm.  No regurgitation noted. IVC is dilated with a respiratory response of >50%. Estimated RA pressure 8 mmHg. Compared to previous study on 07/06/2018, LVEF improved from 20% to 35-40%.   EKG 08/24/2018: Sinus rhythm 86 bpm.   Left atrial enlargement.  EKG 08/10/2018: Atrial fibrillation with RVR. No significant change compared to previous EKG.   Left/right heart cath 07/14/2018: LM: Normal LAD: No significant disease LCx: Mid LCx focal 60% stenosis. dFR 0.97 (Non-significant) RCA: Prox 30%, mid 50% focal stenoses. LVEDP mildly elevated.   Impression: Nonobstructive CAD Nonischemic cardiomyopathy  EKG 07/01/2018: Afib with controlled ventricular rate  Recent labs: 02/02/2020: Glucose 173, BUN/Cr 31/1.9. EGFR 34. HbA1C 6.2% Chol 199, TG 383, HDL 38, LDL 97 TSH 3.4 normal   07/16/2018: Glucose 10. BUN/Cr 25/1.76, eGFR 40. Na/K 143/4.2  07/01/2018:  Glucose 87. BUN/Cr 24/1.9. eGFR 36. CMP otherwise normal.  H/H 14/44. Platelets 163. BNP 164. Chol 161, TG 230, HDL 47, non-HDL cholesterol 114.   Review of Systems  Constitutional: Negative for malaise/fatigue.  Cardiovascular:  Positive for leg swelling (Mild, stable). Negative for chest pain and dyspnea on exertion.  Respiratory:  Positive for snoring. Negative for cough and shortness of breath.   Neurological:  Positive for headaches.  All other systems reviewed and are negative.      Vitals:   02/07/21 1309  BP: 121/74  Pulse: 84  Resp: 16  Temp: 98 F (36.7 C)  SpO2: 95%     Objective:   Physical Exam Vitals and nursing note reviewed.  Constitutional:      General: He is not in acute distress.    Appearance: He is obese.  Neck:     Vascular: No JVD.  Cardiovascular:     Rate and Rhythm: Normal rate and regular rhythm.     Heart sounds: Normal heart sounds. No murmur heard. Pulmonary:     Effort: Pulmonary effort is normal.     Breath sounds: Normal breath sounds. No wheezing or rales.  Musculoskeletal:     Right lower leg: Edema (1+) present.     Left lower leg: Edema (1+) present.  Skin:    Comments: Sin abrasions on shin         Assessment & Recommendations:   66 y.o. Caucasian male with hypertension,  hyperlipidemia, nonischemic cardiomyopathy, paroxysmal atrial fibrillation, morbid obesity  Chest pain: Improved since starting Imdur.  Encourage patient to take Tylenol to reduce side effect of headache. Given his CKD, will like to avoid invasive management as much as possible.  Chronic systolic heart failure ([place]): Nonischemic cardiomyopathy,.  EF now normalized (echocardiogram 08/2020) Continue baseline GDMT for heart failure. Unable to use Entresto or spironolactone due to CKD.   Leg edema: Along with minor skin abrasions may be secondary to venous insufficiency. Recommend leg elevation and 15-20 mmHg compression stockings He is not interested in referral to vein specialist at this time.  Paroxysmal Afib: Now maintaining sinus rhythm s/p cardioversion 08/14/2018. CHA2DS2VASc score 3, annual stroke risk 3% Continue eliquis 5 mg bid.   Essential hypertension: Controlled.  OSA on CPAP:  Tolerating well  F/u in 3 months   [place], MD [facility] Pager: [phone] Office: [phone] If no answer Cell [phone]

## 2021-02-07 ENCOUNTER — Ambulatory Visit: Payer: Medicare HMO | Admitting: Cardiology

## 2021-02-07 ENCOUNTER — Encounter: Payer: Self-pay | Admitting: Cardiology

## 2021-02-07 ENCOUNTER — Other Ambulatory Visit: Payer: Self-pay

## 2021-02-07 VITALS — BP 121/74 | HR 84 | Temp 98.0°F | Resp 16 | Ht 71.0 in | Wt 327.0 lb

## 2021-02-07 DIAGNOSIS — I480 Paroxysmal atrial fibrillation: Secondary | ICD-10-CM

## 2021-02-07 DIAGNOSIS — I5022 Chronic systolic (congestive) heart failure: Secondary | ICD-10-CM | POA: Diagnosis not present

## 2021-02-07 DIAGNOSIS — R600 Localized edema: Secondary | ICD-10-CM | POA: Insufficient documentation

## 2021-02-07 DIAGNOSIS — I10 Essential (primary) hypertension: Secondary | ICD-10-CM

## 2021-02-07 DIAGNOSIS — I208 Other forms of angina pectoris: Secondary | ICD-10-CM

## 2021-02-07 MED ORDER — APIXABAN 5 MG PO TABS: 5.0000 mg | ORAL_TABLET | Freq: Two times a day (BID) | ORAL | 3 refills | Status: DC

## 2021-02-27 DIAGNOSIS — H9042 Sensorineural hearing loss, unilateral, left ear, with unrestricted hearing on the contralateral side: Secondary | ICD-10-CM | POA: Diagnosis not present

## 2021-02-27 DIAGNOSIS — M109 Gout, unspecified: Secondary | ICD-10-CM | POA: Diagnosis not present

## 2021-03-02 DIAGNOSIS — E1165 Type 2 diabetes mellitus with hyperglycemia: Secondary | ICD-10-CM | POA: Diagnosis not present

## 2021-03-02 DIAGNOSIS — M109 Gout, unspecified: Secondary | ICD-10-CM | POA: Diagnosis not present

## 2021-03-02 DIAGNOSIS — I509 Heart failure, unspecified: Secondary | ICD-10-CM | POA: Diagnosis not present

## 2021-04-25 DIAGNOSIS — I4891 Unspecified atrial fibrillation: Secondary | ICD-10-CM | POA: Diagnosis not present

## 2021-04-25 DIAGNOSIS — M109 Gout, unspecified: Secondary | ICD-10-CM | POA: Diagnosis not present

## 2021-04-25 DIAGNOSIS — R609 Edema, unspecified: Secondary | ICD-10-CM | POA: Diagnosis not present

## 2021-04-25 DIAGNOSIS — I10 Essential (primary) hypertension: Secondary | ICD-10-CM | POA: Diagnosis not present

## 2021-04-25 DIAGNOSIS — G4734 Idiopathic sleep related nonobstructive alveolar hypoventilation: Secondary | ICD-10-CM | POA: Diagnosis not present

## 2021-04-25 DIAGNOSIS — G4733 Obstructive sleep apnea (adult) (pediatric): Secondary | ICD-10-CM | POA: Diagnosis not present

## 2021-04-25 DIAGNOSIS — I509 Heart failure, unspecified: Secondary | ICD-10-CM | POA: Diagnosis not present

## 2021-04-25 DIAGNOSIS — E1165 Type 2 diabetes mellitus with hyperglycemia: Secondary | ICD-10-CM | POA: Diagnosis not present

## 2021-07-11 DIAGNOSIS — E1165 Type 2 diabetes mellitus with hyperglycemia: Secondary | ICD-10-CM | POA: Diagnosis not present

## 2021-07-11 DIAGNOSIS — I129 Hypertensive chronic kidney disease with stage 1 through stage 4 chronic kidney disease, or unspecified chronic kidney disease: Secondary | ICD-10-CM | POA: Diagnosis not present

## 2021-07-11 DIAGNOSIS — I10 Essential (primary) hypertension: Secondary | ICD-10-CM | POA: Diagnosis not present

## 2021-07-11 DIAGNOSIS — Z23 Encounter for immunization: Secondary | ICD-10-CM | POA: Diagnosis not present

## 2021-07-11 DIAGNOSIS — I4891 Unspecified atrial fibrillation: Secondary | ICD-10-CM | POA: Diagnosis not present

## 2021-07-11 DIAGNOSIS — Z0001 Encounter for general adult medical examination with abnormal findings: Secondary | ICD-10-CM | POA: Diagnosis not present

## 2021-07-11 DIAGNOSIS — D6869 Other thrombophilia: Secondary | ICD-10-CM | POA: Diagnosis not present

## 2021-07-11 DIAGNOSIS — Z125 Encounter for screening for malignant neoplasm of prostate: Secondary | ICD-10-CM | POA: Diagnosis not present

## 2021-07-11 DIAGNOSIS — N184 Chronic kidney disease, stage 4 (severe): Secondary | ICD-10-CM | POA: Diagnosis not present

## 2021-07-11 DIAGNOSIS — E1159 Type 2 diabetes mellitus with other circulatory complications: Secondary | ICD-10-CM | POA: Diagnosis not present

## 2021-07-11 DIAGNOSIS — I890 Lymphedema, not elsewhere classified: Secondary | ICD-10-CM | POA: Diagnosis not present

## 2021-07-11 DIAGNOSIS — I509 Heart failure, unspecified: Secondary | ICD-10-CM | POA: Diagnosis not present

## 2021-07-11 DIAGNOSIS — G4733 Obstructive sleep apnea (adult) (pediatric): Secondary | ICD-10-CM | POA: Diagnosis not present

## 2021-07-16 ENCOUNTER — Other Ambulatory Visit: Payer: Self-pay

## 2021-07-16 DIAGNOSIS — I480 Paroxysmal atrial fibrillation: Secondary | ICD-10-CM

## 2021-07-16 MED ORDER — APIXABAN 5 MG PO TABS: 5.0000 mg | ORAL_TABLET | Freq: Two times a day (BID) | ORAL | 3 refills | Status: DC

## 2021-07-23 DIAGNOSIS — R972 Elevated prostate specific antigen [PSA]: Secondary | ICD-10-CM | POA: Diagnosis not present

## 2021-08-09 ENCOUNTER — Other Ambulatory Visit: Payer: Medicare HMO

## 2021-08-15 ENCOUNTER — Ambulatory Visit: Payer: Medicare HMO | Admitting: Cardiology

## 2021-08-20 DIAGNOSIS — R972 Elevated prostate specific antigen [PSA]: Secondary | ICD-10-CM | POA: Diagnosis not present

## 2021-09-18 ENCOUNTER — Ambulatory Visit: Payer: Medicare HMO

## 2021-09-18 ENCOUNTER — Encounter: Payer: Self-pay | Admitting: Cardiology

## 2021-09-18 DIAGNOSIS — I5022 Chronic systolic (congestive) heart failure: Secondary | ICD-10-CM | POA: Diagnosis not present

## 2021-09-24 ENCOUNTER — Ambulatory Visit: Payer: Medicare HMO | Admitting: Cardiology

## 2021-10-10 ENCOUNTER — Encounter: Payer: Self-pay | Admitting: Cardiology

## 2021-10-10 ENCOUNTER — Ambulatory Visit: Payer: Medicare HMO | Admitting: Cardiology

## 2021-10-10 VITALS — BP 115/77 | HR 79 | Resp 16 | Ht 71.0 in | Wt 279.0 lb

## 2021-10-10 DIAGNOSIS — I480 Paroxysmal atrial fibrillation: Secondary | ICD-10-CM

## 2021-10-10 DIAGNOSIS — I5022 Chronic systolic (congestive) heart failure: Secondary | ICD-10-CM

## 2021-10-10 DIAGNOSIS — I350 Nonrheumatic aortic (valve) stenosis: Secondary | ICD-10-CM | POA: Insufficient documentation

## 2021-10-10 DIAGNOSIS — I10 Essential (primary) hypertension: Secondary | ICD-10-CM | POA: Diagnosis not present

## 2021-10-10 NOTE — Progress Notes (Signed)
Follow up visit  Subjective:   Stephen Maldonado, male    DOB: September 13, 1955, 66 y.o.   MRN: [id number]   Chief Complaint  Patient presents with   Chronic systolic heart failure    Follow-up    66 y.o. Caucasian male with hypertension, hyperlipidemia, nonischemic cardiomyopathy, paroxysmal atrial fibrillation, morbid obesity, OSA on CPAP  Patient has intentionally lost a lot of weight and feels well, denies chest pain, shortness of breath, palpitations, leg edema, orthopnea, PND, TIA/syncope. However, he has had episodes of lightheadedness and near syncope, but no syncope on exertion. Symptoms never occur at rest. Reviewed recent test results with the patient, details below.      Current Outpatient Medications:    apixaban (ELIQUIS) 5 MG TABS tablet, Take 1 tablet (5 mg total) by mouth 2 (two) times daily., Disp: 180 tablet, Rfl: 3   carvedilol (COREG) 12.5 MG tablet, Take 1 tablet (12.5 mg total) by mouth 2 (two) times daily with a meal., Disp: 90 tablet, Rfl: 3   Cholecalciferol (VITAMIN D) 50 MCG (2000 UT) tablet, Take 2,000 Units by mouth daily., Disp: , Rfl:    Coenzyme Q10 (COQ-10) 200 MG CAPS, Take 200 mg by mouth daily., Disp: , Rfl:    Colchicine 0.6 MG CAPS, 1 capsule, Disp: , Rfl:    cyclobenzaprine (FLEXERIL) 10 MG tablet, , Disp: , Rfl:    furosemide (LASIX) 80 MG tablet, Take 1 tablet (80 mg total) by mouth 2 (two) times daily., Disp: 90 tablet, Rfl: 2   Garlic 6834 MG CAPS, Take 1,000 mg by mouth daily., Disp: , Rfl:    hydrALAZINE (APRESOLINE) 25 MG tablet, Take 25 mg by mouth 2 (two) times daily with a meal., Disp: , Rfl: 3   isosorbide mononitrate (IMDUR) 30 MG 24 hr tablet, Take 1 tablet (30 mg total) by mouth daily., Disp: 90 tablet, Rfl: 3   Ketotifen Fumarate (ALLERGY EYE DROPS OP), Place 1 drop into both eyes daily as needed (allergies)., Disp: , Rfl:    naphazoline-pheniramine (ALLERGY EYE) 0.025-0.3 % ophthalmic solution, Place 1 drop into both eyes 4 (four)  times daily as needed for eye irritation., Disp: , Rfl:    Omega-3 Fatty Acids (FISH OIL) 1000 MG CAPS, Take 1,000 mg by mouth daily. , Disp: , Rfl:    Potassium Chloride ER 20 MEQ TBCR, Take 20 mEq by mouth daily. with food, Disp: , Rfl:    tamsulosin (FLOMAX) 0.4 MG CAPS capsule, Take 0.4 mg by mouth every morning., Disp: , Rfl:    Turmeric 500 MG TABS, Take 500 mg by mouth daily., Disp: , Rfl:    vitamin B-12 (CYANOCOBALAMIN) 1000 MCG tablet, Take 1,000 mcg by mouth daily., Disp: , Rfl:    vitamin C (ASCORBIC ACID) 500 MG tablet, Take 500 mg by mouth daily. , Disp: , Rfl:     Cardiovascular studies:  EKG 10/10/2021: Sinus rhythm 74 bpm First degree A-V block   Echocardiogram 09/18/2021:  Normal LV systolic function with visual EF 60-65%. Left ventricle cavity  is normal in size. Moderate concentric hypertrophy of the left ventricle.  Normal global wall motion. Doppler evidence of grade I (impaired)  diastolic dysfunction, normal LAP. Calculated EF 64%.  Trileaflet aortic valve with no regurgitation. Severe aortic valve leaflet  thickening with moderate calcification. Moderate to severe aortic valve  stenosis. AVA (VTI) measures 1.6 cm^2. AV Mean Grad measures 34.8 mmHg. AV  Pk Vel measures 3.91 m/s.  Structurally normal tricuspid valve.  Mild  tricuspid regurgitation. No  evidence of pulmonary hypertension.   Left/right heart cath 2020: LM: Normal LAD: No significant disease LCx: Mid LCx focal 60% stenosis. dFR 0.97 (Non-significant) RCA: Prox 30%, mid 50% focal stenoses. LVEDP mildly elevated.   Impression: Nonobstructive CAD Nonischemic cardiomyopathy  Recent labs: 08/20/2021: Glucose 105, BUN/Cr 36/1.98. EGFR 36. Na/K 141/3.9. Rest of the CMP normal H/H 14/44. MCV 85. Platelets 211 HbA1C 6.6% Chol 165, TG 166, HDL 37, LDL 99 TSH 3.1 normal   Review of Systems  Constitutional: Negative for malaise/fatigue.  Cardiovascular:  Positive for leg swelling (Mild, stable).  Negative for chest pain and dyspnea on exertion.  Respiratory:  Positive for snoring. Negative for cough and shortness of breath.   Neurological:  Positive for headaches.  All other systems reviewed and are negative.       Vitals:   10/10/21 1341  BP: 115/77  Pulse: 79  Resp: 16  SpO2: 94%     Objective:   Physical Exam Vitals and nursing note reviewed.  Constitutional:      General: He is not in acute distress.    Appearance: He is obese.  Neck:     Vascular: No JVD.  Cardiovascular:     Rate and Rhythm: Normal rate and regular rhythm.     Heart sounds: Normal heart sounds. No murmur heard. Pulmonary:     Effort: Pulmonary effort is normal.     Breath sounds: Normal breath sounds. No wheezing or rales.  Musculoskeletal:     Right lower leg: Edema (1+) present.     Left lower leg: Edema (1+) present.  Skin:    Comments: Sin abrasions on shin          Assessment & Recommendations:   66 y.o. Caucasian male with hypertension, hyperlipidemia, nonischemic cardiomyopathy, paroxysmal atrial fibrillation, morbid obesity, aortic stenosis  Aortic stenosis: Vmax 3.9 m/sec, mean PG 35 mmHg, AVA 1.6 cm2 with AVAi 0.6 cm2, dimensionless index 0.44. Moderate to severe AS. I am concerned given his exertional lightheadedness symptoms. Given somewhat conflicting data re: severity of aortic stenosis and his symptoms, I recommend invasive hemodynamic assessment with RHC/LHC and coronary angiography. Discussed benefits and risks-specifically CIN, risk of stroke, in addition to bleeding, infection, MI, arrhythmia, death, with about 1-2% risk.  Patient wants to have another appt with his wife in presence to further discuss this.   Chronic systolic heart failure ([place]): Nonischemic cardiomyopathy,.  EF now normalized (echocardiogram 08/2020) Appears euvolemic. Continue baseline GDMT for heart failure. Unable to use Entresto or spironolactone due to CKD.   Paroxysmal Afib: Now  maintaining sinus rhythm s/p cardioversion 08/14/2018. CHA2DS2VASc score 3, annual stroke risk 3% Continue eliquis 5 mg bid.   Essential hypertension: Controlled.   OSA on CPAP: Tolerating well  F/u in 3-4 weeks    Nigel Mormon, MD Pager: [phone] Office: [phone]

## 2021-11-07 ENCOUNTER — Encounter: Payer: Self-pay | Admitting: Cardiology

## 2021-11-07 ENCOUNTER — Ambulatory Visit: Payer: Medicare HMO | Admitting: Cardiology

## 2021-11-07 VITALS — BP 101/67 | HR 87 | Temp 98.4°F | Resp 16 | Ht 71.0 in | Wt 284.0 lb

## 2021-11-07 DIAGNOSIS — I5022 Chronic systolic (congestive) heart failure: Secondary | ICD-10-CM | POA: Diagnosis not present

## 2021-11-07 DIAGNOSIS — I350 Nonrheumatic aortic (valve) stenosis: Secondary | ICD-10-CM

## 2021-11-07 NOTE — H&P (View-Only) (Signed)
 Follow up visit  Subjective:   Stephen Maldonado, male    DOB: 03/17/1955, 65 y.o.   MRN: [id number]   Chief Complaint  Patient presents with   Aortic Stenosis   Follow-up    65 y.o. Caucasian male with hypertension, hyperlipidemia, h/o NICM with recovered LVEF, PAF, moderate obesity, OSA, now with moderate to severe aortic stenosis.  Patient is here today with his wife. He continues to have exertional lightheadedness symptoms. He also feels very "lousy and fatigues" as ell as short of breath with minimal amount of walking. He denies chest pain.     Current Outpatient Medications:    apixaban (ELIQUIS) 5 MG TABS tablet, Take 1 tablet (5 mg total) by mouth 2 (two) times daily., Disp: 180 tablet, Rfl: 3   carvedilol (COREG) 12.5 MG tablet, Take 1 tablet (12.5 mg total) by mouth 2 (two) times daily with a meal., Disp: 90 tablet, Rfl: 3   Cholecalciferol (VITAMIN D) 50 MCG (2000 UT) tablet, Take 2,000 Units by mouth daily., Disp: , Rfl:    Coenzyme Q10 (COQ-10) 200 MG CAPS, Take 200 mg by mouth daily., Disp: , Rfl:    Colchicine 0.6 MG CAPS, 1 capsule, Disp: , Rfl:    cyclobenzaprine (FLEXERIL) 10 MG tablet, , Disp: , Rfl:    furosemide (LASIX) 80 MG tablet, Take 1 tablet (80 mg total) by mouth 2 (two) times daily., Disp: 90 tablet, Rfl: 2   Garlic 1000 MG CAPS, Take 1,000 mg by mouth daily., Disp: , Rfl:    hydrALAZINE (APRESOLINE) 25 MG tablet, Take 25 mg by mouth 2 (two) times daily with a meal., Disp: , Rfl: 3   isosorbide mononitrate (IMDUR) 30 MG 24 hr tablet, Take 1 tablet (30 mg total) by mouth daily., Disp: 90 tablet, Rfl: 3   Ketotifen Fumarate (ALLERGY EYE DROPS OP), Place 1 drop into both eyes daily as needed (allergies)., Disp: , Rfl:    naphazoline-pheniramine (ALLERGY EYE) 0.025-0.3 % ophthalmic solution, Place 1 drop into both eyes 4 (four) times daily as needed for eye irritation., Disp: , Rfl:    Omega-3 Fatty Acids (FISH OIL) 1000 MG CAPS, Take 1,000 mg by mouth daily.  , Disp: , Rfl:    Potassium Chloride ER 20 MEQ TBCR, Take 20 mEq by mouth daily. with food, Disp: , Rfl:    tamsulosin (FLOMAX) 0.4 MG CAPS capsule, Take 0.4 mg by mouth every morning., Disp: , Rfl:    Turmeric 500 MG TABS, Take 500 mg by mouth daily., Disp: , Rfl:    vitamin B-12 (CYANOCOBALAMIN) 1000 MCG tablet, Take 1,000 mcg by mouth daily., Disp: , Rfl:    vitamin C (ASCORBIC ACID) 500 MG tablet, Take 500 mg by mouth daily. , Disp: , Rfl:     Cardiovascular studies:  EKG 10/10/2021: Sinus rhythm 74 bpm First degree A-V block   Echocardiogram 09/18/2021:  Normal LV systolic function with visual EF 60-65%. Left ventricle cavity  is normal in size. Moderate concentric hypertrophy of the left ventricle.  Normal global wall motion. Doppler evidence of grade I (impaired)  diastolic dysfunction, normal LAP. Calculated EF 64%.  Trileaflet aortic valve with no regurgitation. Severe aortic valve leaflet  thickening with moderate calcification. Moderate to severe aortic valve  stenosis. AVA (VTI) measures 1.6 cm^2. AV Mean Grad measures 34.8 mmHg. AV  Pk Vel measures 3.91 m/s.  Structurally normal tricuspid valve.  Mild tricuspid regurgitation. No  evidence of pulmonary hypertension.   Left/right heart cath 2020:   LM: Normal LAD: No significant disease LCx: Mid LCx focal 60% stenosis. dFR 0.97 (Non-significant) RCA: Prox 30%, mid 50% focal stenoses. LVEDP mildly elevated.   Impression: Nonobstructive CAD Nonischemic cardiomyopathy  Recent labs: 08/20/2021: Glucose 105, BUN/Cr 36/1.98. EGFR 36. Na/K 141/3.9. Rest of the CMP normal H/H 14/44. MCV 85. Platelets 211 HbA1C 6.6% Chol 165, TG 166, HDL 37, LDL 99 TSH 3.1 normal   Review of Systems  Constitutional: Negative for malaise/fatigue.  Cardiovascular:  Positive for leg swelling (Mild, stable). Negative for chest pain and dyspnea on exertion.  Respiratory:  Positive for snoring. Negative for cough and shortness of breath.    Neurological:  Positive for headaches.  All other systems reviewed and are negative.       Vitals:   11/07/21 1449  BP: 101/67  Pulse: 87  Resp: 16  Temp: 98.4 F (36.9 C)  SpO2: 94%     Objective:   Physical Exam Vitals and nursing note reviewed.  Constitutional:      General: He is not in acute distress.    Appearance: He is obese.  Neck:     Vascular: No JVD.  Cardiovascular:     Rate and Rhythm: Normal rate and regular rhythm.     Heart sounds: Normal heart sounds. No murmur heard. Pulmonary:     Effort: Pulmonary effort is normal.     Breath sounds: Normal breath sounds. No wheezing or rales.  Musculoskeletal:     Right lower leg: Edema (1+) present.     Left lower leg: Edema (1+) present.  Skin:    Comments: Sin abrasions on shin          Assessment & Recommendations:    65 y.o. Caucasian male with hypertension, hyperlipidemia, h/o NICM with recovered LVEF, PAF, moderate obesity, OSA, now with moderate to severe aortic stenosis.  Aortic stenosis: Vmax 3.9 m/sec, mean PG 35 mmHg, AVA 1.6 cm2 with AVAi 0.6 cm2, dimensionless index 0.44. Moderate to severe AS. I am concerned given his exertional lightheadedness and dyspnea symptoms. Given somewhat conflicting data re: severity of aortic stenosis and his symptoms, I recommend invasive hemodynamic assessment with RHC/LHC and coronary angiography. Discussed benefits and risks-specifically CIN, risk of stroke, in addition to bleeding, infection, MI, arrhythmia, death, with about 1-2% risk.  Discussed with patient and his wife today. They understand risks, benefits, and would like proceed with RHC/LHC.  Chronic systolic heart failure ([facility]): Nonischemic cardiomyopathy,.  EF now normalized (echocardiogram 08/2020) Appears euvolemic. Continue baseline GDMT for heart failure. Unable to use Entresto or spironolactone due to CKD.   Paroxysmal Afib: Now maintaining sinus rhythm s/p cardioversion  08/14/2018. CHA2DS2VASc score 3, annual stroke risk 3% Continue eliquis 5 mg bid.   Essential hypertension: Controlled.  OSA on CPAP: Tolerating well  F/u after cath    Shalunda Lindh J Shylah Dossantos, MD Pager: [phone] Office: [phone]  

## 2021-11-07 NOTE — Progress Notes (Signed)
Follow up visit  Subjective:   Stephen Maldonado, male    DOB: July 29, 1955, 66 y.o.   MRN: [id number]   Chief Complaint  Patient presents with   Aortic Stenosis   Follow-up    66 y.o. Caucasian male with hypertension, hyperlipidemia, h/o NICM with recovered LVEF, PAF, moderate obesity, OSA, now with moderate to severe aortic stenosis.  Patient is here today with his wife. He continues to have exertional lightheadedness symptoms. He also feels very "lousy and fatigues" as ell as short of breath with minimal amount of walking. He denies chest pain.     Current Outpatient Medications:    apixaban (ELIQUIS) 5 MG TABS tablet, Take 1 tablet (5 mg total) by mouth 2 (two) times daily., Disp: 180 tablet, Rfl: 3   carvedilol (COREG) 12.5 MG tablet, Take 1 tablet (12.5 mg total) by mouth 2 (two) times daily with a meal., Disp: 90 tablet, Rfl: 3   Cholecalciferol (VITAMIN D) 50 MCG (2000 UT) tablet, Take 2,000 Units by mouth daily., Disp: , Rfl:    Coenzyme Q10 (COQ-10) 200 MG CAPS, Take 200 mg by mouth daily., Disp: , Rfl:    Colchicine 0.6 MG CAPS, 1 capsule, Disp: , Rfl:    cyclobenzaprine (FLEXERIL) 10 MG tablet, , Disp: , Rfl:    furosemide (LASIX) 80 MG tablet, Take 1 tablet (80 mg total) by mouth 2 (two) times daily., Disp: 90 tablet, Rfl: 2   Garlic 5784 MG CAPS, Take 1,000 mg by mouth daily., Disp: , Rfl:    hydrALAZINE (APRESOLINE) 25 MG tablet, Take 25 mg by mouth 2 (two) times daily with a meal., Disp: , Rfl: 3   isosorbide mononitrate (IMDUR) 30 MG 24 hr tablet, Take 1 tablet (30 mg total) by mouth daily., Disp: 90 tablet, Rfl: 3   Ketotifen Fumarate (ALLERGY EYE DROPS OP), Place 1 drop into both eyes daily as needed (allergies)., Disp: , Rfl:    naphazoline-pheniramine (ALLERGY EYE) 0.025-0.3 % ophthalmic solution, Place 1 drop into both eyes 4 (four) times daily as needed for eye irritation., Disp: , Rfl:    Omega-3 Fatty Acids (FISH OIL) 1000 MG CAPS, Take 1,000 mg by mouth daily.  , Disp: , Rfl:    Potassium Chloride ER 20 MEQ TBCR, Take 20 mEq by mouth daily. with food, Disp: , Rfl:    tamsulosin (FLOMAX) 0.4 MG CAPS capsule, Take 0.4 mg by mouth every morning., Disp: , Rfl:    Turmeric 500 MG TABS, Take 500 mg by mouth daily., Disp: , Rfl:    vitamin B-12 (CYANOCOBALAMIN) 1000 MCG tablet, Take 1,000 mcg by mouth daily., Disp: , Rfl:    vitamin C (ASCORBIC ACID) 500 MG tablet, Take 500 mg by mouth daily. , Disp: , Rfl:     Cardiovascular studies:  EKG 10/10/2021: Sinus rhythm 74 bpm First degree A-V block   Echocardiogram 09/18/2021:  Normal LV systolic function with visual EF 60-65%. Left ventricle cavity  is normal in size. Moderate concentric hypertrophy of the left ventricle.  Normal global wall motion. Doppler evidence of grade I (impaired)  diastolic dysfunction, normal LAP. Calculated EF 64%.  Trileaflet aortic valve with no regurgitation. Severe aortic valve leaflet  thickening with moderate calcification. Moderate to severe aortic valve  stenosis. AVA (VTI) measures 1.6 cm^2. AV Mean Grad measures 34.8 mmHg. AV  Pk Vel measures 3.91 m/s.  Structurally normal tricuspid valve.  Mild tricuspid regurgitation. No  evidence of pulmonary hypertension.   Left/right heart cath 2020:  LM: Normal LAD: No significant disease LCx: Mid LCx focal 60% stenosis. dFR 0.97 (Non-significant) RCA: Prox 30%, mid 50% focal stenoses. LVEDP mildly elevated.   Impression: Nonobstructive CAD Nonischemic cardiomyopathy  Recent labs: 08/20/2021: Glucose 105, BUN/Cr 36/1.98. EGFR 36. Na/K 141/3.9. Rest of the CMP normal H/H 14/44. MCV 85. Platelets 211 HbA1C 6.6% Chol 165, TG 166, HDL 37, LDL 99 TSH 3.1 normal   Review of Systems  Constitutional: Negative for malaise/fatigue.  Cardiovascular:  Positive for leg swelling (Mild, stable). Negative for chest pain and dyspnea on exertion.  Respiratory:  Positive for snoring. Negative for cough and shortness of breath.    Neurological:  Positive for headaches.  All other systems reviewed and are negative.       Vitals:   11/07/21 1449  BP: 101/67  Pulse: 87  Resp: 16  Temp: 98.4 F (36.9 C)  SpO2: 94%     Objective:   Physical Exam Vitals and nursing note reviewed.  Constitutional:      General: He is not in acute distress.    Appearance: He is obese.  Neck:     Vascular: No JVD.  Cardiovascular:     Rate and Rhythm: Normal rate and regular rhythm.     Heart sounds: Normal heart sounds. No murmur heard. Pulmonary:     Effort: Pulmonary effort is normal.     Breath sounds: Normal breath sounds. No wheezing or rales.  Musculoskeletal:     Right lower leg: Edema (1+) present.     Left lower leg: Edema (1+) present.  Skin:    Comments: Sin abrasions on shin          Assessment & Recommendations:    66 y.o. Caucasian male with hypertension, hyperlipidemia, h/o NICM with recovered LVEF, PAF, moderate obesity, OSA, now with moderate to severe aortic stenosis.  Aortic stenosis: Vmax 3.9 m/sec, mean PG 35 mmHg, AVA 1.6 cm2 with AVAi 0.6 cm2, dimensionless index 0.44. Moderate to severe AS. I am concerned given his exertional lightheadedness and dyspnea symptoms. Given somewhat conflicting data re: severity of aortic stenosis and his symptoms, I recommend invasive hemodynamic assessment with RHC/LHC and coronary angiography. Discussed benefits and risks-specifically CIN, risk of stroke, in addition to bleeding, infection, MI, arrhythmia, death, with about 1-2% risk.  Discussed with patient and his wife today. They understand risks, benefits, and would like proceed with RHC/LHC.  Chronic systolic heart failure ([place]): Nonischemic cardiomyopathy,.  EF now normalized (echocardiogram 08/2020) Appears euvolemic. Continue baseline GDMT for heart failure. Unable to use Entresto or spironolactone due to CKD.   Paroxysmal Afib: Now maintaining sinus rhythm s/p cardioversion  08/14/2018. CHA2DS2VASc score 3, annual stroke risk 3% Continue eliquis 5 mg bid.   Essential hypertension: Controlled.  OSA on CPAP: Tolerating well  F/u after cath    Nigel Mormon, MD Pager: [phone] Office: [phone]

## 2021-11-08 LAB — BASIC METABOLIC PANEL
BUN/Creatinine Ratio: 24 (ref 10–24)
BUN: 50 mg/dL — ABNORMAL HIGH (ref 8–27)
CO2: 26 mmol/L (ref 20–29)
Calcium: 10.1 mg/dL (ref 8.6–10.2)
Chloride: 93 mmol/L — ABNORMAL LOW (ref 96–106)
Creatinine, Ser: 2.12 mg/dL — ABNORMAL HIGH (ref 0.76–1.27)
Glucose: 138 mg/dL — ABNORMAL HIGH (ref 70–99)
Potassium: 3.4 mmol/L — ABNORMAL LOW (ref 3.5–5.2)
Sodium: 140 mmol/L (ref 134–144)
eGFR: 34 mL/min/{1.73_m2} — ABNORMAL LOW (ref >59)

## 2021-11-08 LAB — CBC
Hematocrit: 45.3 % (ref 37.5–51.0)
Hemoglobin: 15.2 g/dL (ref 13.0–17.7)
MCH: 27.9 pg (ref 26.6–33.0)
MCHC: 33.6 g/dL (ref 31.5–35.7)
MCV: 83 fL (ref 79–97)
Platelets: 242 10*3/uL (ref 150–450)
RBC: 5.45 x10E6/uL (ref 4.14–5.80)
RDW: 13.9 % (ref 11.6–15.4)
WBC: 13 10*3/uL — ABNORMAL HIGH (ref 3.4–10.8)

## 2021-11-13 ENCOUNTER — Other Ambulatory Visit: Payer: Self-pay

## 2021-11-13 ENCOUNTER — Other Ambulatory Visit: Payer: Self-pay | Admitting: Cardiology

## 2021-11-13 ENCOUNTER — Encounter (HOSPITAL_COMMUNITY)
Admission: RE | Disposition: A | Discharge status: Home / Self Care | Payer: Self-pay | Source: Home / Self Care | Attending: Cardiology

## 2021-11-13 ENCOUNTER — Ambulatory Visit (HOSPITAL_COMMUNITY)
Admission: RE | Admit: 2021-11-13 | Discharge: 2021-11-13 | Disposition: A | Discharge status: Home / Self Care | Payer: Medicare HMO | Attending: Cardiology | Admitting: Cardiology

## 2021-11-13 DIAGNOSIS — I130 Hypertensive heart and chronic kidney disease with heart failure and stage 1 through stage 4 chronic kidney disease, or unspecified chronic kidney disease: Secondary | ICD-10-CM | POA: Insufficient documentation

## 2021-11-13 DIAGNOSIS — I428 Other cardiomyopathies: Secondary | ICD-10-CM | POA: Insufficient documentation

## 2021-11-13 DIAGNOSIS — I272 Pulmonary hypertension, unspecified: Secondary | ICD-10-CM | POA: Diagnosis not present

## 2021-11-13 DIAGNOSIS — I350 Nonrheumatic aortic (valve) stenosis: Secondary | ICD-10-CM

## 2021-11-13 DIAGNOSIS — I2510 Atherosclerotic heart disease of native coronary artery without angina pectoris: Secondary | ICD-10-CM | POA: Insufficient documentation

## 2021-11-13 DIAGNOSIS — Z6839 Body mass index (BMI) 39.0-39.9, adult: Secondary | ICD-10-CM | POA: Insufficient documentation

## 2021-11-13 DIAGNOSIS — G4733 Obstructive sleep apnea (adult) (pediatric): Secondary | ICD-10-CM | POA: Diagnosis not present

## 2021-11-13 DIAGNOSIS — I480 Paroxysmal atrial fibrillation: Secondary | ICD-10-CM | POA: Diagnosis not present

## 2021-11-13 DIAGNOSIS — E669 Obesity, unspecified: Secondary | ICD-10-CM | POA: Insufficient documentation

## 2021-11-13 DIAGNOSIS — Z7901 Long term (current) use of anticoagulants: Secondary | ICD-10-CM | POA: Insufficient documentation

## 2021-11-13 DIAGNOSIS — I5022 Chronic systolic (congestive) heart failure: Secondary | ICD-10-CM | POA: Insufficient documentation

## 2021-11-13 DIAGNOSIS — E785 Hyperlipidemia, unspecified: Secondary | ICD-10-CM | POA: Diagnosis not present

## 2021-11-13 DIAGNOSIS — N189 Chronic kidney disease, unspecified: Secondary | ICD-10-CM | POA: Diagnosis not present

## 2021-11-13 DIAGNOSIS — Z79899 Other long term (current) drug therapy: Secondary | ICD-10-CM | POA: Diagnosis not present

## 2021-11-13 DIAGNOSIS — R0609 Other forms of dyspnea: Secondary | ICD-10-CM | POA: Insufficient documentation

## 2021-11-13 HISTORY — PX: RIGHT/LEFT HEART CATH AND CORONARY ANGIOGRAPHY: CATH118266

## 2021-11-13 LAB — BASIC METABOLIC PANEL
Anion gap: 8 (ref 5–15)
BUN: 28 mg/dL — ABNORMAL HIGH (ref 8–23)
CO2: 27 mmol/L (ref 22–32)
Calcium: 9 mg/dL (ref 8.9–10.3)
Chloride: 105 mmol/L (ref 98–111)
Creatinine, Ser: 1.81 mg/dL — ABNORMAL HIGH (ref 0.61–1.24)
GFR, Estimated: 41 mL/min — ABNORMAL LOW (ref >60)
Glucose, Bld: 148 mg/dL — ABNORMAL HIGH (ref 70–99)
Potassium: 4 mmol/L (ref 3.5–5.1)
Sodium: 140 mmol/L (ref 135–145)

## 2021-11-13 SURGERY — RIGHT/LEFT HEART CATH AND CORONARY ANGIOGRAPHY
Anesthesia: LOCAL

## 2021-11-13 MED ORDER — HEPARIN SODIUM (PORCINE) 1000 UNIT/ML IJ SOLN
INTRAMUSCULAR | Status: DC | PRN
  Administered 2021-11-13: 6000 [IU] via INTRAVENOUS

## 2021-11-13 MED ORDER — SODIUM CHLORIDE 0.9% FLUSH: 3.0000 mL | INTRAVENOUS | Status: DC | PRN

## 2021-11-13 MED ORDER — ONDANSETRON HCL 4 MG/2ML IJ SOLN: 4.0000 mg | Freq: Four times a day (QID) | INTRAMUSCULAR | Status: DC | PRN

## 2021-11-13 MED ORDER — LIDOCAINE HCL (PF) 1 % IJ SOLN
INTRAMUSCULAR | Status: DC | PRN
  Administered 2021-11-13: 15 mL
  Administered 2021-11-13: 2 mL

## 2021-11-13 MED ORDER — HYDRALAZINE HCL 20 MG/ML IJ SOLN: 10.0000 mg | INTRAMUSCULAR | Status: DC | PRN

## 2021-11-13 MED ORDER — MIDAZOLAM HCL 2 MG/2ML IJ SOLN
INTRAMUSCULAR | Status: AC
  Filled 2021-11-13: qty 2

## 2021-11-13 MED ORDER — HEPARIN (PORCINE) IN NACL 1000-0.9 UT/500ML-% IV SOLN
INTRAVENOUS | Status: AC
  Filled 2021-11-13: qty 1000

## 2021-11-13 MED ORDER — HEPARIN SODIUM (PORCINE) 1000 UNIT/ML IJ SOLN
INTRAMUSCULAR | Status: AC
  Filled 2021-11-13: qty 10

## 2021-11-13 MED ORDER — SODIUM CHLORIDE 0.9 % WEIGHT BASED INFUSION: 1.0000 mL/kg/h | INTRAVENOUS | Status: DC

## 2021-11-13 MED ORDER — SODIUM CHLORIDE 0.9 % IV SOLN: 250.0000 mL | INTRAVENOUS | Status: DC | PRN

## 2021-11-13 MED ORDER — SODIUM CHLORIDE 0.9 % IV SOLN: INTRAVENOUS | Status: DC

## 2021-11-13 MED ORDER — VERAPAMIL HCL 2.5 MG/ML IV SOLN
INTRAVENOUS | Status: AC
  Filled 2021-11-13: qty 2

## 2021-11-13 MED ORDER — ACETAMINOPHEN 325 MG PO TABS: 650.0000 mg | ORAL_TABLET | ORAL | Status: DC | PRN

## 2021-11-13 MED ORDER — VERAPAMIL HCL 2.5 MG/ML IV SOLN
INTRAVENOUS | Status: DC | PRN
  Administered 2021-11-13: 10 mL via INTRA_ARTERIAL

## 2021-11-13 MED ORDER — FENTANYL CITRATE (PF) 100 MCG/2ML IJ SOLN
INTRAMUSCULAR | Status: AC
  Filled 2021-11-13: qty 2

## 2021-11-13 MED ORDER — SODIUM CHLORIDE 0.9 % WEIGHT BASED INFUSION
3.0000 mL/kg/h | INTRAVENOUS | Status: AC
  Administered 2021-11-13: 3 mL/kg/h via INTRAVENOUS

## 2021-11-13 MED ORDER — APIXABAN 5 MG PO TABS: 5.0000 mg | ORAL_TABLET | Freq: Two times a day (BID) | ORAL | 3 refills | Status: DC

## 2021-11-13 MED ORDER — FENTANYL CITRATE (PF) 100 MCG/2ML IJ SOLN
INTRAMUSCULAR | Status: DC | PRN
  Administered 2021-11-13 (×2): 25 ug via INTRAVENOUS

## 2021-11-13 MED ORDER — MIDAZOLAM HCL 2 MG/2ML IJ SOLN
INTRAMUSCULAR | Status: DC | PRN
  Administered 2021-11-13 (×2): 1 mg via INTRAVENOUS

## 2021-11-13 MED ORDER — LABETALOL HCL 5 MG/ML IV SOLN: 10.0000 mg | INTRAVENOUS | Status: DC | PRN

## 2021-11-13 MED ORDER — SODIUM CHLORIDE 0.9% FLUSH: 3.0000 mL | Freq: Two times a day (BID) | INTRAVENOUS | Status: DC

## 2021-11-13 MED ORDER — HEPARIN (PORCINE) IN NACL 1000-0.9 UT/500ML-% IV SOLN
INTRAVENOUS | Status: DC | PRN
  Administered 2021-11-13 (×2): 500 mL

## 2021-11-13 MED ORDER — IOHEXOL 350 MG/ML SOLN
INTRAVENOUS | Status: DC | PRN
  Administered 2021-11-13: 35 mL

## 2021-11-13 MED ORDER — LIDOCAINE HCL (PF) 1 % IJ SOLN
INTRAMUSCULAR | Status: AC
  Filled 2021-11-13: qty 30

## 2021-11-13 SURGICAL SUPPLY — 19 items
CATH 5FR JL3.5 JR4 ANG PIG MP (CATHETERS) IMPLANT
CATH BALLN WEDGE 5F 110CM (CATHETERS) IMPLANT
CATH INFINITI 5 FR AL2 (CATHETERS) IMPLANT
CATH INFINITI 5FR AL1 (CATHETERS) IMPLANT
CATH LANGSTON DUAL LUM PIG 6FR (CATHETERS) IMPLANT
CATH SWAN GANZ 7F STRAIGHT (CATHETERS) IMPLANT
GLIDESHEATH SLEND A-KIT 6F 22G (SHEATH) IMPLANT
GUIDEWIRE .025 260CM (WIRE) IMPLANT
GUIDEWIRE INQWIRE 1.5J.035X260 (WIRE) IMPLANT
INQWIRE 1.5J .035X260CM (WIRE) ×2
KIT HEART LEFT (KITS) ×1 IMPLANT
KIT MICROPUNCTURE NIT STIFF (SHEATH) IMPLANT
PACK CARDIAC CATHETERIZATION (CUSTOM PROCEDURE TRAY) ×1 IMPLANT
SHEATH GLIDE SLENDER 4/5FR (SHEATH) IMPLANT
SHEATH PINNACLE 7F 10CM (SHEATH) IMPLANT
TRANSDUCER W/STOPCOCK (MISCELLANEOUS) ×1 IMPLANT
TUBING CIL FLEX 10 FLL-RA (TUBING) ×1 IMPLANT
WIRE EMERALD 3MM-J .025X260CM (WIRE) IMPLANT
WIRE EMERALD ST .035X260CM (WIRE) IMPLANT

## 2021-11-13 NOTE — Interval H&P Note (Signed)
History and Physical Interval Note:  11/13/2021 4:10 PM  Stephen Maldonado  has presented today for surgery, with the diagnosis of aortic stenosis.  The various methods of treatment have been discussed with the patient and family. After consideration of risks, benefits and other options for treatment, the patient has consented to  Procedure(s): RIGHT/LEFT HEART CATH AND CORONARY ANGIOGRAPHY (N/A) as a surgical intervention.  The patient's history has been reviewed, patient examined, no change in status, stable for surgery.  I have reviewed the patient's chart and labs.  Questions were answered to the patient's satisfaction.    2012 Appropriate Use Criteria for Diagnostic Catheterization Home / Select Test of Interest Indication for RHC Valvular Disease Valvular Disease (Right and Left Heart Catheterization or Right Heart Catheterization Alone With or Valvular Disease (Right and Left Heart Catheterization or Right Heart Catheterization Alone With or Without Left Ventriculography and Coronary Angiography) Link Here: [email or web address] Indication:  Preoperative assessment before valvular surgery A (7) Indication: 70; Score 7 245  Stephen Maldonado

## 2021-11-13 NOTE — Progress Notes (Addendum)
Site area: rt femoral venous sheath Site Prior to Removal:  Level 0 Pressure Applied For: 15 minutes Manual:   yes Patient Status During Pull:  stable Post Pull Site:  Level 0 Post Pull Instructions Given:  yes Post Pull Pulses Present: rt dp palpable Dressing Applied:  gauze and tegaderm Bedrest begins @ 1805Comments:

## 2021-11-14 ENCOUNTER — Telehealth: Payer: Self-pay | Admitting: Cardiology

## 2021-11-14 ENCOUNTER — Encounter (HOSPITAL_COMMUNITY): Payer: Self-pay | Admitting: Cardiology

## 2021-11-14 NOTE — Telephone Encounter (Signed)
Received new TAVR consult today in [facility] workqueue. Attempted to reach patient to schedule appointment with no answer and no voicemail availability. Called alternate number on file with no answer and no availability to leave message. Will try again at a later time.   Kathyrn Drown NP-C Structural Heart Team  Pager: [phone] Phone: [phone]

## 2021-11-15 LAB — POCT I-STAT EG7
Acid-Base Excess: 2 mmol/L (ref 0.0–2.0)
Bicarbonate: 27.1 mmol/L (ref 20.0–28.0)
Calcium, Ion: 1.18 mmol/L (ref 1.15–1.40)
HCT: 41 % (ref 39.0–52.0)
Hemoglobin: 13.9 g/dL (ref 13.0–17.0)
O2 Saturation: 71 %
Potassium: 3.6 mmol/L (ref 3.5–5.1)
Sodium: 144 mmol/L (ref 135–145)
TCO2: 28 mmol/L (ref 22–32)
pCO2, Ven: 44.5 mmHg (ref 44–60)
pH, Ven: 7.392 (ref 7.25–7.43)
pO2, Ven: 38 mmHg (ref 32–45)

## 2021-11-16 LAB — POCT I-STAT 7, (LYTES, BLD GAS, ICA,H+H)
Acid-Base Excess: 1 mmol/L (ref 0.0–2.0)
Bicarbonate: 25.5 mmol/L (ref 20.0–28.0)
Calcium, Ion: 1.17 mmol/L (ref 1.15–1.40)
HCT: 40 % (ref 39.0–52.0)
Hemoglobin: 13.6 g/dL (ref 13.0–17.0)
O2 Saturation: 96 %
Potassium: 3.6 mmol/L (ref 3.5–5.1)
Sodium: 144 mmol/L (ref 135–145)
TCO2: 27 mmol/L (ref 22–32)
pCO2 arterial: 39.7 mmHg (ref 32–48)
pH, Arterial: 7.415 (ref 7.35–7.45)
pO2, Arterial: 81 mmHg — ABNORMAL LOW (ref 83–108)

## 2021-11-16 LAB — POCT ACTIVATED CLOTTING TIME: Activated Clotting Time: 191 seconds

## 2021-11-22 ENCOUNTER — Encounter: Payer: Self-pay | Admitting: Physician Assistant

## 2021-11-22 ENCOUNTER — Other Ambulatory Visit: Payer: Self-pay | Admitting: Cardiology

## 2021-11-22 DIAGNOSIS — I350 Nonrheumatic aortic (valve) stenosis: Secondary | ICD-10-CM

## 2021-11-22 NOTE — Progress Notes (Signed)
[facility] Consult Note  Chief Complaint  Patient presents with   New Patient (Initial Visit)    Aortic stenosis   History of Present Illness: 65 yo male with history of CAD, chronic kidney disease, HTN, hyperlipidemia, GERD, prior DVT, non-ischemic cardiomyopathy with recovery of normal LV function, paroxysmal atrial fibrillation, obesity, sleep apnea and moderately severe aortic stenosis who is here today as a new consult, referred by Dr. Virgina Jock, for further discussion regarding his aortic stenosis and possible TAVR. His aortic stenosis has been followed closely by Dr. Virgina Jock. He has recently reported feeling poorly with constant fatigue and dizziness. Echo 09/18/21 with LVEF=60-65%. Moderate LVH. The aortic valve leaflets are thickened and calcified. Mean gradient 34.8 mmHg. Cardiac cath 11/13/21 with severe disease in the mid Circumflex and moderate disease in the RCA. No disease in the LAD. Mean gradient across the aortic valve of 39.7 mmHg. He has chronic kidney disease with baseline creatinine around 2.0. He has paroxysmal atrial fibrillation and is on Eliquis.   He tells me today that he has had progressive dyspnea and fatigue. He has dizziness but no syncope. He has no chest pain. He lives in [place], [name] with his wife. He is retired as an Sales promotion account executive. He has no active dental issues.    Primary Care Physician: Josetta Huddle, MD Primary Cardiologist: Virgina Jock Referring Cardiologist: Virgina Jock  Past Medical History:  Diagnosis Date   Arthritis    knees   Bladder spasms    BPH (benign prostatic hyperplasia)    DVT (deep venous thrombosis) ([place]) ocyt 2016   left leg tx medically   GERD (gastroesophageal reflux disease)    History of colon polyps    hyperplastic 06-14-2010   Hydronephrosis    Hypertension    Obesity, morbid ([place])    Urinary retention     Past Surgical History:  Procedure Laterality Date   CARDIOVERSION N/A 08/14/2018    Procedure: CARDIOVERSION;  Surgeon: Nigel Mormon, MD;  Location: [facility];  Service: Cardiovascular;  Laterality: N/A;   COLONOSCOPY  06/14/2010   CORONARY ANGIOGRAPHY N/A 07/11/2016   Procedure: Coronary Angiography;  Surgeon: Dixie Dials, MD;  Location: [facility];  Service: Cardiovascular;  Laterality: N/A;   CYSTOSCOPY W/ RETROGRADES Bilateral 10/03/2015   Procedure: CYSTOSCOPY WITH RETROGRADE PYELOGRAM;  Surgeon: [name], MD;  Location: [facility];  Service: Urology;  Laterality: Bilateral;   GREEN LIGHT LASER TURP (TRANSURETHRAL RESECTION OF PROSTATE N/A 10/03/2015   Procedure: GREEN LIGHT LASER,  TURP - STAGED(TRANSURETHRAL RESECTION OF PROSTATE;  Surgeon: [name], MD;  Location: [facility];  Service: Urology;  Laterality: N/A;   HAND SURGERY Left    INTRAVASCULAR PRESSURE WIRE/FFR STUDY N/A 07/14/2018   Procedure: INTRAVASCULAR PRESSURE WIRE/FFR STUDY;  Surgeon: Nigel Mormon, MD;  Location: [facility];  Service: Cardiovascular;  Laterality: N/A;   KNEE ARTHROSCOPY     not sure which knee   LEFT HEART CATH AND CORONARY ANGIOGRAPHY N/A 07/14/2018   Procedure: LEFT HEART CATH AND CORONARY ANGIOGRAPHY;  Surgeon: Nigel Mormon, MD;  Location: [facility];  Service: Cardiovascular;  Laterality: N/A;   QUADRICEPS TENDON REPAIR Bilateral left 02/24/2003/   right 2006   RIGHT/LEFT HEART CATH AND CORONARY ANGIOGRAPHY N/A 11/13/2021   Procedure: RIGHT/LEFT HEART CATH AND CORONARY ANGIOGRAPHY;  Surgeon: Nigel Mormon, MD;  Location: [facility];  Service: Cardiovascular;  Laterality: N/A;   TOTAL KNEE ARTHROPLASTY Right 12/01/2015  Procedure: RIGHT TOTAL KNEE ARTHROPLASTY;  Surgeon: [name], MD;  Location: [facility];  Service: Orthopedics;  Laterality: Right;   TOTAL KNEE ARTHROPLASTY Left 01/10/2017   Procedure: LEFT TOTAL KNEE ARTHROPLASTY;  Surgeon: [name], MD;  Location: [facility];  Service: Orthopedics;  Laterality: Left;   TOTAL SHOULDER ARTHROPLASTY Right 01/23/2018   Procedure: RIGHT REVERSE TOTAL SHOULDER ARTHROPLASTY;  Surgeon: Meredith Pel, MD;  Location: [facility];  Service: Orthopedics;  Laterality: Right;    Current Outpatient Medications  Medication Sig Dispense Refill   apixaban (ELIQUIS) 5 MG TABS tablet Take 1 tablet (5 mg total) by mouth 2 (two) times daily. Resume on 10/11 morning 180 tablet 3   carvedilol (COREG) 12.5 MG tablet Take 1 tablet (12.5 mg total) by mouth 2 (two) times daily with a meal. 90 tablet 3   Cholecalciferol (VITAMIN D) 50 MCG (2000 UT) tablet Take 2,000 Units by mouth daily.     Coenzyme Q10 (COQ-10) 200 MG CAPS Take 200 mg by mouth daily.     Colchicine 0.6 MG CAPS 1 capsule     cyclobenzaprine (FLEXERIL) 10 MG tablet      furosemide (LASIX) 80 MG tablet Take 1 tablet (80 mg total) by mouth 2 (two) times daily. 90 tablet 2   Garlic 0962 MG CAPS Take 1,000 mg by mouth daily.     hydrALAZINE (APRESOLINE) 25 MG tablet Take 25 mg by mouth 2 (two) times daily with a meal.  3   Ketotifen Fumarate (ALLERGY EYE DROPS OP) Place 1 drop into both eyes daily as needed (allergies).     naphazoline-pheniramine (ALLERGY EYE) 0.025-0.3 % ophthalmic solution Place 1 drop into both eyes 4 (four) times daily as needed for eye irritation.     Omega-3 Fatty Acids (FISH OIL) 1000 MG CAPS Take 1,000 mg by mouth daily.      Potassium Chloride ER 20 MEQ TBCR Take 20 mEq by mouth daily. with food     tamsulosin (FLOMAX) 0.4 MG CAPS capsule Take 0.4 mg by mouth every morning.     Turmeric 500 MG TABS Take 500 mg by mouth daily.     vitamin B-12 (CYANOCOBALAMIN) 1000 MCG tablet Take 1,000 mcg by mouth daily.     vitamin C (ASCORBIC ACID) 500 MG tablet Take 500 mg by mouth daily.      isosorbide mononitrate (IMDUR) 30 MG 24 hr tablet Take 1 tablet (30 mg total) by mouth daily. 90 tablet 3   No current  facility-administered medications for this visit.    Allergies  Allergen Reactions   Atorvastatin Other (See Comments)    Muscle fatigue, soreness and dreams crazy Other reaction(s): severe arthralgias/myalgias Other reaction(s): severe arthralgias/myalgias   Aspirin Other (See Comments)    Avoid to kidney function Other reaction(s): **Reduced Kidney Function Avoid to kidney function  Other reaction(s): **Reduced Kidney Function   Simvastatin Other (See Comments)    Fatigue, soreness   Statins Other (See Comments)    Fatigue, soreness    Social History   Socioeconomic History   Marital status: Married    Spouse name: Not on file   Number of children: 1   Years of education: Not on file   Highest education level: Not on file  Occupational History   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  Tobacco Use   Smoking status: Never   Smokeless tobacco: Former    Types: Chew    Quit date: 02/05/1968  Vaping Use  Vaping Use: Never used  Substance and Sexual Activity   Alcohol use: Yes    Comment: 1-2 beers per month   Drug use: No   Sexual activity: Not Currently  Other Topics Concern   Not on file  Social History Narrative   Not on file   Social Determinants of Health   Financial Resource Strain: Not on file  Food Insecurity: Not on file  Transportation Needs: Not on file  Physical Activity: Not on file  Stress: Not on file  Social Connections: Not on file  Intimate Partner Violence: Not on file    Family History  Problem Relation Age of Onset   Lung cancer Mother    Breast cancer Mother    Diabetes Mother    Heart failure Father    CAD Father    Diabetes Father     Review of Systems:  As stated in the HPI and otherwise negative.   BP 118/70   Pulse 67   Ht '5\' 11"'$  (1.803 m)   Wt 287 lb 12.8 oz (130.5 kg)   SpO2 98%   BMI 40.14 kg/m   Physical Examination: General: Well developed, well nourished, NAD  HEENT: OP clear, mucus  membranes moist  SKIN: warm, dry. No rashes. Neuro: No focal deficits  Musculoskeletal: Muscle strength 5/5 all ext  Psychiatric: Mood and affect normal  Neck: No JVD, no carotid bruits, no thyromegaly, no lymphadenopathy.  Lungs:Clear bilaterally, no wheezes, rhonci, crackles Cardiovascular: Regular rate and rhythm. Loud, harsh, late peaking systolic murmur.  Abdomen:Soft. Bowel sounds present. Non-tender.  Extremities: No lower extremity edema. Pulses are 2 + in the bilateral DP/PT.  EKG:  EKG is not ordered today. The ekg ordered today demonstrates   Echo 09/18/21: Normal LV systolic function with visual EF 60-65%. Left ventricle cavity  is normal in size. Moderate concentric hypertrophy of the left ventricle.  Normal global wall motion. Doppler evidence of grade I (impaired)  diastolic dysfunction, normal LAP. Calculated EF 64%.  Trileaflet aortic valve with no regurgitation. Severe aortic valve leaflet  thickening with moderate calcification. Moderate to severe aortic valve  stenosis. AVA (VTI) measures 1.6 cm^2. AV Mean Grad measures 34.8 mmHg. AV  Pk Vel measures 3.91 m/s.  Structurally normal tricuspid valve.  Mild tricuspid regurgitation. No  evidence of pulmonary hypertension.  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  Will treat CAD medically at this time.  Unsure he will be a good candidate for SAVR + CABG with his obesity and renal dysfunction. Will refer for TAVR evaluation, with consideration for PCI post TAVR, if necessary.      Nigel Mormon, MD Pager: [phone] Office: [phone]         Indications  Severe aortic stenosis [I35.0 (ICD-10-CM)]   Procedural Details  Technical Details Procedures: 1. Ultrasound guided Rt radial artery and Rt common femoral vein access 2. Right heart catheterization 3. Left heart catheterization 4. Selective left and right coronary angiography 5. Conscious sedation monitoring 64 min  Indication: Aortic stenosis  History: 66 y.o. Caucasian male with hypertension, hyperlipidemia, h/o NICM with recovered LVEF, PAF, moderate obesity, OSA, now with moderate to severe aortic stenosis, exertional dyspnea    Diagnostic Angiography: Catheter/s advances over guidewire under fluoroscopy Left coronary artery: 5 Fr JL 3.5  Right coronary artery: 5 Fr JR 4 Left heart catheterization: 5 Fr AL1-->exchanged for 5 Fr Langston dual lumen catheter   Pressures tracings obtained in right atrium, right ventricle, pulmonary artery, and pulmonary capillary wedge position. Invasive hemodynamic measurements performed using Fick method.     Anticoagulation:  6000 units heparin  Hemostasis: TR band, manual compression  Total contrast used: 35 cc   Total fluoro time: 17.0 min Air Kerma: 1027 mGy  Conscious sedation was administered under my direct supervision. IV Versed 2 mg and fentanyl 50 mcg administered. Continuous ECG, pulse oximetry and blood pressure were monitored throughout the entire procedure. Monitoring corroborated by cath lab nurse and technician.  Total sedation time: 64 minutes.  All wires and catheters removed out of the body at the end of the procedure Final angiogram showed no  dissection/perforation.   [image] Nigel Mormon, MD Pager: [phone] Office: [phone]          Estimated blood loss <50 mL.   During this procedure medications were administered to achieve and maintain moderate conscious sedation while the patient's heart rate, blood pressure, and oxygen saturation were continuously monitored and I was present face-to-face 100% of this time.   Medications (Filter: Administrations occurring from 1601 to 1740 on 11/13/21)  important  Continuous medications are totaled by the amount administered until 11/13/21 1740.   midazolam (VERSED) injection (mg) Total dose:  2 mg  Date/Time Rate/Dose/Volume Action   11/13/21 1615 1 mg Given   1634 1 mg Given    fentaNYL (SUBLIMAZE) injection (mcg) Total dose:  50 mcg  Date/Time Rate/Dose/Volume Action   11/13/21 1615 25 mcg Given   1634 25 mcg Given    lidocaine (PF) (XYLOCAINE) 1 % injection (mL) Total volume:  17 mL  Date/Time Rate/Dose/Volume Action   11/13/21 1619 2 mL Given   1634 15 mL Given    Radial Cocktail/Verapamil only (mL) Total volume:  10 mL  Date/Time Rate/Dose/Volume Action   11/13/21 1625 10 mL Given    heparin sodium (porcine) injection (Units) Total dose:  6,000 Units  Date/Time Rate/Dose/Volume Action   11/13/21 1653 6,000 Units Given    Heparin (Porcine) in NaCl 1000-0.9 UT/500ML-% SOLN (mL) Total volume:  1,000 mL  Date/Time Rate/Dose/Volume Action   11/13/21 1653 500 mL Given   1653 500 mL Given    iohexol (OMNIPAQUE) 350 MG/ML injection (mL) Total volume:  35 mL  Date/Time Rate/Dose/Volume Action   11/13/21 1731 35 mL Given    Sedation Time  Sedation Time Physician-1:  Contrast  Medication Name Total Dose  iohexol (OMNIPAQUE) 350 MG/ML injection 35 mL   Radiation/Fluoro  Fluoro time: 17 (min) DAP: 39767 (mGycm2) Cumulative Air Kerma: 3419 (mGy) Complications  Complications documented before study signed (11/13/2021  3:79  PM)   No complications were associated with this study.  Documented by Luisa Hart - 11/13/2021  5:53 PM     Coronary Findings  Diagnostic Dominance: Right Left Main  Vessel is normal in  caliber. Vessel is angiographically normal.    Left Anterior Descending  Vessel is normal in caliber. Vessel is angiographically normal.    Ramus Intermedius  Vessel is small.    Left Circumflex  Vessel is normal in caliber. Vessel is angiographically normal.  Mid Cx lesion is 80% stenosed.    Right Coronary Artery  Vessel is large.  Prox RCA lesion is 60% stenosed. Vessel is not the culprit lesion. The lesion is type A, located at the bend and eccentric. The lesion was not previously treated .  Mid RCA lesion is 40% stenosed. Vessel is not the culprit lesion. The lesion is type A and eccentric. The lesion was not previously treated .    Intervention   No interventions have been documented.   Right Heart  Right Atrium RA: 6 mmHg RV: 49/3 mmHg PA: 50/18 mmHg, mPAP 29 mmHg PCW: 9 mmHg   Left Heart  Left Ventricle Simultaneous LV-Ao measurement (using Langston dual lumen catheter) LV: 189/4 mmHg AO: 150/42 mmHg  CO: 6.2 L/min CI: 2.5 L/min/m2  Peak-to-peak gradient 30 mmHg Mean PG 39.7 mmHg AVA: 0.98 cm2   Coronary Diagrams  Diagnostic Dominance: Right  Intervention  Implants     No implant documentation for this case.   Syngo Images   Show images for CARDIAC CATHETERIZATION Images on Long Term Storage   Show images for Reginald, [place] to Procedure Log  Procedure Log    Hemo Data  Flowsheet Row Most Recent Value  Fick Cardiac Output 6.26 L/min  Fick Cardiac Output Index 2.57 (L/min)/BSA  Aortic Mean Gradient 43.36 mmHg  Aortic Peak Gradient 37.4 mmHg  Aortic Valve Area 0.91  Aortic Value Area Index 0.38 cm2/BSA  RA A Wave 14 mmHg  RA V Wave 12 mmHg  RA Mean 6 mmHg  RV Systolic Pressure 49 mmHg  RV Diastolic Pressure 3 mmHg  RV EDP 10 mmHg   PA Systolic Pressure 50 mmHg  PA Diastolic Pressure 18 mmHg  PA Mean 29 mmHg  PW A Wave 16 mmHg  PW V Wave 14 mmHg  PW Mean 9 mmHg  AO Systolic Pressure 097 mmHg  AO Diastolic Pressure 76 mmHg  AO Mean 353 mmHg  LV Systolic Pressure 299 mmHg  LV Diastolic Pressure 4 mmHg  LV EDP 18 mmHg  AOp Systolic Pressure 242 mmHg  AOp Diastolic Pressure 76 mmHg  AOp Mean Pressure 683 mmHg  LVp Systolic Pressure 419 mmHg  LVp Diastolic Pressure 9 mmHg  LVp EDP Pressure 24 mmHg  QP/QS 1  TPVR Index 11.26 HRUI  TSVR Index 41.19 HRUI  PVR SVR Ratio 0.2  TPVR/TSVR Ratio 0.27    Recent Labs: 11/07/2021: Platelets 242 11/13/2021: BUN 28; Creatinine, Ser 1.81; Hemoglobin 13.9; Hemoglobin 13.6; Potassium 3.6; Potassium 3.6; Sodium 144; Sodium 144    Wt Readings from Last 3 Encounters:  11/23/21 287 lb 12.8 oz (130.5 kg)  11/13/21 280 lb (127 kg)  11/07/21 284 lb (128.8 kg)    Assessment and Plan:   1. Severe Aortic Valve Stenosis: He has moderately severe to severe aortic valve stenosis by echo criteria but the valve appears to have severe stenosis. NYHA class II.  I have personally reviewed the echo images. The aortic valve is thickened, calcified with limited leaflet mobility. I think he would benefit from AVR. Given his obesity and CKD, he may be a better candidate for TAVR and PCI rather than surgical AVR and CABG.    I have reviewed the natural  history of aortic stenosis with the patient and their family members  who are present today. We have discussed the limitations of medical therapy and the poor prognosis associated with symptomatic aortic stenosis. We have reviewed potential treatment options, including palliative medical therapy, conventional surgical aortic valve replacement, and transcatheter aortic valve replacement. We discussed treatment options in the context of the patient's specific comorbid medical conditions.   He would like to proceed with planning for TAVR. Risks and  benefits of the valve procedure are reviewed with the patient. I will plan a cardiac CT, CTA of the chest/abdomen and pelvis now. He will then be referred to see one of the CT surgeons on our TAVR team.    Labs/ tests ordered today include:   Orders Placed This Encounter  Procedures   Basic Metabolic Panel (BMET)    Disposition:   F/U with the valve team.    Signed, Lauree Chandler, MD, [facility] 11/23/2021 10:19 AM    [address], [place], [place]  [address] Phone: [phone]; Fax: [phone]

## 2021-11-23 ENCOUNTER — Ambulatory Visit: Payer: Medicare HMO | Attending: Cardiovascular Disease | Admitting: Cardiovascular Disease

## 2021-11-23 ENCOUNTER — Encounter: Payer: Self-pay | Admitting: Cardiovascular Disease

## 2021-11-23 VITALS — BP 118/70 | HR 67 | Ht 71.0 in | Wt 287.8 lb

## 2021-11-23 DIAGNOSIS — Z01812 Encounter for preprocedural laboratory examination: Secondary | ICD-10-CM | POA: Diagnosis not present

## 2021-11-23 DIAGNOSIS — I350 Nonrheumatic aortic (valve) stenosis: Secondary | ICD-10-CM | POA: Diagnosis not present

## 2021-11-23 LAB — BASIC METABOLIC PANEL
BUN/Creatinine Ratio: 18 (ref 10–24)
BUN: 33 mg/dL — ABNORMAL HIGH (ref 8–27)
CO2: 26 mmol/L (ref 20–29)
Calcium: 9.6 mg/dL (ref 8.6–10.2)
Chloride: 100 mmol/L (ref 96–106)
Creatinine, Ser: 1.84 mg/dL — ABNORMAL HIGH (ref 0.76–1.27)
Glucose: 133 mg/dL — ABNORMAL HIGH (ref 70–99)
Potassium: 3.8 mmol/L (ref 3.5–5.2)
Sodium: 140 mmol/L (ref 134–144)
eGFR: 40 mL/min/{1.73_m2} — ABNORMAL LOW (ref >59)

## 2021-11-23 NOTE — Patient Instructions (Signed)
Medication Instructions:  No changes *If you need a refill on your cardiac medications before your next appointment, please call your pharmacy*   Lab Work: none If you have labs (blood work) drawn today and your tests are completely normal, you will receive your results only by: [facility] (if you have MyChart) OR A paper copy in the mail If you have any lab test that is abnormal or we need to change your treatment, we will call you to review the results.   Testing/Procedures: CTs as planned - see instruction letter   Follow-Up: Per [facility] Heart Team

## 2021-11-23 NOTE — Progress Notes (Addendum)
Pre Surgical Assessment: 5 M Walk Test  42M=16.75f  5 Meter Walk Test- trial 1: 4.60 seconds 5 Meter Walk Test- trial 2: 4.40 seconds 5 Meter Walk Test- trial 3: 4.67 seconds 5 Meter Walk Test Average: 4.55 seconds  _____________________  STS score: isolated AVR: 2.97%

## 2021-11-27 ENCOUNTER — Ambulatory Visit: Payer: Medicare HMO

## 2021-11-27 VITALS — BP 122/75 | HR 70 | Temp 98.0°F | Resp 14 | Ht 71.0 in | Wt 292.0 lb

## 2021-11-27 DIAGNOSIS — I10 Essential (primary) hypertension: Secondary | ICD-10-CM

## 2021-11-27 DIAGNOSIS — I480 Paroxysmal atrial fibrillation: Secondary | ICD-10-CM

## 2021-11-27 DIAGNOSIS — R0609 Other forms of dyspnea: Secondary | ICD-10-CM

## 2021-11-27 DIAGNOSIS — I350 Nonrheumatic aortic (valve) stenosis: Secondary | ICD-10-CM | POA: Diagnosis not present

## 2021-11-27 DIAGNOSIS — I5022 Chronic systolic (congestive) heart failure: Secondary | ICD-10-CM

## 2021-11-27 NOTE — Progress Notes (Signed)
Follow up visit  Subjective:   Stephen Maldonado, male    DOB: 1955/07/29, 66 y.o.   MRN: [id number]   Chief Complaint  Patient presents with   Severe aortic stenosis   Follow-up    2 week post cath   66 y.o. Caucasian male with hypertension, hyperlipidemia, h/o NICM with recovered LVEF, PAF, moderate obesity, OSA, now with moderate to severe aortic stenosis.  He presents today for follow-up following cardiac catheterization.  After catheterization he was seen by structural heart clinic for evaluation of his aortic stenosis and possible TAVR.  He does endorse progressive dyspnea and fatigue.  He has had dizziness but no syncope.  He denies chest pain.   Current Outpatient Medications:    apixaban (ELIQUIS) 5 MG TABS tablet, Take 1 tablet (5 mg total) by mouth 2 (two) times daily. Resume on 10/11 morning, Disp: 180 tablet, Rfl: 3   carvedilol (COREG) 12.5 MG tablet, Take 1 tablet (12.5 mg total) by mouth 2 (two) times daily with a meal., Disp: 90 tablet, Rfl: 3   Cholecalciferol (VITAMIN D) 50 MCG (2000 UT) tablet, Take 2,000 Units by mouth daily., Disp: , Rfl:    Coenzyme Q10 (COQ-10) 200 MG CAPS, Take 200 mg by mouth daily., Disp: , Rfl:    Colchicine 0.6 MG CAPS, 1 capsule, Disp: , Rfl:    cyclobenzaprine (FLEXERIL) 10 MG tablet, , Disp: , Rfl:    furosemide (LASIX) 80 MG tablet, Take 1 tablet (80 mg total) by mouth 2 (two) times daily., Disp: 90 tablet, Rfl: 2   Garlic 6578 MG CAPS, Take 1,000 mg by mouth daily., Disp: , Rfl:    hydrALAZINE (APRESOLINE) 25 MG tablet, Take 25 mg by mouth 2 (two) times daily with a meal., Disp: , Rfl: 3   isosorbide mononitrate (IMDUR) 30 MG 24 hr tablet, Take 1 tablet (30 mg total) by mouth daily., Disp: 90 tablet, Rfl: 3   Ketotifen Fumarate (ALLERGY EYE DROPS OP), Place 1 drop into both eyes daily as needed (allergies)., Disp: , Rfl:    naphazoline-pheniramine (ALLERGY EYE) 0.025-0.3 % ophthalmic solution, Place 1 drop into both eyes 4 (four) times  daily as needed for eye irritation., Disp: , Rfl:    Omega-3 Fatty Acids (FISH OIL) 1000 MG CAPS, Take 1,000 mg by mouth daily. , Disp: , Rfl:    Potassium Chloride ER 20 MEQ TBCR, Take 20 mEq by mouth daily. with food, Disp: , Rfl:    tamsulosin (FLOMAX) 0.4 MG CAPS capsule, Take 0.4 mg by mouth every morning., Disp: , Rfl:    Turmeric 500 MG TABS, Take 500 mg by mouth daily., Disp: , Rfl:    vitamin B-12 (CYANOCOBALAMIN) 1000 MCG tablet, Take 1,000 mcg by mouth daily., Disp: , Rfl:    vitamin C (ASCORBIC ACID) 500 MG tablet, Take 500 mg by mouth daily. , Disp: , Rfl:   Cardiovascular studies:  EKG 10/10/2021: Sinus rhythm 74 bpm First degree A-V block   Echocardiogram 09/18/2021:  Normal LV systolic function with visual EF 60-65%. Left ventricle cavity  is normal in size. Moderate concentric hypertrophy of the left ventricle.  Normal global wall motion. Doppler evidence of grade I (impaired)  diastolic dysfunction, normal LAP. Calculated EF 64%.  Trileaflet aortic valve with no regurgitation. Severe aortic valve leaflet  thickening with moderate calcification. Moderate to severe aortic valve  stenosis. AVA (VTI) measures 1.6 cm^2. AV Mean Grad measures 34.8 mmHg. AV  Pk Vel measures 3.91 m/s.  Structurally  normal tricuspid valve.  Mild tricuspid regurgitation. No  evidence of pulmonary hypertension.   Left/right heart cath 11/13/2021: LM: Normal LAD: No significant disease Lcx: Mid focal 80% stenosis (60% with iFR 0.97 in 2020) RCA: Prox 60% (30% in 2020), mid 40% (unchanged since 2020)  While there is progression of coronary artery disease since 2020, his primary complaint is exertional dyspnea, which correlated with his severe aortic stenosis, as detailed above.  Will treat CAD medically at this time.   Recent labs: 11/23/2021: Sodium 140, potassium 3.8, glucose 133, BUN 33, creatinine 1.84  11/07/2021: 15.2, hematocrit 45.3, MCV 83, platelets 242  08/20/2021: Glucose 105,  BUN/Cr 36/1.98. EGFR 36. Na/K 141/3.9. Rest of the CMP normal H/H 14/44. MCV 85. Platelets 211 HbA1C 6.6% Chol 165, TG 166, HDL 37, LDL 99 TSH 3.1 normal  Review of Systems  Constitutional: Negative for malaise/fatigue.  Cardiovascular:  Positive for leg swelling (Mild, stable). Negative for chest pain and dyspnea on exertion.  Respiratory:  Positive for snoring. Negative for cough and shortness of breath.   Neurological:  Positive for headaches.  All other systems reviewed and are negative.   Vitals:   11/27/21 1419  BP: 122/75  Pulse: 70  Resp: 14  Temp: 98 F (36.7 C)  SpO2: 97%   Objective:   Physical Exam Vitals and nursing note reviewed.  Constitutional:      General: He is not in acute distress.    Appearance: He is obese.  Neck:     Vascular: No JVD.  Cardiovascular:     Rate and Rhythm: Normal rate and regular rhythm.     Heart sounds: Normal heart sounds. No murmur heard. Pulmonary:     Effort: Pulmonary effort is normal.     Breath sounds: Normal breath sounds. No wheezing or rales.  Musculoskeletal:     Right lower leg: No edema.     Left lower leg: No edema.    Assessment & Recommendations:   66 y.o. Caucasian male with hypertension, hyperlipidemia, h/o NICM with recovered LVEF, PAF, moderate obesity, OSA, now with moderate to severe aortic stenosis.  Severe aortic stenosis (Vmax 3.9 m/sec, mean PG 35 mmHg, AVA 1.6 cm2 with AVAi 0.6 cm2, dimensionless index 0.44.) Dyspnea on exertion He underwent right/left coronary angiography on 11/13/2021 which revealed progression of coronary artery disease since 2020 (severe disease in the mid circumflex and moderate disease in the RCA), however his primary complaint of exertional dyspnea, correlates with his severe aortic stenosis He is currently being followed by the structural heart team.  Plan is to proceed with TAVR. He will undergo cardiac CT, CTA of the chest and abdomen and pelvis prior to referral to CT  surgery for TAVR.  Following aortic valve repair he will be evaluated for possible PCI vs medical management of CAD depending on symptoms. Continue Carvedilol. Reviewed previous lipid panel, we discussed start rosuvastatin however given history of side effects with statins he would like to hold off for now. Will recheck lipid panel.  Chronic systolic heart failure (HCC) Nonischemic cardiomyopathy. EF now normalized (echocardiogram 08/2020). No clinical evidence of heart failure. Continue baseline GDMT for heart failure. Unable to use Entresto or spironolactone due to CKD.   Primary hypertension Blood pressure controlled, continue current medications without changes.  Paroxysmal Afib: Now maintaining sinus rhythm s/p cardioversion 08/14/2018. CHA2DS2VASc score 3, annual stroke risk 3%. Tolerating Eliquis 52m without bleeding diathesis.    OSA on CPAP: Tolerating well.  Patient was seen and examined and  plan was formulated in conjunction with Dr. Rhodia Albright.  Follow-up in 3 months or sooner if needed.   Ernst Spell, [place] Pager: [phone] Office: [phone]

## 2021-12-04 ENCOUNTER — Encounter (HOSPITAL_COMMUNITY)
Admission: RE | Admit: 2021-12-04 | Discharge: 2021-12-04 | Disposition: A | Discharge status: Home / Self Care | Payer: Medicare HMO | Source: Ambulatory Visit | Attending: Cardiovascular Disease | Admitting: Cardiovascular Disease

## 2021-12-04 ENCOUNTER — Ambulatory Visit (HOSPITAL_COMMUNITY)
Admission: RE | Admit: 2021-12-04 | Discharge: 2021-12-04 | Disposition: A | Discharge status: Home / Self Care | Payer: Medicare HMO | Source: Ambulatory Visit | Attending: Cardiology | Admitting: Cardiology

## 2021-12-04 DIAGNOSIS — I350 Nonrheumatic aortic (valve) stenosis: Secondary | ICD-10-CM | POA: Diagnosis not present

## 2021-12-04 DIAGNOSIS — N1832 Chronic kidney disease, stage 3b: Secondary | ICD-10-CM | POA: Insufficient documentation

## 2021-12-04 DIAGNOSIS — I700 Atherosclerosis of aorta: Secondary | ICD-10-CM | POA: Diagnosis not present

## 2021-12-04 LAB — BASIC METABOLIC PANEL
Anion gap: 10 (ref 5–15)
BUN: 30 mg/dL — ABNORMAL HIGH (ref 8–23)
CO2: 25 mmol/L (ref 22–32)
Calcium: 9.6 mg/dL (ref 8.9–10.3)
Chloride: 104 mmol/L (ref 98–111)
Creatinine, Ser: 1.9 mg/dL — ABNORMAL HIGH (ref 0.61–1.24)
GFR, Estimated: 38 mL/min — ABNORMAL LOW (ref >60)
Glucose, Bld: 132 mg/dL — ABNORMAL HIGH (ref 70–99)
Potassium: 3.9 mmol/L (ref 3.5–5.1)
Sodium: 139 mmol/L (ref 135–145)

## 2021-12-04 MED ORDER — IOHEXOL 350 MG/ML SOLN
100.0000 mL | Freq: Once | INTRAVENOUS | Status: AC | PRN
  Administered 2021-12-04: 100 mL via INTRAVENOUS

## 2021-12-04 MED ORDER — SODIUM CHLORIDE 0.9 % WEIGHT BASED INFUSION: 1.0000 mL/kg/h | INTRAVENOUS | Status: AC

## 2021-12-04 MED ORDER — SODIUM CHLORIDE 0.9 % WEIGHT BASED INFUSION
3.0000 mL/kg/h | INTRAVENOUS | Status: AC
  Administered 2021-12-04: 3.063 mL/kg/h via INTRAVENOUS

## 2021-12-13 ENCOUNTER — Institutional Professional Consult (permissible substitution) (INDEPENDENT_AMBULATORY_CARE_PROVIDER_SITE_OTHER): Payer: Medicare HMO | Admitting: Cardiothoracic Surgery

## 2021-12-13 VITALS — BP 150/90 | HR 68 | Resp 20 | Ht 71.0 in | Wt 285.0 lb

## 2021-12-13 DIAGNOSIS — I350 Nonrheumatic aortic (valve) stenosis: Secondary | ICD-10-CM | POA: Diagnosis not present

## 2021-12-16 NOTE — Progress Notes (Signed)
[address]       [place] [address]             [phone]                    Hillary A Delpizzo [facility]ier Medical Record #[id number] Date of Birth: May 14, 1955  Referring: Nigel Mormon, MD Primary Care: Josetta Huddle, MD Primary Cardiologist: None  Chief Complaint:    Chief Complaint  Patient presents with   Aortic Stenosis    Surgical consult for TAVR v/s SAVR, review all testing    History of Present Illness:     66 y.o. Caucasian male with hypertension, hyperlipidemia, h/o NICM with recovered LVEF, PAF, moderate obesity, OSA, now with moderate to severe aortic stenosis.   Patient is here today with his wife.   He does have increasing fatigue. Not able to do what he did a year ago.       Past Medical History:  Diagnosis Date   Arthritis    knees   Bladder spasms    BPH (benign prostatic hyperplasia)    DVT (deep venous thrombosis) ([place]) ocyt 2016   left leg tx medically   GERD (gastroesophageal reflux disease)    History of colon polyps    hyperplastic 06-14-2010   Hydronephrosis    Hypertension    Obesity, morbid ([place])    Urinary retention     Past Surgical History:  Procedure Laterality Date   CARDIOVERSION N/A 08/14/2018   Procedure: CARDIOVERSION;  Surgeon: Nigel Mormon, MD;  Location: [facility];  Service: Cardiovascular;  Laterality: N/A;   COLONOSCOPY  06/14/2010   CORONARY ANGIOGRAPHY N/A 07/11/2016   Procedure: Coronary Angiography;  Surgeon: Dixie Dials, MD;  Location: [facility];  Service: Cardiovascular;  Laterality: N/A;   CYSTOSCOPY W/ RETROGRADES Bilateral 10/03/2015   Procedure: CYSTOSCOPY WITH RETROGRADE PYELOGRAM;  Surgeon: [name], MD;  Location[facility];  Service: Urology;  Laterality: Bilateral;   GREEN LIGHT LASER TURP (TRANSURETHRAL RESECTION OF PROSTATE N/A 10/03/2015   Procedure: GREEN LIGHT LASER,  TURP - STAGED(TRANSURETHRAL RESECTION OF PROSTATE;   Surgeon: [name], MD;  Location: [facility];  Service: Urology;  Laterality: N/A;   HAND SURGERY Left    INTRAVASCULAR PRESSURE WIRE/FFR STUDY N/A 07/14/2018   Procedure: INTRAVASCULAR PRESSURE WIRE/FFR STUDY;  Surgeon: Nigel Mormon, MD;  Location: [facility];  Service: Cardiovascular;  Laterality: N/A;   KNEE ARTHROSCOPY     not sure which knee   LEFT HEART CATH AND CORONARY ANGIOGRAPHY N/A 07/14/2018   Procedure: LEFT HEART CATH AND CORONARY ANGIOGRAPHY;  Surgeon: Nigel Mormon, MD;  Location: [facility];  Service: Cardiovascular;  Laterality: N/A;   QUADRICEPS TENDON REPAIR Bilateral left 02/24/2003/   right 2006   RIGHT/LEFT HEART CATH AND CORONARY ANGIOGRAPHY N/A 11/13/2021   Procedure: RIGHT/LEFT HEART CATH AND CORONARY ANGIOGRAPHY;  Surgeon: Nigel Mormon, MD;  Location: [facility];  Service: Cardiovascular;  Laterality: N/A;   TOTAL KNEE ARTHROPLASTY Right 12/01/2015   Procedure: RIGHT TOTAL KNEE ARTHROPLASTY;  Surgeon: [name], MD;  Location: [facility];  Service: Orthopedics;  Laterality: Right;   TOTAL KNEE ARTHROPLASTY Left 01/10/2017   Procedure: LEFT TOTAL KNEE ARTHROPLASTY;  Surgeon: [name], MD;  Location: [facility];  Service: Orthopedics;  Laterality: Left;   TOTAL SHOULDER ARTHROPLASTY Right 01/23/2018   Procedure: RIGHT REVERSE TOTAL SHOULDER ARTHROPLASTY;  Surgeon: Meredith Pel, MD;  Location: [facility];  Service: Orthopedics;  Laterality: Right;    Family History  Problem Relation Age of Onset   Lung cancer Mother    Breast cancer Mother    Diabetes Mother    Heart failure Father    CAD Father    Diabetes Father      Social History   Tobacco Use  Smoking Status Never  Smokeless Tobacco Former   Types: Chew   Quit date: 02/05/1968    Social History   Substance and Sexual Activity  Alcohol Use Yes   Comment: 1-2 beers per month     Allergies  Allergen  Reactions   Atorvastatin Other (See Comments)    Muscle fatigue, soreness and dreams crazy Other reaction(s): severe arthralgias/myalgias Other reaction(s): severe arthralgias/myalgias   Aspirin Other (See Comments)    Avoid to kidney function Other reaction(s): **Reduced Kidney Function Avoid to kidney function  Other reaction(s): **Reduced Kidney Function   Simvastatin Other (See Comments)    Fatigue, soreness   Statins Other (See Comments)    Fatigue, soreness    Current Outpatient Medications  Medication Sig Dispense Refill   apixaban (ELIQUIS) 5 MG TABS tablet Take 1 tablet (5 mg total) by mouth 2 (two) times daily. Resume on 10/11 morning 180 tablet 3   carvedilol (COREG) 12.5 MG tablet Take 1 tablet (12.5 mg total) by mouth 2 (two) times daily with a meal. 90 tablet 3   Cholecalciferol (VITAMIN D) 50 MCG (2000 UT) tablet Take 2,000 Units by mouth daily.     Coenzyme Q10 (COQ-10) 200 MG CAPS Take 200 mg by mouth daily.     cyclobenzaprine (FLEXERIL) 10 MG tablet      furosemide (LASIX) 80 MG tablet Take 1 tablet (80 mg total) by mouth 2 (two) times daily. 90 tablet 2   Garlic 2595 MG CAPS Take 1,000 mg by mouth daily.     hydrALAZINE (APRESOLINE) 25 MG tablet Take 25 mg by mouth 2 (two) times daily with a meal.  3   naphazoline-pheniramine (ALLERGY EYE) 0.025-0.3 % ophthalmic solution Place 1 drop into both eyes 4 (four) times daily as needed for eye irritation.     Omega-3 Fatty Acids (FISH OIL) 1000 MG CAPS Take 1,000 mg by mouth daily.      Potassium Chloride ER 20 MEQ TBCR Take 20 mEq by mouth daily. with food     tamsulosin (FLOMAX) 0.4 MG CAPS capsule Take 0.4 mg by mouth every morning.     Turmeric 500 MG TABS Take 500 mg by mouth daily.     vitamin B-12 (CYANOCOBALAMIN) 1000 MCG tablet Take 1,000 mcg by mouth daily.     vitamin C (ASCORBIC ACID) 500 MG tablet Take 500 mg by mouth daily.      Colchicine 0.6 MG CAPS 1 capsule (Patient not taking: Reported on 12/13/2021)      isosorbide mononitrate (IMDUR) 30 MG 24 hr tablet Take 1 tablet (30 mg total) by mouth daily. 90 tablet 3   Ketotifen Fumarate (ALLERGY EYE DROPS OP) Place 1 drop into both eyes daily as needed (allergies). (Patient not taking: Reported on 12/13/2021)     No current facility-administered medications for this visit.    ROS 14 point ROS reviewed and negative except as per HPI   PHYSICAL EXAMINATION: BP (!) 150/90   Pulse 68   Resp 20   Ht '5\' 11"'$  (1.803 m)   Wt 285 lb (129.3 kg)  SpO2 95% Comment: RA  BMI 39.75 kg/m   Gen: NAD Neuro: Alert and oriented Resp: Nonlaboured Abd: Soft, ntnd Extr: WWP  Diagnostic Studies & Laboratory data:     Recent Radiology Findings:   CT CORONARY MORPH W/CTA COR W/SCORE W/CA W/CM &/OR WO/CM  Addendum Date: 12/05/2021   ADDENDUM REPORT: 12/05/2021 13:51 CLINICAL DATA:  Severe Aortic Stenosis. EXAM: Cardiac TAVR CT TECHNIQUE: A non-contrast, gated CT scan was obtained with axial slices of 3 mm through the heart for aortic valve calcium scoring. A 120 kV retrospective, gated, contrast cardiac scan was obtained. Gantry rotation speed was 250 msecs and collimation was 0.6 mm. Nitroglycerin was not given. The 3D data set was reconstructed in 5% intervals of the 0-95% of the R-R cycle. Systolic and diastolic phases were analyzed on a dedicated workstation using MPR, MIP, and VRT modes. The patient received 100 cc of contrast. FINDINGS: Image quality: Average. Contrast bolus noted to be in the subclavian vein. Poor opacification but interpretable study. Noise artifact is: Limited. Valve Morphology: Tricuspid aortic valve with severe calcifications. Restricted leaflet motion in systole. Aortic Valve Calcium score: 2498 Aortic annular dimension: Phase assessed: 20% Annular area: 541 mm2 Annular perimeter: 83.4 mm Max diameter: 27.9 mm Min diameter: 24.4 mm Annular and subannular calcification: Mild annular calcium under the [place]. Membranous septum length: 7.36  Optimal coplanar projection: LAO 20 CRA 1 Coronary Artery Height above Annulus: Left Main: 10.3 mm Right Coronary: 17.0 mm Sinus of Valsalva Measurements: Non-coronary: 35 mm Right-coronary: 35 mm Left-coronary: 36 mm Sinus of Valsalva Height: Non-coronary: 26.5 mm Right-coronary: 22.8 mm Left-coronary: 20 mm Sinotubular Junction: 28 mm Ascending Thoracic Aorta: 34 mm Coronary Arteries: Normal coronary origin. Right dominance. The study was performed without use of NTG and is insufficient for plaque evaluation. Please refer to recent cardiac catheterization for coronary assessment. Cardiac Morphology: Right Atrium: Right atrial size is within normal limits. Right Ventricle: The right ventricular cavity is dilated. Left Atrium: Left atrial size is normal in size with no left atrial appendage filling defect. Left Ventricle: The ventricular cavity size is within normal limits. Pulmonary arteries: Dilated pulmonary artery suggestive of pulmonary hypertension. Pulmonary veins: Normal pulmonary venous drainage. Pericardium: Normal thickness with no significant effusion or calcium present. Mitral Valve: The mitral valve is normal structure without significant calcification. Extra-cardiac findings: See attached radiology report for non-cardiac structures. IMPRESSION: 1. Tricuspid aortic valve with severe aortic stenosis. 2. Annular measurements are in between a 26/29 mm S3 (541 mm2). Perimeter supports a 34 mm Evolut Pro. 3. No significant annular or subannular calcifications. 4. Borderline left main height (10.3 mm). 5. Optimal Fluoroscopic Angle for Delivery: LAO 20 CRA 1 6. Dilated pulmonary artery suggestive of pulmonary hypertension. [place] T. Audie Box, MD Electronically Signed   By: Eleonore Chiquito M.D.   On: 12/05/2021 13:51   Result Date: 12/05/2021 EXAM: OVER-READ INTERPRETATION  CT CHEST The following report is a limited chest CT over-read performed by radiologist Dr. Yetta Glassman of [facility], [place] on  12/04/2021. This over-read does not include interpretation of cardiac or coronary anatomy or pathology. The cardiac CTA interpretation by the cardiologist is attached. COMPARISON:  None Available. FINDINGS: Extracardiac findings will be described separately under dictation for contemporaneously obtained CTA chest, abdomen and pelvis. IMPRESSION: Please see separate dictation for contemporaneously obtained CTA chest, abdomen and pelvis dated 12/04/2021 for full description of relevant extracardiac findings. Electronically Signed: By: Yetta Glassman M.D. On: 12/04/2021 17:35   CT ANGIO ABDOMEN PELVIS  W &/  OR WO CONTRAST  Result Date: 12/04/2021 CLINICAL DATA:  Preop evaluation for aortic valve replacement EXAM: CT ANGIOGRAPHY CHEST, ABDOMEN AND PELVIS TECHNIQUE: Non-contrast CT of the chest was initially obtained. Multidetector CT imaging through the chest, abdomen and pelvis was performed using the standard protocol during bolus administration of intravenous contrast. Multiplanar reconstructed images and MIPs were obtained and reviewed to evaluate the vascular anatomy. RADIATION DOSE REDUCTION: This exam was performed according to the departmental dose-optimization program which includes automated exposure control, adjustment of the mA and/or kV according to patient size and/or use of iterative reconstruction technique. CONTRAST:  117m OMNIPAQUE IOHEXOL 350 MG/ML SOLN COMPARISON:  CT abdomen and pelvis dated September 15, 2015 FINDINGS: CTA CHEST FINDINGS Cardiovascular: Normal heart size. No pericardial effusion. Normal caliber thoracic aorta with mild atherosclerotic disease. Aortic valve thickening and calcifications. Left main and three-vessel coronary artery calcifications. Standard three-vessel aortic arch with no significant stenosis. Mediastinum/Nodes: Esophagus and thyroid are unremarkable. No pathologically enlarged lymph nodes seen in the chest. Lungs/Pleura: Central airways are patent. No  consolidation, pleural effusion or pneumothorax. Small solid pulmonary nodule of the right lower lobe measuring 4 mm on series 5, image 57. Musculoskeletal: No chest wall abnormality. Prior right total shoulder arthroplasty. No acute or significant osseous findings. CTA ABDOMEN AND PELVIS FINDINGS Hepatobiliary: Numerous low-attenuation liver lesions which are similar to prior exam and likely simple cysts. No suspicious liver lesion. Gallstones with no evidence of gallbladder wall thickening. No biliary ductal dilation. Pancreas: High attenuation nodule along the anterior tail of the pancreas measuring 6 mm on series 4, image 132. No pancreatic ductal dilatation or surrounding inflammatory changes. Spleen: Normal in size without focal abnormality. Adrenals/Urinary Tract: Bilateral adrenal glands are unremarkable. No hydronephrosis nephrolithiasis. Mild bilateral hydroureter and tortuosity, unchanged when compared to prior. Bilateral low-attenuation renal lesions, largest are compatible with simple cysts, others are too small to completely characterize, no specific follow-up imaging is recommended for these lesions. Bladder is unremarkable. Stomach/Bowel: Stomach is within normal limits. Appendix appears normal. Diverticulosis. No evidence of bowel wall thickening, distention, or inflammatory changes. Vascular/lymphatic: Normal caliber abdominal aorta with moderate atherosclerotic disease. No pathologically enlarged lymph nodes seen in the abdomen or pelvis. Reproductive: Prostatomegaly with median lobe hypertrophy, measuring up to 6.0 cm. Other: No abdominal wall hernia or abnormality. No abdominopelvic ascites. Musculoskeletal: No acute or significant osseous findings. VASCULAR MEASUREMENTS PERTINENT TO TAVR: AORTA: Minimal Aortic Diameter-16.8 mm Severity of Aortic Calcification-moderate RIGHT PELVIS: Right Common Iliac Artery - Minimal Diameter-10.5 mm Tortuosity-none Calcification-moderate Right External Iliac  Artery - Minimal Diameter-9.1 mm Tortuosity-mild Calcification-mild Right Common Femoral Artery - Minimal Diameter-8.2 mm Tortuosity-none Calcification-mild LEFT PELVIS: Left Common Iliac Artery - Minimal Diameter-11.7 mm Tortuosity-none Calcification-moderate Left External Iliac Artery - Minimal Diameter-8.3 mm Tortuosity-mild Calcification-moderate Left Common Femoral Artery - Minimal Diameter-7.5 mm Tortuosity-none Calcification-mild Review of the MIP images confirms the above findings. IMPRESSION: 1. Vascular findings and measurements pertinent to potential TAVR procedure, as detailed above. 2. Thickening and calcification of the aortic valve, compatible with reported clinical history of aortic stenosis. 3. Moderate aortoiliac atherosclerosis. Left main and 3 vessel coronary artery disease. 4. Tiny enhancing nodule along the anterior aspect of the pancreatic tail, incompletely characterized on this examination primary differential considerations include high attenuation focus of pancreatic parenchyma, intra pancreatic splenule or pancreatic neuroendocrine tumor. Consider more definitive characterization by MRI with and without contrast and assessment of biochemical profile for functionality. 5. Prostatomegaly with median lobe hypertrophy. 6. Small solid pulmonary nodule of the right  lower lobe measuring 4 mm. No follow-up needed if patient is low-risk.This recommendation follows the consensus statement: Guidelines for Management of Incidental Pulmonary Nodules Detected on CT Images: From the Fleischner Society 2017; Radiology 2017; 284:228-243. Electronically Signed   By: Yetta Glassman M.D.   On: 12/04/2021 17:34   CT ANGIO CHEST AORTA W/CM & OR WO/CM  Result Date: 12/04/2021 CLINICAL DATA:  Preop evaluation for aortic valve replacement EXAM: CT ANGIOGRAPHY CHEST, ABDOMEN AND PELVIS TECHNIQUE: Non-contrast CT of the chest was initially obtained. Multidetector CT imaging through the chest, abdomen and  pelvis was performed using the standard protocol during bolus administration of intravenous contrast. Multiplanar reconstructed images and MIPs were obtained and reviewed to evaluate the vascular anatomy. RADIATION DOSE REDUCTION: This exam was performed according to the departmental dose-optimization program which includes automated exposure control, adjustment of the mA and/or kV according to patient size and/or use of iterative reconstruction technique. CONTRAST:  1103m OMNIPAQUE IOHEXOL 350 MG/ML SOLN COMPARISON:  CT abdomen and pelvis dated September 15, 2015 FINDINGS: CTA CHEST FINDINGS Cardiovascular: Normal heart size. No pericardial effusion. Normal caliber thoracic aorta with mild atherosclerotic disease. Aortic valve thickening and calcifications. Left main and three-vessel coronary artery calcifications. Standard three-vessel aortic arch with no significant stenosis. Mediastinum/Nodes: Esophagus and thyroid are unremarkable. No pathologically enlarged lymph nodes seen in the chest. Lungs/Pleura: Central airways are patent. No consolidation, pleural effusion or pneumothorax. Small solid pulmonary nodule of the right lower lobe measuring 4 mm on series 5, image 57. Musculoskeletal: No chest wall abnormality. Prior right total shoulder arthroplasty. No acute or significant osseous findings. CTA ABDOMEN AND PELVIS FINDINGS Hepatobiliary: Numerous low-attenuation liver lesions which are similar to prior exam and likely simple cysts. No suspicious liver lesion. Gallstones with no evidence of gallbladder wall thickening. No biliary ductal dilation. Pancreas: High attenuation nodule along the anterior tail of the pancreas measuring 6 mm on series 4, image 132. No pancreatic ductal dilatation or surrounding inflammatory changes. Spleen: Normal in size without focal abnormality. Adrenals/Urinary Tract: Bilateral adrenal glands are unremarkable. No hydronephrosis nephrolithiasis. Mild bilateral hydroureter and  tortuosity, unchanged when compared to prior. Bilateral low-attenuation renal lesions, largest are compatible with simple cysts, others are too small to completely characterize, no specific follow-up imaging is recommended for these lesions. Bladder is unremarkable. Stomach/Bowel: Stomach is within normal limits. Appendix appears normal. Diverticulosis. No evidence of bowel wall thickening, distention, or inflammatory changes. Vascular/lymphatic: Normal caliber abdominal aorta with moderate atherosclerotic disease. No pathologically enlarged lymph nodes seen in the abdomen or pelvis. Reproductive: Prostatomegaly with median lobe hypertrophy, measuring up to 6.0 cm. Other: No abdominal wall hernia or abnormality. No abdominopelvic ascites. Musculoskeletal: No acute or significant osseous findings. VASCULAR MEASUREMENTS PERTINENT TO TAVR: AORTA: Minimal Aortic Diameter-16.8 mm Severity of Aortic Calcification-moderate RIGHT PELVIS: Right Common Iliac Artery - Minimal Diameter-10.5 mm Tortuosity-none Calcification-moderate Right External Iliac Artery - Minimal Diameter-9.1 mm Tortuosity-mild Calcification-mild Right Common Femoral Artery - Minimal Diameter-8.2 mm Tortuosity-none Calcification-mild LEFT PELVIS: Left Common Iliac Artery - Minimal Diameter-11.7 mm Tortuosity-none Calcification-moderate Left External Iliac Artery - Minimal Diameter-8.3 mm Tortuosity-mild Calcification-moderate Left Common Femoral Artery - Minimal Diameter-7.5 mm Tortuosity-none Calcification-mild Review of the MIP images confirms the above findings. IMPRESSION: 1. Vascular findings and measurements pertinent to potential TAVR procedure, as detailed above. 2. Thickening and calcification of the aortic valve, compatible with reported clinical history of aortic stenosis. 3. Moderate aortoiliac atherosclerosis. Left main and 3 vessel coronary artery disease. 4. Tiny enhancing nodule along the anterior aspect  of the pancreatic tail,  incompletely characterized on this examination primary differential considerations include high attenuation focus of pancreatic parenchyma, intra pancreatic splenule or pancreatic neuroendocrine tumor. Consider more definitive characterization by MRI with and without contrast and assessment of biochemical profile for functionality. 5. Prostatomegaly with median lobe hypertrophy. 6. Small solid pulmonary nodule of the right lower lobe measuring 4 mm. No follow-up needed if patient is low-risk.This recommendation follows the consensus statement: Guidelines for Management of Incidental Pulmonary Nodules Detected on CT Images: From the Fleischner Society 2017; Radiology 2017; 284:228-243. Electronically Signed   By: Yetta Glassman M.D.   On: 12/04/2021 17:34       I have independently reviewed the above radiology studies  and reviewed the findings with the patient.   Recent Lab Findings: Lab Results  Component Value Date   WBC 13.0 (H) 11/07/2021   HGB 13.9 11/13/2021   HGB 13.6 11/13/2021   HCT 41.0 11/13/2021   HCT 40.0 11/13/2021   PLT 242 11/07/2021   GLUCOSE 132 (H) 12/04/2021   CHOL 157 07/11/2016   TRIG 74 07/11/2016   HDL 45 07/11/2016   LDLCALC 97 07/11/2016   NA 139 12/04/2021   K 3.9 12/04/2021   CL 104 12/04/2021   CREATININE 1.90 (H) 12/04/2021   BUN 30 (H) 12/04/2021   CO2 25 12/04/2021   INR 0.97 07/11/2016     Assessment / Plan:   66 y.o. Caucasian male with hypertension, hyperlipidemia, h/o NICM, PAF now with severe symptomatic aortic stenosis.   Meets criteria for AV replacement.   Discussed TAVR vs SAVR.  For surgical options, he does have a mid-circ lesion and proximal right that could be treated with veins in addition to an appendage clip and MAZE.   He is going to have an easier recovery with TAVR. Especially given the renal dysfunction, obesity, and prior cardiomyopathy.   He chose TAVR. This was after a full discussion of risks/benefits/alternatives and  expected hospital course. All questions asked and answered. He understands this valve should last for years, but there is a chance he may need open surgery later in life.  Valve: 26 Edwards S3.  (Annulus 510m2. LM height of 110m Bailout: Yes Access: Transfemoral NYHA: II Misc procedural: On Eliquis     I  spent 30 minutes with  the patient face to face in counseling and coordination of care.    DaPierre Balinter 12/16/2021 9:45 PM

## 2022-01-02 ENCOUNTER — Other Ambulatory Visit: Payer: Self-pay

## 2022-01-02 DIAGNOSIS — I350 Nonrheumatic aortic (valve) stenosis: Secondary | ICD-10-CM

## 2022-01-10 DIAGNOSIS — E1159 Type 2 diabetes mellitus with other circulatory complications: Secondary | ICD-10-CM | POA: Diagnosis not present

## 2022-01-10 DIAGNOSIS — G720 Drug-induced myopathy: Secondary | ICD-10-CM | POA: Diagnosis not present

## 2022-01-10 DIAGNOSIS — R7309 Other abnormal glucose: Secondary | ICD-10-CM | POA: Diagnosis not present

## 2022-01-10 DIAGNOSIS — I10 Essential (primary) hypertension: Secondary | ICD-10-CM | POA: Diagnosis not present

## 2022-01-10 DIAGNOSIS — R972 Elevated prostate specific antigen [PSA]: Secondary | ICD-10-CM | POA: Diagnosis not present

## 2022-01-10 DIAGNOSIS — M109 Gout, unspecified: Secondary | ICD-10-CM | POA: Diagnosis not present

## 2022-01-10 DIAGNOSIS — G4733 Obstructive sleep apnea (adult) (pediatric): Secondary | ICD-10-CM | POA: Diagnosis not present

## 2022-01-10 DIAGNOSIS — I4891 Unspecified atrial fibrillation: Secondary | ICD-10-CM | POA: Diagnosis not present

## 2022-01-10 DIAGNOSIS — I509 Heart failure, unspecified: Secondary | ICD-10-CM | POA: Diagnosis not present

## 2022-01-10 DIAGNOSIS — Z7984 Long term (current) use of oral hypoglycemic drugs: Secondary | ICD-10-CM | POA: Diagnosis not present

## 2022-01-11 ENCOUNTER — Encounter (HOSPITAL_COMMUNITY)
Admission: RE | Admit: 2022-01-11 | Discharge: 2022-01-11 | Disposition: A | Discharge status: Home / Self Care | Payer: Medicare HMO | Source: Ambulatory Visit | Attending: Cardiovascular Disease | Admitting: Cardiovascular Disease

## 2022-01-11 ENCOUNTER — Ambulatory Visit (HOSPITAL_COMMUNITY)
Admission: RE | Admit: 2022-01-11 | Discharge: 2022-01-11 | Disposition: A | Discharge status: Home / Self Care | Payer: Medicare HMO | Source: Ambulatory Visit | Attending: Cardiovascular Disease | Admitting: Cardiovascular Disease

## 2022-01-11 ENCOUNTER — Other Ambulatory Visit: Payer: Self-pay

## 2022-01-11 DIAGNOSIS — Z1152 Encounter for screening for COVID-19: Secondary | ICD-10-CM | POA: Diagnosis not present

## 2022-01-11 DIAGNOSIS — I350 Nonrheumatic aortic (valve) stenosis: Secondary | ICD-10-CM | POA: Insufficient documentation

## 2022-01-11 DIAGNOSIS — Z01818 Encounter for other preprocedural examination: Secondary | ICD-10-CM | POA: Insufficient documentation

## 2022-01-11 LAB — COMPREHENSIVE METABOLIC PANEL
ALT: 19 U/L (ref 0–44)
AST: 17 U/L (ref 15–41)
Albumin: 3.8 g/dL (ref 3.5–5.0)
Alkaline Phosphatase: 45 U/L (ref 38–126)
Anion gap: 11 (ref 5–15)
BUN: 33 mg/dL — ABNORMAL HIGH (ref 8–23)
CO2: 26 mmol/L (ref 22–32)
Calcium: 9.6 mg/dL (ref 8.9–10.3)
Chloride: 101 mmol/L (ref 98–111)
Creatinine, Ser: 1.85 mg/dL — ABNORMAL HIGH (ref 0.61–1.24)
GFR, Estimated: 39 mL/min — ABNORMAL LOW (ref >60)
Glucose, Bld: 192 mg/dL — ABNORMAL HIGH (ref 70–99)
Potassium: 3.8 mmol/L (ref 3.5–5.1)
Sodium: 138 mmol/L (ref 135–145)
Total Bilirubin: 0.6 mg/dL (ref 0.3–1.2)
Total Protein: 6.9 g/dL (ref 6.5–8.1)

## 2022-01-11 LAB — CBC
HCT: 45.4 % (ref 39.0–52.0)
Hemoglobin: 15 g/dL (ref 13.0–17.0)
MCH: 29 pg (ref 26.0–34.0)
MCHC: 33 g/dL (ref 30.0–36.0)
MCV: 87.8 fL (ref 80.0–100.0)
Platelets: 187 10*3/uL (ref 150–400)
RBC: 5.17 MIL/uL (ref 4.22–5.81)
RDW: 14 % (ref 11.5–15.5)
WBC: 7.3 10*3/uL (ref 4.0–10.5)
nRBC: 0 % (ref 0.0–0.2)

## 2022-01-11 LAB — URINALYSIS, ROUTINE W REFLEX MICROSCOPIC
Bilirubin Urine: NEGATIVE
Glucose, UA: NEGATIVE mg/dL
Hgb urine dipstick: NEGATIVE
Ketones, ur: NEGATIVE mg/dL
Leukocytes,Ua: NEGATIVE
Nitrite: NEGATIVE
Protein, ur: NEGATIVE mg/dL
Specific Gravity, Urine: 1.006 (ref 1.005–1.030)
pH: 5 (ref 5.0–8.0)

## 2022-01-11 LAB — TYPE AND SCREEN
ABO/RH(D): A POS
Antibody Screen: NEGATIVE

## 2022-01-11 LAB — SURGICAL PCR SCREEN
MRSA, PCR: NEGATIVE
Staphylococcus aureus: NEGATIVE

## 2022-01-11 LAB — SARS CORONAVIRUS 2 BY RT PCR: SARS Coronavirus 2 by RT PCR: NEGATIVE

## 2022-01-11 LAB — PROTIME-INR
INR: 1 (ref 0.8–1.2)
Prothrombin Time: 12.9 seconds (ref 11.4–15.2)

## 2022-01-11 NOTE — Progress Notes (Addendum)
Pt signed consents for surgery. Lab work, EKG, CXR, Covid test obtained. CHG soap and shower instructions provided. Verbalized understanding.  Pharmacy called to complete Med Rec.   Addendum: Abnormal CMP noted to TAVR nurse

## 2022-01-14 MED ORDER — CEFAZOLIN IN SODIUM CHLORIDE 3-0.9 GM/100ML-% IV SOLN
3.0000 g | INTRAVENOUS | Status: AC
  Administered 2022-01-15: 3 g via INTRAVENOUS
  Filled 2022-01-14: qty 100

## 2022-01-14 MED ORDER — POTASSIUM CHLORIDE 2 MEQ/ML IV SOLN
80.0000 meq | INTRAVENOUS | Status: DC
  Filled 2022-01-14 (×2): qty 40

## 2022-01-14 MED ORDER — HEPARIN 30,000 UNITS/1000 ML (OHS) CELLSAVER SOLUTION
Status: DC
  Filled 2022-01-14: qty 1000

## 2022-01-14 MED ORDER — NOREPINEPHRINE 4 MG/250ML-% IV SOLN
0.0000 ug/min | INTRAVENOUS | Status: DC
  Filled 2022-01-14: qty 250

## 2022-01-14 MED ORDER — DEXMEDETOMIDINE HCL IN NACL 400 MCG/100ML IV SOLN
0.1000 ug/kg/h | INTRAVENOUS | Status: AC
  Administered 2022-01-15: 1 ug/kg/h via INTRAVENOUS
  Filled 2022-01-14: qty 100

## 2022-01-14 MED ORDER — MAGNESIUM SULFATE 50 % IJ SOLN
40.0000 meq | INTRAMUSCULAR | Status: DC
  Filled 2022-01-14: qty 9.85

## 2022-01-15 ENCOUNTER — Inpatient Hospital Stay (HOSPITAL_COMMUNITY): Payer: Medicare HMO | Admitting: Physician Assistant

## 2022-01-15 ENCOUNTER — Other Ambulatory Visit: Payer: Self-pay

## 2022-01-15 ENCOUNTER — Inpatient Hospital Stay (HOSPITAL_COMMUNITY)
Admission: RE | Admit: 2022-01-15 | Discharge: 2022-01-16 | DRG: 267 | Disposition: A | Discharge status: Home / Self Care | Payer: Medicare HMO | Attending: Cardiovascular Disease | Admitting: Cardiovascular Disease

## 2022-01-15 ENCOUNTER — Inpatient Hospital Stay (HOSPITAL_COMMUNITY): Payer: Medicare HMO | Admitting: Certified Registered"

## 2022-01-15 ENCOUNTER — Encounter (HOSPITAL_COMMUNITY): Payer: Self-pay | Admitting: Cardiovascular Disease

## 2022-01-15 ENCOUNTER — Ambulatory Visit (HOSPITAL_COMMUNITY): Payer: Medicare HMO | Attending: Cardiovascular Disease

## 2022-01-15 ENCOUNTER — Encounter (HOSPITAL_COMMUNITY)
Admission: RE | Disposition: A | Discharge status: Home / Self Care | Payer: Self-pay | Source: Home / Self Care | Attending: Cardiovascular Disease

## 2022-01-15 DIAGNOSIS — K219 Gastro-esophageal reflux disease without esophagitis: Secondary | ICD-10-CM | POA: Diagnosis present

## 2022-01-15 DIAGNOSIS — I350 Nonrheumatic aortic (valve) stenosis: Secondary | ICD-10-CM | POA: Insufficient documentation

## 2022-01-15 DIAGNOSIS — Z87891 Personal history of nicotine dependence: Secondary | ICD-10-CM | POA: Diagnosis not present

## 2022-01-15 DIAGNOSIS — I5022 Chronic systolic (congestive) heart failure: Secondary | ICD-10-CM | POA: Diagnosis not present

## 2022-01-15 DIAGNOSIS — G4733 Obstructive sleep apnea (adult) (pediatric): Secondary | ICD-10-CM | POA: Diagnosis present

## 2022-01-15 DIAGNOSIS — I2510 Atherosclerotic heart disease of native coronary artery without angina pectoris: Secondary | ICD-10-CM | POA: Diagnosis present

## 2022-01-15 DIAGNOSIS — Z953 Presence of xenogenic heart valve: Secondary | ICD-10-CM

## 2022-01-15 DIAGNOSIS — Z96611 Presence of right artificial shoulder joint: Secondary | ICD-10-CM | POA: Diagnosis present

## 2022-01-15 DIAGNOSIS — Z803 Family history of malignant neoplasm of breast: Secondary | ICD-10-CM | POA: Diagnosis not present

## 2022-01-15 DIAGNOSIS — N189 Chronic kidney disease, unspecified: Secondary | ICD-10-CM | POA: Diagnosis not present

## 2022-01-15 DIAGNOSIS — Z7901 Long term (current) use of anticoagulants: Secondary | ICD-10-CM | POA: Diagnosis not present

## 2022-01-15 DIAGNOSIS — Z801 Family history of malignant neoplasm of trachea, bronchus and lung: Secondary | ICD-10-CM

## 2022-01-15 DIAGNOSIS — N1832 Chronic kidney disease, stage 3b: Secondary | ICD-10-CM | POA: Diagnosis not present

## 2022-01-15 DIAGNOSIS — G894 Chronic pain syndrome: Secondary | ICD-10-CM | POA: Diagnosis present

## 2022-01-15 DIAGNOSIS — I517 Cardiomegaly: Secondary | ICD-10-CM | POA: Diagnosis not present

## 2022-01-15 DIAGNOSIS — Z6841 Body Mass Index (BMI) 40.0 and over, adult: Secondary | ICD-10-CM

## 2022-01-15 DIAGNOSIS — I4811 Longstanding persistent atrial fibrillation: Secondary | ICD-10-CM | POA: Diagnosis not present

## 2022-01-15 DIAGNOSIS — I110 Hypertensive heart disease with heart failure: Secondary | ICD-10-CM | POA: Diagnosis not present

## 2022-01-15 DIAGNOSIS — Z952 Presence of prosthetic heart valve: Secondary | ICD-10-CM | POA: Diagnosis not present

## 2022-01-15 DIAGNOSIS — I130 Hypertensive heart and chronic kidney disease with heart failure and stage 1 through stage 4 chronic kidney disease, or unspecified chronic kidney disease: Secondary | ICD-10-CM | POA: Diagnosis not present

## 2022-01-15 DIAGNOSIS — I340 Nonrheumatic mitral (valve) insufficiency: Secondary | ICD-10-CM | POA: Diagnosis not present

## 2022-01-15 DIAGNOSIS — I428 Other cardiomyopathies: Secondary | ICD-10-CM | POA: Diagnosis present

## 2022-01-15 DIAGNOSIS — I10 Essential (primary) hypertension: Secondary | ICD-10-CM | POA: Diagnosis present

## 2022-01-15 DIAGNOSIS — Z79899 Other long term (current) drug therapy: Secondary | ICD-10-CM

## 2022-01-15 DIAGNOSIS — Z86718 Personal history of other venous thrombosis and embolism: Secondary | ICD-10-CM | POA: Diagnosis not present

## 2022-01-15 DIAGNOSIS — Z86010 Personal history of colonic polyps: Secondary | ICD-10-CM | POA: Diagnosis not present

## 2022-01-15 DIAGNOSIS — M1990 Unspecified osteoarthritis, unspecified site: Secondary | ICD-10-CM | POA: Diagnosis not present

## 2022-01-15 DIAGNOSIS — Z886 Allergy status to analgesic agent status: Secondary | ICD-10-CM

## 2022-01-15 DIAGNOSIS — Z8249 Family history of ischemic heart disease and other diseases of the circulatory system: Secondary | ICD-10-CM | POA: Diagnosis not present

## 2022-01-15 DIAGNOSIS — I509 Heart failure, unspecified: Secondary | ICD-10-CM | POA: Diagnosis not present

## 2022-01-15 DIAGNOSIS — Z006 Encounter for examination for normal comparison and control in clinical research program: Secondary | ICD-10-CM

## 2022-01-15 DIAGNOSIS — E785 Hyperlipidemia, unspecified: Secondary | ICD-10-CM | POA: Diagnosis present

## 2022-01-15 DIAGNOSIS — Z833 Family history of diabetes mellitus: Secondary | ICD-10-CM | POA: Diagnosis not present

## 2022-01-15 DIAGNOSIS — Z96653 Presence of artificial knee joint, bilateral: Secondary | ICD-10-CM | POA: Diagnosis present

## 2022-01-15 DIAGNOSIS — N400 Enlarged prostate without lower urinary tract symptoms: Secondary | ICD-10-CM | POA: Diagnosis present

## 2022-01-15 DIAGNOSIS — I480 Paroxysmal atrial fibrillation: Secondary | ICD-10-CM | POA: Diagnosis present

## 2022-01-15 DIAGNOSIS — I4510 Unspecified right bundle-branch block: Secondary | ICD-10-CM | POA: Diagnosis not present

## 2022-01-15 DIAGNOSIS — N183 Chronic kidney disease, stage 3 unspecified: Secondary | ICD-10-CM | POA: Diagnosis present

## 2022-01-15 DIAGNOSIS — Z888 Allergy status to other drugs, medicaments and biological substances status: Secondary | ICD-10-CM

## 2022-01-15 HISTORY — PX: TRANSCATHETER AORTIC VALVE REPLACEMENT, TRANSFEMORAL: SHX6400

## 2022-01-15 HISTORY — PX: INTRAOPERATIVE TRANSTHORACIC ECHOCARDIOGRAM: SHX6523

## 2022-01-15 HISTORY — DX: Presence of prosthetic heart valve: Z95.2

## 2022-01-15 LAB — POCT I-STAT, CHEM 8
BUN: 23 mg/dL (ref 8–23)
BUN: 24 mg/dL — ABNORMAL HIGH (ref 8–23)
Calcium, Ion: 1.28 mmol/L (ref 1.15–1.40)
Calcium, Ion: 1.23 mmol/L (ref 1.15–1.40)
Chloride: 106 mmol/L (ref 98–111)
Chloride: 107 mmol/L (ref 98–111)
Creatinine, Ser: 1.5 mg/dL — ABNORMAL HIGH (ref 0.61–1.24)
Creatinine, Ser: 1.6 mg/dL — ABNORMAL HIGH (ref 0.61–1.24)
Glucose, Bld: 113 mg/dL — ABNORMAL HIGH (ref 70–99)
Glucose, Bld: 136 mg/dL — ABNORMAL HIGH (ref 70–99)
HCT: 43 % (ref 39.0–52.0)
HCT: 42 % (ref 39.0–52.0)
Hemoglobin: 14.6 g/dL (ref 13.0–17.0)
Hemoglobin: 14.3 g/dL (ref 13.0–17.0)
Potassium: 4.1 mmol/L (ref 3.5–5.1)
Potassium: 4.7 mmol/L (ref 3.5–5.1)
Sodium: 143 mmol/L (ref 135–145)
Sodium: 142 mmol/L (ref 135–145)
TCO2: 27 mmol/L (ref 22–32)
TCO2: 26 mmol/L (ref 22–32)

## 2022-01-15 LAB — GLUCOSE, CAPILLARY
Glucose-Capillary: 195 mg/dL — ABNORMAL HIGH (ref 70–99)
Glucose-Capillary: 133 mg/dL — ABNORMAL HIGH (ref 70–99)

## 2022-01-15 LAB — ECHOCARDIOGRAM LIMITED
AR max vel: 1.88 cm2
AV Area VTI: 1.36 cm2
AV Area mean vel: 1.69 cm2
AV Mean grad: 17 mmHg
AV Peak grad: 27.4 mmHg
Ao pk vel: 2.62 m/s

## 2022-01-15 LAB — POCT I-STAT 7, (LYTES, BLD GAS, ICA,H+H)
Acid-base deficit: 2 mmol/L (ref 0.0–2.0)
Bicarbonate: 25.1 mmol/L (ref 20.0–28.0)
Calcium, Ion: 1.31 mmol/L (ref 1.15–1.40)
HCT: 43 % (ref 39.0–52.0)
Hemoglobin: 14.6 g/dL (ref 13.0–17.0)
O2 Saturation: 100 %
Potassium: 4 mmol/L (ref 3.5–5.1)
Sodium: 143 mmol/L (ref 135–145)
TCO2: 27 mmol/L (ref 22–32)
pCO2 arterial: 48.8 mmHg — ABNORMAL HIGH (ref 32–48)
pH, Arterial: 7.319 — ABNORMAL LOW (ref 7.35–7.45)
pO2, Arterial: 188 mmHg — ABNORMAL HIGH (ref 83–108)

## 2022-01-15 LAB — ABO/RH: ABO/RH(D): A POS

## 2022-01-15 SURGERY — IMPLANTATION, AORTIC VALVE, TRANSCATHETER, FEMORAL APPROACH
Anesthesia: Monitor Anesthesia Care

## 2022-01-15 MED ORDER — HEPARIN SODIUM (PORCINE) 1000 UNIT/ML IJ SOLN
INTRAMUSCULAR | Status: AC
  Filled 2022-01-15: qty 1

## 2022-01-15 MED ORDER — ONDANSETRON HCL 4 MG/2ML IJ SOLN: 4.0000 mg | Freq: Four times a day (QID) | INTRAMUSCULAR | Status: DC | PRN

## 2022-01-15 MED ORDER — SODIUM CHLORIDE 0.9 % IV SOLN: INTRAVENOUS | Status: AC

## 2022-01-15 MED ORDER — PROPOFOL 10 MG/ML IV BOLUS
INTRAVENOUS | Status: DC | PRN
  Administered 2022-01-15 (×4): 30 mg via INTRAVENOUS

## 2022-01-15 MED ORDER — IOHEXOL 350 MG/ML SOLN
INTRAVENOUS | Status: DC | PRN
  Administered 2022-01-15: 45 mL

## 2022-01-15 MED ORDER — HEPARIN (PORCINE) IN NACL 1000-0.9 UT/500ML-% IV SOLN
INTRAVENOUS | Status: AC
  Filled 2022-01-15: qty 500

## 2022-01-15 MED ORDER — INSULIN ASPART 100 UNIT/ML IJ SOLN
INTRAMUSCULAR | Status: AC
  Filled 2022-01-15: qty 1

## 2022-01-15 MED ORDER — OXYCODONE HCL 5 MG PO TABS: 5.0000 mg | ORAL_TABLET | ORAL | Status: DC | PRN

## 2022-01-15 MED ORDER — HYDRALAZINE HCL 25 MG PO TABS
25.0000 mg | ORAL_TABLET | Freq: Two times a day (BID) | ORAL | Status: DC
  Administered 2022-01-15 – 2022-01-16 (×2): 25 mg via ORAL
  Filled 2022-01-15 (×2): qty 1

## 2022-01-15 MED ORDER — CARVEDILOL 12.5 MG PO TABS
12.5000 mg | ORAL_TABLET | Freq: Once | ORAL | Status: AC
  Administered 2022-01-15: 12.5 mg via ORAL
  Filled 2022-01-15: qty 1

## 2022-01-15 MED ORDER — CHLORHEXIDINE GLUCONATE 4 % EX LIQD: 30.0000 mL | CUTANEOUS | Status: DC

## 2022-01-15 MED ORDER — SODIUM CHLORIDE 0.9% FLUSH: 3.0000 mL | INTRAVENOUS | Status: DC | PRN

## 2022-01-15 MED ORDER — PROTAMINE SULFATE 10 MG/ML IV SOLN
INTRAVENOUS | Status: AC
  Filled 2022-01-15: qty 25

## 2022-01-15 MED ORDER — ACETAMINOPHEN 325 MG PO TABS: 650.0000 mg | ORAL_TABLET | Freq: Four times a day (QID) | ORAL | Status: DC | PRN

## 2022-01-15 MED ORDER — LIDOCAINE HCL 1 % IJ SOLN
INTRAMUSCULAR | Status: AC
  Filled 2022-01-15: qty 20

## 2022-01-15 MED ORDER — PROPOFOL 500 MG/50ML IV EMUL
INTRAVENOUS | Status: DC | PRN
  Administered 2022-01-15: 50 ug/kg/min via INTRAVENOUS

## 2022-01-15 MED ORDER — CARVEDILOL 12.5 MG PO TABS
12.5000 mg | ORAL_TABLET | Freq: Two times a day (BID) | ORAL | Status: DC
  Administered 2022-01-15 – 2022-01-16 (×2): 12.5 mg via ORAL
  Filled 2022-01-15 (×2): qty 1

## 2022-01-15 MED ORDER — LACTATED RINGERS IV SOLN: INTRAVENOUS | Status: DC

## 2022-01-15 MED ORDER — HEPARIN (PORCINE) IN NACL 1000-0.9 UT/500ML-% IV SOLN
INTRAVENOUS | Status: DC | PRN
  Administered 2022-01-15: 500 mL

## 2022-01-15 MED ORDER — TRAMADOL HCL 50 MG PO TABS: 50.0000 mg | ORAL_TABLET | ORAL | Status: DC | PRN

## 2022-01-15 MED ORDER — PROTAMINE SULFATE 10 MG/ML IV SOLN
INTRAVENOUS | Status: DC | PRN
  Administered 2022-01-15: 240 mg via INTRAVENOUS

## 2022-01-15 MED ORDER — CEFAZOLIN SODIUM-DEXTROSE 2-4 GM/100ML-% IV SOLN
2.0000 g | Freq: Three times a day (TID) | INTRAVENOUS | Status: AC
  Administered 2022-01-15 – 2022-01-16 (×2): 2 g via INTRAVENOUS
  Filled 2022-01-15 (×2): qty 100

## 2022-01-15 MED ORDER — FUROSEMIDE 40 MG PO TABS
80.0000 mg | ORAL_TABLET | Freq: Two times a day (BID) | ORAL | Status: DC
  Administered 2022-01-16: 80 mg via ORAL
  Filled 2022-01-15: qty 2

## 2022-01-15 MED ORDER — TAMSULOSIN HCL 0.4 MG PO CAPS
0.4000 mg | ORAL_CAPSULE | Freq: Every morning | ORAL | Status: DC
  Administered 2022-01-16: 0.4 mg via ORAL
  Filled 2022-01-15: qty 1

## 2022-01-15 MED ORDER — POTASSIUM CHLORIDE CRYS ER 20 MEQ PO TBCR
20.0000 meq | EXTENDED_RELEASE_TABLET | Freq: Two times a day (BID) | ORAL | Status: DC
  Administered 2022-01-15 – 2022-01-16 (×2): 20 meq via ORAL
  Filled 2022-01-15 (×2): qty 1

## 2022-01-15 MED ORDER — SODIUM CHLORIDE 0.9 % IV SOLN: INTRAVENOUS | Status: DC

## 2022-01-15 MED ORDER — NITROGLYCERIN IN D5W 200-5 MCG/ML-% IV SOLN
0.0000 ug/min | INTRAVENOUS | Status: DC
  Filled 2022-01-15: qty 250

## 2022-01-15 MED ORDER — SODIUM CHLORIDE 0.9% FLUSH
3.0000 mL | Freq: Two times a day (BID) | INTRAVENOUS | Status: DC
  Administered 2022-01-16: 3 mL via INTRAVENOUS

## 2022-01-15 MED ORDER — LIDOCAINE HCL (PF) 1 % IJ SOLN
INTRAMUSCULAR | Status: DC | PRN
  Administered 2022-01-15 (×2): 10 mL

## 2022-01-15 MED ORDER — SODIUM CHLORIDE 0.9 % IV SOLN: 250.0000 mL | INTRAVENOUS | Status: DC | PRN

## 2022-01-15 MED ORDER — CHLORHEXIDINE GLUCONATE 0.12 % MT SOLN
15.0000 mL | Freq: Once | OROMUCOSAL | Status: AC
  Administered 2022-01-15: 15 mL via OROMUCOSAL
  Filled 2022-01-15: qty 15

## 2022-01-15 MED ORDER — CHLORHEXIDINE GLUCONATE 4 % EX LIQD: 60.0000 mL | Freq: Once | CUTANEOUS | Status: DC

## 2022-01-15 MED ORDER — APIXABAN 5 MG PO TABS
5.0000 mg | ORAL_TABLET | Freq: Two times a day (BID) | ORAL | Status: DC
  Administered 2022-01-16: 5 mg via ORAL
  Filled 2022-01-15: qty 1

## 2022-01-15 MED ORDER — HEPARIN SODIUM (PORCINE) 1000 UNIT/ML IJ SOLN
INTRAMUSCULAR | Status: DC | PRN
  Administered 2022-01-15: 24000 [IU] via INTRAVENOUS

## 2022-01-15 MED ORDER — ISOSORBIDE MONONITRATE ER 30 MG PO TB24
30.0000 mg | ORAL_TABLET | Freq: Every day | ORAL | Status: DC
  Administered 2022-01-16: 30 mg via ORAL
  Filled 2022-01-15: qty 1

## 2022-01-15 MED ORDER — ACETAMINOPHEN 650 MG RE SUPP: 650.0000 mg | Freq: Four times a day (QID) | RECTAL | Status: DC | PRN

## 2022-01-15 MED ORDER — MORPHINE SULFATE (PF) 2 MG/ML IV SOLN: 1.0000 mg | INTRAVENOUS | Status: DC | PRN

## 2022-01-15 MED ORDER — INSULIN ASPART 100 UNIT/ML IJ SOLN
0.0000 [IU] | INTRAMUSCULAR | Status: DC | PRN
  Administered 2022-01-15: 4 [IU] via SUBCUTANEOUS

## 2022-01-15 SURGICAL SUPPLY — 32 items
BAG SNAP BAND KOVER 36X36 (MISCELLANEOUS) ×2 IMPLANT
CABLE ADAPT PACING TEMP 12FT (ADAPTER) IMPLANT
CATH 26 ULTRA DELIVERY (CATHETERS) IMPLANT
CATH DIAG 6FR PIGTAIL ANGLED (CATHETERS) IMPLANT
CATH INFINITI 6F AL2 (CATHETERS) IMPLANT
CATH S G BIP PACING (CATHETERS) IMPLANT
CLOSURE MYNX CONTROL 6F/7F (Vascular Products) IMPLANT
CLOSURE PERCLOSE PROSTYLE (VASCULAR PRODUCTS) IMPLANT
CRIMPER (MISCELLANEOUS) IMPLANT
DEVICE INFLATION ATRION QL2530 (MISCELLANEOUS) IMPLANT
ELECT DEFIB PAD ADLT CADENCE (PAD) IMPLANT
KIT HEART LEFT (KITS) ×1 IMPLANT
KIT MICROPUNCTURE NIT STIFF (SHEATH) IMPLANT
KIT SAPIAN 3 ULTRA RESILIA 26 (Valve) IMPLANT
MAT PREVALON FULL STRYKER (MISCELLANEOUS) IMPLANT
PACK CARDIAC CATHETERIZATION (CUSTOM PROCEDURE TRAY) ×1 IMPLANT
SHEATH BRITE TIP 7FR 35CM (SHEATH) IMPLANT
SHEATH INTRODUCER SET 20-26 (SHEATH) IMPLANT
SHEATH PINNACLE 6F 10CM (SHEATH) IMPLANT
SHEATH PINNACLE 8F 10CM (SHEATH) IMPLANT
SHEATH PROBE COVER 6X72 (BAG) IMPLANT
SLEEVE REPOSITIONING LENGTH 30 (MISCELLANEOUS) IMPLANT
STOPCOCK MORSE 400PSI 3WAY (MISCELLANEOUS) ×2 IMPLANT
SYR MEDRAD MARK V 150ML (SYRINGE) IMPLANT
TRANSDUCER W/STOPCOCK (MISCELLANEOUS) ×2 IMPLANT
TUBING CIL FLEX 10 FLL-RA (TUBING) IMPLANT
TUBING CONTRAST HIGH PRESS 48 (TUBING) IMPLANT
WIRE AMPLATZ SS-J .035X180CM (WIRE) IMPLANT
WIRE EMERALD 3MM-J .035X150CM (WIRE) IMPLANT
WIRE EMERALD 3MM-J .035X260CM (WIRE) IMPLANT
WIRE EMERALD ST .035X260CM (WIRE) ×1 IMPLANT
WIRE SAFARI SM CURVE 275 (WIRE) IMPLANT

## 2022-01-15 NOTE — Progress Notes (Signed)
Patient and report received from Owen.  Arterial line was discontinued, site looks good, soft and supple.  Bilateral groin sites looks great. No swelling or bleeding noted.  Vitals are being monitored.   Patient is currently sleeping with face mask due to sleep apnea but saturating great at 95%.  Waiting for bed on 4E.  Will continue to monitor.

## 2022-01-15 NOTE — Anesthesia Postprocedure Evaluation (Signed)
Anesthesia Post Note  Patient: [name]  Procedure(s) Performed: Transcatheter Aortic Valve Replacement, Transfemoral INTRAOPERATIVE TRANSTHORACIC ECHOCARDIOGRAM     Patient location during evaluation: Endoscopy Anesthesia Type: MAC Level of consciousness: awake Pain management: pain level controlled Vital Signs Assessment: post-procedure vital signs reviewed and stable Respiratory status: spontaneous breathing Cardiovascular status: stable Postop Assessment: no apparent nausea or vomiting Anesthetic complications: no   There were no known notable events for this encounter.  Last Vitals:  Vitals:   01/15/22 1645 01/15/22 1650  BP: (!) 145/60 (!) 136/56  Pulse: 64 67  Resp: 16 18  Temp:    SpO2: 96% 95%    Last Pain:  Vitals:   01/15/22 1626  TempSrc: Temporal  PainSc: Asleep                 Caycee Wanat

## 2022-01-15 NOTE — Discharge Instructions (Signed)

## 2022-01-15 NOTE — CV Procedure (Signed)
[facility]  TAVR OPERATIVE NOTE   Date of Procedure:  01/15/2022  Preoperative Diagnosis: Severe Aortic Stenosis   Postoperative Diagnosis: Same   Procedure:   Transcatheter Aortic Valve Replacement - Transfemoral Approach  Edwards Sapien 3 THV (size 26 mm, model # [id number]  serial # [id number])   Co-Surgeons:  Lauree Chandler, MD and Justice Rocher, MD   Anesthesiologist:  Nyoka Cowden  Echocardiographer:  Gasper Sells  Pre-operative Echo Findings: Severe aortic stenosis Normal left ventricular systolic function  Post-operative Echo Findings: No paravalvular leak Normal left ventricular systolic function  BRIEF CLINICAL NOTE AND INDICATIONS FOR SURGERY  66 yo male with history of CAD, chronic kidney disease, HTN, hyperlipidemia, GERD, prior DVT, non-ischemic cardiomyopathy with recovery of normal LV function, paroxysmal atrial fibrillation, obesity, sleep apnea and severe aortic stenosis who is here today for TAVR. Echo 09/18/21 with LVEF=60-65%. Moderate LVH. The aortic valve leaflets are thickened and calcified. Mean gradient 34.8 mmHg. Cardiac cath 11/13/21 with disease in the mid Circumflex and the RCA. No disease in the LAD. Mean gradient across the aortic valve of 39.7 mmHg. He has chronic kidney disease with baseline creatinine around 2.0. He has paroxysmal atrial fibrillation and is on Eliquis. Recent worsened fatigue.   During the course of the patient's preoperative work up they have been evaluated comprehensively by a multidisciplinary team of specialists coordinated through the [facility] in the [facility].  They have been demonstrated to suffer from symptomatic severe aortic stenosis as noted above. The patient has been counseled extensively as to the relative risks and benefits of all options for the treatment of severe aortic stenosis including long term medical therapy, conventional surgery for aortic  valve replacement, and transcatheter aortic valve replacement.  The patient has been independently evaluated by Dr. Tenny Craw with CT surgery and they are felt to be at high risk for conventional surgical aortic valve replacement. The surgeon indicated the patient would be a poor candidate for conventional surgery. Based upon review of all of the patient's preoperative diagnostic tests they are felt to be candidate for transcatheter aortic valve replacement using the transfemoral approach as an alternative to high risk conventional surgery.    Following the decision to proceed with transcatheter aortic valve replacement, a discussion has been held regarding what types of management strategies would be attempted intraoperatively in the event of life-threatening complications, including whether or not the patient would be considered a candidate for the use of cardiopulmonary bypass and/or conversion to open sternotomy for attempted surgical intervention.  The patient has been advised of a variety of complications that might develop peculiar to this approach including but not limited to risks of death, stroke, paravalvular leak, aortic dissection or other major vascular complications, aortic annulus rupture, device embolization, cardiac rupture or perforation, acute myocardial infarction, arrhythmia, heart block or bradycardia requiring permanent pacemaker placement, congestive heart failure, respiratory failure, renal failure, pneumonia, infection, other late complications related to structural valve deterioration or migration, or other complications that might ultimately cause a temporary or permanent loss of functional independence or other long term morbidity.  The patient provides full informed consent for the procedure as described and all questions were answered preoperatively.    DETAILS OF THE OPERATIVE PROCEDURE  PREPARATION:   The patient is brought to the operating room on the above mentioned date and  central monitoring was established by the anesthesia team including placement of a radial arterial line. The patient is placed in the  supine position on the operating table.  Intravenous antibiotics are administered. Conscious sedation is used.   Baseline transthoracic echocardiogram was performed. The patient's chest, abdomen, both groins, and both lower extremities are prepared and draped in a sterile manner. A time out procedure is performed.   PERIPHERAL ACCESS:   Using the modified Seldinger technique, femoral arterial and venous access were obtained with placement of a 6 Fr sheath in the artery and a 7 Fr sheath in the vein on the left side using u/s guidance.  A pigtail diagnostic catheter was passed through the femoral arterial sheath under fluoroscopic guidance into the aortic root.  A temporary transvenous pacemaker catheter was passed through the femoral venous sheath under fluoroscopic guidance into the right ventricle.  The pacemaker was tested to ensure stable lead placement and pacemaker capture. Aortic root angiography was performed in order to determine the optimal angiographic angle for valve deployment.  TRANSFEMORAL ACCESS:  A micropuncture kit was used to gain access to the right femoral artery using u/s guidance. Position confirmed with angiography. Pre-closure with double ProGlide closure devices. The patient was heparinized systemically and ACT verified > 250 seconds.    A 14 Fr transfemoral E-sheath was introduced into the right femoral artery after progressively dilating over an Amplatz superstiff wire. An AL-2 catheter was used to direct a straight-tip exchange length wire across the native aortic valve into the left ventricle. This was exchanged out for a pigtail catheter and position was confirmed in the LV apex. Simultaneous LV and Ao pressures were recorded.  The pigtail catheter was then exchanged for a Safari wire in the LV apex.   TRANSCATHETER HEART VALVE DEPLOYMENT:   An Edwards Sapien 3 THV (size 26 mm) was prepared and crimped per manufacturer's guidelines, and the proper orientation of the valve is confirmed on the [employer] delivery system. Additional cc of saline placed in inflation device. The valve was advanced through the introducer sheath using normal technique until in an appropriate position in the abdominal aorta beyond the sheath tip. The balloon was then retracted and using the fine-tuning wheel was centered on the valve. The valve was then advanced across the aortic arch using appropriate flexion of the catheter. The valve was carefully positioned across the aortic valve annulus. The Commander catheter was retracted using normal technique. Once final position of the valve has been confirmed by angiographic assessment, the valve is deployed while temporarily holding ventilation and during rapid ventricular pacing to maintain systolic blood pressure < 50 mmHg and pulse pressure < 10 mmHg. The balloon inflation is held for >3 seconds after reaching full deployment volume. Once the balloon has fully deflated the balloon is retracted into the ascending aorta and valve function is assessed using TTE. There is felt to be no paravalvular leak and no central aortic insufficiency.  The patient's hemodynamic recovery following valve deployment is good.  The deployment balloon and guidewire are both removed. Echo demostrated acceptable post-procedural gradients, stable mitral valve function, and no AI.   PROCEDURE COMPLETION:  The sheath was then removed and closure devices were completed. Protamine was administered once femoral arterial repair was complete. The temporary pacemaker, pigtail catheters and femoral sheaths were removed with a Mynx closure device placed in the artery and manual pressure used for venous hemostasis.    The patient tolerated the procedure well and is transported to the surgical intensive care in stable condition. There were no  immediate intraoperative complications. All sponge instrument and needle counts are verified  correct at completion of the operation.   No blood products were administered during the operation.  The patient received a total of 45 mL of intravenous contrast during the procedure.  LVEDP: 18 mm Hg  Lauree Chandler MD, [facility] 01/15/2022 4:24 PM

## 2022-01-15 NOTE — Anesthesia Procedure Notes (Signed)
Procedure Name: MAC Date/Time: 01/15/2022 2:50 PM  Performed by: Anastasio Auerbach, CRNAPre-anesthesia Checklist: Emergency Drugs available, Suction available and Patient being monitored Oxygen Delivery Method: Circle system utilized and Simple face mask Induction Type: IV induction Airway Equipment and Method: Oral airway

## 2022-01-15 NOTE — Anesthesia Procedure Notes (Signed)
Arterial Line Insertion Start/End12/01/2022 11:18 AM, 01/15/2022 11:18 AM Performed by: Lavell Luster, CRNA, CRNA  Preanesthetic checklist: patient identified, IV checked, site marked, risks and benefits discussed, surgical consent, monitors and equipment checked, pre-op evaluation and timeout performed Lidocaine 1% used for infiltration Left, radial was placed Catheter size: 20 G Hand hygiene performed , maximum sterile barriers used  and Seldinger technique used Allen's test indicative of satisfactory collateral circulation Attempts: 1 Procedure performed without using ultrasound guided technique. Ultrasound Notes:anatomy identified Following insertion, Biopatch and dressing applied. Patient tolerated the procedure well with no immediate complications.

## 2022-01-15 NOTE — Discharge Summary (Incomplete)
[facility]  Discharge Summary    Patient ID: Stephen Maldonado MRN: [id number]; DOB: 1955-07-17  Admit date: 01/15/2022 Discharge date: 01/16/2022  Primary Care Provider: Josetta Huddle, MD  Primary Cardiologist: Dr. Virgina Jock Dr. Angelena Form and Dr. Tenny Craw (TAVR)  Discharge Diagnoses    Principal Problem:   S/P TAVR (transcatheter aortic valve replacement) Active Problems:   Paroxysmal atrial fibrillation ([place])   Essential hypertension   Morbid obesity ([place])   Chronic systolic heart failure (H[facility])   Longstanding persistent atrial fibrillation (H[facility])   CKD (chronic kidney disease) stage 3, GFR 30-59 ml/min (HCC)   Chronic pain syndrome   Severe aortic stenosis   Allergies Allergies  Allergen Reactions   Atorvastatin Other (See Comments)    Muscle fatigue, soreness and dreams crazy Other reaction(s): severe arthralgias/myalgias Other reaction(s): severe arthralgias/myalgias   Aspirin Other (See Comments)    Avoid to kidney function Other reaction(s): **Reduced Kidney Function Avoid to kidney function  Other reaction(s): **Reduced Kidney Function   Simvastatin Other (See Comments)    Fatigue, soreness   Statins Other (See Comments)    Fatigue, soreness    Diagnostic Studies/Procedures    [facility]  TAVR OPERATIVE NOTE     Date of Procedure:                01/15/2022   Preoperative Diagnosis:      Severe Aortic Stenosis    Postoperative Diagnosis:    Same    Procedure:        Transcatheter Aortic Valve Replacement - Transfemoral Approach             Edwards Sapien 3 THV (size 26 mm, model # [id number]  serial # [id number])              Co-Surgeons:                        Lauree Chandler, MD and Justice Rocher, MD    Anesthesiologist:                  Nyoka Cowden   Echocardiographer:              Gasper Sells   Pre-operative Echo Findings: Severe aortic stenosis Normal left ventricular  systolic function   Post-operative Echo Findings: No paravalvular leak Normal left ventricular systolic function  _____________    Echo 01/16/22: completed but pending formal read at the time of discharge   History of Present Illness     Stephen Maldonado is a 66 y.o. male with a history of CAD, CKD IIIb, HTN, HLD, GERD, prior DVT, NICM with recovery of normal LV function, paroxysmal a-fib on Eliquis, obesity, sleep apnea, and moderate-severe AS who presented to [facility] on 01/15/22 for planned TAVR.  He has a history of aortic stenosis that has been followed closely by Dr. Virgina Jock. He recently reported feeling poorly with constant fatigue and dizziness. Echo 09/18/21 showed EF 65%, severe LFLG AS with a mean grad 17.3 mmHg, peak grad 32.2 mmHg, AVA 0.91cm2, DVI 0.24, SVI 25, and mild MR. Cardiac cath 11/13/21 showed severe disease in the mid circumflex and moderate disease in the RCA. No disease in the LAD and severe AS with a mean gradient across the aortic valve of 39.7 mmHg.   The patient was evaluated by the multidisciplinary valve team and felt to to be a suitable candidate for TAVR, which was set up for  01/15/22.   Hospital Course     Consultants: none  Severe AS: s/p successful TAVR with a 26 mm Edwards Sapien 3 Ultra Resilia THV via the TF approach on 01/15/22. Post operative echo completed but pending formal read. Groin sites are stable. ECG with sinus with old IRBBB and 1st deg AV block with no high grade heart block. Resumed on home Eliquis '5mg'$  BID. He walked with cardiac rehab with no issues. Plan to discharge home today with close follow up in the office next week.    CKD stage IIIb: creat baseline ~2. Creat 1.5 today.   HFimpEF: EF normalized. Continue Coreg, hydralazine/nitrates, and lasix.   PAF: resumed on home Eliquis   Morbid obesity: Body mass index is 40.86 kg/m.  Pancreatic lesion: pre TAVR CTs reported a " tiny enhancing nodule along the anterior aspect of the  pancreatic tail, incompletely characterized on this examination primary differential considerations include high attenuation focus of pancreatic parenchyma, intra pancreatic splenule or pancreatic neuroendocrine tumor. Consider more definitive characterization by MRI with and without contrast and assessment of biochemical profile for functionality."  Pulmonary nodules: pre TAVR CTs reported a "small solid pulmonary nodule of the right lower lobe measuring 4 mm. No follow-up needed if patient is low-risk."   _____________  Discharge Vitals Blood pressure (!) 134/96, pulse 70, temperature 97.9 F (36.6 C), temperature source Oral, resp. rate 18, height '5\' 11"'$  (1.803 m), weight 132.9 kg, SpO2 98 %.  Filed Weights   01/14/22 1000 01/15/22 0943 01/16/22 0510  Weight: 129.3 kg 133.8 kg 132.9 kg     GEN: Well nourished, well developed, in no acute distress, obese HEENT: normal Neck: no JVD or masses Cardiac: RRR; no murmurs, rubs, or gallops,no edema  Respiratory:  clear to auscultation bilaterally, normal work of breathing GI: soft, nontender, nondistended, + BS MS: no deformity or atrophy Skin: warm and dry, no rash.  Groin sites clear without hematoma or ecchymosis  Neuro:  Alert and Oriented x 3, Strength and sensation are intact Psych: euthymic mood, full affect   Labs & Radiologic Studies    CBC Recent Labs    01/15/22 1625 01/16/22 0116  WBC  --  8.7  HGB 14.3 14.2  HCT 42.0 41.7  MCV  --  86.3  PLT  --  093   Basic Metabolic Panel Recent Labs    01/15/22 1625 01/16/22 0116  NA 142 138  K 4.7 4.3  CL 107 107  CO2  --  24  GLUCOSE 136* 149*  BUN 24* 25*  CREATININE 1.60* 1.51*  CALCIUM  --  8.8*  MG  --  2.1   Liver Function Tests No results for input(s): "AST", "ALT", "ALKPHOS", "BILITOT", "PROT", "ALBUMIN" in the last 72 hours. No results for input(s): "LIPASE", "AMYLASE" in the last 72 hours. Cardiac Enzymes No results for input(s): "CKTOTAL", "CKMB",  "CKMBINDEX", "TROPONINI" in the last 72 hours. BNP Invalid input(s): "POCBNP" D-Dimer No results for input(s): "DDIMER" in the last 72 hours. Hemoglobin A1C No results for input(s): "HGBA1C" in the last 72 hours. Fasting Lipid Panel No results for input(s): "CHOL", "HDL", "LDLCALC", "TRIG", "CHOLHDL", "LDLDIRECT" in the last 72 hours. Thyroid Function Tests No results for input(s): "TSH", "T4TOTAL", "T3FREE", "THYROIDAB" in the last 72 hours.  Invalid input(s): "FREET3" _____________  ECHOCARDIOGRAM LIMITED  Result Date: 01/15/2022    ECHOCARDIOGRAM LIMITED REPORT   Patient Name:   [name] Date of Exam: 01/15/2022 Medical Rec #:  [id number]  Height:       71.0 in Accession #:    [id number]     Weight:       285.1 lb Date of Birth:  03-31-1955     BSA:          2.451 m Patient Age:    66 years       BP:           176/86 mmHg Patient Gender: M              HR:           63 bpm. Exam Location:  Inpatient Procedure: Limited Echo, Limited Color Doppler and Cardiac Doppler Indications:     Aortic stenosis I35.0  History:         Patient has prior history of Echocardiogram examinations, most                  recent 09/19/2021. Arrythmias:Atrial Fibrillation; Risk                  Factors:Hypertension and GERD.  Sonographer:     Bernadene Person RDCS Referring Phys:  Burnell Blanks Diagnosing Phys: Rudean Haskell MD  Sonographer Comments: 44m Edwards Sapien valve implanted. IMPRESSIONS  1. Interventional TTE for TAVR Placement.  2. Prir to procedure severe aortic stenosis. Mean gradient 26 mm Hg, Peak gradient 48 mm Hg, DVI 0.20. No AI.  3. Post procedure a 26 mm Edwards Sapien valve placed. No PVL. Mean gradient of 3 mm Hg, Peak gradient 6 mm Hg,Normal DVI. AVA 4 cm2.  4. Left ventricular ejection fraction, by estimation, is 60 to 65%. The left ventricle has normal function.  5. Right ventricular systolic function is normal. The right ventricular size is normal.  6. The mitral valve  is grossly normal. No evidence of mitral valve regurgitation. No evidence of mitral stenosis. Comparison(s): Successful TAVR Placement. FINDINGS  Left Ventricle: Left ventricular ejection fraction, by estimation, is 60 to 65%. The left ventricle has normal function. Right Ventricle: The right ventricular size is normal. No increase in right ventricular wall thickness. Right ventricular systolic function is normal. Mitral Valve: The mitral valve is grossly normal. No evidence of mitral valve stenosis. Tricuspid Valve: The tricuspid valve is normal in structure. Tricuspid valve regurgitation is trivial. Aortic Valve: Aortic valve mean gradient measures 17.0 mmHg. Aortic valve peak gradient measures 27.4 mmHg. Aortic valve area, by VTI measures 1.36 cm. Additional Comments: Spectral Doppler performed. Color Doppler performed.  LEFT VENTRICLE PLAX 2D LVOT diam:     2.60 cm LV SV:         82 LV SV Index:   33 LVOT Area:     5.31 cm  AORTIC VALVE AV Area (Vmax):    1.88 cm AV Area (Vmean):   1.69 cm AV Area (VTI):     1.36 cm AV Vmax:           261.67 cm/s AV Vmean:          176.867 cm/s AV VTI:            0.599 m AV Peak Grad:      27.4 mmHg AV Mean Grad:      17.0 mmHg LVOT Vmax:         92.70 cm/s LVOT Vmean:        56.300 cm/s LVOT VTI:          0.154 m LVOT/AV VTI ratio: 0.26  SHUNTS Systemic VTI:  0.15 m Systemic Diam: 2.60 cm Rudean Haskell MD Electronically signed by Rudean Haskell MD Signature Date/Time: 01/15/2022/5:16:09 PM    Final    Structural Heart Procedure  Result Date: 01/15/2022 See surgical note for result.  DG Chest 2 View  Result Date: 01/12/2022 CLINICAL DATA:  Preoperative evaluation for thoracic aortic valve replacement. EXAM: CHEST - 2 VIEW COMPARISON:  Chest two views 07/01/2018 FINDINGS: Cardiac silhouette and mediastinal contours are within normal limits. Mild calcification within aortic arch. The lungs are clear. No pleural effusion or pneumothorax. Mild multilevel  degenerative disc changes of the thoracic spine. Partial visualization of reverse total right shoulder arthroplasty. IMPRESSION: No active cardiopulmonary disease. Electronically Signed   By: Yvonne Kendall M.D.   On: 01/12/2022 12:41   Disposition   Pt is being discharged home today in good condition.  Follow-up Plans & Appointments     Follow-up Information     Eileen Stanford, PA-C. Go on 01/23/2022.   Specialties: Cardiology, Radiology Why: @ 2:30pm, please arrive at least 10 minutes early. Contact information: [address] [place] [address] [phone]                  Discharge Medications   Allergies as of 01/16/2022       Reactions   Atorvastatin Other (See Comments)   Muscle fatigue, soreness and dreams crazy Other reaction(s): severe arthralgias/myalgias Other reaction(s): severe arthralgias/myalgias   Aspirin Other (See Comments)   Avoid to kidney function Other reaction(s): **Reduced Kidney Function Avoid to kidney function Other reaction(s): **Reduced Kidney Function   Simvastatin Other (See Comments)   Fatigue, soreness   Statins Other (See Comments)   Fatigue, soreness        Medication List     TAKE these medications    Allergy Eye 0.025-0.3 % ophthalmic solution Generic drug: naphazoline-pheniramine Place 1 drop into both eyes 4 (four) times daily as needed for eye irritation.   allopurinol 100 MG tablet Commonly known as: ZYLOPRIM Take 200 mg by mouth every morning.   apixaban 5 MG Tabs tablet Commonly known as: ELIQUIS Take 1 tablet (5 mg total) by mouth 2 (two) times daily. Resume on 10/11 morning   ascorbic acid 500 MG tablet Commonly known as: VITAMIN C Take 500 mg by mouth daily.   carvedilol 12.5 MG tablet Commonly known as: COREG Take 1 tablet (12.5 mg total) by mouth 2 (two) times daily with a meal.   CoQ-10 200 MG Caps Take 200 mg by mouth daily.   cyanocobalamin 1000 MCG tablet Commonly  known as: VITAMIN B12 Take 1,000 mcg by mouth daily.   Fish Oil 1000 MG Caps Take 1,000 mg by mouth daily.   furosemide 80 MG tablet Commonly known as: LASIX Take 1 tablet (80 mg total) by mouth 2 (two) times daily.   Garlic 2505 MG Caps Take 1,000 mg by mouth daily.   hydrALAZINE 25 MG tablet Commonly known as: APRESOLINE Take 25 mg by mouth 2 (two) times daily with a meal.   isosorbide mononitrate 30 MG 24 hr tablet Commonly known as: IMDUR Take 1 tablet (30 mg total) by mouth daily.   Potassium Chloride ER 20 MEQ Tbcr Take 20 mEq by mouth 2 (two) times daily. with food   tamsulosin 0.4 MG Caps capsule Commonly known as: FLOMAX Take 0.4 mg by mouth every morning.   Turmeric 500 MG Tabs Take 500 mg by mouth daily.   Vitamin D 50 MCG (2000 UT)  tablet Take 2,000 Units by mouth daily.            Outstanding Labs/Studies   none  Duration of Discharge Encounter   Greater than 30 minutes including physician time.  Signed, Angelena Form, PA-C, Student Nurse Practitioner 01/16/2022, 9:25 AM  I have personally seen and examined this patient. I agree with the assessment and plan as outlined above.  Doing well this am. No chest pain. Groins stable. BP stable. Echo pending.  Tele with sinus D/c home today after echo.   Lauree Chandler, MD, [facility] 01/16/2022 10:04 AM

## 2022-01-15 NOTE — Interval H&P Note (Signed)
History and Physical Interval Note:  01/15/2022 10:04 AM  Stephen Maldonado  has presented today for surgery, with the diagnosis of Severe Aortic Stenosis.  The various methods of treatment have been discussed with the patient and family. After consideration of risks, benefits and other options for treatment, the patient has consented to  Procedure(s): Transcatheter Aortic Valve Replacement, Transfemoral (N/A) INTRAOPERATIVE TRANSTHORACIC ECHOCARDIOGRAM (N/A) as a surgical intervention.  The patient's history has been reviewed, patient examined, no change in status, stable for surgery.  I have reviewed the patient's chart and labs.  Questions were answered to the patient's satisfaction.     Pierre Bali Yonas Bunda

## 2022-01-15 NOTE — Transfer of Care (Signed)
Immediate Anesthesia Transfer of Care Note  Patient: Stephen Maldonado  Procedure(s) Performed: Transcatheter Aortic Valve Replacement, Transfemoral INTRAOPERATIVE TRANSTHORACIC ECHOCARDIOGRAM  Patient Location: Cath Lab  Anesthesia Type:MAC  Level of Consciousness: awake, oriented, and drowsy  Airway & Oxygen Therapy: Patient Spontanous Breathing and Patient connected to nasal cannula oxygen  Post-op Assessment: Report given to RN and Post -op Vital signs reviewed and stable  Post vital signs: Reviewed and stable  Last Vitals:  Vitals Value Taken Time  BP 129/57   Temp    Pulse 93   Resp 24   SpO2 96 %     Last Pain:  Vitals:   01/15/22 1006  TempSrc:   PainSc: 0-No pain         Complications: There were no known notable events for this encounter.

## 2022-01-15 NOTE — Anesthesia Preprocedure Evaluation (Signed)
Anesthesia Evaluation  Patient identified by MRN, date of birth, ID band Patient awake    Reviewed: Allergy & Precautions, NPO status , Patient's Chart, lab work & pertinent test results, reviewed documented beta blocker date and time   History of Anesthesia Complications Negative for: history of anesthetic complications  Airway Mallampati: II  TM Distance: >3 FB Neck ROM: Full    Dental  (+) Teeth Intact, Dental Advisory Given   Pulmonary neg pulmonary ROS   breath sounds clear to auscultation       Cardiovascular hypertension, Pt. on medications and Pt. on home beta blockers (-) angina +CHF  (-) Past MI + Valvular Problems/Murmurs AS  Rhythm:Regular + Systolic murmurs Left ventricle: The cavity size was mildly dilated. Wall    thickness was increased in a pattern of mild LVH. The estimated    ejection fraction was 50%. Diffuse hypokinesis. Doppler    parameters are consistent with abnormal left ventricular    relaxation (grade 1 diastolic dysfunction).  - Aortic valve: Trileaflet; moderately calcified leaflets. There    was mild stenosis. Mean gradient (S): 11 mm Hg. Valve area    (Vmax): 1.86 cm^2.  - Aorta: Ascending aortic diameter: 37 mm (S).  - Ascending aorta: The ascending aorta was borderline dilated.  - Mitral valve: There was trivial regurgitation.  - Left atrium: The atrium was mildly dilated.  - Right ventricle: The cavity size was mildly dilated. Systolic    function was normal.  - Tricuspid valve: Peak RV-RA gradient (S): 27 mm Hg.  - Pulmonary arteries: PA peak pressure: 35 mm Hg (S).  - Systemic veins: IVC measured 2.0 cm with < 50% respirophasic    variation, suggesting RA pressure 8 mmHg.   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  Will treat CAD medically at this time.  Unsure he will be a good candidate for SAVR + CABG with his obesity and renal dysfunction. Will refer for TAVR evaluation, with consideration for PCI post TAVR, if necessary.          Neuro/Psych neg Seizures  Neuromuscular disease  negative psych ROS   GI/Hepatic Neg liver ROS,GERD  ,,  Endo/Other    Renal/GU CRFRenal diseaseLab Results      Component                Value               Date                      CREATININE               1.85 (H)            01/11/2022  Musculoskeletal  (+) Arthritis ,    Abdominal   Peds  Hematology   Anesthesia Other Findings Full beard  Reproductive/Obstetrics                             Anesthesia Physical Anesthesia Plan  ASA: 4  Anesthesia Plan: MAC   Post-op Pain Management: Minimal or no pain anticipated   Induction: Intravenous  PONV Risk Score and Plan: 1 and Propofol infusion and Treatment may vary due to age or medical condition  Airway Management Planned: Nasal Cannula, Natural Airway and Simple Face Mask  Additional Equipment: Arterial line  Intra-op Plan:   Post-operative Plan:   Informed Consent: I have reviewed the patients History and Physical, chart, labs and discussed the procedure including the risks, benefits and alternatives for the proposed anesthesia with the patient or authorized representative who has indicated his/her understanding and  acceptance.     Dental advisory given  Plan Discussed with: CRNA  Anesthesia Plan Comments:        Anesthesia Quick Evaluation

## 2022-01-15 NOTE — Progress Notes (Signed)
Echocardiogram 2D Echocardiogram has been performed.  Fidel Levy 01/15/2022, 4:00 PM

## 2022-01-15 NOTE — Op Note (Signed)
OPERATIVE NOTE: Patient Name: Stephen Maldonado Date of Birth: December 27, 1955 Date of Operation: '@DOS'$ @  PRE-OPERATIVE DIAGNOSIS: Severe, Symptomatic Aortic Stenosis   POST-OPERATIVE DIAGNOSIS: Same   OPERATION: Transfemoral Sapien S3 16m Aortic Valve Placement(SN [id number]   SURGEON: DPierre BaliEnter MD   Co-Surgeon: MBurnell Blanks MD    EBL 10cc   FINDINGS: Good implant depth No significant AI  LVEDP prior to TAVR 18-20   SPECIMENS: None   COMPLICATIONS: None   TUBES:  None   PROCEDURE IN DETAIL: The patient was brought in the operating room and laid in supine position.  The patient was prepped and draped in standard fashion. Arterial and venous lines were placed by anesthesia along under sedation. After a timeout was performed, right and left transfemoral access was gained with ultrasound guidance. The venous access was used to advance a temporary pacer into the right heart.    On the left femoral artery, a 6Fr sheath was placed and wire and pigtail through this into the noncoronary cusp.    We then used micropuncture techniques and ultrasound to approach the right groin and gained access and placed 2 Perclose devices. Via an 8 Fr sheath an Amplatz wire was placed and then exchanged for a 14 Fr E-sheath. The patient was heparinized systemically and ACT verified > 250 seconds.   Then an AL caltheter was used with flexible straight tip to get into the LV. No BAV performed. Then exchanged over a pigtail for a pre-curled wire.   An Edwards Sapien 3 THV (size 26 mm) was prepared and crimped per manufacturer's guidelines, and the proper orientation of the valve is confirmed on the [employer]delivery system. The valve was advanced through the introducer sheath using normal technique until in an appropriate position in the abdominal aorta beyond the sheath tip. The balloon was then retracted and using the fine-tuning wheel was centered on the valve. The valve was  then advanced across the aortic arch using appropriate flexion of the catheter. The valve was carefully positioned across the aortic valve annulus. The Commander catheter was retracted using normal technique. Once final position of the valve has been confirmed by angiographic assessment, the valve is deployed while temporarily holding ventilation and during rapid ventricular pacing to maintain systolic blood pressure < 50 mmHg and pulse pressure < 10 mmHg. The balloon inflation is held for >3 seconds after reaching full deployment volume. Once the balloon has fully deflated the balloon is retracted into the ascending aorta and valve function is assessed using TTE. There is felt to be no paravalvular leak and no central aortic insufficiency.  The patient's hemodynamic recovery following valve deployment is good.  The deployment balloon and guidewire are both removed. Echo demostrated acceptable post-procedural gradients, stable mitral valve function, and no AI.  Implant depth was excellent. Hemodynamics were good. We then removed the sheath and deployed perclose x 2. Protamine was administered.    Sheaths were removed and closure devices had been completed. I defer to Dr. MAngelena Formregarding the left sided artery closure.    The patient had a stable status and was transferred to the postoperative care unit in stable condition. All surgical counts were correct.

## 2022-01-15 NOTE — Anesthesia Procedure Notes (Signed)
Procedure Name: MAC Date/Time: 01/15/2022 2:10 PM  Performed by: Anastasio Auerbach, CRNAPre-anesthesia Checklist: Emergency Drugs available, Suction available and Patient being monitored Oxygen Delivery Method: Simple face mask Induction Type: IV induction

## 2022-01-15 NOTE — Interval H&P Note (Signed)
History and Physical Interval Note:  01/15/2022 12:56 PM  Stephen Maldonado  has presented today for surgery, with the diagnosis of Severe Aortic Stenosis.  The various methods of treatment have been discussed with the patient and family. After consideration of risks, benefits and other options for treatment, the patient has consented to  Procedure(s): Transcatheter Aortic Valve Replacement, Transfemoral (N/A) INTRAOPERATIVE TRANSTHORACIC ECHOCARDIOGRAM (N/A) as a surgical intervention.  The patient's history has been reviewed, patient examined, no change in status, stable for surgery.  I have reviewed the patient's chart and labs.  Questions were answered to the patient's satisfaction.     [name]

## 2022-01-15 NOTE — Progress Notes (Signed)
  [facility]VE TEAM  Patient doing well s/p TAVR. He is hemodynamically stable. Groin sites stable. ECG with no high grade block. Plan to DC arterial line and transfer to 4E.  Plan for early ambulation after bedrest completed and hopeful discharge over the next 24-48 hours.   Angelena Form PA-C  [facility]  Pager [phone]

## 2022-01-15 NOTE — H&P (Signed)
[address]       [place] [address]             [phone]                                                   Jasmeet A Markey Beaver Medical Record #[id number] Date of Birth: 12-19-1955   Referring: Nigel Mormon, MD Primary Care: Josetta Huddle, MD Primary Cardiologist: None   Chief Complaint:        Chief Complaint  Patient presents with   Aortic Stenosis      Surgical consult for TAVR v/s SAVR, review all testing      History of Present Illness:     65 y.o. Caucasian male with hypertension, hyperlipidemia, h/o NICM with recovered LVEF, PAF, moderate obesity, OSA, now with moderate to severe aortic stenosis.   Patient is here today with his wife.    He does have increasing fatigue. Not able to do what he did a year ago.              Past Medical History:  Diagnosis Date   Arthritis      knees   Bladder spasms     BPH (benign prostatic hyperplasia)     DVT (deep venous thrombosis) ([facility]) ocyt 2016    left leg tx medically   GERD (gastroesophageal reflux disease)     History of colon polyps      hyperplastic 06-14-2010   Hydronephrosis     Hypertension     Obesity, morbid ([place])     Urinary retention             Past Surgical History:  Procedure Laterality Date   CARDIOVERSION N/A 08/14/2018    Procedure: CARDIOVERSION;  Surgeon: Nigel Mormon, MD;  Location: [facility];  Service: Cardiovascular;  Laterality: N/A;   COLONOSCOPY   06/14/2010   CORONARY ANGIOGRAPHY N/A 07/11/2016    Procedure: Coronary Angiography;  Surgeon: Dixie Dials, MD;  Location: [facility];  Service: Cardiovascular;  Laterality: N/A;   CYSTOSCOPY W/ RETROGRADES Bilateral 10/03/2015    Procedure: CYSTOSCOPY WITH RETROGRADE PYELOGRAM;  Surgeon: [name], MD;  Location: [facility];  Service: Urology;  Laterality: Bilateral;   GREEN LIGHT LASER TURP (TRANSURETHRAL RESECTION OF PROSTATE N/A 10/03/2015     Procedure: GREEN LIGHT LASER,  TURP - STAGED(TRANSURETHRAL RESECTION OF PROSTATE;  Surgeon: [name], MD;  Location: [facility];  Service: Urology;  Laterality: N/A;   HAND SURGERY Left     INTRAVASCULAR PRESSURE WIRE/FFR STUDY N/A 07/14/2018    Procedure: INTRAVASCULAR PRESSURE WIRE/FFR STUDY;  Surgeon: Nigel Mormon, MD;  Location: [facility];  Service: Cardiovascular;  Laterality: N/A;   KNEE ARTHROSCOPY        not sure which knee   LEFT HEART CATH AND CORONARY ANGIOGRAPHY N/A 07/14/2018    Procedure: LEFT HEART CATH AND CORONARY ANGIOGRAPHY;  Surgeon: Nigel Mormon, MD;  Location: [facility];  Service: Cardiovascular;  Laterality: N/A;   QUADRICEPS TENDON REPAIR Bilateral left 02/24/2003/   right 2006   RIGHT/LEFT HEART CATH AND CORONARY ANGIOGRAPHY N/A 11/13/2021    Procedure: RIGHT/LEFT HEART CATH AND CORONARY ANGIOGRAPHY;  Surgeon: Nigel Mormon, MD;  Location: [facility];  Service: Cardiovascular;  Laterality: N/A;   TOTAL KNEE ARTHROPLASTY Right 12/01/2015    Procedure: RIGHT TOTAL KNEE ARTHROPLASTY;  Surgeon: [name], MD;  Location: [facility];  Service: Orthopedics;  Laterality: Right;   TOTAL KNEE ARTHROPLASTY Left 01/10/2017    Procedure: LEFT TOTAL KNEE ARTHROPLASTY;  Surgeon: [name], MD;  Location: [facility];  Service: Orthopedics;  Laterality: Left;   TOTAL SHOULDER ARTHROPLASTY Right 01/23/2018    Procedure: RIGHT REVERSE TOTAL SHOULDER ARTHROPLASTY;  Surgeon: Meredith Pel, MD;  Location: [facility];  Service: Orthopedics;  Laterality: Right;           Family History  Problem Relation Age of Onset   Lung cancer Mother     Breast cancer Mother     Diabetes Mother     Heart failure Father     CAD Father     Diabetes Father          Social History        Tobacco Use  Smoking Status Never  Smokeless Tobacco Former   Types: Chew   Quit date: 02/05/1968    Social History         Substance and Sexual Activity  Alcohol Use Yes    Comment: 1-2 beers per month             Allergies  Allergen Reactions   Atorvastatin Other (See Comments)      Muscle fatigue, soreness and dreams crazy Other reaction(s): severe arthralgias/myalgias Other reaction(s): severe arthralgias/myalgias   Aspirin Other (See Comments)      Avoid to kidney function Other reaction(s): **Reduced Kidney Function Avoid to kidney function   Other reaction(s): **Reduced Kidney Function   Simvastatin Other (See Comments)      Fatigue, soreness   Statins Other (See Comments)      Fatigue, soreness            Current Outpatient Medications  Medication Sig Dispense Refill   apixaban (ELIQUIS) 5 MG TABS tablet Take 1 tablet (5 mg total) by mouth 2 (two) times daily. Resume on 10/11 morning 180 tablet 3   carvedilol (COREG) 12.5 MG tablet Take 1 tablet (12.5 mg total) by mouth 2 (two) times daily with a meal. 90 tablet 3   Cholecalciferol (VITAMIN D) 50 MCG (2000 UT) tablet Take 2,000 Units by mouth daily.       Coenzyme Q10 (COQ-10) 200 MG CAPS Take 200 mg by mouth daily.       cyclobenzaprine (FLEXERIL) 10 MG tablet         furosemide (LASIX) 80 MG tablet Take 1 tablet (80 mg total) by mouth 2 (two) times daily. 90 tablet 2   Garlic 7824 MG CAPS Take 1,000 mg by mouth daily.       hydrALAZINE (APRESOLINE) 25 MG tablet Take 25 mg by mouth 2 (two) times daily with a meal.   3   naphazoline-pheniramine (ALLERGY EYE) 0.025-0.3 % ophthalmic solution Place 1 drop into both eyes 4 (four) times daily as needed for eye irritation.       Omega-3 Fatty Acids (FISH OIL) 1000 MG CAPS Take 1,000 mg by mouth daily.        Potassium Chloride ER 20 MEQ TBCR Take 20 mEq by mouth daily. with food       tamsulosin (FLOMAX) 0.4 MG CAPS capsule Take 0.4 mg by mouth every morning.       Turmeric 500 MG TABS Take 500 mg by mouth daily.  vitamin B-12 (CYANOCOBALAMIN) 1000 MCG tablet Take 1,000 mcg by mouth  daily.       vitamin C (ASCORBIC ACID) 500 MG tablet Take 500 mg by mouth daily.        Colchicine 0.6 MG CAPS 1 capsule (Patient not taking: Reported on 12/13/2021)       isosorbide mononitrate (IMDUR) 30 MG 24 hr tablet Take 1 tablet (30 mg total) by mouth daily. 90 tablet 3   Ketotifen Fumarate (ALLERGY EYE DROPS OP) Place 1 drop into both eyes daily as needed (allergies). (Patient not taking: Reported on 12/13/2021)        No current facility-administered medications for this visit.      ROS 14 point ROS reviewed and negative except as per HPI     PHYSICAL EXAMINATION: BP (!) 150/90   Pulse 68   Resp 20   Ht '5\' 11"'$  (1.803 m)   Wt 285 lb (129.3 kg)   SpO2 95% Comment: RA  BMI 39.75 kg/m    Gen: NAD Neuro: Alert and oriented Resp: Nonlaboured Abd: Soft, ntnd Extr: WWP   Diagnostic Studies & Laboratory data:     Recent Radiology Findings:    Imaging Results  CT CORONARY MORPH W/CTA COR W/SCORE W/CA W/CM &/OR WO/CM   Addendum Date: 12/05/2021   ADDENDUM REPORT: 12/05/2021 13:51 CLINICAL DATA:  Severe Aortic Stenosis. EXAM: Cardiac TAVR CT TECHNIQUE: A non-contrast, gated CT scan was obtained with axial slices of 3 mm through the heart for aortic valve calcium scoring. A 120 kV retrospective, gated, contrast cardiac scan was obtained. Gantry rotation speed was 250 msecs and collimation was 0.6 mm. Nitroglycerin was not given. The 3D data set was reconstructed in 5% intervals of the 0-95% of the R-R cycle. Systolic and diastolic phases were analyzed on a dedicated workstation using MPR, MIP, and VRT modes. The patient received 100 cc of contrast. FINDINGS: Image quality: Average. Contrast bolus noted to be in the subclavian vein. Poor opacification but interpretable study. Noise artifact is: Limited. Valve Morphology: Tricuspid aortic valve with severe calcifications. Restricted leaflet motion in systole. Aortic Valve Calcium score: 2498 Aortic annular dimension: Phase assessed: 20%  Annular area: 541 mm2 Annular perimeter: 83.4 mm Max diameter: 27.9 mm Min diameter: 24.4 mm Annular and subannular calcification: Mild annular calcium under the Eugenio Saenz. Membranous septum length: 7.36 Optimal coplanar projection: LAO 20 CRA 1 Coronary Artery Height above Annulus: Left Main: 10.3 mm Right Coronary: 17.0 mm Sinus of Valsalva Measurements: Non-coronary: 35 mm Right-coronary: 35 mm Left-coronary: 36 mm Sinus of Valsalva Height: Non-coronary: 26.5 mm Right-coronary: 22.8 mm Left-coronary: 20 mm Sinotubular Junction: 28 mm Ascending Thoracic Aorta: 34 mm Coronary Arteries: Normal coronary origin. Right dominance. The study was performed without use of NTG and is insufficient for plaque evaluation. Please refer to recent cardiac catheterization for coronary assessment. Cardiac Morphology: Right Atrium: Right atrial size is within normal limits. Right Ventricle: The right ventricular cavity is dilated. Left Atrium: Left atrial size is normal in size with no left atrial appendage filling defect. Left Ventricle: The ventricular cavity size is within normal limits. Pulmonary arteries: Dilated pulmonary artery suggestive of pulmonary hypertension. Pulmonary veins: Normal pulmonary venous drainage. Pericardium: Normal thickness with no significant effusion or calcium present. Mitral Valve: The mitral valve is normal structure without significant calcification. Extra-cardiac findings: See attached radiology report for non-cardiac structures. IMPRESSION: 1. Tricuspid aortic valve with severe aortic stenosis. 2. Annular measurements are in between a 26/29 mm S3 (541 mm2). Perimeter supports  a 34 mm Evolut Pro. 3. No significant annular or subannular calcifications. 4. Borderline left main height (10.3 mm). 5. Optimal Fluoroscopic Angle for Delivery: LAO 20 CRA 1 6. Dilated pulmonary artery suggestive of pulmonary hypertension. [place] T. Audie Box, MD Electronically Signed   By: Eleonore Chiquito M.D.   On: 12/05/2021 13:51     Result Date: 12/05/2021 EXAM: OVER-READ INTERPRETATION  CT CHEST The following report is a limited chest CT over-read performed by radiologist Dr. Yetta Glassman of [facility], [place] on 12/04/2021. This over-read does not include interpretation of cardiac or coronary anatomy or pathology. The cardiac CTA interpretation by the cardiologist is attached. COMPARISON:  None Available. FINDINGS: Extracardiac findings will be described separately under dictation for contemporaneously obtained CTA chest, abdomen and pelvis. IMPRESSION: Please see separate dictation for contemporaneously obtained CTA chest, abdomen and pelvis dated 12/04/2021 for full description of relevant extracardiac findings. Electronically Signed: By: Yetta Glassman M.D. On: 12/04/2021 17:35    CT ANGIO ABDOMEN PELVIS  W &/OR WO CONTRAST   Result Date: 12/04/2021 CLINICAL DATA:  Preop evaluation for aortic valve replacement EXAM: CT ANGIOGRAPHY CHEST, ABDOMEN AND PELVIS TECHNIQUE: Non-contrast CT of the chest was initially obtained. Multidetector CT imaging through the chest, abdomen and pelvis was performed using the standard protocol during bolus administration of intravenous contrast. Multiplanar reconstructed images and MIPs were obtained and reviewed to evaluate the vascular anatomy. RADIATION DOSE REDUCTION: This exam was performed according to the departmental dose-optimization program which includes automated exposure control, adjustment of the mA and/or kV according to patient size and/or use of iterative reconstruction technique. CONTRAST:  157m OMNIPAQUE IOHEXOL 350 MG/ML SOLN COMPARISON:  CT abdomen and pelvis dated September 15, 2015 FINDINGS: CTA CHEST FINDINGS Cardiovascular: Normal heart size. No pericardial effusion. Normal caliber thoracic aorta with mild atherosclerotic disease. Aortic valve thickening and calcifications. Left main and three-vessel coronary artery calcifications. Standard three-vessel aortic arch  with no significant stenosis. Mediastinum/Nodes: Esophagus and thyroid are unremarkable. No pathologically enlarged lymph nodes seen in the chest. Lungs/Pleura: Central airways are patent. No consolidation, pleural effusion or pneumothorax. Small solid pulmonary nodule of the right lower lobe measuring 4 mm on series 5, image 57. Musculoskeletal: No chest wall abnormality. Prior right total shoulder arthroplasty. No acute or significant osseous findings. CTA ABDOMEN AND PELVIS FINDINGS Hepatobiliary: Numerous low-attenuation liver lesions which are similar to prior exam and likely simple cysts. No suspicious liver lesion. Gallstones with no evidence of gallbladder wall thickening. No biliary ductal dilation. Pancreas: High attenuation nodule along the anterior tail of the pancreas measuring 6 mm on series 4, image 132. No pancreatic ductal dilatation or surrounding inflammatory changes. Spleen: Normal in size without focal abnormality. Adrenals/Urinary Tract: Bilateral adrenal glands are unremarkable. No hydronephrosis nephrolithiasis. Mild bilateral hydroureter and tortuosity, unchanged when compared to prior. Bilateral low-attenuation renal lesions, largest are compatible with simple cysts, others are too small to completely characterize, no specific follow-up imaging is recommended for these lesions. Bladder is unremarkable. Stomach/Bowel: Stomach is within normal limits. Appendix appears normal. Diverticulosis. No evidence of bowel wall thickening, distention, or inflammatory changes. Vascular/lymphatic: Normal caliber abdominal aorta with moderate atherosclerotic disease. No pathologically enlarged lymph nodes seen in the abdomen or pelvis. Reproductive: Prostatomegaly with median lobe hypertrophy, measuring up to 6.0 cm. Other: No abdominal wall hernia or abnormality. No abdominopelvic ascites. Musculoskeletal: No acute or significant osseous findings. VASCULAR MEASUREMENTS PERTINENT TO TAVR: AORTA: Minimal  Aortic Diameter-16.8 mm Severity of Aortic Calcification-moderate RIGHT PELVIS: Right Common Iliac Artery -  Minimal Diameter-10.5 mm Tortuosity-none Calcification-moderate Right External Iliac Artery - Minimal Diameter-9.1 mm Tortuosity-mild Calcification-mild Right Common Femoral Artery - Minimal Diameter-8.2 mm Tortuosity-none Calcification-mild LEFT PELVIS: Left Common Iliac Artery - Minimal Diameter-11.7 mm Tortuosity-none Calcification-moderate Left External Iliac Artery - Minimal Diameter-8.3 mm Tortuosity-mild Calcification-moderate Left Common Femoral Artery - Minimal Diameter-7.5 mm Tortuosity-none Calcification-mild Review of the MIP images confirms the above findings. IMPRESSION: 1. Vascular findings and measurements pertinent to potential TAVR procedure, as detailed above. 2. Thickening and calcification of the aortic valve, compatible with reported clinical history of aortic stenosis. 3. Moderate aortoiliac atherosclerosis. Left main and 3 vessel coronary artery disease. 4. Tiny enhancing nodule along the anterior aspect of the pancreatic tail, incompletely characterized on this examination primary differential considerations include high attenuation focus of pancreatic parenchyma, intra pancreatic splenule or pancreatic neuroendocrine tumor. Consider more definitive characterization by MRI with and without contrast and assessment of biochemical profile for functionality. 5. Prostatomegaly with median lobe hypertrophy. 6. Small solid pulmonary nodule of the right lower lobe measuring 4 mm. No follow-up needed if patient is low-risk.This recommendation follows the consensus statement: Guidelines for Management of Incidental Pulmonary Nodules Detected on CT Images: From the Fleischner Society 2017; Radiology 2017; 284:228-243. Electronically Signed   By: Yetta Glassman M.D.   On: 12/04/2021 17:34    CT ANGIO CHEST AORTA W/CM & OR WO/CM   Result Date: 12/04/2021 CLINICAL DATA:  Preop evaluation for  aortic valve replacement EXAM: CT ANGIOGRAPHY CHEST, ABDOMEN AND PELVIS TECHNIQUE: Non-contrast CT of the chest was initially obtained. Multidetector CT imaging through the chest, abdomen and pelvis was performed using the standard protocol during bolus administration of intravenous contrast. Multiplanar reconstructed images and MIPs were obtained and reviewed to evaluate the vascular anatomy. RADIATION DOSE REDUCTION: This exam was performed according to the departmental dose-optimization program which includes automated exposure control, adjustment of the mA and/or kV according to patient size and/or use of iterative reconstruction technique. CONTRAST:  168m OMNIPAQUE IOHEXOL 350 MG/ML SOLN COMPARISON:  CT abdomen and pelvis dated September 15, 2015 FINDINGS: CTA CHEST FINDINGS Cardiovascular: Normal heart size. No pericardial effusion. Normal caliber thoracic aorta with mild atherosclerotic disease. Aortic valve thickening and calcifications. Left main and three-vessel coronary artery calcifications. Standard three-vessel aortic arch with no significant stenosis. Mediastinum/Nodes: Esophagus and thyroid are unremarkable. No pathologically enlarged lymph nodes seen in the chest. Lungs/Pleura: Central airways are patent. No consolidation, pleural effusion or pneumothorax. Small solid pulmonary nodule of the right lower lobe measuring 4 mm on series 5, image 57. Musculoskeletal: No chest wall abnormality. Prior right total shoulder arthroplasty. No acute or significant osseous findings. CTA ABDOMEN AND PELVIS FINDINGS Hepatobiliary: Numerous low-attenuation liver lesions which are similar to prior exam and likely simple cysts. No suspicious liver lesion. Gallstones with no evidence of gallbladder wall thickening. No biliary ductal dilation. Pancreas: High attenuation nodule along the anterior tail of the pancreas measuring 6 mm on series 4, image 132. No pancreatic ductal dilatation or surrounding inflammatory  changes. Spleen: Normal in size without focal abnormality. Adrenals/Urinary Tract: Bilateral adrenal glands are unremarkable. No hydronephrosis nephrolithiasis. Mild bilateral hydroureter and tortuosity, unchanged when compared to prior. Bilateral low-attenuation renal lesions, largest are compatible with simple cysts, others are too small to completely characterize, no specific follow-up imaging is recommended for these lesions. Bladder is unremarkable. Stomach/Bowel: Stomach is within normal limits. Appendix appears normal. Diverticulosis. No evidence of bowel wall thickening, distention, or inflammatory changes. Vascular/lymphatic: Normal caliber abdominal aorta with moderate atherosclerotic disease. No  pathologically enlarged lymph nodes seen in the abdomen or pelvis. Reproductive: Prostatomegaly with median lobe hypertrophy, measuring up to 6.0 cm. Other: No abdominal wall hernia or abnormality. No abdominopelvic ascites. Musculoskeletal: No acute or significant osseous findings. VASCULAR MEASUREMENTS PERTINENT TO TAVR: AORTA: Minimal Aortic Diameter-16.8 mm Severity of Aortic Calcification-moderate RIGHT PELVIS: Right Common Iliac Artery - Minimal Diameter-10.5 mm Tortuosity-none Calcification-moderate Right External Iliac Artery - Minimal Diameter-9.1 mm Tortuosity-mild Calcification-mild Right Common Femoral Artery - Minimal Diameter-8.2 mm Tortuosity-none Calcification-mild LEFT PELVIS: Left Common Iliac Artery - Minimal Diameter-11.7 mm Tortuosity-none Calcification-moderate Left External Iliac Artery - Minimal Diameter-8.3 mm Tortuosity-mild Calcification-moderate Left Common Femoral Artery - Minimal Diameter-7.5 mm Tortuosity-none Calcification-mild Review of the MIP images confirms the above findings. IMPRESSION: 1. Vascular findings and measurements pertinent to potential TAVR procedure, as detailed above. 2. Thickening and calcification of the aortic valve, compatible with reported clinical history of  aortic stenosis. 3. Moderate aortoiliac atherosclerosis. Left main and 3 vessel coronary artery disease. 4. Tiny enhancing nodule along the anterior aspect of the pancreatic tail, incompletely characterized on this examination primary differential considerations include high attenuation focus of pancreatic parenchyma, intra pancreatic splenule or pancreatic neuroendocrine tumor. Consider more definitive characterization by MRI with and without contrast and assessment of biochemical profile for functionality. 5. Prostatomegaly with median lobe hypertrophy. 6. Small solid pulmonary nodule of the right lower lobe measuring 4 mm. No follow-up needed if patient is low-risk.This recommendation follows the consensus statement: Guidelines for Management of Incidental Pulmonary Nodules Detected on CT Images: From the Fleischner Society 2017; Radiology 2017; 284:228-243. Electronically Signed   By: Yetta Glassman M.D.   On: 12/04/2021 17:34           I have independently reviewed the above radiology studies  and reviewed the findings with the patient.    Recent Lab Findings: Recent Labs       Lab Results  Component Value Date    WBC 13.0 (H) 11/07/2021    HGB 13.9 11/13/2021    HGB 13.6 11/13/2021    HCT 41.0 11/13/2021    HCT 40.0 11/13/2021    PLT 242 11/07/2021    GLUCOSE 132 (H) 12/04/2021    CHOL 157 07/11/2016    TRIG 74 07/11/2016    HDL 45 07/11/2016    LDLCALC 97 07/11/2016    NA 139 12/04/2021    K 3.9 12/04/2021    CL 104 12/04/2021    CREATININE 1.90 (H) 12/04/2021    BUN 30 (H) 12/04/2021    CO2 25 12/04/2021    INR 0.97 07/11/2016          Assessment / Plan:   66 y.o. Caucasian male with hypertension, hyperlipidemia, h/o NICM, PAF now with severe symptomatic aortic stenosis.   Meets criteria for AV replacement.    Discussed TAVR vs SAVR.  For surgical options, he does have a mid-circ lesion and proximal right that could be treated with veins in addition to an appendage  clip and MAZE.    He is going to have an easier recovery with TAVR. Especially given the renal dysfunction, obesity, and prior cardiomyopathy.    He chose TAVR. This was after a full discussion of risks/benefits/alternatives and expected hospital course. All questions asked and answered. He understands this valve should last for years, but there is a chance he may need open surgery later in life.   Valve: 26 Edwards S3.  (Annulus 548m2. LM height of 1108m Bailout: Yes Access: Transfemoral NYHA: II Misc procedural:  On Eliquis      I  spent 30 minutes with  the patient face to face in counseling and coordination of care.     Pierre Bali Ottis Vacha 12/16/2021 9:45 PM

## 2022-01-16 ENCOUNTER — Inpatient Hospital Stay (HOSPITAL_COMMUNITY): Payer: Medicare HMO

## 2022-01-16 DIAGNOSIS — Z952 Presence of prosthetic heart valve: Secondary | ICD-10-CM

## 2022-01-16 LAB — CBC
HCT: 41.7 % (ref 39.0–52.0)
Hemoglobin: 14.2 g/dL (ref 13.0–17.0)
MCH: 29.4 pg (ref 26.0–34.0)
MCHC: 34.1 g/dL (ref 30.0–36.0)
MCV: 86.3 fL (ref 80.0–100.0)
Platelets: 152 10*3/uL (ref 150–400)
RBC: 4.83 MIL/uL (ref 4.22–5.81)
RDW: 14.1 % (ref 11.5–15.5)
WBC: 8.7 10*3/uL (ref 4.0–10.5)
nRBC: 0 % (ref 0.0–0.2)

## 2022-01-16 LAB — BASIC METABOLIC PANEL
Anion gap: 7 (ref 5–15)
BUN: 25 mg/dL — ABNORMAL HIGH (ref 8–23)
CO2: 24 mmol/L (ref 22–32)
Calcium: 8.8 mg/dL — ABNORMAL LOW (ref 8.9–10.3)
Chloride: 107 mmol/L (ref 98–111)
Creatinine, Ser: 1.51 mg/dL — ABNORMAL HIGH (ref 0.61–1.24)
GFR, Estimated: 50 mL/min — ABNORMAL LOW (ref >60)
Glucose, Bld: 149 mg/dL — ABNORMAL HIGH (ref 70–99)
Potassium: 4.3 mmol/L (ref 3.5–5.1)
Sodium: 138 mmol/L (ref 135–145)

## 2022-01-16 LAB — ECHOCARDIOGRAM COMPLETE
AR max vel: 2.36 cm2
AV Area VTI: 2.41 cm2
AV Area mean vel: 2.39 cm2
AV Mean grad: 13.5 mmHg
AV Peak grad: 21.8 mmHg
Ao pk vel: 2.34 m/s
Area-P 1/2: 2.86 cm2
Height: 71 in
S' Lateral: 3.7 cm
Weight: 4687.86 oz

## 2022-01-16 LAB — MAGNESIUM: Magnesium: 2.1 mg/dL (ref 1.7–2.4)

## 2022-01-16 MED ORDER — LIDOCAINE 2% (20 MG/ML) 5 ML SYRINGE
INTRAMUSCULAR | Status: AC
  Filled 2022-01-16: qty 5

## 2022-01-16 MED ORDER — SUGAMMADEX SODIUM 500 MG/5ML IV SOLN
INTRAVENOUS | Status: AC
  Filled 2022-01-16: qty 5

## 2022-01-16 MED ORDER — PHENYLEPHRINE HCL-NACL 20-0.9 MG/250ML-% IV SOLN
INTRAVENOUS | Status: AC
  Filled 2022-01-16: qty 3000

## 2022-01-16 MED ORDER — CEFAZOLIN SODIUM 1 G IJ SOLR
INTRAMUSCULAR | Status: AC
  Filled 2022-01-16: qty 30

## 2022-01-16 MED ORDER — PHENYLEPHRINE 80 MCG/ML (10ML) SYRINGE FOR IV PUSH (FOR BLOOD PRESSURE SUPPORT)
PREFILLED_SYRINGE | INTRAVENOUS | Status: AC
  Filled 2022-01-16: qty 10

## 2022-01-16 MED ORDER — ROCURONIUM BROMIDE 10 MG/ML (PF) SYRINGE
PREFILLED_SYRINGE | INTRAVENOUS | Status: AC
  Filled 2022-01-16: qty 10

## 2022-01-16 MED ORDER — DEXAMETHASONE SODIUM PHOSPHATE 10 MG/ML IJ SOLN
INTRAMUSCULAR | Status: AC
  Filled 2022-01-16: qty 1

## 2022-01-16 MED ORDER — ONDANSETRON HCL 4 MG/2ML IJ SOLN
INTRAMUSCULAR | Status: AC
  Filled 2022-01-16: qty 2

## 2022-01-16 MED FILL — Heparin Sod (Porcine)-NaCl IV Soln 1000 Unit/500ML-0.9%: INTRAVENOUS | Qty: 500 | Status: AC

## 2022-01-16 MED FILL — Lidocaine HCl Local Inj 1%: INTRAMUSCULAR | Qty: 20 | Status: AC

## 2022-01-16 NOTE — Plan of Care (Signed)
?  Problem: Clinical Measurements: ?Goal: Will remain free from infection ?Outcome: Progressing ?Goal: Diagnostic test results will improve ?Outcome: Progressing ?Goal: Respiratory complications will improve ?Outcome: Progressing ?  ?

## 2022-01-16 NOTE — Progress Notes (Signed)
Discharge instructions reviewed with pt and his wife.  Copy of instructions given to pt. No new scripts. Pt had no new questions at this time.  Pt to be d/c'd via wheelchair with belongings, with wife.         To be escorted by hospital volunteer.

## 2022-01-16 NOTE — Progress Notes (Addendum)
CARDIAC REHAB PHASE I   PRE:  Rate/Rhythm: NSR, 70    BP: sitting 150/70    SaO2: 99%, RA  MODE:  Ambulation: 150 ft   POST:  Rate/Rhythm: NSR, 92    BP: sitting 158/70     SaO2: 99%, RA   Pt ambulated with RN without complaints of baseline knee and back pain. Pt educated on diet, exercise, and wound precautions. Pt understands without assistance. Pt declines Cardiac Rehab as an outpatient.   Service time is from [place] to [place], RN, BSN 01/16/2022 9:28 AM

## 2022-01-16 NOTE — Anesthesia Postprocedure Evaluation (Signed)
Anesthesia Post Note  Patient: Stephen Maldonado  Procedure(s) Performed: Transcatheter Aortic Valve Replacement, Transfemoral INTRAOPERATIVE TRANSTHORACIC ECHOCARDIOGRAM     Patient location during evaluation: SICU Anesthesia Type: MAC Level of consciousness: awake Pain management: pain level controlled Vital Signs Assessment: post-procedure vital signs reviewed and stable Respiratory status: spontaneous breathing Cardiovascular status: stable Postop Assessment: no apparent nausea or vomiting Anesthetic complications: no   There were no known notable events for this encounter.  Last Vitals:  Vitals:   01/16/22 0510 01/16/22 0737  BP: (!) 160/77 (!) 134/96  Pulse: 83 70  Resp: 16 18  Temp: 36.9 C 36.6 C  SpO2: 98% 98%    Last Pain:  Vitals:   01/16/22 0931  TempSrc:   PainSc: 0-No pain                 Jelissa Espiritu

## 2022-01-16 NOTE — Progress Notes (Signed)
  Echocardiogram 2D Echocardiogram post TAVR has been performed.  Darlina Sicilian M 01/16/2022, 9:20 AM

## 2022-01-17 ENCOUNTER — Telehealth: Payer: Self-pay | Admitting: Physician Assistant

## 2022-01-17 NOTE — Telephone Encounter (Signed)
  [facility]   MULTIDISCIPLINARY HEART VALVE TEAM   Attempted TOC call. Not able to leave a VM  [place] Form PA-C  [facility]

## 2022-01-17 NOTE — Telephone Encounter (Signed)
  [place] VALVE TEAM   Patient contacted regarding discharge from [facility] on 12/13  Patient understands to follow up with a structural heart APP on 12/20 at [place].  Patient understands discharge instructions? yes Patient understands medications and regimen? yes Patient understands to bring all medications to this visit? yes  [place] Form PA-C  [facility]

## 2022-01-22 ENCOUNTER — Other Ambulatory Visit: Payer: Self-pay | Admitting: Cardiology

## 2022-01-22 DIAGNOSIS — I350 Nonrheumatic aortic (valve) stenosis: Secondary | ICD-10-CM

## 2022-01-22 NOTE — Progress Notes (Unsigned)
HEART AND [place]                                     Cardiology Office Note:    Date:  01/24/2022   ID:  Stephen Maldonado, DOB [birth year], MRN [id number]  PCP:  Kathalene Frames, MD  [facility] HeartCare Cardiologist: Dr. Virgina Jock / Dr Angelena Form & Dr. Tenny Craw (TAVR)  [facility] HeartCare Electrophysiologist:  None   Referring MD: Josetta Huddle, MD   [facility] Cabela [facility] s/p TAVR  History of Present Illness:    Stephen Maldonado is a 66 y.o. male with a hx of CAD, CKD IIIb, HTN, HLD, GERD, prior DVT, NICM with recovery of normal LV function, paroxysmal a-fib on Eliquis, obesity, sleep apnea, and moderate-severe AS s/p TAVR (01/15/22) who presents to clinic for follow up.   He has a history of aortic stenosis that has been followed closely by Dr. Virgina Jock. He recently reported feeling poorly with constant fatigue and dizziness. Echo 09/18/21 showed EF 65%, severe LFLG AS with a mean grad 17.3 mmHg, peak grad 32.2 mmHg, AVA 0.91cm2, DVI 0.24, SVI 25, and mild MR. Cardiac cath 11/13/21 showed severe disease in the mid circumflex and moderate disease in the RCA. No disease in the LAD and severe AS with a mean gradient across the aortic valve of 39.7 mmHg.    The patient was evaluated by the multidisciplinary valve team and underwent successful TAVR with a 26 mm Edwards Sapien 3 Ultra Resilia THV via the TF approach on 01/15/22. Post operative echo showed EF 60%, severe asymmetric LVH, normally functioning TAVR with a mean gradient of 14 mmHg and trivial PVL. He was discharged home on Eliquis '5mg'$  BID.   Today the patient presents to clinic for follow up. No CP or SOB. No LE edema, orthopnea or PND. No dizziness or syncope. No blood in stool or urine. No palpitations. Doing well with an improvement in ability to walk since TAVR.    Past Medical History:  Diagnosis Date   Arthritis    knees   Bladder spasms    BPH (benign prostatic hyperplasia)    DVT (deep venous  thrombosis) ([place]) ocyt 2016   left leg tx medically   GERD (gastroesophageal reflux disease)    History of colon polyps    hyperplastic 06-14-2010   Hydronephrosis    Hypertension    Obesity, morbid (HCC)    S/P TAVR (transcatheter aortic valve replacement) 01/15/2022   s/p TAVR with a 26 mm Edwards S3UR via the TF approach by Dr. Angelena Form & Dr. Tenny Craw   Urinary retention     Past Surgical History:  Procedure Laterality Date   CARDIOVERSION N/A 08/14/2018   Procedure: CARDIOVERSION;  Surgeon: Nigel Mormon, MD;  Location: [facility];  Service: Cardiovascular;  Laterality: N/A;   COLONOSCOPY  06/14/2010   CORONARY ANGIOGRAPHY N/A 07/11/2016   Procedure: Coronary Angiography;  Surgeon: Dixie Dials, MD;  Location: [place] [facility];  Service: Cardiovascular;  Laterality: N/A;   CYSTOSCOPY W/ RETROGRADES Bilateral 10/03/2015   Procedure: CYSTOSCOPY WITH RETROGRADE PYELOGRAM;  Surgeon: [name], MD;  Location: [facility];  Service: Urology;  Laterality: Bilateral;   GREEN LIGHT LASER TURP (TRANSURETHRAL RESECTION OF PROSTATE N/A 10/03/2015   Procedure: GREEN LIGHT LASER,  TURP - STAGED(TRANSURETHRAL RESECTION OF PROSTATE;  Surgeon: [name], MD;  Location: [place];  Service: Urology;  Laterality: N/A;   HAND SURGERY Left    INTRAOPERATIVE TRANSTHORACIC ECHOCARDIOGRAM N/A 01/15/2022   Procedure: INTRAOPERATIVE TRANSTHORACIC ECHOCARDIOGRAM;  Surgeon: Burnell Blanks, MD;  Location: [place] [facility];  Service: Open Heart Surgery;  Laterality: N/A;   INTRAVASCULAR PRESSURE WIRE/FFR STUDY N/A 07/14/2018   Procedure: INTRAVASCULAR PRESSURE WIRE/FFR STUDY;  Surgeon: Nigel Mormon, MD;  Location: [place] [facility];  Service: Cardiovascular;  Laterality: N/A;   KNEE ARTHROSCOPY     not sure which knee   LEFT HEART CATH AND CORONARY ANGIOGRAPHY N/A 07/14/2018   Procedure: LEFT HEART CATH AND CORONARY ANGIOGRAPHY;   Surgeon: Nigel Mormon, MD;  Location: [place] [facility];  Service: Cardiovascular;  Laterality: N/A;   QUADRICEPS TENDON REPAIR Bilateral left 02/24/2003/   right 2006   RIGHT/LEFT HEART CATH AND CORONARY ANGIOGRAPHY N/A 11/13/2021   Procedure: RIGHT/LEFT HEART CATH AND CORONARY ANGIOGRAPHY;  Surgeon: Nigel Mormon, MD;  Location: [place] [facility];  Service: Cardiovascular;  Laterality: N/A;   TOTAL KNEE ARTHROPLASTY Right 12/01/2015   Procedure: RIGHT TOTAL KNEE ARTHROPLASTY;  Surgeon: [name], MD;  Location: [facility];  Service: Orthopedics;  Laterality: Right;   TOTAL KNEE ARTHROPLASTY Left 01/10/2017   Procedure: LEFT TOTAL KNEE ARTHROPLASTY;  Surgeon: [name], MD;  Location: [facility];  Service: Orthopedics;  Laterality: Left;   TOTAL SHOULDER ARTHROPLASTY Right 01/23/2018   Procedure: RIGHT REVERSE TOTAL SHOULDER ARTHROPLASTY;  Surgeon: Meredith Pel, MD;  Location: [place];  Service: Orthopedics;  Laterality: Right;   TRANSCATHETER AORTIC VALVE REPLACEMENT, TRANSFEMORAL N/A 01/15/2022   Procedure: Transcatheter Aortic Valve Replacement, Transfemoral;  Surgeon: Burnell Blanks, MD;  Location: [place] [facility];  Service: Open Heart Surgery;  Laterality: N/A;    Current Medications: Current Meds  Medication Sig   allopurinol (ZYLOPRIM) 100 MG tablet Take 200 mg by mouth every morning.   amoxicillin (AMOXIL) 500 MG tablet Take 4 tablets (2,000 mg total) by mouth as directed. 1 hour prior to dental work including cleanings   apixaban (ELIQUIS) 5 MG TABS tablet Take 1 tablet (5 mg total) by mouth 2 (two) times daily. Resume on 10/11 morning   carvedilol (COREG) 12.5 MG tablet Take 1 tablet (12.5 mg total) by mouth 2 (two) times daily with a meal.   Cholecalciferol (VITAMIN D) 50 MCG (2000 UT) tablet Take 2,000 Units by mouth daily.   Coenzyme Q10 (COQ-10) 200 MG CAPS Take 200 mg by mouth daily.   furosemide (LASIX) 80 MG tablet Take 1  tablet (80 mg total) by mouth 2 (two) times daily.   Garlic 3532 MG CAPS Take 1,000 mg by mouth daily.   hydrALAZINE (APRESOLINE) 25 MG tablet Take 25 mg by mouth 2 (two) times daily with a meal.   metFORMIN (GLUCOPHAGE) 500 MG tablet Take 500 mg by mouth daily.   naphazoline-pheniramine (ALLERGY EYE) 0.025-0.3 % ophthalmic solution Place 1 drop into both eyes 4 (four) times daily as needed for eye irritation.   Omega-3 Fatty Acids (FISH OIL) 1000 MG CAPS Take 1,000 mg by mouth daily.    Potassium Chloride ER 20 MEQ TBCR Take 20 mEq by mouth 2 (two) times daily. with food   tamsulosin (FLOMAX) 0.4 MG CAPS capsule Take 0.4 mg by mouth every morning.   Turmeric 500 MG TABS Take 500 mg by mouth daily.   vitamin B-12 (CYANOCOBALAMIN) 1000 MCG tablet Take 1,000 mcg by mouth daily.   vitamin C (ASCORBIC ACID)  500 MG tablet Take 500 mg by mouth daily.      Allergies:   Atorvastatin, Aspirin, Simvastatin, and Statins   Social History   Socioeconomic History   Marital status: Married    Spouse name: Not on file   Number of children: 1   Years of education: Not on file   Highest education level: Not on file  Occupational History   Occupation: Journalist, newspaper for office equipment  Tobacco Use   Smoking status: Never   Smokeless tobacco: Former    Types: Chew    Quit date: 02/05/1968  Vaping Use   Vaping Use: Never used  Substance and Sexual Activity   Alcohol use: Yes    Comment: 1-2 beers per month   Drug use: No   Sexual activity: Not Currently  Other Topics Concern   Not on file  Social History Narrative   Not on file   Social Determinants of Health   Financial Resource Strain: Not on file  Food Insecurity: No Food Insecurity (01/16/2022)   Hunger Vital Sign    Worried About Running Out of Food in the Last Year: Never true    [place] in the Last Year: Never true  Transportation Needs: No Transportation Needs (01/16/2022)   PRAPARE - Radiographer, therapeutic (Medical): No    Lack of Transportation (Non-Medical): No  Physical Activity: Not on file  Stress: Not on file  Social Connections: Not on file     Family History: The patient's family history includes Breast cancer in his mother; CAD in his father; Diabetes in his father and mother; Heart failure in his father; Lung cancer in his mother.  ROS:   Please see the history of present illness.    All other systems reviewed and are negative.  EKGs/Labs/Other Studies Reviewed:    The following studies were reviewed today:  [facility]  TAVR OPERATIVE NOTE     Date of Procedure:                01/15/2022   Preoperative Diagnosis:      Severe Aortic Stenosis    Postoperative Diagnosis:    Same    Procedure:        Transcatheter Aortic Valve Replacement - Transfemoral Approach             Edwards Sapien 3 THV (size 26 mm, model # [id number]  serial # [id number])              Co-Surgeons:                        Lauree Chandler, MD and Justice Rocher, MD    Anesthesiologist:                  Nyoka Cowden   Echocardiographer:              Gasper Sells   Pre-operative Echo Findings: Severe aortic stenosis Normal left ventricular systolic function   Post-operative Echo Findings: No paravalvular leak Normal left ventricular systolic function   _____________     Echo 01/16/22:  IMPRESSIONS  1. Left ventricular ejection fraction, by estimation, is 60 to 65%. The  left ventricle has normal function. The left ventricle has no regional  wall motion abnormalities. There is severe asymmetric left ventricular  hypertrophy of the septal segment. Left   ventricular diastolic parameters are consistent with Grade I diastolic  dysfunction (impaired relaxation).  2. Right ventricular systolic function is normal. The right ventricular  size is normal.   3. The mitral valve is normal in structure. Trivial mitral valve  regurgitation. No evidence of mitral  stenosis.   4. There is a 26 mm Sapien prosthetic (TAVR) valve present in the aortic  position. Procedure Date: 01/16/2022. Effective orifice area, by VTI  measures 2.41 cm. Aortic valve mean gradient measures 14 mmHg. Aortic  valve Vmax measures 2.34 m/s. Peak  gradient 22 mm Hg. DVI 0.51. Trival PVL at the intravalvular fibrosa see  in PLAX view.   5. The inferior vena cava is normal in size with greater than 50%  respiratory variability, suggesting right atrial pressure of 3 mmHg.   Comparison(s): Stable TAVR Placement.   EKG:  EKG is  ordered today.  The ekg ordered today demonstrates sinus with more prolonged 1st deg AV block, LBBB, HR 74  Recent Labs: 01/11/2022: ALT 19 01/16/2022: BUN 25; Creatinine, Ser 1.51; Hemoglobin 14.2; Magnesium 2.1; Platelets 152; Potassium 4.3; Sodium 138  Recent Lipid Panel    Component Value Date/Time   CHOL 157 07/11/2016 0232   TRIG 74 07/11/2016 0232   HDL 45 07/11/2016 0232   CHOLHDL 3.5 07/11/2016 0232   VLDL 15 07/11/2016 0232   LDLCALC 97 07/11/2016 0232     Risk Assessment/Calculations:      Physical Exam:    VS:  BP 114/78   Pulse 73   Ht '5\' 11"'$  (1.803 m)   Wt 298 lb 3.2 oz (135.3 kg)   SpO2 99%   BMI 41.59 kg/m     Wt Readings from Last 3 Encounters:  01/23/22 298 lb 3.2 oz (135.3 kg)  01/16/22 292 lb 15.9 oz (132.9 kg)  12/13/21 285 lb (129.3 kg)     GEN:  Well nourished, well developed in no acute distress, obese HEENT: Normal NECK: No JVD LYMPHATICS: No lymphadenopathy CARDIAC: RRR, no murmurs, rubs, gallops RESPIRATORY:  Clear to auscultation without rales, wheezing or rhonchi  ABDOMEN: Soft, non-tender, non-distended MUSCULOSKELETAL:  No edema; No deformity  SKIN: Warm and dry.  Groin sites clear without hematoma or ecchymosis  NEUROLOGIC:  Alert and oriented x 3 PSYCHIATRIC:  Normal affect   ASSESSMENT:    1. S/P TAVR (transcatheter aortic valve replacement)   2. Stage 3b chronic kidney disease (HCC)    3. Heart failure with improved ejection fraction (HFimpEF) ([place])   4. PAF (paroxysmal atrial fibrillation) ([place])   5. Pancreatic lesion   6. Pulmonary nodules    PLAN:    In order of problems listed above:  Severe AS s/p TAVR: doing well 1 week out from TAVR. ECG with significant prolongation of PR (356m) and old IRBBB but no HAVB. Groin sites are healing well. Continue on Eliquis '5mg'$  BID. SBE prophylaxis discussed; I have RX'd amoxicillin. He will be seen back for 1 month echo and OV   CKD stage IIIb: creat baseline ~2. This has remained stable.   HFimpEF: EF normalized. Continue Coreg, hydralazine/nitrates, and lasix.    PAF: ECG with sinus today. Continue Eliquis    Morbid obesity: Body mass index is 40.86 kg/m. Recently started on Ozempic.    Pancreatic lesion: pre TAVR CTs reported a " tiny enhancing nodule along the anterior aspect of the pancreatic tail, incompletely characterized on this examination primary differential considerations include high attenuation focus of pancreatic parenchyma, intra pancreatic splenule or pancreatic neuroendocrine tumor. Consider more definitive characterization by MRI with and without contrast and assessment  of biochemical profile for functionality." This was printed out for him to bring to his PCP for follow up.   Pulmonary nodules: pre TAVR CTs reported a "small solid pulmonary nodule of the right lower lobe measuring 4 mm. No follow-up needed if patient is low-risk." Lifelong non smoker and no history of malignancy so no further work up recommended.    Medication Adjustments/Labs and Tests Ordered: Current medicines are reviewed at length with the patient today.  Concerns regarding medicines are outlined above.  Orders Placed This Encounter  Procedures   EKG 12-Lead   Meds ordered this encounter  Medications   amoxicillin (AMOXIL) 500 MG tablet    Sig: Take 4 tablets (2,000 mg total) by mouth as directed. 1 hour prior to dental work  including cleanings    Dispense:  12 tablet    Refill:  12    Order Specific Question:   Supervising Provider    Answer:   [place], [facility]    Patient Instructions  Medication Instructions:   Your physician recommends that you continue on your current medications as directed. Please refer to the Current Medication list given to you today.   *If you need a refill on your cardiac medications before your next appointment, please call your pharmacy*   Lab Work:  [place]   If you have labs (blood work) drawn today and your tests are completely normal, you will receive your results only by: [place] (if you have MyChart) OR A paper copy in the mail If you have any lab test that is abnormal or we need to change your treatment, we will call you to review the results.   Testing/Procedures: NONE ORDERED  TODAY    Follow-Up:  At [facility], you and your health needs are our priority.  As part of our continuing mission to provide you with exceptional heart care, we have created designated Provider Care Teams.  These Care Teams include your primary Cardiologist (physician) and Advanced Practice Providers (APPs -  Physician Assistants and Nurse Practitioners) who all work together to provide you with the care you need, when you need it.  We recommend signing up for the patient portal called "MyChart".  Sign up information is provided on this After Visit Summary.  MyChart is used to connect with patients for Virtual Visits (Telemedicine).  Patients are able to view lab/test results, encounter notes, upcoming appointments, etc.  Non-urgent messages can be sent to your provider as well.   To learn more about what you can do with MyChart, go to [email or web address].    Your next appointment:   1 month(s)  The format for your next appointment:   In Person  Provider:   Nell Range, PA-C    Other Instructions   Important Information About  [place]         Signed, [place] Form, PA-C  01/24/2022 10:05 AM    [place]

## 2022-01-23 ENCOUNTER — Ambulatory Visit: Payer: Medicare HMO | Admitting: Physician Assistant

## 2022-01-23 ENCOUNTER — Other Ambulatory Visit: Payer: Self-pay | Admitting: Cardiology

## 2022-01-23 VITALS — BP 114/78 | HR 73 | Ht 71.0 in | Wt 298.2 lb

## 2022-01-23 DIAGNOSIS — I480 Paroxysmal atrial fibrillation: Secondary | ICD-10-CM | POA: Diagnosis not present

## 2022-01-23 DIAGNOSIS — I5022 Chronic systolic (congestive) heart failure: Secondary | ICD-10-CM

## 2022-01-23 DIAGNOSIS — R918 Other nonspecific abnormal finding of lung field: Secondary | ICD-10-CM

## 2022-01-23 DIAGNOSIS — N1832 Chronic kidney disease, stage 3b: Secondary | ICD-10-CM

## 2022-01-23 DIAGNOSIS — Z952 Presence of prosthetic heart valve: Secondary | ICD-10-CM | POA: Diagnosis not present

## 2022-01-23 DIAGNOSIS — I350 Nonrheumatic aortic (valve) stenosis: Secondary | ICD-10-CM

## 2022-01-23 DIAGNOSIS — K869 Disease of pancreas, unspecified: Secondary | ICD-10-CM | POA: Diagnosis not present

## 2022-01-23 DIAGNOSIS — I5020 Unspecified systolic (congestive) heart failure: Secondary | ICD-10-CM

## 2022-01-23 MED ORDER — AMOXICILLIN 500 MG PO TABS: 2000.0000 mg | ORAL_TABLET | ORAL | 12 refills | Status: AC

## 2022-01-23 NOTE — Patient Instructions (Signed)
Medication Instructions:   Your physician recommends that you continue on your current medications as directed. Please refer to the Current Medication list given to you today.   *If you need a refill on your cardiac medications before your next appointment, please call your pharmacy*   Lab Work:  [place]   If you have labs (blood work) drawn today and your tests are completely normal, you will receive your results only by: [place] (if you have MyChart) OR A paper copy in the mail If you have any lab test that is abnormal or we need to change your treatment, we will call you to review the results.   Testing/Procedures: NONE ORDERED  TODAY    Follow-Up:  At [facility], you and your health needs are our priority.  As part of our continuing mission to provide you with exceptional heart care, we have created designated Provider Care Teams.  These Care Teams include your primary Cardiologist (physician) and Advanced Practice Providers (APPs -  Physician Assistants and Nurse Practitioners) who all work together to provide you with the care you need, when you need it.  We recommend signing up for the patient portal called "MyChart".  Sign up information is provided on this After Visit Summary.  MyChart is used to connect with patients for Virtual Visits (Telemedicine).  Patients are able to view lab/test results, encounter notes, upcoming appointments, etc.  Non-urgent messages can be sent to your provider as well.   To learn more about what you can do with MyChart, go to [email or web address].    Your next appointment:   1 month(s)  The format for your next appointment:   In Person  Provider:   Nell Range, PA-C    Other Instructions   Important Information About [place]

## 2022-02-07 ENCOUNTER — Telehealth: Payer: Self-pay

## 2022-02-07 DIAGNOSIS — I480 Paroxysmal atrial fibrillation: Secondary | ICD-10-CM

## 2022-02-07 MED ORDER — APIXABAN 5 MG PO TABS: 5.0000 mg | ORAL_TABLET | Freq: Two times a day (BID) | ORAL | 11 refills | Status: DC

## 2022-02-07 NOTE — Telephone Encounter (Signed)
error 

## 2022-02-07 NOTE — Telephone Encounter (Signed)
Wants Eliquis refilled

## 2022-02-12 ENCOUNTER — Other Ambulatory Visit: Payer: Medicare HMO

## 2022-02-27 ENCOUNTER — Ambulatory Visit: Payer: Medicare HMO

## 2022-02-27 ENCOUNTER — Other Ambulatory Visit (HOSPITAL_COMMUNITY): Payer: Medicare HMO

## 2022-02-27 ENCOUNTER — Ambulatory Visit: Payer: Medicare HMO | Admitting: Cardiology

## 2022-03-05 NOTE — Progress Notes (Unsigned)
HEART AND [place]                                     Cardiology Office Note:    Date:  03/07/2022   ID:  [name], DOB 1955-09-01, MRN [id number]  PCP:  Kathalene Frames, MD  [facility] Cardiologist: Dr. Virgina Jock / Dr Angelena Form & Dr. Tenny Craw (TAVR)  [facility] Electrophysiologist:  None   Referring MD: Kathalene Frames, *   1 month s/p TAVR  History of Present Illness:    Stephen Maldonado is a 67 y.o. male with a hx of CAD, CKD IIIb, HTN, HLD, GERD, prior DVT, NICM with recovery of normal LV function, paroxysmal a-fib on Eliquis, obesity, sleep apnea, and moderate-severe AS s/p TAVR (01/15/22) who presents to clinic for follow up.   He has a history of aortic stenosis that has been followed closely by Dr. Virgina Jock. He recently reported feeling poorly with constant fatigue and dizziness. Echo 09/18/21 showed EF 65%, severe LFLG AS with a mean grad 17.3 mmHg, peak grad 32.2 mmHg, AVA 0.91cm2, DVI 0.24, SVI 25, and mild MR. Cardiac cath 11/13/21 showed severe disease in the mid circumflex and moderate disease in the RCA. No disease in the LAD and severe AS with a mean gradient across the aortic valve of 39.7 mmHg.    The patient was evaluated by the multidisciplinary valve team and underwent successful TAVR with a 26 mm Edwards Sapien 3 Ultra Resilia THV via the TF approach on 01/15/22. Post operative echo showed EF 60%, severe asymmetric LVH, normally functioning TAVR with a mean gradient of 14 mmHg and trivial PVL. He was discharged home on Eliquis '5mg'$  BID.   Today the patient presents to clinic for follow up. He feels like he can get a deeper breath. No CP or SOB. No LE edema, orthopnea or PND. No dizziness or syncope. No blood in stool or urine. No palpitations.     Past Medical History:  Diagnosis Date   Arthritis    knees   Bladder spasms    BPH (benign prostatic hyperplasia)    DVT (deep venous thrombosis)  ([place]) ocyt 2016   left leg tx medically   GERD (gastroesophageal reflux disease)    History of colon polyps    hyperplastic 06-14-2010   Hydronephrosis    Hypertension    Obesity, morbid (HCC)    S/P TAVR (transcatheter aortic valve replacement) 01/15/2022   s/p TAVR with a 26 mm Edwards S3UR via the TF approach by Dr. Angelena Form & Dr. Tenny Craw   Urinary retention     Past Surgical History:  Procedure Laterality Date   CARDIOVERSION N/A 08/14/2018   Procedure: CARDIOVERSION;  Surgeon: Nigel Mormon, MD;  Location: [place] [facility];  Service: Cardiovascular;  Laterality: N/A;   COLONOSCOPY  06/14/2010   CORONARY ANGIOGRAPHY N/A 07/11/2016   Procedure: Coronary Angiography;  Surgeon: Dixie Dials, MD;  Location: [place] [facility];  Service: Cardiovascular;  Laterality: N/A;   CYSTOSCOPY W/ RETROGRADES Bilateral 10/03/2015   Procedure: CYSTOSCOPY WITH RETROGRADE PYELOGRAM;  Surgeon: [name], MD;  Location: [facility];  Service: Urology;  Laterality: Bilateral;   GREEN LIGHT LASER TURP (TRANSURETHRAL RESECTION OF PROSTATE N/A 10/03/2015   Procedure: GREEN LIGHT LASER,  TURP - STAGED(TRANSURETHRAL RESECTION OF PROSTATE;  Surgeon: [name], MD;  Location: [place];  Service: Urology;  Laterality: N/A;   HAND SURGERY Left    INTRAOPERATIVE TRANSTHORACIC ECHOCARDIOGRAM N/A 01/15/2022   Procedure: INTRAOPERATIVE TRANSTHORACIC ECHOCARDIOGRAM;  Surgeon: Burnell Blanks, MD;  Location: [place] [facility];  Service: Open Heart Surgery;  Laterality: N/A;   INTRAVASCULAR PRESSURE WIRE/FFR STUDY N/A 07/14/2018   Procedure: INTRAVASCULAR PRESSURE WIRE/FFR STUDY;  Surgeon: Nigel Mormon, MD;  Location: [place] [facility];  Service: Cardiovascular;  Laterality: N/A;   KNEE ARTHROSCOPY     not sure which knee   LEFT HEART CATH AND CORONARY ANGIOGRAPHY N/A 07/14/2018   Procedure: LEFT HEART CATH AND CORONARY ANGIOGRAPHY;  Surgeon:  Nigel Mormon, MD;  Location: [place] [facility];  Service: Cardiovascular;  Laterality: N/A;   QUADRICEPS TENDON REPAIR Bilateral left 02/24/2003/   right 2006   RIGHT/LEFT HEART CATH AND CORONARY ANGIOGRAPHY N/A 11/13/2021   Procedure: RIGHT/LEFT HEART CATH AND CORONARY ANGIOGRAPHY;  Surgeon: Nigel Mormon, MD;  Location: [place] [facility];  Service: Cardiovascular;  Laterality: N/A;   TOTAL KNEE ARTHROPLASTY Right 12/01/2015   Procedure: RIGHT TOTAL KNEE ARTHROPLASTY;  Surgeon: [name], MD;  Location: [facility];  Service: Orthopedics;  Laterality: Right;   TOTAL KNEE ARTHROPLASTY Left 01/10/2017   Procedure: LEFT TOTAL KNEE ARTHROPLASTY;  Surgeon: [name], MD;  Location: [facility];  Service: Orthopedics;  Laterality: Left;   TOTAL SHOULDER ARTHROPLASTY Right 01/23/2018   Procedure: RIGHT REVERSE TOTAL SHOULDER ARTHROPLASTY;  Surgeon: Meredith Pel, MD;  Location: [place];  Service: Orthopedics;  Laterality: Right;   TRANSCATHETER AORTIC VALVE REPLACEMENT, TRANSFEMORAL N/A 01/15/2022   Procedure: Transcatheter Aortic Valve Replacement, Transfemoral;  Surgeon: Burnell Blanks, MD;  Location: [place] [facility];  Service: Open Heart Surgery;  Laterality: N/A;    Current Medications: Current Meds  Medication Sig   allopurinol (ZYLOPRIM) 100 MG tablet Take 200 mg by mouth every morning.   amoxicillin (AMOXIL) 500 MG tablet Take 4 tablets (2,000 mg total) by mouth as directed. 1 hour prior to dental work including cleanings   apixaban (ELIQUIS) 5 MG TABS tablet Take 1 tablet (5 mg total) by mouth 2 (two) times daily. Resume on 10/11 morning   carvedilol (COREG) 12.5 MG tablet Take 1 tablet (12.5 mg total) by mouth 2 (two) times daily with a meal.   Cholecalciferol (VITAMIN D) 50 MCG (2000 UT) tablet Take 2,000 Units by mouth daily.   Coenzyme Q10 (COQ-10) 200 MG CAPS Take 200 mg by mouth daily.   furosemide (LASIX) 80 MG tablet Take 1 tablet (80  mg total) by mouth 2 (two) times daily.   Garlic 1610 MG CAPS Take 1,000 mg by mouth daily.   hydrALAZINE (APRESOLINE) 25 MG tablet Take 25 mg by mouth 2 (two) times daily with a meal.   isosorbide mononitrate (IMDUR) 30 MG 24 hr tablet Take 1 tablet (30 mg total) by mouth daily.   Lecithin 400 MG CAPS Take 1 capsule by mouth daily.   metFORMIN (GLUCOPHAGE) 500 MG tablet Take 500 mg by mouth daily.   naphazoline-pheniramine (ALLERGY EYE) 0.025-0.3 % ophthalmic solution Place 1 drop into both eyes 4 (four) times daily as needed for eye irritation.   Omega-3 Fatty Acids (FISH OIL) 1000 MG CAPS Take 1,000 mg by mouth daily.    OZEMPIC, 0.25 OR 0.5 MG/DOSE, 2 MG/3ML SOPN Inject into the skin.   Potassium Chloride ER 20 MEQ TBCR Take 20 mEq by mouth 2 (two) times daily. with food  tamsulosin (FLOMAX) 0.4 MG CAPS capsule Take 0.4 mg by mouth every morning.   Turmeric 500 MG TABS Take 500 mg by mouth daily.   vitamin B-12 (CYANOCOBALAMIN) 1000 MCG tablet Take 1,000 mcg by mouth daily.   vitamin C (ASCORBIC ACID) 500 MG tablet Take 500 mg by mouth daily.      Allergies:   Atorvastatin, Aspirin, Simvastatin, and Statins   Social History   Socioeconomic History   Marital status: Married    Spouse name: Not on file   Number of children: 1   Years of education: Not on file   Highest education level: Not on file  Occupational History   Occupation: Journalist, newspaper for office equipment  Tobacco Use   Smoking status: Never   Smokeless tobacco: Former    Types: Chew    Quit date: 02/05/1968  Vaping Use   Vaping Use: Never used  Substance and Sexual Activity   Alcohol use: Yes    Comment: 1-2 beers per month   Drug use: No   Sexual activity: Not Currently  Other Topics Concern   Not on file  Social History Narrative   Not on file   Social Determinants of Health   Financial Resource Strain: Not on file  Food Insecurity: No Food Insecurity (01/16/2022)   Hunger Vital Sign     Worried About Running Out of Food in the Last Year: Never true    [place] in the Last Year: Never true  Transportation Needs: No Transportation Needs (01/16/2022)   PRAPARE - Hydrologist (Medical): No    Lack of Transportation (Non-Medical): No  Physical Activity: Not on file  Stress: Not on file  Social Connections: Not on file     Family History: The patient's family history includes Breast cancer in his mother; CAD in his father; Diabetes in his father and mother; Heart failure in his father; Lung cancer in his mother.  ROS:   Please see the history of present illness.    All other systems reviewed and are negative.  EKGs/Labs/Other Studies Reviewed:    The following studies were reviewed today:  [facility]  TAVR OPERATIVE NOTE     Date of Procedure:                01/15/2022   Preoperative Diagnosis:      Severe Aortic Stenosis    Postoperative Diagnosis:    Same    Procedure:        Transcatheter Aortic Valve Replacement - Transfemoral Approach             Edwards Sapien 3 THV (size 26 mm, model # [id number]  serial # [id number])              Co-Surgeons:                        Lauree Chandler, MD and Justice Rocher, MD    Anesthesiologist:                  Nyoka Cowden   Echocardiographer:              Gasper Sells   Pre-operative Echo Findings: Severe aortic stenosis Normal left ventricular systolic function   Post-operative Echo Findings: No paravalvular leak Normal left ventricular systolic function   _____________     Echo 01/16/22:  IMPRESSIONS  1. Left ventricular ejection fraction, by estimation, is 60 to 65%.  The  left ventricle has normal function. The left ventricle has no regional  wall motion abnormalities. There is severe asymmetric left ventricular  hypertrophy of the septal segment. Left   ventricular diastolic parameters are consistent with Grade I diastolic  dysfunction (impaired  relaxation).   2. Right ventricular systolic function is normal. The right ventricular  size is normal.   3. The mitral valve is normal in structure. Trivial mitral valve  regurgitation. No evidence of mitral stenosis.   4. There is a 26 mm Sapien prosthetic (TAVR) valve present in the aortic  position. Procedure Date: 01/16/2022. Effective orifice area, by VTI  measures 2.41 cm. Aortic valve mean gradient measures 14 mmHg. Aortic  valve Vmax measures 2.34 m/s. Peak  gradient 22 mm Hg. DVI 0.51. Trival PVL at the intravalvular fibrosa see  in PLAX view.   5. The inferior vena cava is normal in size with greater than 50%  respiratory variability, suggesting right atrial pressure of 3 mmHg.   Comparison(s): Stable TAVR Placement.   ____________________  Echo 03/06/22 IMPRESSIONS  1. Left ventricular ejection fraction, by estimation, is 55 to 60%. The  left ventricle has normal function. The left ventricle has no regional  wall motion abnormalities. There is mild left ventricular hypertrophy.  Left ventricular diastolic parameters  are consistent with Grade I diastolic dysfunction (impaired relaxation).   2. Right ventricular systolic function is normal. The right ventricular  size is mildly enlarged.   3. The mitral valve is normal in structure. Trivial mitral valve  regurgitation. No evidence of mitral stenosis.   4. The aortic valve is normal in structure. Aortic valve regurgitation is  mild. No aortic stenosis is present. Procedure Date: 01/15/2022. Echo  findings are consistent with mild perivalvular leak of the aortic  prosthesis.   5. The inferior vena cava is normal in size with greater than 50%  respiratory variability, suggesting right atrial pressure of 3 mmHg.   Comparison(s): No significant change from prior study. Prior images  reviewed side by side.    EKG:  EKG is NOT ordered today.   Recent Labs: 01/11/2022: ALT 19 01/16/2022: BUN 25; Creatinine, Ser 1.51;  Hemoglobin 14.2; Magnesium 2.1; Platelets 152; Potassium 4.3; Sodium 138  Recent Lipid Panel    Component Value Date/Time   CHOL 157 07/11/2016 0232   TRIG 74 07/11/2016 0232   HDL 45 07/11/2016 0232   CHOLHDL 3.5 07/11/2016 0232   VLDL 15 07/11/2016 0232   LDLCALC 97 07/11/2016 0232     Risk Assessment/Calculations:      Physical Exam:    VS:  BP 98/78 (BP Location: Left Arm, Patient Position: Sitting, Cuff Size: Large)   Pulse 87   Ht '5\' 11"'$  (1.803 m)   Wt 295 lb (133.8 kg)   SpO2 97%   BMI 41.14 kg/m     Wt Readings from Last 3 Encounters:  03/06/22 295 lb (133.8 kg)  01/23/22 298 lb 3.2 oz (135.3 kg)  01/16/22 292 lb 15.9 oz (132.9 kg)     GEN:  Well nourished, well developed in no acute distress, obese HEENT: Normal NECK: No JVD LYMPHATICS: No lymphadenopathy CARDIAC: RRR, no murmurs, rubs, gallops RESPIRATORY:  Clear to auscultation without rales, wheezing or rhonchi  ABDOMEN: Soft, non-tender, non-distended MUSCULOSKELETAL:  No edema; No deformity  SKIN: Warm and dry.  NEUROLOGIC:  Alert and oriented x 3 PSYCHIATRIC:  Normal affect   ASSESSMENT:    1. S/P TAVR (transcatheter aortic valve replacement)  2. Stage 3b chronic kidney disease (HCC)   3. Heart failure with improved ejection fraction (HFimpEF) ([place])   4. PAF (paroxysmal atrial fibrillation) ([place])   5. Morbid obesity ([place])   6. Pancreatic lesion   7. Pulmonary nodules     PLAN:    In order of problems listed above:  Severe AS s/p TAVR: echo today shows EF 55%, normally functioning TAVR with a mean gradient of 11.5 mm hg and mild PVL. He has NYHA class I symptoms. Continue on Eliquis '5mg'$  BID. SBE prophylaxis discussed; he has amoxicillin. He will be seen back for 1 year echo and OV. Has an apt with Dr. Virgina Jock in June.    CKD stage IIIb: creat baseline ~2. This has remained stable.   HFimpEF: EF normalized. Continue Coreg, hydralazine/nitrates, and lasix.    PAF: ECG with sinus today.  Continue Eliquis    Morbid obesity: Body mass index is 40.86 kg/m. Recently started on Ozempic.    Pancreatic lesion: pre TAVR CTs reported a " tiny enhancing nodule along the anterior aspect of the pancreatic tail, incompletely characterized on this examination primary differential considerations include high attenuation focus of pancreatic parenchyma, intra pancreatic splenule or pancreatic neuroendocrine tumor. Consider more definitive characterization by MRI with and without contrast and assessment of biochemical profile for functionality." This was printed out for him to bring to his PCP for follow up.   Pulmonary nodules: pre TAVR CTs reported a "small solid pulmonary nodule of the right lower lobe measuring 4 mm. No follow-up needed if patient is low-risk." Lifelong non smoker and no history of malignancy so no further work up recommended.    Medication Adjustments/Labs and Tests Ordered: Current medicines are reviewed at length with the patient today.  Concerns regarding medicines are outlined above.  No orders of the defined types were placed in this encounter.  No orders of the defined types were placed in this encounter.   Patient Instructions  Medication Instructions:   Your physician recommends that you continue on your current medications as directed. Please refer to the Current Medication list given to you today.   *If you need a refill on your cardiac medications before your next appointment, please call your pharmacy*   Lab Work:  None ordered.   If you have labs (blood work) drawn today and your tests are completely normal, you will receive your results only by: [place] (if you have MyChart) OR A paper copy in the mail If you have any lab test that is abnormal or we need to change your treatment, we will call you to review the results.   Testing/Procedures:  None ordered.   Follow-Up: At [facility], you and your health needs are our  priority.  As part of our continuing mission to provide you with exceptional heart care, we have created designated Provider Care Teams.  These Care Teams include your primary Cardiologist (physician) and Advanced Practice Providers (APPs -  Physician Assistants and Nurse Practitioners) who all work together to provide you with the care you need, when you need it.  We recommend signing up for the patient portal called "MyChart".  Sign up information is provided on this After Visit Summary.  MyChart is used to connect with patients for Virtual Visits (Telemedicine).  Patients are able to view lab/test results, encounter notes, upcoming appointments, etc.  Non-urgent messages can be sent to your provider as well.   To learn more about what you can do with MyChart, go  to [email or web address].    Your next appointment:   11 month(s)  Provider:   Kathyrn Drown, NP or Nell Range, PA-C       Signed, Angelena Form, PA-C  03/07/2022 10:19 AM    [facility]

## 2022-03-06 ENCOUNTER — Ambulatory Visit (HOSPITAL_BASED_OUTPATIENT_CLINIC_OR_DEPARTMENT_OTHER): Payer: Medicare HMO

## 2022-03-06 ENCOUNTER — Ambulatory Visit (HOSPITAL_COMMUNITY): Payer: Medicare HMO | Attending: Cardiology | Admitting: Physician Assistant

## 2022-03-06 ENCOUNTER — Other Ambulatory Visit (HOSPITAL_COMMUNITY): Payer: Self-pay | Admitting: Physician Assistant

## 2022-03-06 VITALS — BP 98/78 | HR 87 | Ht 71.0 in | Wt 295.0 lb

## 2022-03-06 DIAGNOSIS — I350 Nonrheumatic aortic (valve) stenosis: Secondary | ICD-10-CM

## 2022-03-06 DIAGNOSIS — K869 Disease of pancreas, unspecified: Secondary | ICD-10-CM | POA: Diagnosis not present

## 2022-03-06 DIAGNOSIS — Z952 Presence of prosthetic heart valve: Secondary | ICD-10-CM | POA: Diagnosis not present

## 2022-03-06 DIAGNOSIS — I5022 Chronic systolic (congestive) heart failure: Secondary | ICD-10-CM | POA: Diagnosis not present

## 2022-03-06 DIAGNOSIS — I480 Paroxysmal atrial fibrillation: Secondary | ICD-10-CM

## 2022-03-06 DIAGNOSIS — N1832 Chronic kidney disease, stage 3b: Secondary | ICD-10-CM

## 2022-03-06 DIAGNOSIS — I5020 Unspecified systolic (congestive) heart failure: Secondary | ICD-10-CM

## 2022-03-06 DIAGNOSIS — R918 Other nonspecific abnormal finding of lung field: Secondary | ICD-10-CM | POA: Diagnosis not present

## 2022-03-06 NOTE — Patient Instructions (Signed)
Medication Instructions:   Your physician recommends that you continue on your current medications as directed. Please refer to the Current Medication list given to you today.   *If you need a refill on your cardiac medications before your next appointment, please call your pharmacy*   Lab Work:  None ordered.   If you have labs (blood work) drawn today and your tests are completely normal, you will receive your results only by: [place] (if you have MyChart) OR A paper copy in the mail If you have any lab test that is abnormal or we need to change your treatment, we will call you to review the results.   Testing/Procedures:  None ordered.   Follow-Up: At [facility], you and your health needs are our priority.  As part of our continuing mission to provide you with exceptional heart care, we have created designated Provider Care Teams.  These Care Teams include your primary Cardiologist (physician) and Advanced Practice Providers (APPs -  Physician Assistants and Nurse Practitioners) who all work together to provide you with the care you need, when you need it.  We recommend signing up for the patient portal called "MyChart".  Sign up information is provided on this After Visit Summary.  MyChart is used to connect with patients for Virtual Visits (Telemedicine).  Patients are able to view lab/test results, encounter notes, upcoming appointments, etc.  Non-urgent messages can be sent to your provider as well.   To learn more about what you can do with MyChart, go to [email or web address].    Your next appointment:   11 month(s)  Provider:   Kathyrn Drown, NP or Nell Range, PA-C

## 2022-03-07 LAB — ECHOCARDIOGRAM COMPLETE
AR max vel: 2.66 cm2
AV Area VTI: 2.81 cm2
AV Area mean vel: 2.66 cm2
AV Mean grad: 11.5 mmHg
AV Peak grad: 22.6 mmHg
Ao pk vel: 2.38 m/s
Area-P 1/2: 3.08 cm2
Calc EF: 56.7 %
S' Lateral: 3.5 cm
Single Plane A2C EF: 59.5 %
Single Plane A4C EF: 55.3 %

## 2022-06-13 ENCOUNTER — Ambulatory Visit: Payer: Medicare HMO | Admitting: Cardiology

## 2022-06-13 ENCOUNTER — Encounter: Payer: Self-pay | Admitting: Cardiology

## 2022-06-13 VITALS — BP 127/83 | HR 73 | Resp 16 | Ht 71.0 in | Wt 302.0 lb

## 2022-06-13 DIAGNOSIS — Z952 Presence of prosthetic heart valve: Secondary | ICD-10-CM | POA: Diagnosis not present

## 2022-06-13 DIAGNOSIS — I480 Paroxysmal atrial fibrillation: Secondary | ICD-10-CM

## 2022-06-13 DIAGNOSIS — I25118 Atherosclerotic heart disease of native coronary artery with other forms of angina pectoris: Secondary | ICD-10-CM | POA: Diagnosis not present

## 2022-06-13 DIAGNOSIS — R0609 Other forms of dyspnea: Secondary | ICD-10-CM

## 2022-06-13 DIAGNOSIS — I2510 Atherosclerotic heart disease of native coronary artery without angina pectoris: Secondary | ICD-10-CM | POA: Insufficient documentation

## 2022-06-13 MED ORDER — APIXABAN 5 MG PO TABS: 5.0000 mg | ORAL_TABLET | Freq: Two times a day (BID) | ORAL | 3 refills | Status: DC

## 2022-06-13 NOTE — Progress Notes (Signed)
Follow up visit  Subjective:   Stephen Maldonado, male    DOB: 1955-08-27, 67 y.o.   MRN: [id number]   Chief Complaint  Patient presents with   Nonrheumatic aortic valve stenosis   Follow-up    67 y.o. Caucasian male with hypertension, hyperlipidemia, moderate CAD, h/o NICM with recovered LVEF, PAF, moderate obesity, OSA, now s/p TAVR for severe AS  Patient had some improvement with his dyspnea since TAVR but does not think had complete resolution. Last couple weeks, he has had felt tired and dizzy with doing work outside on his car etc. He has had some chest tightness, but not with exertion. It is usually with deep breathing. He has had a lot of mucus secretions. He has known seasonal allergies, is not currently taking any medications for the same.     Current Outpatient Medications:    allopurinol (ZYLOPRIM) 100 MG tablet, Take 200 mg by mouth every morning., Disp: , Rfl:    amoxicillin (AMOXIL) 500 MG tablet, Take 4 tablets (2,000 mg total) by mouth as directed. 1 hour prior to dental work including cleanings, Disp: 12 tablet, Rfl: 12   apixaban (ELIQUIS) 5 MG TABS tablet, Take 1 tablet (5 mg total) by mouth 2 (two) times daily. Resume on 10/11 morning, Disp: 30 tablet, Rfl: 11   carvedilol (COREG) 12.5 MG tablet, Take 1 tablet (12.5 mg total) by mouth 2 (two) times daily with a meal., Disp: 90 tablet, Rfl: 3   carvedilol (COREG) 6.25 MG tablet, Take 6.25 mg by mouth 2 (two) times daily with a meal., Disp: , Rfl:    Cholecalciferol (VITAMIN D) 50 MCG (2000 UT) tablet, Take 2,000 Units by mouth daily., Disp: , Rfl:    Coenzyme Q10 (COQ-10) 200 MG CAPS, Take 200 mg by mouth daily., Disp: , Rfl:    cyclobenzaprine (FLEXERIL) 10 MG tablet, Take 10 mg by mouth 3 (three) times daily as needed for muscle spasms., Disp: , Rfl:    diltiazem (TIAZAC) 240 MG 24 hr capsule, Take 240 mg by mouth daily., Disp: , Rfl:    Docosahexaenoic Acid (DHA OMEGA 3 PO), Take by mouth., Disp: , Rfl:     furosemide (LASIX) 80 MG tablet, Take 1 tablet (80 mg total) by mouth 2 (two) times daily., Disp: 90 tablet, Rfl: 2   Garlic 1000 MG CAPS, Take 1,000 mg by mouth daily., Disp: , Rfl:    hydrALAZINE (APRESOLINE) 25 MG tablet, Take 25 mg by mouth 2 (two) times daily with a meal., Disp: , Rfl: 3   isosorbide mononitrate (IMDUR) 30 MG 24 hr tablet, Take 1 tablet (30 mg total) by mouth daily., Disp: 90 tablet, Rfl: 3   Lecithin 400 MG CAPS, Take 1 capsule by mouth daily., Disp: , Rfl:    losartan (COZAAR) 25 MG tablet, Take 25 mg by mouth daily., Disp: , Rfl:    metFORMIN (GLUCOPHAGE) 500 MG tablet, Take 500 mg by mouth daily., Disp: , Rfl:    naphazoline-pheniramine (ALLERGY EYE) 0.025-0.3 % ophthalmic solution, Place 1 drop into both eyes 4 (four) times daily as needed for eye irritation., Disp: , Rfl:    Omega-3 Fatty Acids (FISH OIL) 1000 MG CAPS, Take 1,000 mg by mouth daily. , Disp: , Rfl:    Potassium Chloride ER 20 MEQ TBCR, Take 20 mEq by mouth 2 (two) times daily. with food, Disp: , Rfl:    tamsulosin (FLOMAX) 0.4 MG CAPS capsule, Take 0.4 mg by mouth every morning., Disp: , Rfl:  Turmeric 500 MG TABS, Take 500 mg by mouth daily., Disp: , Rfl:    vitamin B-12 (CYANOCOBALAMIN) 1000 MCG tablet, Take 1,000 mcg by mouth daily., Disp: , Rfl:    vitamin C (ASCORBIC ACID) 500 MG tablet, Take 500 mg by mouth daily. , Disp: , Rfl:     Cardiovascular studies:  Echocardiogram 03/06/2022: 1. Left ventricular ejection fraction, by estimation, is 55 to 60%. The  left ventricle has normal function. The left ventricle has no regional  wall motion abnormalities. There is mild left ventricular hypertrophy.  Left ventricular diastolic parameters  are consistent with Grade I diastolic dysfunction (impaired relaxation).   2. Right ventricular systolic function is normal. The right ventricular  size is mildly enlarged.   3. The mitral valve is normal in structure. Trivial mitral valve  regurgitation. No  evidence of mitral stenosis.   4. The aortic valve is normal in structure. Aortic valve regurgitation is  mild. No aortic stenosis is present. Procedure Date: 01/15/2022. Echo  findings are consistent with mild perivalvular leak of the aortic  prosthesis.   5. The inferior vena cava is normal in size with greater than 50%  respiratory variability, suggesting right atrial pressure of 3 mmHg.   Comparison(s): No significant change from prior study. Prior images  reviewed side by side.   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  Will treat CAD medically at this time.  Unsure he will be a good candidate for SAVR + CABG with his obesity and renal dysfunction. Will refer for TAVR evaluation, with consideration for PCI post TAVR, if necessary.  EKG 10/10/2021: Sinus rhythm 74 bpm First degree A-V block   Echocardiogram 09/18/2021:  Normal LV systolic function with visual EF 60-65%. Left ventricle cavity  is normal in size. Moderate concentric hypertrophy of the left ventricle.  Normal global wall motion. Doppler evidence of grade I (impaired)  diastolic dysfunction, normal LAP. Calculated EF 64%.  Trileaflet aortic valve with no regurgitation. Severe aortic valve  leaflet  thickening with moderate calcification. Moderate to severe aortic valve  stenosis. AVA (VTI) measures 1.6 cm^2. AV Mean Grad measures 34.8 mmHg. AV  Pk Vel measures 3.91 m/s.  Structurally normal tricuspid valve.  Mild tricuspid regurgitation. No  evidence of pulmonary hypertension.   Left/right heart cath 2020: LM: Normal LAD: No significant disease LCx: Mid LCx focal 60% stenosis. dFR 0.97 (Non-significant) RCA: Prox 30%, mid 50% focal stenoses. LVEDP mildly elevated.   Impression: Nonobstructive CAD Nonischemic cardiomyopathy  Recent labs: 01/15/2022: Glucose 149, BUN/Cr 25/1.51. EGFR 50. Na/K 138/4.3.  H/H 14/41. MCV 86. Platelets 152  08/20/2021: Glucose 105, BUN/Cr 36/1.98. EGFR 36. Na/K 141/3.9. Rest of the CMP normal H/H 14/44. MCV 85. Platelets 211 HbA1C 6.6% Chol  165, TG 166, HDL 37, LDL 99 TSH 3.1 normal   Review of Systems  Constitutional: Negative for malaise/fatigue.  Cardiovascular:  Positive for leg swelling (Mild, stable). Negative for chest pain and dyspnea on exertion.  Respiratory:  Positive for snoring. Negative for cough and shortness of breath.   Neurological:  Positive for headaches.  All other systems reviewed and are negative.       Vitals:   06/13/22 1233  BP: 127/83  Pulse: 73  Resp: 16  SpO2: 96%     Objective:   Physical Exam Vitals and nursing note reviewed.  Constitutional:      General: He is not in acute distress.    Appearance: He is obese.  Neck:     Vascular: No JVD.  Cardiovascular:     Rate and Rhythm: Normal rate and regular rhythm.     Heart sounds: Normal heart sounds. No murmur heard. Pulmonary:     Effort: Pulmonary effort is normal.     Breath sounds: Normal breath sounds. No wheezing or rales.  Musculoskeletal:     Right lower leg: Edema (1+) present.     Left lower leg: Edema (1+) present.  Skin:    Comments: Sin abrasions on shin          Assessment & Recommendations:   67 y.o.  Caucasian male with hypertension, hyperlipidemia, moderate CAD, h/o NICM with recovered LVEF, PAF, moderate obesity, OSA, now s/p TAVR for severe AS  Aortic stenosis: S/p TAVR Edwards Sapien 3 THV (size 26 mm) (01/2022). Valve functioning well based on physical exam and echocardiogram 02/2022. Current fatigue, mucus, chest tightness/dyspnea symptoms possibly more due to seasonal allergies.  Known moderate CAD, but symptoms not clear of angina.   Chronic systolic heart failure [facility]): Nonischemic cardiomyopathy with recovered EF. Appears euvolemic. Given recent dyspnea symptoms, will check proBNP.  Paroxysmal Afib: Now maintaining sinus rhythm s/p cardioversion 08/14/2018. CHA2DS2VASc score 3, annual stroke risk 3% Continue eliquis 5 mg bid.   Essential hypertension: Controlled.  OSA on CPAP: Tolerating well  F/u in 4 weeks    Elder Negus, MD Pager: [phone] Office: [phone]

## 2022-07-08 DIAGNOSIS — M533 Sacrococcygeal disorders, not elsewhere classified: Secondary | ICD-10-CM | POA: Diagnosis not present

## 2022-07-08 DIAGNOSIS — M47816 Spondylosis without myelopathy or radiculopathy, lumbar region: Secondary | ICD-10-CM | POA: Diagnosis not present

## 2022-07-08 DIAGNOSIS — M5417 Radiculopathy, lumbosacral region: Secondary | ICD-10-CM | POA: Diagnosis not present

## 2022-07-17 DIAGNOSIS — R0609 Other forms of dyspnea: Secondary | ICD-10-CM | POA: Diagnosis not present

## 2022-07-18 ENCOUNTER — Ambulatory Visit: Payer: Medicare HMO | Admitting: Cardiology

## 2022-07-18 ENCOUNTER — Encounter: Payer: Self-pay | Admitting: Cardiology

## 2022-07-18 VITALS — BP 154/74 | HR 87 | Resp 12 | Ht 71.0 in | Wt 302.4 lb

## 2022-07-18 DIAGNOSIS — I25118 Atherosclerotic heart disease of native coronary artery with other forms of angina pectoris: Secondary | ICD-10-CM | POA: Diagnosis not present

## 2022-07-18 DIAGNOSIS — R0609 Other forms of dyspnea: Secondary | ICD-10-CM | POA: Diagnosis not present

## 2022-07-18 DIAGNOSIS — I480 Paroxysmal atrial fibrillation: Secondary | ICD-10-CM | POA: Diagnosis not present

## 2022-07-18 DIAGNOSIS — Z952 Presence of prosthetic heart valve: Secondary | ICD-10-CM

## 2022-07-18 LAB — PRO B NATRIURETIC PEPTIDE: NT-Pro BNP: 159 pg/mL (ref 0–376)

## 2022-07-18 NOTE — Progress Notes (Signed)
Follow up visit  Subjective:   [name], male    DOB: [birth year], 67 y.o.   MRN: [id number]   Chief Complaint  Patient presents with   s/p TAVR   Coronary artery disease involving native coronary artery of   Chronic systolic heart failure    Follow-up    3 months    67 y.o. Caucasian male with hypertension, hyperlipidemia, moderate CAD, h/o NICM with recovered LVEF, PAF, moderate obesity, OSA, now s/p TAVR for severe AS  Patient continues to have exertional dyspnea, with surprisingly no significant improvement since TAVR in 01/2022. He denies any chest pain. His other ongoing severe complaint is back pain, for which he is contemplating undergoing spinal injection.  Reviewed recent test results with the patient, details below.   Blood pressure elevated today, but generally well controlled.     Current Outpatient Medications:    allopurinol (ZYLOPRIM) 100 MG tablet, Take 200 mg by mouth every morning., Disp: , Rfl:    carvedilol (COREG) 12.5 MG tablet, Take 1 tablet (12.5 mg total) by mouth 2 (two) times daily with a meal., Disp: 90 tablet, Rfl: 3   Cholecalciferol (VITAMIN D) 50 MCG (2000 UT) tablet, Take 2,000 Units by mouth daily., Disp: , Rfl:    Coenzyme Q10 (COQ-10) 200 MG CAPS, Take 200 mg by mouth daily., Disp: , Rfl:    cyclobenzaprine (FLEXERIL) 10 MG tablet, Take 10 mg by mouth 3 (three) times daily as needed for muscle spasms., Disp: , Rfl:    Docosahexaenoic Acid (DHA OMEGA 3 PO), Take by mouth., Disp: , Rfl:    furosemide (LASIX) 80 MG tablet, Take 1 tablet (80 mg total) by mouth 2 (two) times daily., Disp: 90 tablet, Rfl: 2   Garlic 1000 MG CAPS, Take 1,000 mg by mouth daily., Disp: , Rfl:    hydrALAZINE (APRESOLINE) 25 MG tablet, Take 25 mg by mouth 2 (two) times daily with a meal., Disp: , Rfl: 3   isosorbide mononitrate (IMDUR) 30 MG 24 hr tablet, Take 1 tablet (30 mg total) by mouth daily., Disp: 90 tablet, Rfl: 3   Lecithin 400 MG CAPS, Take 1 capsule  by mouth daily., Disp: , Rfl:    metFORMIN (GLUCOPHAGE) 500 MG tablet, Take 500 mg by mouth daily., Disp: , Rfl:    Omega-3 Fatty Acids (FISH OIL) 1000 MG CAPS, Take 1,000 mg by mouth daily. , Disp: , Rfl:    Potassium Chloride ER 20 MEQ TBCR, Take 20 mEq by mouth 2 (two) times daily. with food, Disp: , Rfl:    tamsulosin (FLOMAX) 0.4 MG CAPS capsule, Take 0.4 mg by mouth every morning., Disp: , Rfl:    Turmeric 500 MG TABS, Take 500 mg by mouth daily., Disp: , Rfl:    vitamin B-12 (CYANOCOBALAMIN) 1000 MCG tablet, Take 1,000 mcg by mouth daily., Disp: , Rfl:    vitamin C (ASCORBIC ACID) 500 MG tablet, Take 500 mg by mouth daily. , Disp: , Rfl:    amoxicillin (AMOXIL) 500 MG tablet, Take 4 tablets (2,000 mg total) by mouth as directed. 1 hour prior to dental work including cleanings (Patient not taking: Reported on 07/18/2022), Disp: 12 tablet, Rfl: 12   apixaban (ELIQUIS) 5 MG TABS tablet, Take 1 tablet (5 mg total) by mouth 2 (two) times daily. Resume on 10/11 morning (Patient not taking: Reported on 07/18/2022), Disp: 180 tablet, Rfl: 3   diltiazem (TIAZAC) 240 MG 24 hr capsule, Take 240 mg by mouth daily., Disp: ,  Rfl:    naphazoline-pheniramine (ALLERGY EYE) 0.025-0.3 % ophthalmic solution, Place 1 drop into both eyes 4 (four) times daily as needed for eye irritation. (Patient not taking: Reported on 07/18/2022), Disp: , Rfl:     Cardiovascular studies:  Echocardiogram 03/06/2022: 1. Left ventricular ejection fraction, by estimation, is 55 to 60%. The  left ventricle has normal function. The left ventricle has no regional  wall motion abnormalities. There is mild left ventricular hypertrophy.  Left ventricular diastolic parameters  are consistent with Grade I diastolic dysfunction (impaired relaxation).   2. Right ventricular systolic function is normal. The right ventricular  size is mildly enlarged.   3. The mitral valve is normal in structure. Trivial mitral valve  regurgitation. No  evidence of mitral stenosis.   4. The aortic valve is normal in structure. Aortic valve regurgitation is  mild. No aortic stenosis is present. Procedure Date: 01/15/2022. Echo  findings are consistent with mild perivalvular leak of the aortic  prosthesis.   5. The inferior vena cava is normal in size with greater than 50%  respiratory variability, suggesting right atrial pressure of 3 mmHg.   Comparison(s): No significant change from prior study. Prior images  reviewed side by side.   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  Will treat CAD medically at this time.  Unsure he will be a good candidate for SAVR + CABG with his obesity and renal dysfunction. Will refer for TAVR evaluation, with consideration for PCI post TAVR, if necessary.  EKG 10/10/2021: Sinus rhythm 74 bpm First degree A-V block   Echocardiogram 09/18/2021:  Normal LV systolic function with visual EF 60-65%. Left ventricle cavity  is normal in size. Moderate concentric hypertrophy of the left ventricle.  Normal global wall motion. Doppler evidence of grade I (impaired)  diastolic dysfunction, normal LAP. Calculated EF 64%.  Trileaflet aortic valve with no regurgitation. Severe aortic valve  leaflet  thickening with moderate calcification. Moderate to severe aortic valve  stenosis. AVA (VTI) measures 1.6 cm^2. AV Mean Grad measures 34.8 mmHg. AV  Pk Vel measures 3.91 m/s.  Structurally normal tricuspid valve.  Mild tricuspid regurgitation. No  evidence of pulmonary hypertension.   Left/right heart cath 2020: LM: Normal LAD: No significant disease LCx: Mid LCx focal 60% stenosis. dFR 0.97 (Non-significant) RCA: Prox 30%, mid 50% focal stenoses. LVEDP mildly elevated.   Impression: Nonobstructive CAD Nonischemic cardiomyopathy  Recent labs: 07/17/2022: NTPRoBNP 159 normal  01/15/2022: Glucose 149, BUN/Cr 25/1.51. EGFR 50. Na/K 138/4.3.  H/H 14/41. MCV 86. Platelets 152  08/20/2021: Glucose 105, BUN/Cr 36/1.98. EGFR 36. Na/K 141/3.9. Rest of the CMP normal H/H 14/44. MCV 85. Platelets 211 HbA1C 6.6% Chol 165, TG 166, HDL 37, LDL 99 TSH 3.1 normal  Review of Systems  Constitutional: Negative for malaise/fatigue.  Cardiovascular:  Positive for leg swelling (Mild, stable). Negative for chest pain and dyspnea on exertion.  Respiratory:  Positive for snoring. Negative for cough and shortness of breath.   Neurological:  Positive for headaches.  All other systems reviewed and are negative.       Vitals:   07/18/22 1445  BP: (!) 154/74  Pulse: 87  Resp: 12  SpO2: 96%     Objective:   Physical Exam Vitals and nursing note reviewed.  Constitutional:      General: He is not in acute distress.    Appearance: He is obese.  Neck:     Vascular: No JVD.  Cardiovascular:     Rate and Rhythm: Normal rate and regular rhythm.     Heart sounds: Normal heart sounds. No murmur heard. Pulmonary:     Effort: Pulmonary effort is normal.     Breath sounds: Normal breath sounds. No wheezing or rales.  Musculoskeletal:     Right lower leg: No edema.     Left lower leg: No edema.  Skin:    Comments:            Assessment & Recommendations:   67 y.o.  Caucasian male with hypertension, hyperlipidemia, moderate CAD, h/o NICM with recovered LVEF, PAF, moderate obesity, OSA, now s/p TAVR for severe AS  Aortic stenosis: S/p TAVR Edwards Sapien 3 THV (size 26 mm) (01/2022). Valve functioning well based on physical exam and echocardiogram 02/2022.  Exertional dyspnea: Symptoms have persistent in spite of TAVR. EF is normal, proBNP is normal. This raises the question if his dyspnea is angina equivalent from moderate CAD in mid Lcx and prox RCA, I will obtain Lexiscan nuclear stress test to evaluate for ischemia. Conitnue current ant anginal therapy. If percunateous intervention considered in future, will need to time it to allow for his spinal injection for his back pain.  Paroxysmal Afib: Now maintaining sinus rhythm s/p cardioversion 08/14/2018. CHA2DS2VASc score 3, annual stroke risk 3% Continue eliquis 5 mg bid.  He had been off Eliquis due to insurance reasons, samples given and refill prescription sent.   Essential hypertension: Controlled.  OSA on CPAP: Tolerating well  F/u in 4-6 weeks    Elder Negus, MD Pager: [phone] Office: [phone]

## 2022-07-19 ENCOUNTER — Encounter: Payer: Self-pay | Admitting: Cardiology

## 2022-07-19 MED ORDER — APIXABAN 5 MG PO TABS: 5.0000 mg | ORAL_TABLET | Freq: Two times a day (BID) | ORAL | 3 refills | Status: DC

## 2022-07-29 ENCOUNTER — Ambulatory Visit: Payer: Medicare HMO

## 2022-07-29 DIAGNOSIS — R0609 Other forms of dyspnea: Secondary | ICD-10-CM | POA: Diagnosis not present

## 2022-08-01 ENCOUNTER — Ambulatory Visit: Payer: Medicare HMO | Admitting: Cardiology

## 2022-08-01 NOTE — Progress Notes (Signed)
Stress test does show abnormality compatible with coronary narrowings. Please set up f/u appt to discuss coronary stenting options. He had mentioned previously that he would like his wife to accompany him for this visit.  Thanks MJP

## 2022-08-01 NOTE — Progress Notes (Signed)
Next mutually suitable for me, him, and his wife.  Thanks MJP

## 2022-08-22 DIAGNOSIS — M48061 Spinal stenosis, lumbar region without neurogenic claudication: Secondary | ICD-10-CM | POA: Diagnosis not present

## 2022-08-22 DIAGNOSIS — M5186 Other intervertebral disc disorders, lumbar region: Secondary | ICD-10-CM | POA: Diagnosis not present

## 2022-08-22 DIAGNOSIS — M47817 Spondylosis without myelopathy or radiculopathy, lumbosacral region: Secondary | ICD-10-CM | POA: Diagnosis not present

## 2022-08-22 DIAGNOSIS — M25552 Pain in left hip: Secondary | ICD-10-CM | POA: Diagnosis not present

## 2022-08-22 DIAGNOSIS — M1612 Unilateral primary osteoarthritis, left hip: Secondary | ICD-10-CM | POA: Diagnosis not present

## 2022-08-22 DIAGNOSIS — M160 Bilateral primary osteoarthritis of hip: Secondary | ICD-10-CM | POA: Diagnosis not present

## 2022-08-22 DIAGNOSIS — M5137 Other intervertebral disc degeneration, lumbosacral region: Secondary | ICD-10-CM | POA: Diagnosis not present

## 2022-08-22 DIAGNOSIS — M5136 Other intervertebral disc degeneration, lumbar region: Secondary | ICD-10-CM | POA: Diagnosis not present

## 2022-08-22 DIAGNOSIS — M25551 Pain in right hip: Secondary | ICD-10-CM | POA: Diagnosis not present

## 2022-08-22 DIAGNOSIS — M47816 Spondylosis without myelopathy or radiculopathy, lumbar region: Secondary | ICD-10-CM | POA: Diagnosis not present

## 2022-10-02 DIAGNOSIS — R102 Pelvic and perineal pain: Secondary | ICD-10-CM | POA: Diagnosis not present

## 2022-10-02 DIAGNOSIS — M533 Sacrococcygeal disorders, not elsewhere classified: Secondary | ICD-10-CM | POA: Diagnosis not present

## 2022-10-02 DIAGNOSIS — M5417 Radiculopathy, lumbosacral region: Secondary | ICD-10-CM | POA: Diagnosis not present

## 2022-10-11 ENCOUNTER — Ambulatory Visit: Payer: Medicare HMO | Admitting: Cardiology

## 2022-10-11 ENCOUNTER — Other Ambulatory Visit: Payer: Self-pay | Admitting: Cardiology

## 2022-10-11 ENCOUNTER — Encounter: Payer: Self-pay | Admitting: Cardiology

## 2022-10-11 VITALS — BP 144/79 | HR 74 | Resp 16 | Ht 71.0 in | Wt 308.0 lb

## 2022-10-11 DIAGNOSIS — I25118 Atherosclerotic heart disease of native coronary artery with other forms of angina pectoris: Secondary | ICD-10-CM

## 2022-10-11 MED ORDER — SEMAGLUTIDE-WEIGHT MANAGEMENT 0.5 MG/0.5ML ~~LOC~~ SOAJ: 0.5000 mg | SUBCUTANEOUS | 3 refills | Status: DC

## 2022-10-11 MED ORDER — SEMAGLUTIDE-WEIGHT MANAGEMENT 2.4 MG/0.75ML ~~LOC~~ SOAJ: 2.4000 mg | SUBCUTANEOUS | 3 refills | Status: DC

## 2022-10-11 MED ORDER — SEMAGLUTIDE-WEIGHT MANAGEMENT 1.7 MG/0.75ML ~~LOC~~ SOAJ: 1.7000 mg | SUBCUTANEOUS | 3 refills | Status: DC

## 2022-10-11 MED ORDER — SEMAGLUTIDE-WEIGHT MANAGEMENT 1 MG/0.5ML ~~LOC~~ SOAJ: 1.0000 mg | SUBCUTANEOUS | 3 refills | Status: DC

## 2022-10-11 MED ORDER — SEMAGLUTIDE-WEIGHT MANAGEMENT 0.25 MG/0.5ML ~~LOC~~ SOAJ: 0.2500 mg | SUBCUTANEOUS | 0 refills | Status: AC

## 2022-10-11 NOTE — Progress Notes (Signed)
Follow up visit  Subjective:   Stephen Maldonado, male    DOB: April 22, 1955, 67 y.o.   MRN: [id number]   Chief Complaint  Patient presents with   Nonrheumatic aortic valve stenosis    67 y.o. Caucasian male with hypertension, hyperlipidemia, moderate CAD, h/o NICM with recovered LVEF, PAF, moderate obesity, OSA, now s/p TAVR for severe AS  Patient is here with his wife today.  Patient continues to have exertional dyspnea.  He denies any severe leg edema, orthopnea, PND symptoms.   Reviewed recent test results with the patient, details below.     Current Outpatient Medications:    allopurinol (ZYLOPRIM) 100 MG tablet, Take 200 mg by mouth every morning., Disp: , Rfl:    amoxicillin (AMOXIL) 500 MG tablet, Take 4 tablets (2,000 mg total) by mouth as directed. 1 hour prior to dental work including cleanings (Patient not taking: Reported on 07/18/2022), Disp: 12 tablet, Rfl: 12   apixaban (ELIQUIS) 5 MG TABS tablet, Take 1 tablet (5 mg total) by mouth 2 (two) times daily. Resume on 10/11 morning, Disp: 180 tablet, Rfl: 3   carvedilol (COREG) 12.5 MG tablet, Take 1 tablet (12.5 mg total) by mouth 2 (two) times daily with a meal., Disp: 90 tablet, Rfl: 3   Cholecalciferol (VITAMIN D) 50 MCG (2000 UT) tablet, Take 2,000 Units by mouth daily., Disp: , Rfl:    Coenzyme Q10 (COQ-10) 200 MG CAPS, Take 200 mg by mouth daily., Disp: , Rfl:    cyclobenzaprine (FLEXERIL) 10 MG tablet, Take 10 mg by mouth 3 (three) times daily as needed for muscle spasms., Disp: , Rfl:    diltiazem (TIAZAC) 240 MG 24 hr capsule, Take 240 mg by mouth daily., Disp: , Rfl:    Docosahexaenoic Acid (DHA OMEGA 3 PO), Take by mouth., Disp: , Rfl:    furosemide (LASIX) 80 MG tablet, Take 1 tablet (80 mg total) by mouth 2 (two) times daily., Disp: 90 tablet, Rfl: 2   Garlic 1000 MG CAPS, Take 1,000 mg by mouth daily., Disp: , Rfl:    hydrALAZINE (APRESOLINE) 25 MG tablet, Take 25 mg by mouth 2 (two) times daily with a meal.,  Disp: , Rfl: 3   isosorbide mononitrate (IMDUR) 30 MG 24 hr tablet, Take 1 tablet (30 mg total) by mouth daily., Disp: 90 tablet, Rfl: 3   Lecithin 400 MG CAPS, Take 1 capsule by mouth daily., Disp: , Rfl:    metFORMIN (GLUCOPHAGE) 500 MG tablet, Take 500 mg by mouth daily., Disp: , Rfl:    naphazoline-pheniramine (ALLERGY EYE) 0.025-0.3 % ophthalmic solution, Place 1 drop into both eyes 4 (four) times daily as needed for eye irritation. (Patient not taking: Reported on 07/18/2022), Disp: , Rfl:    Omega-3 Fatty Acids (FISH OIL) 1000 MG CAPS, Take 1,000 mg by mouth daily. , Disp: , Rfl:    Potassium Chloride ER 20 MEQ TBCR, Take 20 mEq by mouth 2 (two) times daily. with food, Disp: , Rfl:    tamsulosin (FLOMAX) 0.4 MG CAPS capsule, Take 0.4 mg by mouth every morning., Disp: , Rfl:    Turmeric 500 MG TABS, Take 500 mg by mouth daily., Disp: , Rfl:    vitamin B-12 (CYANOCOBALAMIN) 1000 MCG tablet, Take 1,000 mcg by mouth daily., Disp: , Rfl:    vitamin C (ASCORBIC ACID) 500 MG tablet, Take 500 mg by mouth daily. , Disp: , Rfl:     Cardiovascular studies:  Regadenoson Nuclear stress test 07/29/2022: There is  a moderate sized fixed defect in the inferior and apical regions.  LV is mildly dilated both in rest and stress. Overall LV systolic function is abnormal with inferior hypokinesis. Stress LV EF: 40%.  RV is mildly dilated.  Nondiagnostic ECG stress due to pharmacological stress.  No previous exam available for comparison. Intermediate risk study due to low LVEF.   Echocardiogram 03/06/2022: 1. Left ventricular ejection fraction, by estimation, is 55 to 60%. The  left ventricle has normal function. The left ventricle has no regional  wall motion abnormalities. There is mild left ventricular hypertrophy.  Left ventricular diastolic parameters  are consistent with Grade I diastolic dysfunction (impaired relaxation).   2. Right ventricular systolic function is normal. The right ventricular   size is mildly enlarged.   3. The mitral valve is normal in structure. Trivial mitral valve  regurgitation. No evidence of mitral stenosis.   4. The aortic valve is normal in structure. Aortic valve regurgitation is  mild. No aortic stenosis is present. Procedure Date: 01/15/2022. Echo  findings are consistent with mild perivalvular leak of the aortic  prosthesis.   5. The inferior vena cava is normal in size with greater than 50%  respiratory variability, suggesting right atrial pressure of 3 mmHg.   Comparison(s): No significant change from prior study. Prior images  reviewed side by side.   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  Will treat CAD medically at this time.  Unsure he will be a good candidate for SAVR + CABG with his obesity and renal dysfunction. Will refer for TAVR evaluation, with consideration for PCI post TAVR, if necessary.  EKG 10/10/2021: Sinus rhythm 74 bpm First degree A-V block   Echocardiogram 09/18/2021:  Normal LV systolic function with visual EF 60-65%. Left ventricle cavity  is normal in size. Moderate concentric hypertrophy of the left ventricle.  Normal global wall motion. Doppler evidence of grade I (impaired)  diastolic  dysfunction, normal LAP. Calculated EF 64%.  Trileaflet aortic valve with no regurgitation. Severe aortic valve leaflet  thickening with moderate calcification. Moderate to severe aortic valve  stenosis. AVA (VTI) measures 1.6 cm^2. AV Mean Grad measures 34.8 mmHg. AV  Pk Vel measures 3.91 m/s.  Structurally normal tricuspid valve.  Mild tricuspid regurgitation. No  evidence of pulmonary hypertension.   Left/right heart cath 2020: LM: Normal LAD: No significant disease LCx: Mid LCx focal 60% stenosis. dFR 0.97 (Non-significant) RCA: Prox 30%, mid 50% focal stenoses. LVEDP mildly elevated.   Impression: Nonobstructive CAD Nonischemic cardiomyopathy  Recent labs: 07/17/2022: NTPRoBNP 159 normal  01/15/2022: Glucose 149, BUN/Cr 25/1.51. EGFR 50. Na/K 138/4.3.  H/H 14/41. MCV 86. Platelets 152  08/20/2021: Glucose 105, BUN/Cr 36/1.98. EGFR 36. Na/K 141/3.9. Rest of the CMP  normal H/H 14/44. MCV 85. Platelets 211 HbA1C 6.6% Chol 165, TG 166, HDL 37, LDL 99 TSH 3.1 normal   Review of Systems  Constitutional: Negative for malaise/fatigue.  Cardiovascular:  Positive for dyspnea on exertion. Negative for chest pain and leg swelling.  Respiratory:  Positive for snoring. Negative for cough and shortness of breath.   Neurological:  Positive for headaches.  All other systems reviewed and are negative.       Vitals:   10/11/22 1421  BP: (!) 144/79  Pulse: 74  Resp: 16      Objective:   Physical Exam Vitals and nursing note reviewed.  Constitutional:      General: He is not in acute distress.    Appearance: He is obese.  Neck:     Vascular: No JVD.  Cardiovascular:     Rate and Rhythm: Normal rate and regular rhythm.     Heart sounds: Normal heart sounds. No murmur heard. Pulmonary:     Effort: Pulmonary effort is normal.     Breath sounds: Normal breath sounds. No wheezing or rales.  Musculoskeletal:     Right lower leg: No edema.     Left lower leg: No edema.   Skin:    Comments:           Assessment & Recommendations:   67 y.o. Caucasian male with hypertension, hyperlipidemia, moderate CAD, h/o NICM with recovered LVEF, PAF, moderate obesity, OSA, now s/p TAVR for severe AS  Aortic stenosis: S/p TAVR Edwards Sapien 3 THV (size 26 mm) (01/2022). Valve functioning well based on physical exam and echocardiogram 02/2022.  Exertional dyspnea: NYHA class II. Symptoms have persistent in spite of TAVR. EF is normal, proBNP is normal. This raises the question if his dyspnea is angina equivalent from moderate CAD in mid Lcx and prox RCA.  Stress test showed seemingly fixed moderate-sized infarct in LCx/RCA territory.  In absence of prior MI, clinically less likely that he has scar in the territory.  However, his exertional dyspnea could also be stemming from his obesity.  Given the confounding factor of obesity, we mutually decided to focus on weight loss first.  In addition to diet and lifestyle modifications, I will start him on Wegovy.  Given presence of coronary artery disease, I am optimistic that this will be approved by insurance.  In fact, he had taken [facility] in the past and had lost close to 60 pounds with that.  If his symptoms of dyspnea persist in spite of weight loss, we will then consider repeat heart catheterization and PCI to left circumflex +/- RCA. Continue medical therapy with current antianginal agents.  Paroxysmal Afib: Now maintaining sinus rhythm s/p cardioversion 08/14/2018. CHA2DS2VASc score 3, annual stroke risk 3% Continue eliquis 5 mg bid.   Essential hypertension: Controlled.  OSA on CPAP: Tolerating well  F/u in 3 months      Elder Negus, MD Pager: [phone] Office: [phone]

## 2022-10-14 ENCOUNTER — Other Ambulatory Visit: Payer: Self-pay | Admitting: Cardiology

## 2022-10-14 DIAGNOSIS — I25118 Atherosclerotic heart disease of native coronary artery with other forms of angina pectoris: Secondary | ICD-10-CM

## 2022-10-16 ENCOUNTER — Other Ambulatory Visit: Payer: Self-pay | Admitting: Cardiology

## 2022-10-16 DIAGNOSIS — Z0000 Encounter for general adult medical examination without abnormal findings: Secondary | ICD-10-CM | POA: Diagnosis not present

## 2022-10-16 DIAGNOSIS — K869 Disease of pancreas, unspecified: Secondary | ICD-10-CM | POA: Diagnosis not present

## 2022-10-16 DIAGNOSIS — I4891 Unspecified atrial fibrillation: Secondary | ICD-10-CM | POA: Diagnosis not present

## 2022-10-16 DIAGNOSIS — D6869 Other thrombophilia: Secondary | ICD-10-CM | POA: Diagnosis not present

## 2022-10-16 DIAGNOSIS — I509 Heart failure, unspecified: Secondary | ICD-10-CM | POA: Diagnosis not present

## 2022-10-16 DIAGNOSIS — Z6841 Body Mass Index (BMI) 40.0 and over, adult: Secondary | ICD-10-CM | POA: Diagnosis not present

## 2022-10-16 DIAGNOSIS — Z79899 Other long term (current) drug therapy: Secondary | ICD-10-CM | POA: Diagnosis not present

## 2022-10-16 DIAGNOSIS — N1832 Chronic kidney disease, stage 3b: Secondary | ICD-10-CM | POA: Diagnosis not present

## 2022-10-16 DIAGNOSIS — Z125 Encounter for screening for malignant neoplasm of prostate: Secondary | ICD-10-CM | POA: Diagnosis not present

## 2022-10-16 DIAGNOSIS — I25118 Atherosclerotic heart disease of native coronary artery with other forms of angina pectoris: Secondary | ICD-10-CM

## 2022-10-16 DIAGNOSIS — E1159 Type 2 diabetes mellitus with other circulatory complications: Secondary | ICD-10-CM | POA: Diagnosis not present

## 2022-10-16 DIAGNOSIS — I10 Essential (primary) hypertension: Secondary | ICD-10-CM | POA: Diagnosis not present

## 2022-10-16 DIAGNOSIS — I2510 Atherosclerotic heart disease of native coronary artery without angina pectoris: Secondary | ICD-10-CM | POA: Diagnosis not present

## 2022-10-16 DIAGNOSIS — Z1331 Encounter for screening for depression: Secondary | ICD-10-CM | POA: Diagnosis not present

## 2022-10-16 DIAGNOSIS — I700 Atherosclerosis of aorta: Secondary | ICD-10-CM | POA: Diagnosis not present

## 2022-10-17 ENCOUNTER — Other Ambulatory Visit: Payer: Self-pay | Admitting: Cardiology

## 2022-10-17 DIAGNOSIS — I25118 Atherosclerotic heart disease of native coronary artery with other forms of angina pectoris: Secondary | ICD-10-CM

## 2022-10-18 ENCOUNTER — Other Ambulatory Visit: Payer: Self-pay | Admitting: Internal Medicine

## 2022-10-18 DIAGNOSIS — K869 Disease of pancreas, unspecified: Secondary | ICD-10-CM

## 2022-10-23 ENCOUNTER — Other Ambulatory Visit: Payer: Self-pay | Admitting: Cardiology

## 2022-10-23 DIAGNOSIS — E782 Mixed hyperlipidemia: Secondary | ICD-10-CM | POA: Diagnosis not present

## 2022-10-23 DIAGNOSIS — I25118 Atherosclerotic heart disease of native coronary artery with other forms of angina pectoris: Secondary | ICD-10-CM

## 2022-10-31 DIAGNOSIS — M533 Sacrococcygeal disorders, not elsewhere classified: Secondary | ICD-10-CM | POA: Diagnosis not present

## 2022-11-06 ENCOUNTER — Ambulatory Visit: Payer: Medicare HMO | Admitting: Cardiology

## 2022-11-28 ENCOUNTER — Ambulatory Visit
Admission: RE | Admit: 2022-11-28 | Discharge: 2022-11-28 | Disposition: A | Discharge status: Home / Self Care | Payer: Medicare HMO | Source: Ambulatory Visit | Attending: Internal Medicine | Admitting: Internal Medicine

## 2022-11-28 DIAGNOSIS — K862 Cyst of pancreas: Secondary | ICD-10-CM | POA: Diagnosis not present

## 2022-11-28 DIAGNOSIS — K760 Fatty (change of) liver, not elsewhere classified: Secondary | ICD-10-CM | POA: Diagnosis not present

## 2022-11-28 DIAGNOSIS — K8020 Calculus of gallbladder without cholecystitis without obstruction: Secondary | ICD-10-CM | POA: Diagnosis not present

## 2022-11-28 DIAGNOSIS — K869 Disease of pancreas, unspecified: Secondary | ICD-10-CM

## 2022-11-28 MED ORDER — GADOPICLENOL 0.5 MMOL/ML IV SOLN
10.0000 mL | Freq: Once | INTRAVENOUS | Status: AC | PRN
  Administered 2022-11-28: 10 mL via INTRAVENOUS

## 2023-01-13 NOTE — Progress Notes (Unsigned)
[facility]   MULTIDISCIPLINARY HEART VALVE CLINIC                                     Cardiology Office Note:    Date:  01/13/2023   ID:  Stephen Maldonado, DOB 05/01/1955, MRN [id number]  PCP:  Emilio Aspen, MD  [facility] Cardiologist:  Elder Negus, MD / Dr Clifton James & Dr. Delia Chimes (TAVR)  [facility] Electrophysiologist:  None   Referring MD: Emilio Aspen, *   1 year s/p TAVR  History of Present Illness:    Stephen Maldonado is a 67 y.o. male with a hx of CAD, CKD IIIb, HTN, HLD, GERD, prior DVT, NICM with recovery of normal LV function, paroxysmal a-fib on Eliquis, obesity, sleep apnea, and moderate-severe AS s/p TAVR (01/15/22) who presents to clinic for follow up.    Echo 09/18/21 showed EF 65%, severe LFLG AS with a mean grad 17.3 mmHg, peak grad 32.2 mmHg, AVA 0.91cm2, DVI 0.24, SVI 25, and mild MR. Cardiac cath 11/13/21 showed severe disease in the mid circumflex and moderate disease in the RCA. No disease in the LAD and severe AS with a mean gradient across the aortic valve of 39.7 mmHg.    S/p TAVR with a 26 mm Edwards Sapien 3 Ultra Resilia THV via the TF approach on 01/15/22. Post operative echo showed EF 60%, severe asymmetric LVH, normally functioning TAVR with a mean gradient of 14 mmHg and trivial PVL. He was discharged home on Eliquis 5mg  BID. 1 month echo 03/06/22 showed EF 55%, normally functioning TAVR with a mean gradient of 11.5 mm hg and mild PVL  Seen by Dr. Rosemary Holms in 10/2022 and complained of persistent dyspnea. Plans were made to attempt weight loss first and started on Wegovy. If dyspnea didn't improve with weight loss, cardiac cath would be pursued.    Today the patient presents to clinic for follow up.  Past Medical History:  Diagnosis Date   Arthritis    knees   Bladder spasms    BPH (benign prostatic hyperplasia)    DVT (deep venous thrombosis) (HCC) ocyt 2016   left leg tx medically   GERD (gastroesophageal  reflux disease)    History of colon polyps    hyperplastic 06-14-2010   Hydronephrosis    Hypertension    Obesity, morbid (HCC)    S/P TAVR (transcatheter aortic valve replacement) 01/15/2022   s/p TAVR with a 26 mm Edwards S3UR via the TF approach by Dr. Clifton James & Dr. Delia Chimes   Urinary retention      Current Medications: No outpatient medications have been marked as taking for the 01/15/23 encounter (Appointment) with [facility].      ROS:   Please see the history of present illness.    All other systems reviewed and are negative.  EKGs       Risk Assessment/Calculations:   {Does this patient have ATRIAL FIBRILLATION?[phone]}   {This patient may be at risk for Amyloid. He has one or more dx on the problem list or PMH from the following list - Abnormal EKG, CHF, Aortic Stenosis, Proteinuria, LVH, Carpal Tunnel Syndrome, Biceps Tendon Rupture, Syncope. See list below or review PMH.  Diagnoses From Problem List           Noted     Chronic systolic heart failure (HCC) 07/06/2018  Severe aortic stenosis 10/10/2021    Click HERE to open Cardiac Amyloid Screening SmartSet to order screening OR Click HERE to defer testing for 1 year or permanently :1}    Physical Exam:    VS:  There were no vitals taken for this visit.    Wt Readings from Last 3 Encounters:  10/11/22 (!) 308 lb (139.7 kg)  07/18/22 (!) 302 lb 6.4 oz (137.2 kg)  06/13/22 (!) 302 lb (137 kg)     GEN: Well nourished, well developed in no acute distress NECK: No JVD CARDIAC: ***RRR, no murmurs, rubs, gallops RESPIRATORY:  Clear to auscultation without rales, wheezing or rhonchi  ABDOMEN: Soft, non-tender, non-distended EXTREMITIES:  No edema; No deformity.  Groin sites clear without hematoma or ecchymosis. ****  ASSESSMENT:    1. S/P TAVR (transcatheter aortic valve replacement)   2. Stage 3b chronic kidney disease (HCC)   3. Heart failure with improved ejection fraction  (HFimpEF) (HCC)   4. PAF (paroxysmal atrial fibrillation) (HCC)   5. Morbid obesity (HCC)   6. Pancreatic lesion   7. Pulmonary nodules     PLAN:    In order of problems listed above:  Severe AS s/p TAVR: echo today shows EF 55%, normally functioning TAVR with a mean gradient of 11.5 mm hg and mild PVL. He has NYHA class I symptoms. Continue on Eliquis 5mg  BID. SBE prophylaxis discussed; he has amoxicillin. He will be seen back for 1 year echo and OV. Has an apt with Dr. Rosemary Holms in June.    CKD stage IIIb: creat baseline ~2. This has remained stable.   HFimpEF: EF normalized. Continue Coreg, hydralazine/nitrates, and lasix.    PAF: ECG with sinus today. Continue Eliquis    Morbid obesity: Body mass index is 40.86 kg/m. Recently started on Ozempic.    Pancreatic lesion: pre TAVR CTs reported a " tiny enhancing nodule along the anterior aspect of the pancreatic tail, incompletely characterized on this examination primary differential considerations include high attenuation focus of pancreatic parenchyma, intra pancreatic splenule or pancreatic neuroendocrine tumor. Consider more definitive characterization by MRI with and without contrast and assessment of biochemical profile for functionality." This was printed out for him to bring to his PCP for follow up.    Pulmonary nodules: pre TAVR CTs reported a "small solid pulmonary nodule of the right lower lobe measuring 4 mm. No follow-up needed if patient is low-risk." Lifelong non smoker and no history of malignancy so no further work up recommended.  {Are you ordering a CV Procedure (e.g. stress test, cath, DCCV, TEE, etc)?   Press F2        :[id number]}    I spent *** minutes caring for this patient today including face-to-face discussions, reviewing labs, reviewing records from [facility] and other outside facilities, documenting in the record, and arranging for follow up.     Medication Adjustments/Labs and Tests Ordered: Current  medicines are reviewed at length with the patient today.  Concerns regarding medicines are outlined above.  No orders of the defined types were placed in this encounter.  No orders of the defined types were placed in this encounter.   There are no Patient Instructions on file for this visit.   Signed, Cline Crock, PA-C  01/13/2023 1:15 PM    [facility]

## 2023-01-14 ENCOUNTER — Other Ambulatory Visit: Payer: Self-pay | Admitting: Physician Assistant

## 2023-01-14 DIAGNOSIS — Z952 Presence of prosthetic heart valve: Secondary | ICD-10-CM

## 2023-01-15 ENCOUNTER — Ambulatory Visit (HOSPITAL_COMMUNITY): Payer: Medicare HMO

## 2023-01-15 ENCOUNTER — Ambulatory Visit: Payer: Medicare HMO

## 2023-01-15 DIAGNOSIS — I5032 Chronic diastolic (congestive) heart failure: Secondary | ICD-10-CM

## 2023-01-15 DIAGNOSIS — Z952 Presence of prosthetic heart valve: Secondary | ICD-10-CM

## 2023-01-15 DIAGNOSIS — I480 Paroxysmal atrial fibrillation: Secondary | ICD-10-CM

## 2023-01-15 DIAGNOSIS — R918 Other nonspecific abnormal finding of lung field: Secondary | ICD-10-CM

## 2023-01-15 DIAGNOSIS — K869 Disease of pancreas, unspecified: Secondary | ICD-10-CM

## 2023-01-15 DIAGNOSIS — N1832 Chronic kidney disease, stage 3b: Secondary | ICD-10-CM

## 2023-01-16 ENCOUNTER — Encounter: Payer: Self-pay | Admitting: Cardiology

## 2023-01-16 ENCOUNTER — Ambulatory Visit: Payer: Medicare HMO | Attending: Cardiology | Admitting: Cardiology

## 2023-01-16 DIAGNOSIS — I480 Paroxysmal atrial fibrillation: Secondary | ICD-10-CM | POA: Diagnosis not present

## 2023-01-16 DIAGNOSIS — I25118 Atherosclerotic heart disease of native coronary artery with other forms of angina pectoris: Secondary | ICD-10-CM

## 2023-01-16 NOTE — Progress Notes (Signed)
Cardiology Office Note:  .   Date:  01/16/2023  ID:  Stephen Maldonado, DOB Jul 18, 1955, MRN [id number] PCP: Emilio Aspen, MD  [facility] Providers Cardiologist:  Truett Mainland, MD PCP: Emilio Aspen, MD  Chief Complaint  Patient presents with   Exertional dyspnea      History of Present Illness: .    Stephen Maldonado is a 67 y.o. male with hypertension, hyperlipidemia, moderate CAD, h/o NICM with recovered LVEF, PAF, moderate obesity, OSA, now s/p TAVR for severe AS   Patient denies any chest pain.  Exertional dyspnea is slightly improved.  He is now able to walk from mailbox and back without having to stop.  He has lost 5 pounds.  Reginal Lutes was started, but then denied by insurance.  Vitals:   01/16/23 1544  BP: 136/72  Pulse: 82  SpO2: 93%     ROS:  Review of Systems  Cardiovascular:  Positive for dyspnea on exertion (NYHA class II). Negative for chest pain, leg swelling, palpitations and syncope.     Studies Reviewed: Marland Kitchen        No new studies reviewed today  Risk Assessment/Calculations:    CHA2DS2-VASc Score = 3   This indicates a  3.2% annual risk of stroke. The patient's score is based upon:          Physical Exam:   Physical Exam Vitals and nursing note reviewed.  Constitutional:      General: He is not in acute distress. Neck:     Vascular: No JVD.  Cardiovascular:     Rate and Rhythm: Normal rate and regular rhythm.     Heart sounds: Murmur heard.     Harsh midsystolic murmur is present with a grade of 2/6 at the upper right sternal border radiating to the neck.  Pulmonary:     Effort: Pulmonary effort is normal.     Breath sounds: Normal breath sounds. No wheezing or rales.  Musculoskeletal:     Right lower leg: No edema.     Left lower leg: No edema.      VISIT DIAGNOSES:   ICD-10-CM   1. Morbid obesity (HCC)  E66.01 AMB Referral to [facility] Pharm[facility]    2. Coronary artery disease involving native coronary  artery of native heart with other form of angina pectoris (HCC)  I25.118 AMB Referral to [facility] Pharm-D    3. PAF (paroxysmal atrial fibrillation) (HCC)  I48.0        ASSESSMENT AND PLAN: .    Stephen Maldonado is a 67 y.o. male with hypertension, hyperlipidemia, moderate CAD, h/o NICM with recovered LVEF, PAF, moderate obesity, OSA, now s/p TAVR for severe AS   Aortic stenosis: S/p TAVR Edwards Sapien 3 THV (size 26 mm) (01/2022). Valve functioning well based on physical exam and echocardiogram 02/2022. Repeat echocardiogram on 01/22/2023. NYHA class II symptoms at this time.   Exertional dyspnea: NYHA class II. Symptoms have persistent in spite of TAVR. EF is normal, proBNP is normal. This raises the question if his dyspnea is angina equivalent from moderate CAD in mid Lcx and prox RCA.  Stress test showed seemingly fixed moderate-sized infarct in LCx/RCA territory.  In absence of prior MI, clinically less likely that he has scar in the territory.  However, his exertional dyspnea could also be stemming from his obesity.  Given the confounding factor of obesity, he would like to continue weight loss efforts.  We will reapply for [facility] approval.  Given his  CAD, and obesity I am optimistic that Reginal Lutes will be approved. If his symptoms of dyspnea persist in spite of weight loss, we will then consider repeat heart catheterization and PCI to left circumflex +/- RCA. Continue medical therapy with current antianginal agents.   Paroxysmal Afib: Now maintaining sinus rhythm s/p cardioversion 08/14/2018. CHA2DS2VASc score 3, annual stroke risk 3% Continue eliquis 5 mg bid.    Essential hypertension: Controlled.   OSA on CPAP: Tolerating well  F/u in 3 months  Signed, Elder Negus, MD

## 2023-01-16 NOTE — Patient Instructions (Signed)
Medication Instructions:   Your physician recommends that you continue on your current medications as directed. Please refer to the Current Medication list given to you today.  *If you need a refill on your cardiac medications before your next appointment, please call your pharmacy*   You have been referred to PHARMACIST HERE IN THE OFFICE FOR WEGOVY    Testing/Procedures:  YOUR ECHO IS SCHEDULED AT [place] [facility] ON 01/22/23 AT 3 PM    Follow-Up:  3 MONTHS WITH DR. PATWARDHAN

## 2023-01-17 ENCOUNTER — Encounter: Payer: Self-pay | Admitting: Cardiology

## 2023-01-20 ENCOUNTER — Ambulatory Visit: Payer: Self-pay | Admitting: Cardiology

## 2023-01-22 ENCOUNTER — Ambulatory Visit (HOSPITAL_COMMUNITY)
Admission: RE | Admit: 2023-01-22 | Discharge: 2023-01-22 | Disposition: A | Discharge status: Home / Self Care | Payer: Medicare HMO | Source: Ambulatory Visit | Attending: Internal Medicine | Admitting: Internal Medicine

## 2023-01-22 DIAGNOSIS — Z952 Presence of prosthetic heart valve: Secondary | ICD-10-CM | POA: Diagnosis not present

## 2023-01-22 LAB — ECHOCARDIOGRAM COMPLETE
AV Mean grad: 11 mm[Hg]
AV Peak grad: 18.5 mm[Hg]
Ao pk vel: 2.15 m/s
Area-P 1/2: 2.66 cm2
S' Lateral: 3.5 cm

## 2023-01-22 NOTE — Progress Notes (Signed)
*  PRELIMINARY RESULTS* Echocardiogram 2D Echocardiogram has been performed. Patient did Not want Definity at this time.  Stacey Drain 01/22/2023, 4:05 PM

## 2023-01-24 ENCOUNTER — Telehealth: Payer: Self-pay | Admitting: Cardiology

## 2023-01-24 NOTE — Telephone Encounter (Signed)
The patient has been notified of the result and verbalized understanding.  All questions (if any) were answered. Loa Socks, LPN 16/11/9602 5:40 AM

## 2023-01-24 NOTE — Telephone Encounter (Signed)
Patient returning call for results. Please advise  

## 2023-01-24 NOTE — Telephone Encounter (Signed)
-----   Message from Cline Crock sent at 01/23/2023  2:12 PM EST ----- Can you send a letter to this pt telling him his echo is normal? I tried calling and he doesn't answer and no VM set up. Thanks!

## 2023-02-25 ENCOUNTER — Telehealth: Payer: Self-pay | Admitting: Pharmacist

## 2023-02-25 NOTE — Progress Notes (Signed)
Patient ID: Stephen Maldonado                 DOB: 11-08-1955                    MRN: [id number]     HPI: Stephen Maldonado is a 68 y.o. male patient referred to pharmacy clinic by Dr.Patwardhan to initiate GLP1-RA therapy. PMH is significant for  hypertension, hyperlipidemia, pre-diabetes, moderate CAD, h/o NICM with recovered LVEF, PAF, moderate obesity, OSA, now s/p TAVR for severe AS. Most recent BMI 42.54 and weight 305 lbs.  Baseline weight and BMI: 42.54 and 305 lbs Current weight and BMI: 42.54 and 305 lbs Current meds that affect weight: none   Patient said he was on Wegovy last year and he had lost almost 40 lbs. Insurance has stopped covering so went off of the therapy and his weight is increasing back. When he was 40 lbs lighter her joints was not hurting that bad and  he felt great. He had tolerated Wegovy well with out side effects. He eats healthy home cooked meals but he has large appetite and Wegovy was helping to curb that. Due to chronic pain unable to exercise.    Diet: eats what ever he likes to eat but mainly healthy   Exercise: none   Family History:   Relation Problem Comments  Mother (Deceased at age 29) Breast cancer   Diabetes   Lung cancer     Father (Deceased at age 18) CAD   Diabetes   Heart failure     Sister Metallurgist)   Brother Metallurgist)     Social History:  Alcohol: 15  drinks per month  Smoking: none  Recreational - marijuana once every 3 months    Labs: No results found for: "HGBA1C"  Wt Readings from Last 1 Encounters:  01/16/23 (!) 301 lb (136.5 kg)    BP Readings from Last 1 Encounters:  01/16/23 136/72   Pulse Readings from Last 1 Encounters:  01/16/23 82       Component Value Date/Time   CHOL 157 07/11/2016 0232   TRIG 74 07/11/2016 0232   HDL 45 07/11/2016 0232   CHOLHDL 3.5 07/11/2016 0232   VLDL 15 07/11/2016 0232   LDLCALC 97 07/11/2016 0232    Past Medical History:  Diagnosis Date   Arthritis    knees   Bladder spasms     BPH (benign prostatic hyperplasia)    DVT (deep venous thrombosis) (HCC) ocyt 2016   left leg tx medically   GERD (gastroesophageal reflux disease)    History of colon polyps    hyperplastic 06-14-2010   Hydronephrosis    Hypertension    Obesity, morbid (HCC)    S/P TAVR (transcatheter aortic valve replacement) 01/15/2022   s/p TAVR with a 26 mm Edwards S3UR via the TF approach by Dr. Clifton James & Dr. Delia Chimes   Urinary retention     Current Outpatient Medications on File Prior to Visit  Medication Sig Dispense Refill   allopurinol (ZYLOPRIM) 100 MG tablet Take 200 mg by mouth every morning.     amoxicillin (AMOXIL) 500 MG tablet Take 4 tablets (2,000 mg total) by mouth as directed. 1 hour prior to dental work including cleanings 12 tablet 12   apixaban (ELIQUIS) 5 MG TABS tablet Take 1 tablet (5 mg total) by mouth 2 (two) times daily. Resume on 10/11 morning 180 tablet 3   carvedilol (COREG) 12.5 MG tablet Take 1 tablet (  12.5 mg total) by mouth 2 (two) times daily with a meal. 90 tablet 3   Cholecalciferol (VITAMIN D) 50 MCG (2000 UT) tablet Take 2,000 Units by mouth daily.     Coenzyme Q10 (COQ-10) 200 MG CAPS Take 200 mg by mouth daily.     cyclobenzaprine (FLEXERIL) 10 MG tablet Take 10 mg by mouth 3 (three) times daily as needed for muscle spasms.     diltiazem (TIAZAC) 240 MG 24 hr capsule Take 240 mg by mouth daily.     Docosahexaenoic Acid (DHA OMEGA 3 PO) Take by mouth.     furosemide (LASIX) 80 MG tablet Take 1 tablet (80 mg total) by mouth 2 (two) times daily. 90 tablet 2   Garlic 1000 MG CAPS Take 1,000 mg by mouth daily.     hydrALAZINE (APRESOLINE) 25 MG tablet Take 25 mg by mouth 2 (two) times daily with a meal.  3   isosorbide mononitrate (IMDUR) 30 MG 24 hr tablet Take 1 tablet (30 mg total) by mouth daily. 90 tablet 3   Lecithin 400 MG CAPS Take 1 capsule by mouth daily.     metFORMIN (GLUCOPHAGE) 500 MG tablet Take 500 mg by mouth daily.     naphazoline-pheniramine  (ALLERGY EYE) 0.025-0.3 % ophthalmic solution Place 1 drop into both eyes 4 (four) times daily as needed for eye irritation.     Omega-3 Fatty Acids (FISH OIL) 1000 MG CAPS Take 1,000 mg by mouth daily.      Potassium Chloride ER 20 MEQ TBCR Take 20 mEq by mouth 2 (two) times daily. with food     Semaglutide-Weight Management (WEGOVY) 0.5 MG/0.5ML SOAJ INJECT 0.5 MG INTO THE SKIN ONE TIME PER WEEK 2 mL 3   Semaglutide-Weight Management 1 MG/0.5ML SOAJ Inject 1 mg into the skin once a week. 2 mL 3   Semaglutide-Weight Management 1.7 MG/0.75ML SOAJ Inject 1.7 mg into the skin once a week. 3 mL 3   Semaglutide-Weight Management 2.4 MG/0.75ML SOAJ Inject 2.4 mg into the skin once a week. 3 mL 3   tamsulosin (FLOMAX) 0.4 MG CAPS capsule Take 0.4 mg by mouth every morning.     Turmeric 500 MG TABS Take 500 mg by mouth daily.     vitamin B-12 (CYANOCOBALAMIN) 1000 MCG tablet Take 1,000 mcg by mouth daily.     vitamin C (ASCORBIC ACID) 500 MG tablet Take 500 mg by mouth daily.      No current facility-administered medications on file prior to visit.    Allergies  Allergen Reactions   Atorvastatin Other (See Comments)    Muscle fatigue, soreness and dreams crazy Other reaction(s): severe arthralgias/myalgias Other reaction(s): severe arthralgias/myalgias   Aspirin Other (See Comments)    Avoid to kidney function Other reaction(s): **Reduced Kidney Function Avoid to kidney function  Other reaction(s): **Reduced Kidney Function   Simvastatin Other (See Comments)    Fatigue, soreness   Statins Other (See Comments)    Fatigue, soreness     Assessment/Plan:  1. Weight loss - Patient has not met goal of at least 5% of body weight loss with comprehensive lifestyle modifications alone in the past 3-6 months. Pharmacotherapy is appropriate to pursue as augmentation. PA for Zepbound approved. Coverage assessment revels Zepbound would be covered however due to deductible 1 month cost is around $550 -  $600. Advised patient to get enrolled in monthly payment plan from insurance to spread deductible over the year ($2000/12=166.66) that would lower the up front medication  cost. Confirmed patient has no personal or family history of medullary thyroid carcinoma (MTC) or Multiple Endocrine Neoplasia syndrome type 2 (MEN 2). Injection technique reviewed at today's visit.  Advised patient on common side effects including nausea, diarrhea, dyspepsia, decreased appetite, and fatigue. Counseled patient on reducing meal size and how to titrate medication to minimize side effects. Counseled patient to call if intolerable side effects or if experiencing dehydration, abdominal pain, or dizziness. Patient will adhere to dietary modifications and will target at least 150 minutes of moderate intensity exercise weekly.   Follow up in 2-3 days to see patient be able to afford Zepbound and wanted to go on the therapy   Thank you,  Carmela Hurt, Pharm.D [facility] A Division of  [facility] [address] [place], [place] [address]  Phone: [phone]; Fax: [phone]

## 2023-02-25 NOTE — Telephone Encounter (Signed)
Pt stated someone called him today. He was retuning call

## 2023-02-26 ENCOUNTER — Encounter: Payer: Self-pay | Admitting: Student

## 2023-02-26 ENCOUNTER — Telehealth: Payer: Self-pay | Admitting: Pharmacy Technician

## 2023-02-26 ENCOUNTER — Ambulatory Visit: Payer: Medicare HMO | Attending: Cardiology | Admitting: Student

## 2023-02-26 ENCOUNTER — Other Ambulatory Visit (HOSPITAL_COMMUNITY): Payer: Self-pay

## 2023-02-26 NOTE — Telephone Encounter (Signed)
Pharmacy Patient Advocate Encounter   Received notification from Pt Calls Messages that prior authorization for wegovy is required/requested.   Insurance verification completed.   The patient is insured through [employer] .   Per test claim: PA required; PA submitted to above mentioned insurance via CoverMyMeds Key/confirmation #/EOC [id number] Status is pending

## 2023-02-26 NOTE — Patient Instructions (Signed)
Zepbound PA has been approved. Let us know from when you wanted to start the therapy and we will send prescription to your preferred pharmacy.

## 2023-02-26 NOTE — Telephone Encounter (Signed)
Pharmacy Patient Advocate Encounter  Received notification from [facility] that Prior Authorization for zepbound has been APPROVED from 02/05/23 to 02/03/24. Ran test claim, Copay is $597.17 one month (DEDUCTIBLE). This test claim was processed through [facility]- copay amounts may vary at other pharmacies due to pharmacy/plan contracts, or as the patient moves through the different stages of their insurance plan.   PA #/Case ID/Reference #: [id number]

## 2023-02-26 NOTE — Telephone Encounter (Signed)
Pharmacy Patient Advocate Encounter   Received notification from Pt Calls Messages that prior authorization for zepbound is required/requested.   Insurance verification completed.   The patient is insured through [employer] .   Per test claim: PA required; PA submitted to above mentioned insurance via CoverMyMeds Key/confirmation #/EOC [id number] Status is pending

## 2023-02-26 NOTE — Telephone Encounter (Signed)
Pharmacy Patient Advocate Encounter  Received notification from [facility] that Prior Authorization for wegovy has been APPROVED from 02/26/23 to 02/04/24. Ran test claim, Copay is $708.27 one month (DEDUCTIBLE). This test claim was processed through [facility]- copay amounts may vary at other pharmacies due to pharmacy/plan contracts, or as the patient moves through the different stages of their insurance plan.   PA #/Case ID/Reference #: [id number]

## 2023-03-10 ENCOUNTER — Other Ambulatory Visit: Payer: Self-pay | Admitting: Internal Medicine

## 2023-03-10 ENCOUNTER — Ambulatory Visit
Admission: RE | Admit: 2023-03-10 | Discharge: 2023-03-10 | Disposition: A | Discharge status: Home / Self Care | Payer: Medicare HMO | Source: Ambulatory Visit | Attending: Internal Medicine | Admitting: Internal Medicine

## 2023-03-10 DIAGNOSIS — R059 Cough, unspecified: Secondary | ICD-10-CM | POA: Diagnosis not present

## 2023-03-10 DIAGNOSIS — R0609 Other forms of dyspnea: Secondary | ICD-10-CM

## 2023-03-11 ENCOUNTER — Telehealth: Payer: Self-pay | Admitting: Pharmacist

## 2023-03-11 MED ORDER — ZEPBOUND 2.5 MG/0.5ML ~~LOC~~ SOAJ: 2.5000 mg | SUBCUTANEOUS | 0 refills | Status: DC

## 2023-03-11 NOTE — Telephone Encounter (Signed)
Patient called back. Want [place] to [facility]. If he will start therapy he will let us know so we can titrate dose in 4 weeks.

## 2023-03-11 NOTE — Addendum Note (Signed)
Addended by: Tylene Fantasia on: 03/11/2023 03:01 PM   Modules accepted: Orders

## 2023-03-11 NOTE — Telephone Encounter (Signed)
Call to follow up on GLP1 treatment. Whether he is ready to start Zepbound or not.  His VM has not been set up yet so called wife. She will get him to give [place] call back.

## 2023-04-01 ENCOUNTER — Telehealth: Payer: Self-pay | Admitting: Cardiology

## 2023-04-01 DIAGNOSIS — Z952 Presence of prosthetic heart valve: Secondary | ICD-10-CM

## 2023-04-01 DIAGNOSIS — G4733 Obstructive sleep apnea (adult) (pediatric): Secondary | ICD-10-CM

## 2023-04-01 DIAGNOSIS — I480 Paroxysmal atrial fibrillation: Secondary | ICD-10-CM

## 2023-04-01 MED ORDER — METOLAZONE 5 MG PO TABS: 5.0000 mg | ORAL_TABLET | ORAL | 2 refills | Status: DC

## 2023-04-01 NOTE — Telephone Encounter (Signed)
 Recommend new sleep study referral to Dr. Mayford Knife. Okay to refill metolazone 5 mg every other day (3 times weekly to further help with leg edema and shortness of breath).  Thanks MJP

## 2023-04-01 NOTE — Telephone Encounter (Signed)
 Spoke with the pt and made him aware that per Dr. Rosemary Holms, we will send in metolazone 5 mg po every other day (take on Mon, Wed, Fridays).  Pt is aware that we will place a referral to our sleep Provider Dr. Mayford Knife, so that he can establish with her and get all sleep supplies updated and ordered.   Pt is aware I will send her scheduler Garald Braver a message to call him back sometime this week to arrange his sleep appt with Dr. Mayford Knife.   Confirmed the pharmacy of choice with the pt.   Pt verbalized understanding and agrees with this plan.

## 2023-04-01 NOTE — Telephone Encounter (Signed)
 Pt called in with two questions/concerns.   Pt states he was prescribed Metolazone 5 mg 2x weekly for swelling. He states it was prescribed by Dr. Kevan Ny, previous PCP he had. He asked if Dr. Rosemary Holms can prescribe this, he only has 4 pills left. He states he is swelling again in his legs and experiencing some SOB (nothing currently, does not sound SOB over phone). Pt states he has not noticed any weight gain due to the swelling either.  He also stated he hasn't been  getting any sleep and Dr. Rosemary Holms had ordered a CPAP for him years ago. His last sleep study was 2021.  He would like to inquire about getting a new CPAP mask ordered. Please advise.

## 2023-04-09 NOTE — Telephone Encounter (Signed)
 Pt is scheduled for his sleep consult appt with Dr. Mayford Knife on 04/15/23.

## 2023-04-15 ENCOUNTER — Ambulatory Visit: Payer: Medicare HMO | Attending: Cardiology | Admitting: Cardiology

## 2023-04-15 ENCOUNTER — Other Ambulatory Visit: Payer: Self-pay

## 2023-04-15 ENCOUNTER — Telehealth: Payer: Self-pay | Admitting: *Deleted

## 2023-04-15 VITALS — BP 112/68 | HR 91 | Ht 71.0 in | Wt 295.2 lb

## 2023-04-15 DIAGNOSIS — I10 Essential (primary) hypertension: Secondary | ICD-10-CM

## 2023-04-15 DIAGNOSIS — I480 Paroxysmal atrial fibrillation: Secondary | ICD-10-CM

## 2023-04-15 DIAGNOSIS — G4733 Obstructive sleep apnea (adult) (pediatric): Secondary | ICD-10-CM

## 2023-04-15 MED ORDER — APIXABAN 5 MG PO TABS: 5.0000 mg | ORAL_TABLET | Freq: Two times a day (BID) | ORAL | 0 refills | Status: DC

## 2023-04-15 NOTE — Telephone Encounter (Signed)
 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 I reached out to [employer] spoke to Compliance and they state the patient has a balance and will have to pay his balance before he can get new supplies.  a new mask order placed a new oxygenator from his DME order placed  Upon patient request DME selection is [facility]. Patient understands he will be contacted by [facility] to set up his cpap. Patient understands to call if [facility] does not contact him with new setup in a timely manner. Patient understands they will be called once confirmation has been received from [facility]/ that they have received their new machine to schedule 10 week follow up appointment.   [facility] notified of new cpap order  Please add to airview Patient was grateful for the call and thanked me.

## 2023-04-15 NOTE — Progress Notes (Signed)
 Sleep Medicine CONSULT Note    Date:  04/15/2023   ID:  Stephen Maldonado, DOB February 21, 1955, MRN [id number]  PCP:  Emilio Aspen, MD  Cardiologist: Elder Negus, MD   Chief Complaint  Patient presents with   New Patient (Initial Visit)    Obstructive sleep apnea    History of Present Illness:  Stephen Maldonado is a 68 y.o. male who is being seen today for the evaluation of obstructive sleep apnea at the request of Truett Mainland, MD.  This is a 68 year old male with a history of GERD, hypertension, morbid obesity, aortic stenosis status post TAVR and obstructive sleep apnea on CPAP therapy.  He has not been followed by sleep physician and is now referred for sleep medicine consultation to establish sleep care.  He had his initial sleep study in 2021 after a sleep study showed severe obstructive sleep apnea and severe nocturnal hypoxemia on a home sleep study.  He was titrated ultimately to BiPAP at 26/22 mmHg with supplemental O2 at 2 L.  He has not used his BiPAP in the past few weeks due to a URI. He has an appt with his PCP tomorrow.  He tolerates the under the nose full face mask but the mask broke and he needs a new one.  He feels the pressure is adequate.  He has been napping during the day because he cannot sleep at night because whenever he lays down he has pressure on his chest.  This  has been occurring since he had a bronchitis. He recently went to go to bed for the past 3 nights but could not use his CPAP because he was worried he might suffocate with it.   When he uses the device he has significant mouth dryness but does not know how to adjust the humidity. His reservoir is empty every am.    Past Medical History:  Diagnosis Date   Arthritis    knees   Bladder spasms    BPH (benign prostatic hyperplasia)    DVT (deep venous thrombosis) (HCC) ocyt 2016   left leg tx medically   GERD (gastroesophageal reflux disease)    History of colon polyps     hyperplastic 06-14-2010   Hydronephrosis    Hypertension    Obesity, morbid (HCC)    S/P TAVR (transcatheter aortic valve replacement) 01/15/2022   s/p TAVR with a 26 mm Edwards S3UR via the TF approach by Dr. Clifton James & Dr. Delia Chimes   Urinary retention     Past Surgical History:  Procedure Laterality Date   CARDIOVERSION N/A 08/14/2018   Procedure: CARDIOVERSION;  Surgeon: Elder Negus, MD;  Location: [facility];  Service: Cardiovascular;  Laterality: N/A;   COLONOSCOPY  06/14/2010   CORONARY ANGIOGRAPHY N/A 07/11/2016   Procedure: Coronary Angiography;  Surgeon: Orpah Cobb, MD;  Location: [facility];  Service: Cardiovascular;  Laterality: N/A;   CORONARY PRESSURE/FFR STUDY N/A 07/14/2018   Procedure: INTRAVASCULAR PRESSURE WIRE/FFR STUDY;  Surgeon: Elder Negus, MD;  Location: [facility];  Service: Cardiovascular;  Laterality: N/A;   CYSTOSCOPY W/ RETROGRADES Bilateral 10/03/2015   Procedure: CYSTOSCOPY WITH RETROGRADE PYELOGRAM;  Surgeon: Jerilee Field, MD;  Location: [facility];  Service: Urology;  Laterality: Bilateral;   GREEN LIGHT LASER TURP (TRANSURETHRAL RESECTION OF PROSTATE N/A 10/03/2015   Procedure: GREEN LIGHT LASER,  TURP - STAGED(TRANSURETHRAL RESECTION OF PROSTATE;  Surgeon: Jerilee Field, MD;  Location: [facility];  Service: Urology;  Laterality: N/A;   HAND SURGERY Left    INTRAOPERATIVE TRANSTHORACIC ECHOCARDIOGRAM N/A 01/15/2022   Procedure: INTRAOPERATIVE TRANSTHORACIC ECHOCARDIOGRAM;  Surgeon: Kathleene Hazel, MD;  Location: [facility];  Service: Open Heart Surgery;  Laterality: N/A;   KNEE ARTHROSCOPY     not sure which knee   LEFT HEART CATH AND CORONARY ANGIOGRAPHY N/A 07/14/2018   Procedure: LEFT HEART CATH AND CORONARY ANGIOGRAPHY;  Surgeon: Elder Negus, MD;  Location: [facility];  Service: Cardiovascular;  Laterality: N/A;   QUADRICEPS TENDON REPAIR  Bilateral left 02/24/2003/   right 2006   RIGHT/LEFT HEART CATH AND CORONARY ANGIOGRAPHY N/A 11/13/2021   Procedure: RIGHT/LEFT HEART CATH AND CORONARY ANGIOGRAPHY;  Surgeon: Elder Negus, MD;  Location: [facility];  Service: Cardiovascular;  Laterality: N/A;   TOTAL KNEE ARTHROPLASTY Right 12/01/2015   Procedure: RIGHT TOTAL KNEE ARTHROPLASTY;  Surgeon: Kathryne Hitch, MD;  Location: [facility];  Service: Orthopedics;  Laterality: Right;   TOTAL KNEE ARTHROPLASTY Left 01/10/2017   Procedure: LEFT TOTAL KNEE ARTHROPLASTY;  Surgeon: Kathryne Hitch, MD;  Location: [facility];  Service: Orthopedics;  Laterality: Left;   TOTAL SHOULDER ARTHROPLASTY Right 01/23/2018   Procedure: RIGHT REVERSE TOTAL SHOULDER ARTHROPLASTY;  Surgeon: Cammy Copa, MD;  Location: [facility];  Service: Orthopedics;  Laterality: Right;   TRANSCATHETER AORTIC VALVE REPLACEMENT, TRANSFEMORAL N/A 01/15/2022   Procedure: Transcatheter Aortic Valve Replacement, Transfemoral;  Surgeon: Kathleene Hazel, MD;  Location: [facility];  Service: Open Heart Surgery;  Laterality: N/A;    Current Medications: Current Meds  Medication Sig   allopurinol (ZYLOPRIM) 100 MG tablet Take 200 mg by mouth every morning.   amoxicillin (AMOXIL) 500 MG tablet Take 4 tablets (2,000 mg total) by mouth as directed. 1 hour prior to dental work including cleanings   apixaban (ELIQUIS) 5 MG TABS tablet Take 1 tablet (5 mg total) by mouth 2 (two) times daily. Resume on 10/11 morning   carvedilol (COREG) 12.5 MG tablet Take 1 tablet (12.5 mg total) by mouth 2 (two) times daily with a meal.   Cholecalciferol (VITAMIN D) 50 MCG (2000 UT) tablet Take 2,000 Units by mouth daily.   Coenzyme Q10 (COQ-10) 200 MG CAPS Take 200 mg by mouth daily.   cyclobenzaprine (FLEXERIL) 10 MG tablet Take 10 mg by mouth 3 (three) times daily as needed for muscle spasms.   furosemide (LASIX) 80 MG tablet Take 1 tablet (80 mg total) by  mouth 2 (two) times daily.   Garlic 1000 MG CAPS Take 1,000 mg by mouth daily.   hydrALAZINE (APRESOLINE) 25 MG tablet Take 25 mg by mouth 2 (two) times daily with a meal.   Lecithin 400 MG CAPS Take 1 capsule by mouth daily.   metFORMIN (GLUCOPHAGE) 500 MG tablet Take 1,000 mg by mouth 2 (two) times daily with a meal.   metolazone (ZAROXOLYN) 5 MG tablet Take 1 tablet (5 mg total) by mouth every other day. Take on Mondays, Wednesdays, and Fridays.   naphazoline-pheniramine (ALLERGY EYE) 0.025-0.3 % ophthalmic solution Place 1 drop into both eyes 4 (four) times daily as needed for eye irritation.   Omega-3 Fatty Acids (FISH OIL) 1000 MG CAPS Take 1,000 mg by mouth daily.    Potassium Chloride ER 20 MEQ TBCR Take 20 mEq by mouth 2 (two) times daily. with food   tamsulosin (FLOMAX) 0.4 MG CAPS capsule Take 0.4 mg by mouth every morning.  tirzepatide (ZEPBOUND) 2.5 MG/0.5ML Pen Inject 2.5 mg into the skin once a week.   Turmeric 500 MG TABS Take 500 mg by mouth daily.   vitamin C (ASCORBIC ACID) 500 MG tablet Take 500 mg by mouth daily.     Allergies:   Atorvastatin, Aspirin, Simvastatin, and Statins   Social History   Socioeconomic History   Marital status: Married    Spouse name: Not on file   Number of children: 1   Years of education: Not on file   Highest education level: Not on file  Occupational History   Occupation: Equities trader for office equipment  Tobacco Use   Smoking status: Never   Smokeless tobacco: Former    Types: Chew    Quit date: 02/05/1968  Vaping Use   Vaping status: Never Used  Substance and Sexual Activity   Alcohol use: Yes    Comment: 1-2 beers per month   Drug use: No   Sexual activity: Not Currently  Other Topics Concern   Not on file  Social History Narrative   Not on file   Social Drivers of Health   Financial Resource Strain: Not on file  Food Insecurity: No Food Insecurity (01/16/2022)   Hunger Vital Sign    Worried About  Running Out of Food in the Last Year: Never true    Ran Out of Food in the Last Year: Never true  Transportation Needs: No Transportation Needs (01/16/2022)   PRAPARE - Administrator, Civil Service (Medical): No    Lack of Transportation (Non-Medical): No  Physical Activity: Not on file  Stress: Not on file  Social Connections: Unknown (06/19/2021)   Received [facility], [facility]   Social Network    Social Network: Not on file     Family History:  The patient's family history includes Breast cancer in his mother; CAD in his father; Diabetes in his father and mother; Heart failure in his father; Lung cancer in his mother.   ROS:   Please see the history of present illness.    ROS All other systems reviewed and are negative.      No data to display             PHYSICAL EXAM:   VS:  BP 112/68 (BP Location: Right Arm, Patient Position: Sitting)   Pulse 91   Ht 5\' 11"  (1.803 m)   Wt 295 lb 3.2 oz (133.9 kg)   SpO2 94%   BMI 41.17 kg/m    GEN: Well nourished, well developed, in no acute distress  HEENT: normal  Neck: no JVD, carotid bruits, or masses Cardiac: RRR; no murmurs, rubs, or gallops,no edema.  Intact distal pulses bilaterally.  Respiratory:  clear to auscultation bilaterally, normal work of breathing GI: soft, nontender, nondistended, + BS MS: no deformity or atrophy  Skin: warm and dry, no rash Neuro:  Alert and Oriented x 3, Strength and sensation are intact Psych: euthymic mood, full affect  Wt Readings from Last 3 Encounters:  04/15/23 295 lb 3.2 oz (133.9 kg)  02/26/23 (!) 305 lb (138.3 kg)  01/16/23 (!) 301 lb (136.5 kg)      Studies/Labs Reviewed:   Home sleep study, CPAP titration, PAP compliance download  Recent Labs: 07/17/2022: NT-Pro BNP 159    CHA2DS2-VASc Score = 3   This indicates a 3.2% annual risk of stroke. The patient's score is based upon: CHF History: 1 HTN History: 1 Diabetes History: 0 Stroke  History: 0 Vascular Disease History: 0 Age Score: 1 Gender Score: 0           Additional studies/ records that were reviewed today include:  none    ASSESSMENT:    1. OSA (obstructive sleep apnea)   2. Essential hypertension      PLAN:  In order of problems listed above:  OSA - The patient had been tolerating PAP therapy well without any problems until he got bronchitis and since then has not used it in weeks because every time he lays down he feels like he is going to suffocate. -he is seeing his PCP tomorrow about his URI and ongoing SOB -I will get a download from his DME to see if current pressure is ok -I will order him a new mask since his mask has broken -I encouraged him to place a humidifier in his bedroom to help with running out of water in his chamber -I have encouraged him to start using his PAP device again once he gets a new mask -I will order him a new oxygenator from his DME  Hypertension -BP controlled on exam today -Continue prescription drug management with carvedilol 12.5 mg twice daily, hydralazine 25 mg twice daily with as needed refills   Time Spent: 20 minutes total time of encounter, including 15 minutes spent in face-to-face patient care on the date of this encounter. This time includes coordination of care and counseling regarding above mentioned problem list. Remainder of non-face-to-face time involved reviewing chart documents/testing relevant to the patient encounter and documentation in the medical record. I have independently reviewed documentation from referring provider  Medication Adjustments/Labs and Tests Ordered: Current medicines are reviewed at length with the patient today.  Concerns regarding medicines are outlined above.  Medication changes, Labs and Tests ordered today are listed in the Patient Instructions below.  There are no Patient Instructions on file for this visit.   Signed, Armanda Magic, MD  04/15/2023 1:11 PM     [facility] [address] [place], [place], [place]  [address] Phone: [phone]; Fax: [phone]

## 2023-04-15 NOTE — Patient Instructions (Signed)
 Medication Instructions:  Your physician recommends that you continue on your current medications as directed. Please refer to the Current Medication list given to you today.  *If you need a refill on your cardiac medications before your next appointment, please call your pharmacy*   Lab Work: NONE  If you have labs (blood work) drawn today and your tests are completely normal, you will receive your results only by: MyChart Message (if you have MyChart) OR A paper copy in the mail If you have any lab test that is abnormal or we need to change your treatment, we will call you to review the results.   Testing/Procedures: NONE   Follow-Up: At [facility], you and your health needs are our priority.  As part of our continuing mission to provide you with exceptional heart care, we have created designated Provider Care Teams.  These Care Teams include your primary Cardiologist (physician) and Advanced Practice Providers (APPs -  Physician Assistants and Nurse Practitioners) who all work together to provide you with the care you need, when you need it.  We recommend signing up for the patient portal called "MyChart".  Sign up information is provided on this After Visit Summary.  MyChart is used to connect with patients for Virtual Visits (Telemedicine).  Patients are able to view lab/test results, encounter notes, upcoming appointments, etc.  Non-urgent messages can be sent to your provider as well.   To learn more about what you can do with MyChart, go to [email or web address].    Your next appointment:   3 month(s)  Provider:   Armanda Magic, MD (Sleep)

## 2023-04-15 NOTE — Telephone Encounter (Signed)
 Prescription refill request for Eliquis received. Indication:afib Last office visit:3/25 ZOX:WRUEA labs Age: 68 Weight:133.9  kg  Prescription refilled

## 2023-04-16 DIAGNOSIS — G720 Drug-induced myopathy: Secondary | ICD-10-CM | POA: Diagnosis not present

## 2023-04-16 DIAGNOSIS — D6869 Other thrombophilia: Secondary | ICD-10-CM | POA: Diagnosis not present

## 2023-04-16 DIAGNOSIS — I509 Heart failure, unspecified: Secondary | ICD-10-CM | POA: Diagnosis not present

## 2023-04-16 DIAGNOSIS — T466X5A Adverse effect of antihyperlipidemic and antiarteriosclerotic drugs, initial encounter: Secondary | ICD-10-CM | POA: Diagnosis not present

## 2023-04-16 DIAGNOSIS — I4891 Unspecified atrial fibrillation: Secondary | ICD-10-CM | POA: Diagnosis not present

## 2023-04-16 DIAGNOSIS — I10 Essential (primary) hypertension: Secondary | ICD-10-CM | POA: Diagnosis not present

## 2023-04-16 DIAGNOSIS — E1159 Type 2 diabetes mellitus with other circulatory complications: Secondary | ICD-10-CM | POA: Diagnosis not present

## 2023-04-16 DIAGNOSIS — N1832 Chronic kidney disease, stage 3b: Secondary | ICD-10-CM | POA: Diagnosis not present

## 2023-04-16 DIAGNOSIS — Z79899 Other long term (current) drug therapy: Secondary | ICD-10-CM | POA: Diagnosis not present

## 2023-04-17 ENCOUNTER — Encounter: Payer: Self-pay | Admitting: Cardiology

## 2023-04-17 ENCOUNTER — Ambulatory Visit: Payer: Medicare HMO | Attending: Cardiology | Admitting: Cardiology

## 2023-04-17 ENCOUNTER — Other Ambulatory Visit: Payer: Self-pay

## 2023-04-17 VITALS — BP 130/84 | HR 77 | Ht 71.0 in | Wt 297.4 lb

## 2023-04-17 DIAGNOSIS — Z952 Presence of prosthetic heart valve: Secondary | ICD-10-CM | POA: Diagnosis not present

## 2023-04-17 DIAGNOSIS — I10 Essential (primary) hypertension: Secondary | ICD-10-CM | POA: Diagnosis not present

## 2023-04-17 DIAGNOSIS — R0609 Other forms of dyspnea: Secondary | ICD-10-CM

## 2023-04-17 DIAGNOSIS — I480 Paroxysmal atrial fibrillation: Secondary | ICD-10-CM

## 2023-04-17 MED ORDER — APIXABAN 5 MG PO TABS: 5.0000 mg | ORAL_TABLET | Freq: Two times a day (BID) | ORAL | 0 refills | Status: DC

## 2023-04-17 NOTE — Progress Notes (Signed)
 Cardiology Office Note:  .   Date:  04/17/2023  ID:  Stephen Maldonado, DOB 1955-10-30, MRN [id number] PCP: Emilio Aspen, MD  [facility] Providers Cardiologist:  Truett Mainland, MD PCP: Emilio Aspen, MD  Chief Complaint  Patient presents with   Exertional dyspnea      History of Present Illness: .    Stephen Maldonado is a 68 y.o. male with hypertension, hyperlipidemia, moderate CAD, type II DM, h/o NICM with recovered LVEF, PAF, moderate obesity, OSA, s/p TAVR for severe AS   Patient does not have any chest pain, but continues to have exertional dyspnea.  Recently, he is at a cough, he has been treated for bronchitis by his PCP.  Orthopnea improved with Lasix, so did leg edema.  Recently, A1c has been elevated >9%.  He is working closely with Dr. Orson Aloe.   Vitals:   04/17/23 1613  BP: 130/84  Pulse: 77  SpO2: 93%      ROS:  Review of Systems  Cardiovascular:  Positive for dyspnea on exertion (NYHA class II). Negative for chest pain, leg swelling, palpitations and syncope.     Studies Reviewed: Marland Kitchen        EKG 04/17/2023:  Sinus rhythm with 1st degree A-V block Incomplete right bundle branch block When compared with ECG of 15-Jan-2022 16:24, No significant change was found     Risk Assessment/Calculations:    CHA2DS2-VASc Score = 3  This indicates a 3.23.2% annual risk of stroke. The patient's score is based upon: CHF History: 1 HTN History: 1 Diabetes History: 0 Stroke History: 0 Vascular Disease History: 0 Age Score: 1 Gender Score: 0    Independently interpreted 10/2022-03/2023: Chol 173, TG 215, HDL 38, LDL 98 HbA1C 10.6% Hb 13.8 BUN 35, eGFR 33 K 4.7  Echocardiogram 01/2023: 1. Left ventricular ejection fraction, by estimation, is 55 to 60%. The  left ventricle has normal function. Left ventricular endocardial border  not optimally defined to evaluate regional wall motion. There is mild left  ventricular hypertrophy.  Left  ventricular diastolic parameters are consistent with Grade I diastolic  dysfunction (impaired relaxation). Elevated left atrial pressure.   2. Right ventricular systolic function is normal. The right ventricular  size is normal.   3. The mitral valve is normal in structure. No evidence of mitral valve  regurgitation. No evidence of mitral stenosis.   4. Edwards Sapien 3 THV size 26 mm is in the AV position. Mean gradient  11 mmHg is within normal published range and stable from Mar 06, 2022  study. . The aortic valve has been repaired/replaced. Aortic valve  regurgitation is not visualized. No aortic  stenosis is present.   5. The inferior vena cava is normal in size with greater than 50%  respiratory variability, suggesting right atrial pressure of 3 mmHg.   Physical Exam:   Physical Exam Vitals and nursing note reviewed.  Constitutional:      General: He is not in acute distress. Neck:     Vascular: No JVD.  Cardiovascular:     Rate and Rhythm: Normal rate and regular rhythm.     Heart sounds: Murmur heard.     Harsh midsystolic murmur is present with a grade of 2/6 at the upper right sternal border radiating to the neck.  Pulmonary:     Effort: Pulmonary effort is normal.     Breath sounds: Normal breath sounds. No wheezing or rales.  Musculoskeletal:     Right lower  leg: No edema.     Left lower leg: No edema.      VISIT DIAGNOSES:   ICD-10-CM   1. S/P TAVR (transcatheter aortic valve replacement)  Z95.2 EKG 12-Lead    AMB Referral to [facility] Pharm-D    2. Exertional dyspnea  R06.09 EKG 12-Lead    AMB Referral to [facility] Pharm-D    3. PAF (paroxysmal atrial fibrillation) (HCC)  I48.0 EKG 12-Lead    AMB Referral to [facility] Pharm-D    4. Primary hypertension  I10 EKG 12-Lead    AMB Referral to [facility] Pharm-D        ASSESSMENT AND PLAN: .    VIRGEL Maldonado is a 68 y.o. male with hypertension, hyperlipidemia, moderate CAD, h/o NICM with recovered  LVEF, PAF, moderate obesity, OSA, now s/p TAVR for severe AS   Aortic stenosis: S/p TAVR Edwards Sapien 3 THV (size 26 mm) (01/2022). Valve functioning well based on physical exam and echocardiogram 02/2022. Repeat echocardiogram on 01/22/2023. NYHA class II symptoms at this time.   Exertional dyspnea: NYHA class II. Symptoms have persisted in spite of TAVR, EF has been normal, along with normal proBNP. In the past, we have discussed possibility that his exertional dyspnea is angina equivalent.  However, in absence of ischemia on stress test, low GFR, and patient's overall reluctance to pursue invasive cardiac workup, we have decided to not perform cardiac catheterization. Continue current antianginal therapy.  In addition, I do think his dyspnea could at least partly be contributed by his obesity.  Given his CAD, now uncontrolled diabetes, and obesity, I do think he would benefit from GLP-1 agonist.  I have referred him to our pharmacist for further evaluation and insurance approval discussion.   Paroxysmal Afib: Now maintaining sinus rhythm s/p cardioversion 08/14/2018. CHA2DS2VASc score 3, annual stroke risk 3% Continue eliquis 5 mg bid.    Essential hypertension: Controlled.   OSA on CPAP: Tolerating well  F/u in 6 months  Signed, Elder Negus, MD

## 2023-04-17 NOTE — Telephone Encounter (Signed)
 Prescription refill request for Eliquis received. Indication: Afib  Last office visit: 04/17/23 (Patwardhan)  Scr: 1.51 (01/16/22 via [facility])  Age: 68 Weight: 134.9kg  Per message from Wayne, LPN:  Loa Socks, LPN  P Cv Div Anticoagulation Pt just seen in clinic by Dr. Rosemary Holms  He had labs done by PCP which pulled through our [facility] and Dr. Rosemary Holms will also make mention of those results in his note, so that you have to refer to when refilling his eliquis   Per Dr Damian Leavell note, Eliquis 5mg  BID is appropriate dose. Refill sent.

## 2023-04-17 NOTE — Patient Instructions (Signed)
 Medication Instructions:   Your physician recommends that you continue on your current medications as directed. Please refer to the Current Medication list given to you today.  *If you need a refill on your cardiac medications before your next appointment, please call your pharmacy*   You have been referred to OUR L[facility]ID [facility] TO SEE THE PHARMACIST FOR CONSIDERATION OF WEIGHT LOSS INJECTIONS     Follow-Up: At [facility], you and your health needs are our priority.  As part of our continuing mission to provide you with exceptional heart care, we have created designated Provider Care Teams.  These Care Teams include your primary Cardiologist (physician) and Advanced Practice Providers (APPs -  Physician Assistants and Nurse Practitioners) who all work together to provide you with the care you need, when you need it.  We recommend signing up for the patient portal called "MyChart".  Sign up information is provided on this After Visit Summary.  MyChart is used to connect with patients for Virtual Visits (Telemedicine).  Patients are able to view lab/test results, encounter notes, upcoming appointments, etc.  Non-urgent messages can be sent to your provider as well.   To learn more about what you can do with MyChart, go to [email or web address].    Your next appointment:   6 month(s)  Provider:   Elder Negus, MD     Other Instructions    1st Floor: - Lobby - Registration  - Pharmacy  - Lab - Cafe  2nd Floor: - PV Lab - Diagnostic Testing (echo, CT, nuclear med)  3rd Floor: - Vacant  4th Floor: - TCTS (cardiothoracic surgery) - AFib Clinic - Structural Heart Clinic - Vascular Surgery  - Vascular Ultrasound  5th Floor: - HeartCare Cardiology (general and EP) - Clinical Pharmacy for coumadin, hypertension, lipid, weight-loss medications, and med management appointments    Valet parking services will be available as well.

## 2023-04-21 ENCOUNTER — Telehealth: Payer: Self-pay | Admitting: Pharmacy Technician

## 2023-04-21 ENCOUNTER — Other Ambulatory Visit (HOSPITAL_COMMUNITY): Payer: Self-pay

## 2023-04-21 ENCOUNTER — Telehealth: Payer: Self-pay | Admitting: Pharmacist

## 2023-04-21 NOTE — Telephone Encounter (Signed)
 Pharmacy Patient Advocate Encounter   Received notification from Pt Calls Messages that prior authorization for Stephen Maldonado is required/requested.   Insurance verification completed.   The patient is insured through [employer] .   Per test claim: The current 04/21/23 day co-pay is, $242.32- one month (DEDUCTIBLE).  No PA needed at this time. This test claim was processed through [facility]- copay amounts may vary at other pharmacies due to pharmacy/plan contracts, or as the patient moves through the different stages of their insurance plan.

## 2023-04-21 NOTE — Telephone Encounter (Signed)
 Call pt N/A VM is not set up yet. So called wife  to see how patient is doing on Zepbound. Wife reports he never started the medication due to high deductible. His recent A1c 8.4 at PCP ( 04/16/23) he is currently only on metformin. Will assess coverage for Mounjaro or Ozempic.

## 2023-04-21 NOTE — Telephone Encounter (Signed)
 Pharmacy Patient Advocate Encounter   Received notification from Pt Calls Messages that prior authorization for Ozempic is required/requested.   Insurance verification completed.   The patient is insured through [employer] .   Per test claim: The current 04/21/23 day co-pay is, $242.32- one month (DEDUCTIBLE).  No PA needed at this time. This test claim was processed through [facility]- copay amounts may vary at other pharmacies due to pharmacy/plan contracts, or as the patient moves through the different stages of their insurance plan.

## 2023-04-21 NOTE — Telephone Encounter (Signed)
 Spoke to pt see other encounter for Ozempic, med assistance request sent to the pool for high co-pay

## 2023-04-21 NOTE — Telephone Encounter (Signed)
 Patient was returning call. Please advise ?

## 2023-04-22 ENCOUNTER — Telehealth: Payer: Self-pay | Admitting: Pharmacy Technician

## 2023-04-22 NOTE — Telephone Encounter (Signed)
 PAP: Patient assistance application for ozempic through [employer] has been mailed to pt's home address on file. Provider portion of application will be faxed to provider's office. -called wife

## 2023-04-23 ENCOUNTER — Other Ambulatory Visit: Payer: Self-pay | Admitting: Cardiology

## 2023-04-23 DIAGNOSIS — I480 Paroxysmal atrial fibrillation: Secondary | ICD-10-CM

## 2023-04-24 ENCOUNTER — Telehealth: Payer: Self-pay | Admitting: Pharmacy Technician

## 2023-04-24 ENCOUNTER — Telehealth: Payer: Self-pay | Admitting: *Deleted

## 2023-04-24 ENCOUNTER — Other Ambulatory Visit (HOSPITAL_COMMUNITY): Payer: Self-pay

## 2023-04-24 DIAGNOSIS — I10 Essential (primary) hypertension: Secondary | ICD-10-CM

## 2023-04-24 DIAGNOSIS — G4733 Obstructive sleep apnea (adult) (pediatric): Secondary | ICD-10-CM

## 2023-04-24 MED ORDER — APIXABAN 5 MG PO TABS: 5.0000 mg | ORAL_TABLET | Freq: Two times a day (BID) | ORAL | 1 refills | Status: DC

## 2023-04-24 NOTE — Addendum Note (Signed)
 Addended by: Satira Sark on: 04/24/2023 09:37 AM   Modules accepted: Orders

## 2023-04-24 NOTE — Telephone Encounter (Signed)
Order placed to [facility] via community message. 

## 2023-04-24 NOTE — Telephone Encounter (Signed)
-----   Message from Armanda Magic sent at 04/15/2023  1:21 PM EDT ----- Please order a new mask from his DME  Please order a new oxygenator from the DME at [address] to run with his CPAP  Get a download from the DME

## 2023-04-24 NOTE — Telephone Encounter (Signed)
Orders placed to [employer] [facility] via community message. 

## 2023-04-24 NOTE — Telephone Encounter (Signed)
-----   Message from Armanda Magic sent at 04/15/2023  1:22 PM EDT ----- Order a new under the nose FFM and PAP supplies as well

## 2023-04-24 NOTE — Telephone Encounter (Signed)
 Pt last saw Dr Rosemary Holms 04/17/23, last labs 04/16/23 Creat 2.49 at [place], age 68, weight 134.9kg, based on specified criteria pt is on appropriate dosage of Eliquis 5mg  BID for afib.  Will refill rx.  Pharmacy requesting prior auth per refill request.

## 2023-04-24 NOTE — Telephone Encounter (Signed)
 Pharmacy Patient Advocate Encounter  Received notification from [facility] that Prior Authorization for eliquis has been APPROVED from 02/05/23 to 02/04/24. Spoke to pharmacy to process.Copay is $242.32.    PA #/Case ID/Reference #: [id number]

## 2023-04-30 NOTE — Telephone Encounter (Addendum)
 From [employer] [facility]-Garcia, Cristi Loron, CMA In order to provide the patient with O2 we will need a titrated sleep study within the last 30days Showing where the patient desat while on pap therapy.

## 2023-05-01 ENCOUNTER — Other Ambulatory Visit: Payer: Self-pay | Admitting: Neurosurgery

## 2023-05-01 DIAGNOSIS — M47816 Spondylosis without myelopathy or radiculopathy, lumbar region: Secondary | ICD-10-CM | POA: Diagnosis not present

## 2023-05-05 NOTE — Telephone Encounter (Signed)
 Please order an in lab CPAP titration with O2 titration as well at current O2 dose   WILL PRECERT TITRATION

## 2023-05-05 NOTE — Addendum Note (Signed)
 Addended by: Reesa Chew on: 05/05/2023 06:44 PM   Modules accepted: Orders

## 2023-05-06 NOTE — Telephone Encounter (Signed)
 I spoke to the wife and its unknown if they have received the application via mail. She asked if I would send it to her via email, I did along with everything they need to send back

## 2023-05-09 ENCOUNTER — Encounter: Payer: Self-pay | Admitting: Neurosurgery

## 2023-05-12 ENCOUNTER — Ambulatory Visit
Admission: RE | Admit: 2023-05-12 | Discharge: 2023-05-12 | Disposition: A | Discharge status: Home / Self Care | Source: Ambulatory Visit | Attending: Neurosurgery | Admitting: Neurosurgery

## 2023-05-12 DIAGNOSIS — M47816 Spondylosis without myelopathy or radiculopathy, lumbar region: Secondary | ICD-10-CM | POA: Diagnosis not present

## 2023-05-14 NOTE — Telephone Encounter (Signed)
 I spoke to wife and she said they are working on the application and should have mailed out this week

## 2023-05-21 NOTE — Telephone Encounter (Signed)
 Spoke to wife and she said she is still trying to get her husband to fill out the application

## 2023-05-22 DIAGNOSIS — M47816 Spondylosis without myelopathy or radiculopathy, lumbar region: Secondary | ICD-10-CM | POA: Diagnosis not present

## 2023-05-27 NOTE — Telephone Encounter (Signed)
 Prior Authorization for titration sent to [facility] via web portal. Tracking Number . Approved Authorization #[id number]  Tracking #[id number] Valid Dates -06/02/2023 - 08/01/2023

## 2023-05-28 NOTE — Telephone Encounter (Signed)
 Lmom for wife -still have not received application

## 2023-06-02 DIAGNOSIS — N189 Chronic kidney disease, unspecified: Secondary | ICD-10-CM | POA: Diagnosis not present

## 2023-06-02 DIAGNOSIS — I480 Paroxysmal atrial fibrillation: Secondary | ICD-10-CM | POA: Diagnosis not present

## 2023-06-02 DIAGNOSIS — I129 Hypertensive chronic kidney disease with stage 1 through stage 4 chronic kidney disease, or unspecified chronic kidney disease: Secondary | ICD-10-CM | POA: Diagnosis not present

## 2023-06-02 DIAGNOSIS — D631 Anemia in chronic kidney disease: Secondary | ICD-10-CM | POA: Diagnosis not present

## 2023-06-02 DIAGNOSIS — N2581 Secondary hyperparathyroidism of renal origin: Secondary | ICD-10-CM | POA: Diagnosis not present

## 2023-06-02 DIAGNOSIS — N184 Chronic kidney disease, stage 4 (severe): Secondary | ICD-10-CM | POA: Diagnosis not present

## 2023-06-02 DIAGNOSIS — N320 Bladder-neck obstruction: Secondary | ICD-10-CM | POA: Diagnosis not present

## 2023-06-02 DIAGNOSIS — N186 End stage renal disease: Secondary | ICD-10-CM | POA: Diagnosis not present

## 2023-06-04 NOTE — Telephone Encounter (Signed)
 I spoke to the patient and he said he has appt Wednesday and he will bring this in then and will provide the provider the provider portion. Once I receive, I will send off to [facility]

## 2023-06-09 ENCOUNTER — Other Ambulatory Visit: Payer: Self-pay | Admitting: Nephrology

## 2023-06-09 DIAGNOSIS — N184 Chronic kidney disease, stage 4 (severe): Secondary | ICD-10-CM

## 2023-06-09 DIAGNOSIS — N189 Chronic kidney disease, unspecified: Secondary | ICD-10-CM

## 2023-06-12 ENCOUNTER — Ambulatory Visit: Admitting: Pharmacist

## 2023-06-12 NOTE — Telephone Encounter (Signed)
 Tried to call patient and no answer. No vm

## 2023-06-19 DIAGNOSIS — E1159 Type 2 diabetes mellitus with other circulatory complications: Secondary | ICD-10-CM | POA: Diagnosis not present

## 2023-06-19 DIAGNOSIS — I10 Essential (primary) hypertension: Secondary | ICD-10-CM | POA: Diagnosis not present

## 2023-06-19 DIAGNOSIS — D6869 Other thrombophilia: Secondary | ICD-10-CM | POA: Diagnosis not present

## 2023-06-19 DIAGNOSIS — M5441 Lumbago with sciatica, right side: Secondary | ICD-10-CM | POA: Diagnosis not present

## 2023-06-19 DIAGNOSIS — K5901 Slow transit constipation: Secondary | ICD-10-CM | POA: Diagnosis not present

## 2023-06-19 DIAGNOSIS — M5442 Lumbago with sciatica, left side: Secondary | ICD-10-CM | POA: Diagnosis not present

## 2023-06-19 DIAGNOSIS — N1832 Chronic kidney disease, stage 3b: Secondary | ICD-10-CM | POA: Diagnosis not present

## 2023-06-19 DIAGNOSIS — G8929 Other chronic pain: Secondary | ICD-10-CM | POA: Diagnosis not present

## 2023-06-20 ENCOUNTER — Telehealth: Payer: Self-pay

## 2023-06-20 DIAGNOSIS — I10 Essential (primary) hypertension: Secondary | ICD-10-CM

## 2023-06-23 ENCOUNTER — Telehealth: Payer: Self-pay | Admitting: Pharmacist

## 2023-06-23 NOTE — Progress Notes (Addendum)
   06/23/2023  Patient ID: [name], male   DOB: Feb 21, 1955, 67 y.o.   MRN: [id number]  Called and spoke with the patient on the phone today. For DM, recently lowered Metformin to 1000mg  daily as recommended by Nephrology. Using samples of Ozempic  and has 5 weeks left currently. Unknown his income. Reports his spouse files taxes, but he does not. Believes income is close to cut off but should qualify. Would like to proceed with application anyways. Said he already signed the form and just needs submitted- nurse should have it.  For Eliquis , not currently taking due to inability to afford it. Saw Cardiology checked into it and it is $242 for the first month. Should then go down to $47 monthly. (Has $250 drug premium to meet first). Eliquis  PAP requires patient pay ~$2400 OOP first prior to qualifying, but will go to $0 when reaching $2000 OOP anyways.  Xarelto  PAP is even harder to qualify for. Briefly discussed warfarin and does not believe he would be able to do INR checks every 2 to 4 weeks due to distance from Cardiology office. Advised no need to discuss with Cardio as they are already aware of the issue.   Will submit Ozempic  PAP for now and follow-up when able. Patient will NOT do fingersticks for BG checks and cannot afford CGM at $30 monthly.   Update from 07/07/23:  -  Received fax stating that patient needs financial proof for application approval. Called and discussed with the patient. Confirmed understanding. Said wife took the paperwork and don't know when they'll have time to drop off. Advised I can send an email as well if that helps. Will discuss with wife and call back with decision.   Delvin File, PharmD [facility]  Phone Number: [phone]

## 2023-07-04 ENCOUNTER — Encounter: Payer: Self-pay | Admitting: Cardiology

## 2023-07-04 ENCOUNTER — Ambulatory Visit: Attending: Cardiology | Admitting: Cardiology

## 2023-07-04 VITALS — BP 150/74 | HR 77 | Ht 71.0 in | Wt 287.2 lb

## 2023-07-04 DIAGNOSIS — G4733 Obstructive sleep apnea (adult) (pediatric): Secondary | ICD-10-CM | POA: Diagnosis not present

## 2023-07-04 DIAGNOSIS — I10 Essential (primary) hypertension: Secondary | ICD-10-CM | POA: Diagnosis not present

## 2023-07-04 NOTE — Progress Notes (Signed)
 Sleep Medicine Note    Date:  07/04/2023   ID:  Stephen Maldonado, DOB 06/17/1955, MRN [id number]  PCP:  Benedetta Bradley, MD  Cardiologist: Cody Das, MD   Chief Complaint  Patient presents with   Sleep Apnea   Hypertension    History of Present Illness:  Stephen Maldonado is a 68 y.o. male with a history of GERD, hypertension, morbid obesity, aortic stenosis status post TAVR and obstructive sleep apnea on CPAP therapy.  He has not been followed by sleep physician and is now referred for sleep medicine consultation to establish sleep care.  He had his initial sleep study in 2021 after a sleep study showed severe obstructive sleep apnea and severe nocturnal hypoxemia on a home sleep study.  He was titrated ultimately to BiPAP at 26/22 mmHg with supplemental O2 at 2 L.  When I saw him last he had not used his BiPAP in the past few weeks due to a URI.  When he had used his device he tolerated the under the nose full face mask but the mask broke and he needed to get a new one.  I ordered him a new mask and he is now back for follow-up.  He is doing well with hishe PAP device and thinks that he has gotten used to it.  He tolerates the under the nose full face mask and feels the pressure is adequate.  Since going on PAP he feels rested in the am and has no significant daytime sleepiness.  He denies any significant nasal dryness or nasal congestion.  He is having a lot of problems with mouth dryness but is running out of water before the morning. He does not think that he snores.     Past Medical History:  Diagnosis Date   Arthritis    knees   Bladder spasms    BPH (benign prostatic hyperplasia)    DVT (deep venous thrombosis) (HCC) ocyt 2016   left leg tx medically   GERD (gastroesophageal reflux disease)    History of colon polyps    hyperplastic 06-14-2010   Hydronephrosis    Hypertension    Obesity, morbid (HCC)    S/P TAVR (transcatheter aortic valve replacement)  01/15/2022   s/p TAVR with a 26 mm Edwards S3UR via the TF approach by Dr. Abel Hoe & Dr. Narciso Backers   Urinary retention     Past Surgical History:  Procedure Laterality Date   CARDIOVERSION N/A 08/14/2018   Procedure: CARDIOVERSION;  Surgeon: Cody Das, MD;  [facility]: [facility];  Service: Cardiovascular;  Laterality: N/A;   COLONOSCOPY  06/14/2010   CORONARY ANGIOGRAPHY N/A 07/11/2016   Procedure: Coronary Angiography;  Surgeon: Pasqual Bone, MD;  [facility]: [facility];  Service: Cardiovascular;  Laterality: N/A;   CORONARY PRESSURE/FFR STUDY N/A 07/14/2018   Procedure: INTRAVASCULAR PRESSURE WIRE/FFR STUDY;  Surgeon: Cody Das, MD;  Location: [facility];  Service: Cardiovascular;  Laterality: N/A;   CYSTOSCOPY W/ RETROGRADES Bilateral 10/03/2015   Procedure: CYSTOSCOPY WITH RETROGRADE PYELOGRAM;  Surgeon: Christina Coyer, MD;  Location: [facility];  Service: Urology;  Laterality: Bilateral;   GREEN LIGHT LASER TURP (TRANSURETHRAL RESECTION OF PROSTATE N/A 10/03/2015   Procedure: GREEN LIGHT LASER,  TURP - STAGED(TRANSURETHRAL RESECTION OF PROSTATE;  Surgeon: Christina Coyer, MD;  Location: [facility];  Service: Urology;  Laterality: N/A;   HAND SURGERY Left    INTRAOPERATIVE TRANSTHORACIC ECHOCARDIOGRAM N/A 01/15/2022  Procedure: INTRAOPERATIVE TRANSTHORACIC ECHOCARDIOGRAM;  Surgeon: Odie Benne, MD;  [facility]: [facility];  Service: Open Heart Surgery;  Laterality: N/A;   KNEE ARTHROSCOPY     not sure which knee   LEFT HEART CATH AND CORONARY ANGIOGRAPHY N/A 07/14/2018   Procedure: LEFT HEART CATH AND CORONARY ANGIOGRAPHY;  Surgeon: Cody Das, MD;  [facility];  Service: Cardiovascular;  Laterality: N/A;   QUADRICEPS TENDON REPAIR Bilateral left 02/24/2003/   right 2006   RIGHT/LEFT HEART CATH AND CORONARY ANGIOGRAPHY N/A 11/13/2021   Procedure: RIGHT/LEFT HEART CATH AND  CORONARY ANGIOGRAPHY;  Surgeon: Cody Das, MD;  [facility];  Service: Cardiovascular;  Laterality: N/A;   TOTAL KNEE ARTHROPLASTY Right 12/01/2015   Procedure: RIGHT TOTAL KNEE ARTHROPLASTY;  Surgeon: Arnie Lao, MD;  Location: [facility];  Service: Orthopedics;  Laterality: Right;   TOTAL KNEE ARTHROPLASTY Left 01/10/2017   Procedure: LEFT TOTAL KNEE ARTHROPLASTY;  Surgeon: Arnie Lao, MD;  Location: [facility];  Service: Orthopedics;  Laterality: Left;   TOTAL SHOULDER ARTHROPLASTY Right 01/23/2018   Procedure: RIGHT REVERSE TOTAL SHOULDER ARTHROPLASTY;  Surgeon: Jasmine Mesi, MD;  Location[facility];  Service: Orthopedics;  Laterality: Right;   TRANSCATHETER AORTIC VALVE REPLACEMENT, TRANSFEMORAL N/A 01/15/2022   Procedure: Transcatheter Aortic Valve Replacement, Transfemoral;  Surgeon: Odie Benne, MD;  Location: [facility];  Service: Open Heart Surgery;  Laterality: N/A;    Current Medications: No outpatient medications have been marked as taking for the 07/04/23 encounter (Office Visit) with Jacqueline Matsu, MD.    Allergies:   Atorvastatin , Aspirin , Simvastatin , and Statins   Social History   Socioeconomic History   Marital status: Married    Spouse name: Not on file   Number of children: 1   Years of education: Not on file   Highest education level: Not on file  Occupational History   Occupation: Equities trader for office equipment  Tobacco Use   Smoking status: Never   Smokeless tobacco: Former    Types: Chew    Quit date: 02/05/1968  Vaping Use   Vaping status: Never Used  Substance and Sexual Activity   Alcohol use: Yes    Comment: 1-2 beers per month   Drug use: No   Sexual activity: Not Currently  Other Topics Concern   Not on file  Social History Narrative   Not on file   Social Drivers of Health   Financial Resource Strain: Not on file  Food Insecurity: No Food Insecurity  (01/16/2022)   Hunger Vital Sign    Worried About Running Out of Food in the Last Year: Never true    Ran Out of Food in the Last Year: Never true  Transportation Needs: No Transportation Needs (01/16/2022)   PRAPARE - Administrator, Civil Service (Medical): No    Lack of Transportation (Non-Medical): No  Physical Activity: Not on file  Stress: Not on file  Social Connections: Unknown (06/19/2021)   Received from [facility]   Social Network    Social Network: Not on file     Family History:  The patient's family history includes Breast cancer in his mother; CAD in his father; Diabetes in his father and mother; Heart failure in his father; Lung cancer in his mother.   ROS:   Please see the history of present illness.    ROS All other systems reviewed and are negative.  No data to display             PHYSICAL EXAM:   VS:  There were no vitals taken for this visit.   GEN: Well nourished, well developed, in no acute distress  HEENT: normal  Neck: no JVD, carotid bruits, or masses Cardiac: RRR; no murmurs, rubs, or gallops,no edema.  Intact distal pulses bilaterally.  Respiratory:  clear to auscultation bilaterally, normal work of breathing GI: soft, nontender, nondistended, + BS MS: no deformity or atrophy  Skin: warm and dry, no rash Neuro:  Alert and Oriented x 3, Strength and sensation are intact Psych: euthymic mood, full affect  Wt Readings from Last 3 Encounters:  04/17/23 297 lb 6.4 oz (134.9 kg)  04/15/23 295 lb 3.2 oz (133.9 kg)  02/26/23 (!) 305 lb (138.3 kg)      Studies/Labs Reviewed:   Home sleep study, CPAP titration, PAP compliance download  Recent Labs: 07/17/2022: NT-Pro BNP 159    CHA2DS2-VASc Score = 3   This indicates a 3.2% annual risk of stroke. The patient's score is based upon: CHF History: 1 HTN History: 1 Diabetes History: 0 Stroke History: 0 Vascular Disease History: 0 Age Score: 1 Gender  Score: 0     Additional studies/ records that were reviewed today include:  none ASSESSMENT:    1. OSA (obstructive sleep apnea)   2. Essential hypertension       PLAN:  In order of problems listed above:  OSA - The patient is tolerating PAP therapy well without any problems. The PAP download performed by his DME was personally reviewed and interpreted by me today and showed an AHI of 3.1/hr on BiPAP at 25/21 cm H2O with 100% compliance in using more than 4 hours nightly.  The patient has been using and benefiting from PAP use and will continue to benefit from therapy.   Hypertension - BP borderline controlled on exam today - his BP was normal at his PCP office a month - Continue carvedilol  12.5 mg twice daily, hydralazine  25 mg twice daily, Imdur  30 mg daily with as needed refills  Time Spent: 20 minutes total time of encounter, including 15 minutes spent in face-to-face patient care on the date of this encounter. This time includes coordination of care and counseling regarding above mentioned problem list. Remainder of non-face-to-face time involved reviewing chart documents/testing relevant to the patient encounter and documentation in the medical record. I have independently reviewed documentation from referring provider  Medication Adjustments/Labs and Tests Ordered: Current medicines are reviewed at length with the patient today.  Concerns regarding medicines are outlined above.  Medication changes, Labs and Tests ordered today are listed in the Patient Instructions below.  There are no Patient Instructions on file for this visit.   Signed, Gaylyn Keas, MD  07/04/2023 12:37 PM    [facility] [address] [place], [place], [place]  [address] Phone: [phone]; Fax: [phone]

## 2023-07-04 NOTE — Patient Instructions (Signed)

## 2023-07-16 ENCOUNTER — Telehealth: Payer: Self-pay | Admitting: Cardiology

## 2023-07-16 DIAGNOSIS — I480 Paroxysmal atrial fibrillation: Secondary | ICD-10-CM

## 2023-07-16 MED ORDER — APIXABAN 5 MG PO TABS: 5.0000 mg | ORAL_TABLET | Freq: Two times a day (BID) | ORAL | 1 refills | Status: AC

## 2023-07-16 NOTE — Telephone Encounter (Signed)
 Spoke with Dr. Filiberto Hug and he advised for pt to take nitroglycerin  if CP comes back. If nitroglycerin  is not relieving CP, needs to go to ED. Advised pt of Dr. Silverio Drought recommendations. Pt verbalized understanding of plan. Asked pt to call our office to update us  tomorrow (6/12).

## 2023-07-16 NOTE — Telephone Encounter (Signed)
*  STAT* If patient is at the pharmacy, call can be transferred to refill team.   1. Which medications need to be refilled? (please list name of each medication and dose if known) apixaban  (ELIQUIS ) 5 MG TABS tablet    2. Would you like to learn more about the convenience, safety, & potential cost savings by using the [facility]?    3. Are you open to using the [facility] (Type [facility]. ).   4. Which pharmacy/location (including street and city if local pharmacy) is medication to be sent to? [id number] - [place], [place] - [address]    5. Do they need a 30 day or 90 day supply? 90 day

## 2023-07-16 NOTE — Telephone Encounter (Signed)
 Spoke with pt over the phone. He stated he experiencing N/V, intermittent CP, no appetite, dizziness. Denies jaw pain and new SOB. Pt states he always SOB to an extent. Pt states he has nitroglycerin  but hasn't taken any. Advised pt on when to take nitroglycerin . Explained to pt that I will send information to Dr. Filiberto Hug to review. BP readings below from today.   118/73  145/96 116/70  HR between 68-80

## 2023-07-16 NOTE — Telephone Encounter (Signed)
 Prescription refill request for Eliquis  received. Indication: PAF Last office visit: 04/17/23  Floyde Huxley MD Scr: 2.11 on 06/02/23  [facility] Age: 68 Weight: 134.9kg  Based on above findings Eliquis  5mg  twice daily is the appropriate dose.  Refill approved.

## 2023-07-16 NOTE — Telephone Encounter (Signed)
   Pt c/o of Chest Pain: STAT if active CP, including tightness, pressure, jaw pain, radiating pain to shoulder/upper arm/back, CP unrelieved by Nitro. Symptoms reported of SOB, nausea, vomiting, sweating.  1. Are you having CP right now? no    2. Are you experiencing any other symptoms (ex. SOB, nausea, vomiting, sweating)? Dizziness, no appetite, vomiting,headaches   3. Is your CP continuous or coming and going? Coming and going    4. Have you taken Nitroglycerin ? no   5. How long have you been experiencing CP? 4-5 days     6. If NO CP at time of call then end call with telling Pt to call back or call 911 if Chest pain returns prior to return call from triage team.

## 2023-07-17 NOTE — Telephone Encounter (Signed)
 Appointment made for 07/18/23. Pt agrees with plan of care.

## 2023-07-17 NOTE — Telephone Encounter (Signed)
 Noted. Happy to see for follow up 6/13 or any other day in future.  Thanks MJP

## 2023-07-17 NOTE — Telephone Encounter (Signed)
 Pt called back to report that he is no longer having chest pain. He states he still feels fatigue. Please advise.

## 2023-07-17 NOTE — Telephone Encounter (Signed)
 Patient states he is no longer having chest pain or neck pain. He states the pain discussed with nurse yesterday he would describe as not severe, a sharp twinge.   Patient states he feels fatigued, and his shoulders feel heavy as though he has done a lot of heavy lifting. He also reports he still feels dizzy occasionally when changing positions from sitting to standing.  He denies any headache. He states he is going to try to eat some breakfast to see how he tolerates that. Denies any noticeable swelling.  Will forward to Dr. Filiberto Hug to review.

## 2023-07-18 ENCOUNTER — Encounter: Payer: Self-pay | Admitting: Cardiology

## 2023-07-18 ENCOUNTER — Ambulatory Visit: Attending: Cardiology | Admitting: Cardiology

## 2023-07-18 ENCOUNTER — Ambulatory Visit: Payer: Self-pay | Admitting: Cardiology

## 2023-07-18 VITALS — BP 104/72 | HR 83 | Ht 71.0 in | Wt 278.6 lb

## 2023-07-18 DIAGNOSIS — R0609 Other forms of dyspnea: Secondary | ICD-10-CM | POA: Diagnosis not present

## 2023-07-18 DIAGNOSIS — R7989 Other specified abnormal findings of blood chemistry: Secondary | ICD-10-CM

## 2023-07-18 DIAGNOSIS — I10 Essential (primary) hypertension: Secondary | ICD-10-CM

## 2023-07-18 DIAGNOSIS — I25118 Atherosclerotic heart disease of native coronary artery with other forms of angina pectoris: Secondary | ICD-10-CM | POA: Diagnosis not present

## 2023-07-18 DIAGNOSIS — I480 Paroxysmal atrial fibrillation: Secondary | ICD-10-CM | POA: Diagnosis not present

## 2023-07-18 DIAGNOSIS — I25110 Atherosclerotic heart disease of native coronary artery with unstable angina pectoris: Secondary | ICD-10-CM

## 2023-07-18 LAB — LIPID PANEL

## 2023-07-18 LAB — TROPONIN T: Troponin T (Highly Sensitive): 59 ng/L — ABNORMAL HIGH (ref 0–22)

## 2023-07-18 MED ORDER — CLOPIDOGREL BISULFATE 75 MG PO TABS: 75.0000 mg | ORAL_TABLET | Freq: Every day | ORAL | 3 refills | Status: DC

## 2023-07-18 NOTE — Progress Notes (Signed)
 Cardiology Office Note:  .   Date:  07/18/2023  ID:  Stephen Maldonado, DOB 07-30-1955, MRN [id number] PCP: Benedetta Bradley, MD  [facility] Providers Cardiologist:  Fransico Ivy, MD PCP: Benedetta Bradley, MD  Chief Complaint  Patient presents with   Chest Pain      History of Present Illness: .    Stephen Maldonado is a 68 y.o. male with hypertension, hyperlipidemia, moderate CAD, type II DM, h/o NICM with recovered LVEF, PAF, moderate obesity, OSA, s/p TAVR for severe AS   Last few days, patient has had pains across his chest lasting for several minutes, improving with several nitroglycerin .  His breathing is improved after nearly 50 pound weight loss on Ozempic , leg edema is resolved.  Vitals:   07/18/23 1417  BP: 104/72  Pulse: 83  SpO2: 97%       ROS:  Review of Systems  Cardiovascular:  Positive for chest pain. Negative for dyspnea on exertion, leg swelling, palpitations and syncope.     Studies Reviewed: Stephen Maldonado        EKG 07/18/2023: Sinus rhythm with 1st degree A-V block with occasional Premature ventricular complexes Incomplete right bundle branch block Left posterior fascicular block Nonspecific ST abnormality Prolonged QT When compared with ECG of 17-Apr-2023 16:10, Premature ventricular complexes are now Present Left posterior fascicular block is now Present   Risk Assessment/Calculations:    CHA2DS2-VASc Score = 3  This indicates a 3.23.2% annual risk of stroke. The patient's score is based upon: CHF History: 1 HTN History: 1 Diabetes History: 0 Stroke History: 0 Vascular Disease History: 0 Age Score: 1 Gender Score: 0    Labs 10/2022-03/2023: Chol 173, TG 215, HDL 38, LDL 98 HbA1C 10.6% Hb 13.8 BUN 35, eGFR 33 K 4.7  Echocardiogram 01/2023: 1. Left ventricular ejection fraction, by estimation, is 55 to 60%. The  left ventricle has normal function. Left ventricular endocardial border  not optimally defined to evaluate  regional wall motion. There is mild left  ventricular hypertrophy. Left  ventricular diastolic parameters are consistent with Grade I diastolic  dysfunction (impaired relaxation). Elevated left atrial pressure.   2. Right ventricular systolic function is normal. The right ventricular  size is normal.   3. The mitral valve is normal in structure. No evidence of mitral valve  regurgitation. No evidence of mitral stenosis.   4. Edwards Sapien 3 THV size 26 mm is in the AV position. Mean gradient  11 mmHg is within normal published range and stable from Mar 06, 2022  study. . The aortic valve has been repaired/replaced. Aortic valve  regurgitation is not visualized. No aortic  stenosis is present.   5. The inferior vena cava is normal in size with greater than 50%  respiratory variability, suggesting right atrial pressure of 3 mmHg.   Physical Exam:   Physical Exam Vitals and nursing note reviewed.  Constitutional:      General: He is not in acute distress. Neck:     Vascular: No JVD.   Cardiovascular:     Rate and Rhythm: Normal rate and regular rhythm.     Heart sounds: Murmur heard.     Harsh midsystolic murmur is present with a grade of 2/6 at the upper right sternal border radiating to the neck.  Pulmonary:     Effort: Pulmonary effort is normal.     Breath sounds: Normal breath sounds. No wheezing or rales.   Musculoskeletal:     Right lower leg:  No edema.     Left lower leg: No edema.     VISIT DIAGNOSES:   ICD-10-CM   1. Coronary artery disease involving native coronary artery of native heart with unstable angina pectoris (HCC)  I25.110 CBC    Basic metabolic panel with GFR    Lipid panel    Pro b natriuretic peptide (BNP)    Troponin T    CANCELED: Troponin T    2. Paroxysmal atrial fibrillation (HCC)  I48.0 EKG 12-Lead    3. Essential hypertension  I10         ASSESSMENT AND PLAN: .    Stephen Maldonado is a 68 y.o. male with hypertension, hyperlipidemia,  moderate CAD, h/o NICM with recovered LVEF, PAF, moderate obesity, OSA, now s/p TAVR for severe AS   Chest pain: Known CAD noted on pre-TAVR cath in 2023, especially moderate to severe stenosis in mid left circumflex, mild to moderate disease in RCA. Although stress test in 2024 showed infarct without ischemia, his current symptoms with chest pain at rest resolving nitroglycerin  are concerning for unstable angina. I recommend coronary angiography with consideration for intervention.  Obviously, we have to be cautious given his renal function and CKD stage IIIb. I recommend plan cardiac authorization after additional hydration to reduce contrast-induced nephropathy.  I have reviewed the risks, indications, and alternatives to cardiac catheterization, possible angioplasty, and stenting with the patient. Risks include but are not limited to bleeding, infection, vascular injury, stroke, myocardial infection, arrhythmia, kidney injury, radiation-related injury in the case of prolonged fluoroscopy use, emergency cardiac surgery, and death. The patient understands the risks of serious complication is 1-2 in 1000 with diagnostic cardiac cath and 1-2% or less with angioplasty/stenting.   In the meantime, recommend antiplatelet therapy.  He reportedly has aspirin  allergy, therefore started on Plavix 75 mg daily. Continue carvedilol , Imdur , added sublingual nitroglycerin .  Will check labs including CBC, BMP, proBNP, lipid panel, high sensitive troponin.  Between now and cardiac catheterization next week, if he has any recurrent chest pain, strongly recommend calling 911 and going to emergency room immediately.  Aortic stenosis: S/p TAVR Edwards Sapien 3 THV (size 26 mm) (01/2022). Valve functioning well based on physical exam and echocardiogram 01/2023.   Paroxysmal Afib: He has been been off Eliquis  due to cost issues, but recently started again.   Continue eliquis  5 mg bid.  Okay to hold before  cardiac catheterization.   Essential hypertension: Controlled.   OSA on CPAP: Tolerating well  F/u after cardiac catheterization.  Signed, Cody Das, MD

## 2023-07-18 NOTE — Patient Instructions (Addendum)
 Medication Instructions:  Start Plavix 75mg  daily Continue current medications  Lab Work: CBC BMMP TROPONINS LIPID PANEL PROBNP  If you have labs (blood work) drawn today and your tests are completely normal, you will receive your results only by: MyChart Message (if you have MyChart) OR A paper copy in the mail If you have any lab test that is abnormal or we need to change your treatment, we will call you to review the results.  Testing/Procedures: LEFT HEART CATH   Follow-Up: At [facility], you and your health needs are our priority.  As part of our continuing mission to provide you with exceptional heart care, our providers are all part of one team.  This team includes your primary Cardiologist (physician) and Advanced Practice Providers or APPs (Physician Assistants and Nurse Practitioners) who all work together to provide you with the care you need, when you need it.  Your next appointment:   2 week(s) (08/06/23)  Provider:   Dr. Filiberto Hug   We recommend signing up for the patient portal called MyChart.  Sign up information is provided on this After Visit Summary.  MyChart is used to connect with patients for Virtual Visits (Telemedicine).  Patients are able to view lab/test results, encounter notes, upcoming appointments, etc.  Non-urgent messages can be sent to your provider as well.   To learn more about what you can do with MyChart, go to [email or web address].       Dear Stephen Maldonado  You are scheduled for a  left Cath on Wednesday, June 18 with Dr. Filiberto Hug.  Please arrive at the [facility] (Main Entrance A) at [facility]: [address] [place], [place] [address] at 10:30 (This time is 2 hour(s) before your procedure to ensure your preparation).   Free valet parking service is available. You will check in at ADMITTING.   *Please Note: You will receive a call the day before your procedure to confirm the appointment time. That time may have  changed from the original time based on the schedule for that day.*    DIET:  Nothing to eat or drink after midnight except a sip of water with medications (see medication instructions below)  MEDICATION INSTRUCTIONS:.  HOLD: Ozempic  for 1 weeks prior to the procedure. Last dose on Friday, June 06.2025 Hold Metformin for day of procedure and 48 hours after procedure.   Continue taking your anticoagulant (blood thinner): Do not take Apixaban  (Eliquis )  the day of your procedure, resume Eliquis  the day after your procedure unless your provider says otherwise. Take Plavix 75 mg morning of procedure You will need to continue this after your procedure until you are told by your provider that it is safe to stop.    LABS: Drawn today for cbc, lipid panel, BMP WITH GFR, BNP  FYI:  For your safety, and to allow us  to monitor your vital signs accurately during the surgery/procedure we request: If you have artificial nails, gel coating, SNS etc, please have those removed prior to your surgery/procedure. Not having the nail coverings /polish removed may result in cancellation or delay of your surgery/procedure.  Your support person will be asked to wait in the waiting room during your procedure.  It is OK to have someone drop you off and come back when you are ready to be discharged.  You cannot drive after the procedure and will need someone to drive you home.  Bring your insurance cards.  *Special Note: Every effort is made to  have your procedure done on time. Occasionally there are emergencies that occur at the hospital that may cause delays. Please be patient if a delay does occur.

## 2023-07-18 NOTE — Progress Notes (Signed)
 High-sensitivity troponin is mildly elevated suggesting some degree of heart injury.  This is not unexpected, but does not suggest massive heart attack if chest pain occurred couple of days ago.  In absence of any recurrent chest pain, it is reasonable to not go to the emergency room await heart catheterization next week.  If there is any recurrent chest pain between now and then, strongly recommend calling 911 and going to the emergency room immediately.  Thanks MJP

## 2023-07-19 LAB — CBC
Hematocrit: 49.7 % (ref 37.5–51.0)
Hemoglobin: 16.4 g/dL (ref 13.0–17.7)
MCH: 28.1 pg (ref 26.6–33.0)
MCHC: 33 g/dL (ref 31.5–35.7)
MCV: 85 fL (ref 79–97)
Platelets: 181 10*3/uL (ref 150–450)
RBC: 5.84 x10E6/uL — ABNORMAL HIGH (ref 4.14–5.80)
RDW: 14.3 % (ref 11.6–15.4)
WBC: 10.9 10*3/uL — ABNORMAL HIGH (ref 3.4–10.8)

## 2023-07-19 LAB — BASIC METABOLIC PANEL WITH GFR
BUN/Creatinine Ratio: 21 (ref 10–24)
BUN: 67 mg/dL — ABNORMAL HIGH (ref 8–27)
CO2: 24 mmol/L (ref 20–29)
Calcium: 10.2 mg/dL (ref 8.6–10.2)
Chloride: 83 mmol/L — ABNORMAL LOW (ref 96–106)
Creatinine, Ser: 3.21 mg/dL — ABNORMAL HIGH (ref 0.76–1.27)
Glucose: 138 mg/dL — ABNORMAL HIGH (ref 70–99)
Potassium: 3 mmol/L — ABNORMAL LOW (ref 3.5–5.2)
Sodium: 131 mmol/L — ABNORMAL LOW (ref 134–144)
eGFR: 20 mL/min/{1.73_m2} — ABNORMAL LOW (ref >59)

## 2023-07-19 LAB — LIPID PANEL
Cholesterol, Total: 205 mg/dL — ABNORMAL HIGH (ref 100–199)
HDL: 38 mg/dL — ABNORMAL LOW (ref >39)
LDL CALC COMMENT:: 5.4 ratio — ABNORMAL HIGH (ref 0.0–5.0)
LDL Chol Calc (NIH): 127 mg/dL — ABNORMAL HIGH (ref 0–99)
Triglycerides: 222 mg/dL — ABNORMAL HIGH (ref 0–149)
VLDL Cholesterol Cal: 40 mg/dL (ref 5–40)

## 2023-07-19 LAB — PRO B NATRIURETIC PEPTIDE: NT-Pro BNP: 200 pg/mL (ref 0–376)

## 2023-07-19 NOTE — Progress Notes (Signed)
 Cr has increased from 1.5 in 01/2022 to 3.2 now, eGFR 20. Renal function is too low to safely perform cardiac catheterization due to high risk of contrast induced nephropathy and risk of requiring dialysis. We will cancel the heart catheterization scheduled on 6/18 at this time. Patient sees Dr. Clevester Dally at [facility]. Let us  request a stat appt with them.  In case of any recurrent chest pain or severe shortness of breath, strongly recommend calling 911 and going to the ER.  Cody Das, MD

## 2023-07-19 NOTE — Progress Notes (Signed)
 I did convey the above findings and recommendations to the patient and his wife. We should contact Nephrology on Monday morning.  Thanks MJP

## 2023-07-21 DIAGNOSIS — I2510 Atherosclerotic heart disease of native coronary artery without angina pectoris: Secondary | ICD-10-CM | POA: Diagnosis not present

## 2023-07-21 DIAGNOSIS — N184 Chronic kidney disease, stage 4 (severe): Secondary | ICD-10-CM | POA: Diagnosis not present

## 2023-07-21 DIAGNOSIS — I480 Paroxysmal atrial fibrillation: Secondary | ICD-10-CM | POA: Diagnosis not present

## 2023-07-21 DIAGNOSIS — I129 Hypertensive chronic kidney disease with stage 1 through stage 4 chronic kidney disease, or unspecified chronic kidney disease: Secondary | ICD-10-CM | POA: Diagnosis not present

## 2023-07-21 DIAGNOSIS — N139 Obstructive and reflux uropathy, unspecified: Secondary | ICD-10-CM | POA: Diagnosis not present

## 2023-07-22 ENCOUNTER — Other Ambulatory Visit: Payer: Self-pay | Admitting: Nephrology

## 2023-07-22 ENCOUNTER — Telehealth: Payer: Self-pay

## 2023-07-22 ENCOUNTER — Encounter: Payer: Self-pay | Admitting: Nephrology

## 2023-07-22 DIAGNOSIS — N184 Chronic kidney disease, stage 4 (severe): Secondary | ICD-10-CM

## 2023-07-22 DIAGNOSIS — N189 Chronic kidney disease, unspecified: Secondary | ICD-10-CM

## 2023-07-22 NOTE — Telephone Encounter (Signed)
 Pt is unsure of what kind of reaction he had.

## 2023-07-22 NOTE — Telephone Encounter (Signed)
 Okay to restart Ozempic .  Continue Eliquis , this is to prevent Afib related stroke. Since patient has report Aspirin  allergy, I recommend Aspirin  to reduce heart attack risk. If no true allergy, can take Aspirin  81 instead.  Thanks MJP

## 2023-07-22 NOTE — Telephone Encounter (Signed)
 Contacted patient to reschedule upcoming appointment with Dr. Filiberto Hug. Pt states that he is confused about his medication. Pt is wanting to verify that he should be taking Plavix 75 mg and Eliquis  5 mg each day?  Pt would also like to know if he is okay to restart his Ozempic  since he is no longer have his cath at this time? Pt reports that he takes Ozempic  on Fridays.

## 2023-07-22 NOTE — Progress Notes (Signed)
Pt seen by nephrology.

## 2023-07-23 ENCOUNTER — Emergency Department (HOSPITAL_COMMUNITY)

## 2023-07-23 ENCOUNTER — Observation Stay (HOSPITAL_COMMUNITY)
Admission: EM | Admit: 2023-07-23 | Discharge: 2023-07-25 | Disposition: A | Discharge status: Home / Self Care | Attending: Internal Medicine | Admitting: Internal Medicine

## 2023-07-23 ENCOUNTER — Observation Stay (HOSPITAL_COMMUNITY)

## 2023-07-23 ENCOUNTER — Encounter (HOSPITAL_COMMUNITY): Admission: RE | Payer: Self-pay | Source: Home / Self Care

## 2023-07-23 ENCOUNTER — Other Ambulatory Visit: Payer: Self-pay

## 2023-07-23 ENCOUNTER — Other Ambulatory Visit

## 2023-07-23 ENCOUNTER — Ambulatory Visit (HOSPITAL_COMMUNITY): Admission: RE | Admit: 2023-07-23 | Source: Home / Self Care | Admitting: Cardiology

## 2023-07-23 DIAGNOSIS — Z6838 Body mass index (BMI) 38.0-38.9, adult: Secondary | ICD-10-CM | POA: Diagnosis not present

## 2023-07-23 DIAGNOSIS — I129 Hypertensive chronic kidney disease with stage 1 through stage 4 chronic kidney disease, or unspecified chronic kidney disease: Secondary | ICD-10-CM | POA: Diagnosis not present

## 2023-07-23 DIAGNOSIS — E871 Hypo-osmolality and hyponatremia: Secondary | ICD-10-CM | POA: Diagnosis not present

## 2023-07-23 DIAGNOSIS — E66812 Obesity, class 2: Secondary | ICD-10-CM | POA: Insufficient documentation

## 2023-07-23 DIAGNOSIS — E876 Hypokalemia: Secondary | ICD-10-CM | POA: Diagnosis not present

## 2023-07-23 DIAGNOSIS — N179 Acute kidney failure, unspecified: Secondary | ICD-10-CM | POA: Insufficient documentation

## 2023-07-23 DIAGNOSIS — E785 Hyperlipidemia, unspecified: Secondary | ICD-10-CM | POA: Insufficient documentation

## 2023-07-23 DIAGNOSIS — N261 Atrophy of kidney (terminal): Secondary | ICD-10-CM | POA: Diagnosis not present

## 2023-07-23 DIAGNOSIS — I5022 Chronic systolic (congestive) heart failure: Secondary | ICD-10-CM

## 2023-07-23 DIAGNOSIS — N184 Chronic kidney disease, stage 4 (severe): Secondary | ICD-10-CM | POA: Insufficient documentation

## 2023-07-23 DIAGNOSIS — I200 Unstable angina: Secondary | ICD-10-CM | POA: Diagnosis not present

## 2023-07-23 DIAGNOSIS — R42 Dizziness and giddiness: Secondary | ICD-10-CM

## 2023-07-23 DIAGNOSIS — I2089 Other forms of angina pectoris: Secondary | ICD-10-CM

## 2023-07-23 DIAGNOSIS — R079 Chest pain, unspecified: Secondary | ICD-10-CM | POA: Diagnosis not present

## 2023-07-23 DIAGNOSIS — I25110 Atherosclerotic heart disease of native coronary artery with unstable angina pectoris: Principal | ICD-10-CM | POA: Insufficient documentation

## 2023-07-23 DIAGNOSIS — N189 Chronic kidney disease, unspecified: Secondary | ICD-10-CM

## 2023-07-23 LAB — URINALYSIS, ROUTINE W REFLEX MICROSCOPIC
Bilirubin Urine: NEGATIVE
Glucose, UA: NEGATIVE mg/dL
Hgb urine dipstick: NEGATIVE
Ketones, ur: NEGATIVE mg/dL
Leukocytes,Ua: NEGATIVE
Nitrite: NEGATIVE
Protein, ur: NEGATIVE mg/dL
Specific Gravity, Urine: 1.008 (ref 1.005–1.030)
pH: 6 (ref 5.0–8.0)

## 2023-07-23 LAB — COMPREHENSIVE METABOLIC PANEL WITH GFR
ALT: 17 U/L (ref 0–44)
AST: 16 U/L (ref 15–41)
Albumin: 4 g/dL (ref 3.5–5.0)
Alkaline Phosphatase: 44 U/L (ref 38–126)
Anion gap: 17 — ABNORMAL HIGH (ref 5–15)
BUN: 71 mg/dL — ABNORMAL HIGH (ref 8–23)
CO2: 28 mmol/L (ref 22–32)
Calcium: 9.6 mg/dL (ref 8.9–10.3)
Chloride: 81 mmol/L — ABNORMAL LOW (ref 98–111)
Creatinine, Ser: 2.68 mg/dL — ABNORMAL HIGH (ref 0.61–1.24)
GFR, Estimated: 25 mL/min — ABNORMAL LOW (ref >60)
Glucose, Bld: 182 mg/dL — ABNORMAL HIGH (ref 70–99)
Potassium: 2.5 mmol/L — CL (ref 3.5–5.1)
Sodium: 126 mmol/L — ABNORMAL LOW (ref 135–145)
Total Bilirubin: 1.1 mg/dL (ref 0.0–1.2)
Total Protein: 8 g/dL (ref 6.5–8.1)

## 2023-07-23 LAB — CBC WITH DIFFERENTIAL/PLATELET
Abs Immature Granulocytes: 0.06 10*3/uL (ref 0.00–0.07)
Basophils Absolute: 0.1 10*3/uL (ref 0.0–0.1)
Basophils Relative: 0 %
Eosinophils Absolute: 0.1 10*3/uL (ref 0.0–0.5)
Eosinophils Relative: 1 %
HCT: 47.3 % (ref 39.0–52.0)
Hemoglobin: 16.2 g/dL (ref 13.0–17.0)
Immature Granulocytes: 1 %
Lymphocytes Relative: 26 %
Lymphs Abs: 3 10*3/uL (ref 0.7–4.0)
MCH: 27.9 pg (ref 26.0–34.0)
MCHC: 34.2 g/dL (ref 30.0–36.0)
MCV: 81.4 fL (ref 80.0–100.0)
Monocytes Absolute: 1 10*3/uL (ref 0.1–1.0)
Monocytes Relative: 9 %
Neutro Abs: 7.4 10*3/uL (ref 1.7–7.7)
Neutrophils Relative %: 63 %
Platelets: 199 10*3/uL (ref 150–400)
RBC: 5.81 MIL/uL (ref 4.22–5.81)
RDW: 13.2 % (ref 11.5–15.5)
WBC: 11.6 10*3/uL — ABNORMAL HIGH (ref 4.0–10.5)
nRBC: 0 % (ref 0.0–0.2)

## 2023-07-23 LAB — BRAIN NATRIURETIC PEPTIDE: B Natriuretic Peptide: 46.7 pg/mL (ref 0.0–100.0)

## 2023-07-23 LAB — TROPONIN I (HIGH SENSITIVITY): Troponin I (High Sensitivity): 26 ng/L — ABNORMAL HIGH (ref <18)

## 2023-07-23 SURGERY — LEFT HEART CATH AND CORONARY ANGIOGRAPHY
Anesthesia: LOCAL

## 2023-07-23 MED ORDER — POTASSIUM CHLORIDE CRYS ER 20 MEQ PO TBCR
40.0000 meq | EXTENDED_RELEASE_TABLET | Freq: Once | ORAL | Status: AC
  Administered 2023-07-23: 40 meq via ORAL
  Filled 2023-07-23: qty 2

## 2023-07-23 MED ORDER — TAMSULOSIN HCL 0.4 MG PO CAPS
0.4000 mg | ORAL_CAPSULE | Freq: Every day | ORAL | Status: DC
  Administered 2023-07-24 – 2023-07-25 (×2): 0.4 mg via ORAL
  Filled 2023-07-23 (×2): qty 1

## 2023-07-23 MED ORDER — ISOSORBIDE MONONITRATE ER 30 MG PO TB24
30.0000 mg | ORAL_TABLET | Freq: Every day | ORAL | Status: DC
  Administered 2023-07-23 – 2023-07-25 (×3): 30 mg via ORAL
  Filled 2023-07-23 (×3): qty 1

## 2023-07-23 MED ORDER — SODIUM CHLORIDE 0.9 % IV SOLN: INTRAVENOUS | Status: AC

## 2023-07-23 MED ORDER — APIXABAN 5 MG PO TABS
5.0000 mg | ORAL_TABLET | Freq: Two times a day (BID) | ORAL | Status: DC
  Administered 2023-07-23 – 2023-07-24 (×3): 5 mg via ORAL
  Filled 2023-07-23 (×3): qty 1

## 2023-07-23 NOTE — ED Provider Notes (Signed)
 [place] [facility] AT [facility] Provider Note   CSN: [id number] Arrival date & time: 07/23/23  1333     Patient presents with: Dizziness and Chest Pain   Stephen Maldonado is a 68 y.o. male.  He has known coronary disease A-fib TAVR CKD.  He has been having ongoing chest pain and dizziness and is supposed to be getting a cardiac catheterization.  Unfortunately his kidney function is not good and so they are working with the kidney doctors to get that settled before he can get his cath.  He has been experiencing worsening dizziness both lightheaded and spinning and talked to cardiology who recommended he come to the emergency department.  Currently no dizziness no chest pain.  He was on a blood thinner.  No significant shortness of breath at this time.  No urinary symptoms  {Add pertinent medical, surgical, social history, OB history to YBO:17510} The history is provided by the patient.  Dizziness Quality:  Lightheadedness and vertigo Severity:  Moderate Onset quality:  Gradual Timing:  Intermittent Progression:  Worsening Associated symptoms: chest pain and shortness of breath   Associated symptoms: no nausea, no vision changes and no vomiting   Chest Pain Associated symptoms: dizziness and shortness of breath   Associated symptoms: no abdominal pain, no fever, no nausea and no vomiting        Prior to Admission medications   Medication Sig Start Date End Date Taking? Authorizing Provider  allopurinol (ZYLOPRIM) 100 MG tablet Take 200 mg by mouth every morning. 12/23/21   [provider]  amoxicillin  (AMOXIL ) 500 MG tablet Take 4 tablets (2,000 mg total) by mouth as directed. 1 hour prior to dental work including cleanings 01/23/22   Ardia Kraft, PA-C  apixaban  (ELIQUIS ) 5 MG TABS tablet Take 1 tablet (5 mg total) by mouth 2 (two) times daily. 07/16/23   Patwardhan, Kaye Parsons, MD  carvedilol  (COREG ) 12.5 MG tablet Take 1 tablet (12.5 mg total)  by mouth 2 (two) times daily with a meal. 09/06/20   Patwardhan, Manish J, MD  Cholecalciferol  (VITAMIN D ) 50 MCG (2000 UT) tablet Take 2,000 Units by mouth daily.    [provider]  clopidogrel (PLAVIX) 75 MG tablet Take 1 tablet (75 mg total) by mouth daily. 07/18/23   Patwardhan, Kaye Parsons, MD  Coenzyme Q10 (COQ-10) 200 MG CAPS Take 200 mg by mouth daily.    [provider]  cyclobenzaprine (FLEXERIL) 10 MG tablet Take 10 mg by mouth 3 (three) times daily as needed for muscle spasms. 02/07/20   [provider]  diltiazem  (CARDIZEM  LA) 240 MG 24 hr tablet Take 240 mg by mouth daily.    [provider]  furosemide  (LASIX ) 80 MG tablet Take 1 tablet (80 mg total) by mouth 2 (two) times daily. 07/31/18   Patwardhan, Kaye Parsons, MD  Garlic  1000 MG CAPS Take 1,000 mg by mouth daily.    [provider]  hydrALAZINE  (APRESOLINE ) 25 MG tablet Take 25 mg by mouth 2 (two) times daily with a meal. 06/20/16   [provider]  isosorbide  mononitrate (IMDUR ) 30 MG 24 hr tablet Take 1 tablet (30 mg total) by mouth daily. 09/06/20 07/18/23  Patwardhan, Kaye Parsons, MD  Lecithin 400 MG CAPS Take 1 capsule by mouth daily.    [provider]  metFORMIN (GLUCOPHAGE) 500 MG tablet Take 1,000 mg by mouth 2 (two) times daily with a meal. 01/16/22   [provider]  metolazone  (  ZAROXOLYN ) 5 MG tablet Take 1 tablet (5 mg total) by mouth every other day. Take on Mondays, Wednesdays, and Fridays. 04/01/23   Patwardhan, Kaye Parsons, MD  naphazoline-pheniramine (ALLERGY EYE) 0.025-0.3 % ophthalmic solution Place 1 drop into both eyes 4 (four) times daily as needed for eye irritation.    [provider]  Omega-3 Fatty Acids (FISH OIL) 1000 MG CAPS Take 1,000 mg by mouth daily.     [provider]  Potassium Chloride  ER 20 MEQ TBCR Take 20 mEq by mouth 2 (two) times daily. with food 07/06/18   [provider]  Semaglutide ,0.25 or 0.5MG /DOS, (OZEMPIC ,  0.25 OR 0.5 MG/DOSE,) 2 MG/3ML SOPN 0.25 mg. 06/19/23   [provider]  tamsulosin  (FLOMAX ) 0.4 MG CAPS capsule Take 0.4 mg by mouth every morning.    [provider]  Thiamine HCl (VITAMIN B-1 PO) Take 1 capsule by mouth daily at 6 (six) AM.    [provider]  Turmeric 500 MG TABS Take 500 mg by mouth daily.    [provider]  vitamin C  (ASCORBIC ACID ) 500 MG tablet Take 500 mg by mouth daily.     [provider]    Allergies: Atorvastatin , Aspirin , Simvastatin , and Statins    Review of Systems  Constitutional:  Negative for fever.  Eyes:  Negative for visual disturbance.  Respiratory:  Positive for shortness of breath.   Cardiovascular:  Positive for chest pain.  Gastrointestinal:  Negative for abdominal pain, nausea and vomiting.  Neurological:  Positive for dizziness.    Updated Vital Signs BP 92/68 (BP Location: Right Arm)   Pulse 79   Temp 98 F (36.7 C)   Resp 18   SpO2 99%   Physical Exam Vitals and nursing note reviewed.  Constitutional:      General: He is not in acute distress.    Appearance: He is well-developed.  HENT:     Head: Normocephalic and atraumatic.   Eyes:     Conjunctiva/sclera: Conjunctivae normal.    Cardiovascular:     Rate and Rhythm: Normal rate and regular rhythm.     Heart sounds: Normal heart sounds. No murmur heard. Pulmonary:     Effort: Pulmonary effort is normal. No respiratory distress.     Breath sounds: Normal breath sounds.  Abdominal:     Palpations: Abdomen is soft.     Tenderness: There is no abdominal tenderness.   Musculoskeletal:        General: No swelling.     Cervical back: Neck supple.     Right lower leg: No tenderness.     Left lower leg: No tenderness.   Skin:    General: Skin is warm and dry.     Capillary Refill: Capillary refill takes less than 2 seconds.   Neurological:     General: No focal deficit present.     Mental Status: He is alert.     (all  labs ordered are listed, but only abnormal results are displayed) Labs Reviewed  CBC WITH DIFFERENTIAL/PLATELET - Abnormal; Notable for the following components:      Result Value   WBC 11.6 (*)    All other components within normal limits  COMPREHENSIVE METABOLIC PANEL WITH GFR  BRAIN NATRIURETIC PEPTIDE  URINALYSIS, ROUTINE W REFLEX MICROSCOPIC  TROPONIN I (HIGH SENSITIVITY)  TROPONIN I (HIGH SENSITIVITY)    EKG: EKG Interpretation Date/Time:  Wednesday July 23 2023 14:07:49 EDT Ventricular Rate:  78 PR Interval:  220 QRS Duration:  104 QT Interval:  440 QTC Calculation: 501 R Axis:   84  Text Interpretation: Sinus rhythm with 1st degree A-V block Incomplete right bundle branch block Nonspecific ST abnormality Prolonged QT Abnormal ECG When compared with ECG of 18-Jul-2023 14:23, PREVIOUS ECG IS PRESENT Confirmed by Dorenda Gandy [phone]) on 07/23/2023 2:43:21 PM  Radiology: No results found.  {Document cardiac monitor, telemetry assessment procedure when appropriate:32947} Procedures   Medications Ordered in the ED - No data to display  Clinical Course as of 07/23/23 1744  Wed Jul 23, 2023  1537 Chest x-ray interpreted by me as no gross infiltrates.  Awaiting radiology interpretation [MB]  1546 Discussed with Trish from cardiology.  They will consult on the patient but they will not be primary admitting team. [MB]  1555 Discussed with Dr. Christianne Cowper from nephrology who agrees with getting a renal ultrasound and nephrology will consult on patient. [MB]  1704 ECG not crossing in epic.  Sinus rhythm with first-degree block, incomplete right bundle branch block, no significant change from prior.  Labs coming back with critically low potassium of 2.5.  Creatinine improved from most recent.  Have ordered him some oral potassium and added on a magnesium . [MB]  1720 Discussed with [facility] Dr Ned Balint who will see for admission.  [MB]    Clinical Course User Index [MB] Tonya Fredrickson,  MD   {Click here for ABCD2, HEART and other calculators REFRESH Note before signing:1}                              Medical Decision Making Amount and/or Complexity of Data Reviewed Labs: ordered. Radiology: ordered.  Risk Prescription drug management. Decision regarding hospitalization.   This patient complains of ***; this involves an extensive number of treatment Options and is a complaint that carries with it a high risk of complications and morbidity. The differential includes ***  I ordered, reviewed and interpreted labs, which included *** I ordered medication *** and reviewed PMP when indicated. I ordered imaging studies which included *** and I independently    visualized and interpreted imaging which showed *** Additional history obtained from *** Previous records obtained and reviewed *** I consulted *** and discussed lab and imaging findings and discussed disposition.  Cardiac monitoring reviewed, *** Social determinants considered, *** Critical Interventions: ***  After the interventions stated above, I reevaluated the patient and found *** Admission and further testing considered, ***   {Document critical care time when appropriate  Document review of labs and clinical decision tools ie CHADS2VASC2, etc  Document your independent review of radiology images and any outside records  Document your discussion with family members, caretakers and with consultants  Document social determinants of health affecting pt's care  Document your decision making why or why not admission, treatments were needed:32947:::1}   Final diagnoses:  None    ED Discharge Orders     None

## 2023-07-23 NOTE — Telephone Encounter (Signed)
 Spoke to patient and wife advising information from Dr. Filiberto Hug. Pt further reports that symptoms have worsened today with lightheadedness. Wife states that over the last few days pt had felt better and then today pt feels increasingly worse with significant lightheadedness. Contacted Dr. Filiberto Hug and advised information. Per provider, pt advised to go to emergency room. After speaking for period of time with patient and wife, pt has agreed to go to the emergency room. Pt advised to hold Eliquis , Ozempic  and Metformin incase needing heart cath. Pt will take Plavix and other medications as prescribed before going to emergency room. Dr. Filiberto Hug aware.

## 2023-07-23 NOTE — ED Notes (Signed)
 Patient transported to [place]

## 2023-07-23 NOTE — ED Provider Triage Note (Signed)
 Emergency Medicine Provider Triage Evaluation Note  Stephen Maldonado , a 68 y.o. male  was evaluated in triage.  Pt complains of chest pain.  Review of Systems  Positive: CP, SOB, DIzzy, unwell Negative: Fever, cough  Physical Exam  BP 92/68 (BP Location: Right Arm)   Pulse 79   Temp 98 F (36.7 C)   Resp 18   SpO2 99%  Gen:   Awake, no distress   Resp:  Normal effort  MSK:   Moves extremities without difficulty  Other:    Medical Decision Making  Medically screening exam initiated at 2:12 PM.  Appropriate orders placed.  [name] was informed that the remainder of the evaluation will be completed by another provider, this initial triage assessment does not replace that evaluation, and the importance of remaining in the ED until their evaluation is complete.  Here with pain on and off for one month. Followed by Dr. Michela Aguas, cards. He reports he was due for cath, stents but has renal disease.   I touched based with Dr. Michela Aguas who advises to eval/test as usual. Likely has obstructive heart disease but because of multiple medical issues, will likely need admission through medicine and they will consult.  No pain at present.    Mandy Second, PA-C 07/23/23 1415

## 2023-07-23 NOTE — ED Triage Notes (Signed)
 Pt./wife, stated, he has had dizziness with chest pain. He was scheduled for a stent but kidney function was off so he went to kidney Dr. This dizziness has been going on for a month and worse with exertion.

## 2023-07-23 NOTE — ED Notes (Signed)
 Lunch bag provided per RN order. Patient made aware of NPO order after midnight.

## 2023-07-23 NOTE — Telephone Encounter (Signed)
 Let us  use Plavix and Eliquis  without Aspirin . Bleeding risk may actually lower with Plavix than Aspirin , when used with Eliquis .  Thanks MJP

## 2023-07-23 NOTE — H&P (Signed)
 History and Physical    Stephen Maldonado:[id number] DOB: 1955/11/30 DOA: 07/23/2023  I have briefly reviewed the patient's prior medical records in [facility]  PCP: Benedetta Bradley, MD  Patient coming from: home  Chief Complaint: Generalized weakness, chest pain  HPI: Stephen Maldonado is a 68 y.o. male with medical history significant of AAS status post TAVR, chronic kidney disease, prior DVT, HTN, obesity, BPH, known CAD with a positive stress test June 2024 who is supposed to get the cardiac cath as an outpatient comes into the hospital with an episode of chest pain most recent a week ago, relieved with sublingual nitroglycerin  as well as increased weakness and overall fatigue.  This was associated with couple episodes of nausea.  Even coming to the ER and standing up from his car became very dizzy and weak.  He spoke today with cardiologist, given persistent symptoms, need for cardiac cath, underlying renal failure he was directed to the ER.  On my evaluation he does not have any chest pain, no nausea or vomiting.  He denies any lower extremity swelling.  He reports compliance with his home medications  ED Course: In the emergency room initial blood pressure is soft in the 90s systolic, but on repeat is 120 systolic.  He is afebrile, in sinus rhythm with rates in the 60s-70s.  He is satting well on room air.  Blood work reveals a sodium of 126, potassium of 2.5, creatinine 2.68 and a BUN of 71.  BNP is unremarkable 46.7, high-sensitivity troponin is 26.  CBC is unremarkable except for mild elevation in the WBC of 11.6.  Urinalysis is negative, and a chest x-ray did not show any acute cardiopulmonary disease.  EKG shows sinus rhythm.  Cardiology and nephrology were consulted by EDP, and we are asked to admit  Review of Systems: All systems reviewed, and apart from HPI, all negative  Past Medical History:  Diagnosis Date   Arthritis    knees   Bladder spasms    BPH (benign  prostatic hyperplasia)    DVT (deep venous thrombosis) (HCC) ocyt 2016   left leg tx medically   GERD (gastroesophageal reflux disease)    History of colon polyps    hyperplastic 06-14-2010   Hydronephrosis    Hypertension    Obesity, morbid (HCC)    S/P TAVR (transcatheter aortic valve replacement) 01/15/2022   s/p TAVR with a 26 mm Edwards S3UR via the TF approach by Dr. Abel Hoe & Dr. Narciso Backers   Urinary retention     Past Surgical History:  Procedure Laterality Date   CARDIOVERSION N/A 08/14/2018   Procedure: CARDIOVERSION;  Surgeon: Cody Das, MD;  Location: [facility];  Service: Cardiovascular;  Laterality: N/A;   COLONOSCOPY  06/14/2010   CORONARY ANGIOGRAPHY N/A 07/11/2016   Procedure: Coronary Angiography;  Surgeon: Pasqual Bone, MD;  Location: [facility];  Service: Cardiovascular;  Laterality: N/A;   CORONARY PRESSURE/FFR STUDY N/A 07/14/2018   Procedure: INTRAVASCULAR PRESSURE WIRE/FFR STUDY;  Surgeon: Cody Das, MD;  Location: [facility];  Service: Cardiovascular;  Laterality: N/A;   CYSTOSCOPY W/ RETROGRADES Bilateral 10/03/2015   Procedure: CYSTOSCOPY WITH RETROGRADE PYELOGRAM;  Surgeon: Christina Coyer, MD;  Location: [facility];  Service: Urology;  Laterality: Bilateral;   GREEN LIGHT LASER TURP (TRANSURETHRAL RESECTION OF PROSTATE N/A 10/03/2015   Procedure: GREEN LIGHT LASER,  TURP - STAGED(TRANSURETHRAL RESECTION OF PROSTATE;  Surgeon: Christina Coyer, MD;  Location: [facility];  Service: Urology;  Laterality: N/A;   HAND SURGERY Left    INTRAOPERATIVE TRANSTHORACIC ECHOCARDIOGRAM N/A 01/15/2022   Procedure: INTRAOPERATIVE TRANSTHORACIC ECHOCARDIOGRAM;  Surgeon: Odie Benne, MD;  Location: [facility];  Service: Open Heart Surgery;  Laterality: N/A;   KNEE ARTHROSCOPY     not sure which knee   LEFT HEART CATH AND CORONARY ANGIOGRAPHY N/A 07/14/2018   Procedure: LEFT HEART CATH  AND CORONARY ANGIOGRAPHY;  Surgeon: Cody Das, MD;  Location: [facility];  Service: Cardiovascular;  Laterality: N/A;   QUADRICEPS TENDON REPAIR Bilateral left 02/24/2003/   right 2006   RIGHT/LEFT HEART CATH AND CORONARY ANGIOGRAPHY N/A 11/13/2021   Procedure: RIGHT/LEFT HEART CATH AND CORONARY ANGIOGRAPHY;  Surgeon: Cody Das, MD;  Location: [facility];  Service: Cardiovascular;  Laterality: N/A;   TOTAL KNEE ARTHROPLASTY Right 12/01/2015   Procedure: RIGHT TOTAL KNEE ARTHROPLASTY;  Surgeon: Arnie Lao, MD;  Location: [facility];  Service: Orthopedics;  Laterality: Right;   TOTAL KNEE ARTHROPLASTY Left 01/10/2017   Procedure: LEFT TOTAL KNEE ARTHROPLASTY;  Surgeon: Arnie Lao, MD;  Location: [facility];  Service: Orthopedics;  Laterality: Left;   TOTAL SHOULDER ARTHROPLASTY Right 01/23/2018   Procedure: RIGHT REVERSE TOTAL SHOULDER ARTHROPLASTY;  Surgeon: Jasmine Mesi, MD;  Location: [facility];  Service: Orthopedics;  Laterality: Right;   TRANSCATHETER AORTIC VALVE REPLACEMENT, TRANSFEMORAL N/A 01/15/2022   Procedure: Transcatheter Aortic Valve Replacement, Transfemoral;  Surgeon: Odie Benne, MD;  Location: [facility];  Service: Open Heart Surgery;  Laterality: N/A;     reports that he has never smoked. He quit smokeless tobacco use about 55 years ago.  His smokeless tobacco use included chew. He reports current alcohol use. He reports that he does not use drugs.  Allergies  Allergen Reactions   Atorvastatin  Other (See Comments)    Muscle fatigue, soreness and dreams crazy Other reaction(s): severe arthralgias/myalgias Other reaction(s): severe arthralgias/myalgias   Aspirin  Other (See Comments)    Avoid to kidney function Other reaction(s): **Reduced Kidney Function Avoid to kidney function  Other reaction(s): **Reduced Kidney Function   Simvastatin  Other (See Comments)    Fatigue, soreness   Statins Other  (See Comments)    Fatigue, soreness    Family History  Problem Relation Age of Onset   Lung cancer Mother    Breast cancer Mother    Diabetes Mother    Heart failure Father    CAD Father    Diabetes Father     Prior to Admission medications   Medication Sig Start Date End Date Taking? Authorizing Provider  allopurinol (ZYLOPRIM) 100 MG tablet Take 200 mg by mouth every morning. 12/23/21   [provider]  amoxicillin  (AMOXIL ) 500 MG tablet Take 4 tablets (2,000 mg total) by mouth as directed. 1 hour prior to dental work including cleanings 01/23/22   Ardia Kraft, PA-C  apixaban  (ELIQUIS ) 5 MG TABS tablet Take 1 tablet (5 mg total) by mouth 2 (two) times daily. 07/16/23   Patwardhan, Kaye Parsons, MD  carvedilol  (COREG ) 12.5 MG tablet Take 1 tablet (12.5 mg total) by mouth 2 (two) times daily with a meal. 09/06/20   Patwardhan, Manish J, MD  Cholecalciferol  (VITAMIN D ) 50 MCG (2000 UT) tablet Take 2,000 Units by mouth daily.    [provider]  clopidogrel (PLAVIX) 75 MG tablet Take 1 tablet (75 mg total) by mouth daily. 07/18/23   Patwardhan, Manish J,  MD  Coenzyme Q10 (COQ-10) 200 MG CAPS Take 200 mg by mouth daily.    [provider]  cyclobenzaprine (FLEXERIL) 10 MG tablet Take 10 mg by mouth 3 (three) times daily as needed for muscle spasms. 02/07/20   [provider]  diltiazem  (CARDIZEM  LA) 240 MG 24 hr tablet Take 240 mg by mouth daily.    [provider]  furosemide  (LASIX ) 80 MG tablet Take 1 tablet (80 mg total) by mouth 2 (two) times daily. 07/31/18   Patwardhan, Manish J, MD  Garlic  1000 MG CAPS Take 1,000 mg by mouth daily.    [provider]  hydrALAZINE  (APRESOLINE ) 25 MG tablet Take 25 mg by mouth 2 (two) times daily with a meal. 06/20/16   [provider]  isosorbide  mononitrate (IMDUR ) 30 MG 24 hr tablet Take 1 tablet (30 mg total) by mouth daily. 09/06/20 07/18/23  Patwardhan, Kaye Parsons, MD  Lecithin 400 MG CAPS  Take 1 capsule by mouth daily.    [provider]  metFORMIN (GLUCOPHAGE) 500 MG tablet Take 1,000 mg by mouth 2 (two) times daily with a meal. 01/16/22   [provider]  metolazone  (ZAROXOLYN ) 5 MG tablet Take 1 tablet (5 mg total) by mouth every other day. Take on Mondays, Wednesdays, and Fridays. 04/01/23   Patwardhan, Kaye Parsons, MD  naphazoline-pheniramine (ALLERGY EYE) 0.025-0.3 % ophthalmic solution Place 1 drop into both eyes 4 (four) times daily as needed for eye irritation.    [provider]  Omega-3 Fatty Acids (FISH OIL) 1000 MG CAPS Take 1,000 mg by mouth daily.     [provider]  Potassium Chloride  ER 20 MEQ TBCR Take 20 mEq by mouth 2 (two) times daily. with food 07/06/18   [provider]  Semaglutide ,0.25 or 0.5MG /DOS, (OZEMPIC , 0.25 OR 0.5 MG/DOSE,) 2 MG/3ML SOPN 0.25 mg. 06/19/23   [provider]  tamsulosin  (FLOMAX ) 0.4 MG CAPS capsule Take 0.4 mg by mouth every morning.    [provider]  Thiamine HCl (VITAMIN B-1 PO) Take 1 capsule by mouth daily at 6 (six) AM.    [provider]  Turmeric 500 MG TABS Take 500 mg by mouth daily.    [provider]  vitamin C  (ASCORBIC ACID ) 500 MG tablet Take 500 mg by mouth daily.     [provider]    Physical Exam: Vitals:   07/23/23 1403 07/23/23 1535 07/23/23 1700  BP: 92/68 111/75 120/85  Pulse: 79 71 72  Resp: 18 15 17   Temp: 98 F (36.7 C)    SpO2: 99% 100% 100%    Constitutional: NAD, calm, comfortable Eyes: PERRL, lids and conjunctivae normal ENMT: Mucous membranes are moist.  Neck: normal, supple Respiratory: clear to auscultation bilaterally, no wheezing, no crackles.  Cardiovascular: Regular rate and rhythm, no murmurs / rubs / gallops.  Base edema Abdomen: no tenderness, no masses palpated. Bowel sounds positive.  Musculoskeletal: no clubbing / cyanosis. Normal muscle tone.  Skin: no rashes, lesions, ulcers. No  induration Neurologic: Nonfocal, alert and oriented x 4  Labs on Admission: I have personally reviewed following labs and imaging studies  CBC: Recent Labs  Lab 07/18/23 1553 07/23/23 1540  WBC 10.9* 11.6*  NEUTROABS  --  7.4  HGB 16.4 16.2  HCT 49.7 47.3  MCV 85 81.4  PLT 181 199   Basic Metabolic Panel: Recent Labs  Lab 07/18/23 1553 07/23/23 1540  NA 131* 126*  K 3.0* 2.5*  CL 83*  81*  CO2 24 28  GLUCOSE 138* 182*  BUN 67* 71*  CREATININE 3.21* 2.68*  CALCIUM  10.2 9.6   Liver Function Tests: Recent Labs  Lab 07/23/23 1540  AST 16  ALT 17  ALKPHOS 44  BILITOT 1.1  PROT 8.0  ALBUMIN 4.0   Coagulation Profile: No results for input(s): INR, PROTIME in the last 168 hours. BNP (last 3 results) Recent Labs    07/18/23 1553  PROBNP 200   CBG: No results for input(s): GLUCAP in the last 168 hours. Thyroid  Function Tests: No results for input(s): TSH, T4TOTAL, FREET4, T3FREE, THYROIDAB in the last 72 hours. Urine analysis:    Component Value Date/Time   COLORURINE YELLOW 07/23/2023 1540   APPEARANCEUR CLEAR 07/23/2023 1540   LABSPEC 1.008 07/23/2023 1540   PHURINE 6.0 07/23/2023 1540   GLUCOSEU NEGATIVE 07/23/2023 1540   HGBUR NEGATIVE 07/23/2023 1540   BILIRUBINUR NEGATIVE 07/23/2023 1540   KETONESUR NEGATIVE 07/23/2023 1540   PROTEINUR NEGATIVE 07/23/2023 1540   NITRITE NEGATIVE 07/23/2023 1540   LEUKOCYTESUR NEGATIVE 07/23/2023 1540     Radiological Exams on Admission: DG Chest 2 View Result Date: 07/23/2023 CLINICAL DATA:  Chest pain EXAM: CHEST - 2 VIEW COMPARISON:  03/10/2023 FINDINGS: TAVR. No acute airspace disease, pleural effusion or pneumothorax. Stable cardiomediastinal silhouette IMPRESSION: No active cardiopulmonary disease. Electronically Signed   By: Esmeralda Hedge M.D.   On: 07/23/2023 15:56   Assessment/Plan Principal problem Chest pain, concern for underlying CAD -patient will be admitted to the hospital with an  episode of chest pain last week, relieved by sublingual nitroglycerin  and increased dizziness in the last week.  Has been having couple episodes of nausea as well.  He was supposed to get a cardiac cath as an outpatient but this has been postponed due to his renal function. - Currently he is without chest pain, troponin is fairly low.  Cardiology consulted, appreciate input -Continue home Imdur .  Resume Eliquis  and Plavix once med rec done  Active problems Hyponatremia -suspect he is on the dry side given use of furosemide , given his hypokalemia as well.  Obtain orthostatic vital signs, hold furosemide , provide gentle fluids for few hours to avoid fluid overload  Hypokalemia-due to diuretics, replace potassium and continue to monitor.  Check magnesium  in the morning  Dizziness, weakness -possibly in the setting of hypokalemia, I wonder whether he is dry.  Obtain orthostatic vital signs  Essential hypertension-initial blood pressure was soft in the 90s, hold home Coreg , diltiazem , hydralazine   Hyperlipidemia -seems to have intolerance to statin  Obesity, class II-BMI 38, he would benefit from weight loss  Chronic kidney disease stage IV -prior creatinines back in 2023 were in the 1.5-1.6 range.  Most recent outpatient creatinine earlier this month at 3.2, currently 2.6.  I suspect his baseline is in the low twos and may not be that far off.  Obtain renal ultrasound.  Closely monitor   DVT prophylaxis: Eliquis   Code Status: Full code  Family Communication: wife at bedside  Disposition Plan: home when ready  Bed Type: Cardiac tele Consults called: cardiology, nephrology (Dr Christianne Cowper)  Obs/Inp: obs  Kathlen Para, MD, PhD [facility]  Contact via [email or web address]  07/23/2023, 5:41 PM

## 2023-07-23 NOTE — Consult Note (Signed)
 Cardiology Consultation   Patient ID: Stephen Maldonado MRN: [id number]; DOB: 07-Jul-1955  Admit date: 07/23/2023 Date of Consult: 07/23/2023  PCP:  Benedetta Bradley, MD   [facility] Providers Cardiologist:  Cody Das, MD         History of Present Illness: Mr. Kunath  is a 68 year old with aortic stenosis status post TAVR and coronary artery disease who presents with dizziness and chest pain. He is accompanied by his wife; Consulted by Dr. Ola Berger.    He has been experiencing severe dizziness for the past two weeks, which has led to episodes of vomiting. He also notes muscle fatigue in his shoulders and neck. Despite a recent episode of isolated chest pain, he currently reports no chest pain.  He has a history of aortic stenosis and underwent a TAVR procedure in December 2023. He also has a history of coronary artery disease, including a prior heart catheterization that showed a circumflex lesion and nonobstructive RCA disease. A heart catheterization was previously postponed due to acute kidney injury (see 07/18/23 note). He is not currently on any statin therapy due to intolerance.  He has a history of atrial fibrillation and is on Eliquis  for anticoagulation. He also has chronic kidney disease, with a recent creatinine level of 2.68, down from 3.2. He is allergic to aspirin  and is on Plavix. His sodium level was noted to be low at 126, and his potassium level is 2.5, indicating hypokalemia.  He sees Dr. Austine Blunt who has been cautiously optimistic about his kidney function stabilizing.  He has been experiencing shortness of breath. He has a history of obesity and diabetes, for which he recently started Ozempic  five weeks ago. He reports a significant weight loss from 334 pounds to 275 pounds. His blood sugars have been well controlled recently.   Past Medical History:  Diagnosis Date   Arthritis    knees   Bladder spasms    BPH (benign prostatic hyperplasia)     DVT (deep venous thrombosis) (HCC) ocyt 2016   left leg tx medically   GERD (gastroesophageal reflux disease)    History of colon polyps    hyperplastic 06-14-2010   Hydronephrosis    Hypertension    Obesity, morbid (HCC)    S/P TAVR (transcatheter aortic valve replacement) 01/15/2022   s/p TAVR with a 26 mm Edwards S3UR via the TF approach by Dr. Abel Hoe & Dr. Narciso Backers   Urinary retention     Past Surgical History:  Procedure Laterality Date   CARDIOVERSION N/A 08/14/2018   Procedure: CARDIOVERSION;  Surgeon: Cody Das, MD;  Location: [facility];  Service: Cardiovascular;  Laterality: N/A;   COLONOSCOPY  06/14/2010   CORONARY ANGIOGRAPHY N/A 07/11/2016   Procedure: Coronary Angiography;  Surgeon: Pasqual Bone, MD;  Location: [facility];  Service: Cardiovascular;  Laterality: N/A;   CORONARY PRESSURE/FFR STUDY N/A 07/14/2018   Procedure: INTRAVASCULAR PRESSURE WIRE/FFR STUDY;  Surgeon: Cody Das, MD;  Location: [facility];  Service: Cardiovascular;  Laterality: N/A;   CYSTOSCOPY W/ RETROGRADES Bilateral 10/03/2015   Procedure: CYSTOSCOPY WITH RETROGRADE PYELOGRAM;  Surgeon: Christina Coyer, MD;  Location: [facility];  Service: Urology;  Laterality: Bilateral;   GREEN LIGHT LASER TURP (TRANSURETHRAL RESECTION OF PROSTATE N/A 10/03/2015   Procedure: GREEN LIGHT LASER,  TURP - STAGED(TRANSURETHRAL RESECTION OF PROSTATE;  Surgeon: Christina Coyer, MD;  Location: [facility];  Service: Urology;  Laterality: N/A;   HAND SURGERY Left  INTRAOPERATIVE TRANSTHORACIC ECHOCARDIOGRAM N/A 01/15/2022   Procedure: INTRAOPERATIVE TRANSTHORACIC ECHOCARDIOGRAM;  Surgeon: Odie Benne, MD;  [facility]: [facility];  Service: Open Heart Surgery;  Laterality: N/A;   KNEE ARTHROSCOPY     not sure which knee   LEFT HEART CATH AND CORONARY ANGIOGRAPHY N/A 07/14/2018   Procedure: LEFT HEART CATH AND CORONARY ANGIOGRAPHY;   Surgeon: Cody Das, MD;  [facility]: [facility];  Service: Cardiovascular;  Laterality: N/A;   QUADRICEPS TENDON REPAIR Bilateral left 02/24/2003/   right 2006   RIGHT/LEFT HEART CATH AND CORONARY ANGIOGRAPHY N/A 11/13/2021   Procedure: RIGHT/LEFT HEART CATH AND CORONARY ANGIOGRAPHY;  Surgeon: Cody Das, MD;  [facility]: [facility];  Service: Cardiovascular;  Laterality: N/A;   TOTAL KNEE ARTHROPLASTY Right 12/01/2015   Procedure: RIGHT TOTAL KNEE ARTHROPLASTY;  Surgeon: Arnie Lao, MD;  Location: [facility];  Service: Orthopedics;  Laterality: Right;   TOTAL KNEE ARTHROPLASTY Left 01/10/2017   Procedure: LEFT TOTAL KNEE ARTHROPLASTY;  Surgeon: Arnie Lao, MD;  Location: [facility];  Service: Orthopedics;  Laterality: Left;   TOTAL SHOULDER ARTHROPLASTY Right 01/23/2018   Procedure: RIGHT REVERSE TOTAL SHOULDER ARTHROPLASTY;  Surgeon: Jasmine Mesi, MD;  Location: [facility];  Service: Orthopedics;  Laterality: Right;   TRANSCATHETER AORTIC VALVE REPLACEMENT, TRANSFEMORAL N/A 01/15/2022   Procedure: Transcatheter Aortic Valve Replacement, Transfemoral;  Surgeon: Odie Benne, MD;  Location: [facility];  Service: Open Heart Surgery;  Laterality: N/A;    Scheduled Meds:  apixaban   5 mg Oral BID   isosorbide  mononitrate  30 mg Oral Daily   potassium chloride   40 mEq Oral Once   [START ON 07/24/2023] tamsulosin   0.4 mg Oral QPC breakfast    Allergies:    Allergies  Allergen Reactions   Atorvastatin  Other (See Comments)    Muscle fatigue, soreness, nightmares/crazy dreams, severe arthralgias/myalgias   Aspirin  Other (See Comments)    Avoids due to reduced kidney function   Simvastatin  Other (See Comments)    Fatigue, soreness, nightmares   Statins Other (See Comments)    Fatigue, soreness, and nightmares    Social History:   Social History   Socioeconomic History   Marital status: Married    Spouse name: Not on  file   Number of children: 1   Years of education: Not on file   Highest education level: Not on file  Occupational History   Occupation: Equities trader for office equipment  Tobacco Use   Smoking status: Never   Smokeless tobacco: Former    Types: Chew    Quit date: 02/05/1968  Vaping Use   Vaping status: Never Used  Substance and Sexual Activity   Alcohol use: Yes    Comment: 1-2 beers per month   Drug use: No   Sexual activity: Not Currently  Other Topics Concern   Not on file  Social History Narrative   Not on file   Social Drivers of Health   Financial Resource Strain: Not on file  Food Insecurity: No Food Insecurity (01/16/2022)   Hunger Vital Sign    Worried About Running Out of Food in the Last Year: Never true    Ran Out of Food in the Last Year: Never true  Transportation Needs: No Transportation Needs (01/16/2022)   PRAPARE - Administrator, Civil Service (Medical): No    Lack of Transportation (Non-Medical): No  Physical Activity: Not on file  Stress: Not on file  Social Connections: Unknown (06/19/2021)   [facility]   Social Network    Social Network: Not on file  Intimate Partner Violence: Not At Risk (01/16/2022)   Humiliation, Afraid, Rape, and Kick questionnaire    Fear of Current or Ex-Partner: No    Emotionally Abused: No    Physically Abused: No    Sexually Abused: No    Family History:    Family History  Problem Relation Age of Onset   Lung cancer Mother    Breast cancer Mother    Diabetes Mother    Heart failure Father    CAD Father    Diabetes Father      ROS:  Please see the history of present illness.    Physical Exam/Data: Vitals:   07/23/23 1403 07/23/23 1535 07/23/23 1700 07/23/23 1800  BP: 92/68 111/75 120/85 121/66  Pulse: 79 71 72 71  Resp: 18 15 17 11   Temp: 98 F (36.7 C)  98.1 F (36.7 C)   TempSrc:   Oral   SpO2: 99% 100% 100% 99%   No intake or output data in the 24 hours  ending 07/23/23 1832    07/18/2023    2:17 PM 07/04/2023   12:45 PM 04/17/2023    4:13 PM  Last 3 Weights  Weight (lbs) 278 lb 9.6 oz 287 lb 3.2 oz 297 lb 6.4 oz  Weight (kg) 126.372 kg 130.273 kg 134.9 kg     There is no height or weight on file to calculate BMI.  General:  Morbidly obese in no acute distress HEENT: normal Neck: no JVD Cardiac:  normal S1, S2; RRR; Systolic murmur Lungs:  clear to auscultation bilaterally, no wheezing, rhonchi or rales  Abd: soft, nontender, no hepatomegaly  Ext: no edema Musculoskeletal:  No deformities, BUE and BLE strength normal and equal Skin: warm and dry  Neuro:  CNs 2-12 intact, no focal abnormalities noted Psych:  Normal affect   Telemetry:  Telemetry was personally reviewed and demonstrates:  SR FAV with isolated PVCs  Relevant CV Studies: LABS Creatinine: 2.68 (07/23/2023) Potassium: 2.5 (07/23/2023) Sodium: 126 (07/23/2023) Troponin: not suggestive of NSTEMI  DIAGNOSTIC EKG: QT prolongation, sinus rhythm, first degree heart block, narrow QRS, QTC 501 (07/23/2023) Heart catheterization: isolated circumflex lesion, nonobstructive RCA disease Echocardiogram: normal function Telemetry: sinus rhythm with frequent polymorphic PVCs (07/23/2023)  Laboratory Data: High Sensitivity Troponin:   Recent Labs  Lab 07/23/23 1540  TROPONINIHS 26*     Chemistry Recent Labs  Lab 07/18/23 1553 07/23/23 1540  NA 131* 126*  K 3.0* 2.5*  CL 83* 81*  CO2 24 28  GLUCOSE 138* 182*  BUN 67* 71*  CREATININE 3.21* 2.68*  CALCIUM  10.2 9.6  GFRNONAA  --  25*  ANIONGAP  --  17*    Recent Labs  Lab 07/23/23 1540  PROT 8.0  ALBUMIN 4.0  AST 16  ALT 17  ALKPHOS 44  BILITOT 1.1   Lipids  Recent Labs  Lab 07/18/23 1553  CHOL 205*  TRIG 222*  HDL 38*  LABVLDL 40  LDLCALC 127*  CHOLHDL 5.4*    Hematology Recent Labs  Lab 07/18/23 1553 07/23/23 1540  WBC 10.9* 11.6*  RBC 5.84* 5.81  HGB 16.4 16.2  HCT 49.7 47.3  MCV 85  81.4  MCH 28.1 27.9  MCHC 33.0 34.2  RDW 14.3 13.2  PLT 181 199   BNP Recent Labs  Lab 07/18/23 1553 07/23/23 1540  BNP  --  46.7  PROBNP 200  --      Radiology/Studies:  DG Chest 2 View Result Date: 07/23/2023 CLINICAL DATA:  Chest pain EXAM: CHEST - 2 VIEW COMPARISON:  03/10/2023 FINDINGS: TAVR. No acute airspace disease, pleural effusion or pneumothorax. Stable cardiomediastinal silhouette IMPRESSION: No active cardiopulmonary disease. Electronically Signed   By: Esmeralda Hedge M.D.   On: 07/23/2023 15:56     Assessment and Plan:  Coronary artery disease- Unstable angina with hs troponin < 100 6/13 and today - Obstructive coronary artery disease with an isolated circumflex lesion and nonobstructive RCA disease. Recent anginal chest pain episode, currently asymptomatic. High sensitivity troponin not indicative of NSTEMI. Limited medical management options due to QT prolongation and renal function. Heart catheterization postponed due to acute kidney injury risk from contrast. Shared decision-making on catheterization timing, balancing renal function risk with intervention need. Discussed potential dialysis requirement if contrast is used. - Repeat limited echocardiogram to assess cardiac function. - I do not predict we will be able to [facility] this admission but we should work to optimize him so this can be performed without AKI causing him to need HD - on eliquis  (AF) and Imdur ; If CP tomorrow and BP stable will add BB  Aortic stenosis status post TAVR Post-TAVR with a 26 mm Edward Sapien valve in December 2023. - limited echo tomorrow  Atrial fibrillation Paroxysmal atrial fibrillation, currently on Eliquis . - SR presently, AC as above  Chronic kidney disease, stage 3B CKD stage 3B with recent acute kidney injury. Creatinine improved from 3.2 to 2.6. Nephrology involved. Risk of further renal impairment with contrast for heart catheterization. Shared decision-making to optimize  renal function before invasive procedures.  QT prolongation - QT prolongation on EKG with QTC of 501 ms, limiting anti-anginal medication options such as ranolazine.  Premature ventricular contractions - Frequent polymorphic PVCs on telemetry. - BB tomorrow if BP holds  Morbid obesity Diabetes mellitus, uncontrolled Uncontrolled diabetes, currently on Ozempic  outpatient for weight management and glycemic control. Recent therapy initiation, ongoing effectiveness; has morbid obesity with significant weight loss from 334 lbs to 275 lbs. Weight loss intentional, part of health improvement strategy.  Hyponatremia Hyponatremia with sodium level of 126, potentially due to over-diuresis. - agree with [facility] colleagues about fluid resuscitation   Hypokalemia Hypokalemia with potassium level of 2.5. - Administer aggressive potassium repletion.  DC Planning: When Kidney is stable we will need a multi-disciplinary plan of what parameters we can meet to proceed with LHC   Risk Assessment/Risk Scores:    TIMI Risk Score for Unstable Angina or Non-ST Elevation MI:   The patient's TIMI risk score is 4, which indicates a 20% risk of all cause mortality, new or recurrent myocardial infarction or need for urgent revascularization in the next 14 days.   CHA2DS2-VASc Score = 3   This indicates a 3.2% annual risk of stroke. The patient's score is based upon: CHF History: 1 HTN History: 1 Diabetes History: 0 Stroke History: 0 Vascular Disease History: 0 Age Score: 1 Gender Score: 0        For questions or updates, please contact [facility] Please consult [email or web address] for contact info under    Gloriann Larger, MD FASE [facility] Cardiologist [facility]  [address] [place], [place] [address] [phone]  6:36 PM

## 2023-07-24 ENCOUNTER — Observation Stay (HOSPITAL_COMMUNITY)

## 2023-07-24 DIAGNOSIS — N1832 Chronic kidney disease, stage 3b: Secondary | ICD-10-CM | POA: Diagnosis not present

## 2023-07-24 DIAGNOSIS — R079 Chest pain, unspecified: Secondary | ICD-10-CM

## 2023-07-24 DIAGNOSIS — D649 Anemia, unspecified: Secondary | ICD-10-CM | POA: Diagnosis not present

## 2023-07-24 DIAGNOSIS — E875 Hyperkalemia: Secondary | ICD-10-CM | POA: Diagnosis not present

## 2023-07-24 DIAGNOSIS — N179 Acute kidney failure, unspecified: Secondary | ICD-10-CM | POA: Diagnosis not present

## 2023-07-24 DIAGNOSIS — E871 Hypo-osmolality and hyponatremia: Secondary | ICD-10-CM | POA: Diagnosis not present

## 2023-07-24 DIAGNOSIS — R531 Weakness: Secondary | ICD-10-CM | POA: Diagnosis not present

## 2023-07-24 DIAGNOSIS — I959 Hypotension, unspecified: Secondary | ICD-10-CM | POA: Diagnosis not present

## 2023-07-24 LAB — MAGNESIUM: Magnesium: 2.2 mg/dL (ref 1.7–2.4)

## 2023-07-24 LAB — ECHOCARDIOGRAM LIMITED
AR max vel: 2.21 cm2
AV Area VTI: 2.21 cm2
AV Area mean vel: 2.11 cm2
AV Mean grad: 7 mmHg
AV Peak grad: 12.3 mmHg
Ao pk vel: 1.75 m/s
Calc EF: 51.6 %
Single Plane A2C EF: 57.8 %
Single Plane A4C EF: 43 %

## 2023-07-24 LAB — TROPONIN I (HIGH SENSITIVITY): Troponin I (High Sensitivity): 29 ng/L — ABNORMAL HIGH (ref <18)

## 2023-07-24 LAB — COMPREHENSIVE METABOLIC PANEL WITH GFR
ALT: 15 U/L (ref 0–44)
AST: 16 U/L (ref 15–41)
Albumin: 3.7 g/dL (ref 3.5–5.0)
Alkaline Phosphatase: 38 U/L (ref 38–126)
Anion gap: 16 — ABNORMAL HIGH (ref 5–15)
BUN: 76 mg/dL — ABNORMAL HIGH (ref 8–23)
CO2: 30 mmol/L (ref 22–32)
Calcium: 9.6 mg/dL (ref 8.9–10.3)
Chloride: 86 mmol/L — ABNORMAL LOW (ref 98–111)
Creatinine, Ser: 2.89 mg/dL — ABNORMAL HIGH (ref 0.61–1.24)
GFR, Estimated: 23 mL/min — ABNORMAL LOW (ref >60)
Glucose, Bld: 159 mg/dL — ABNORMAL HIGH (ref 70–99)
Potassium: 2.8 mmol/L — ABNORMAL LOW (ref 3.5–5.1)
Sodium: 132 mmol/L — ABNORMAL LOW (ref 135–145)
Total Bilirubin: 0.8 mg/dL (ref 0.0–1.2)
Total Protein: 7.5 g/dL (ref 6.5–8.1)

## 2023-07-24 LAB — CBC
HCT: 45.9 % (ref 39.0–52.0)
Hemoglobin: 15.8 g/dL (ref 13.0–17.0)
MCH: 28.1 pg (ref 26.0–34.0)
MCHC: 34.4 g/dL (ref 30.0–36.0)
MCV: 81.7 fL (ref 80.0–100.0)
Platelets: 194 10*3/uL (ref 150–400)
RBC: 5.62 MIL/uL (ref 4.22–5.81)
RDW: 13.3 % (ref 11.5–15.5)
WBC: 10.8 10*3/uL — ABNORMAL HIGH (ref 4.0–10.5)
nRBC: 0 % (ref 0.0–0.2)

## 2023-07-24 LAB — HIV ANTIBODY (ROUTINE TESTING W REFLEX): HIV Screen 4th Generation wRfx: REACTIVE — AB

## 2023-07-24 LAB — POTASSIUM: Potassium: 3.1 mmol/L — ABNORMAL LOW (ref 3.5–5.1)

## 2023-07-24 MED ORDER — ACETAMINOPHEN 325 MG PO TABS
650.0000 mg | ORAL_TABLET | Freq: Four times a day (QID) | ORAL | Status: DC | PRN
  Administered 2023-07-24: 650 mg via ORAL
  Filled 2023-07-24: qty 2

## 2023-07-24 MED ORDER — SODIUM CHLORIDE 0.9 % WEIGHT BASED INFUSION: 1.0000 mL/kg/h | INTRAVENOUS | Status: DC

## 2023-07-24 MED ORDER — POTASSIUM CHLORIDE CRYS ER 20 MEQ PO TBCR
40.0000 meq | EXTENDED_RELEASE_TABLET | Freq: Once | ORAL | Status: AC
  Administered 2023-07-24: 40 meq via ORAL
  Filled 2023-07-24: qty 2

## 2023-07-24 MED ORDER — PERFLUTREN LIPID MICROSPHERE
1.0000 mL | INTRAVENOUS | Status: AC | PRN
  Administered 2023-07-24: 5 mL via INTRAVENOUS

## 2023-07-24 MED ORDER — SODIUM CHLORIDE 0.9 % WEIGHT BASED INFUSION
3.0000 mL/kg/h | INTRAVENOUS | Status: AC
Start: 2023-07-25 — End: 2023-07-25
  Administered 2023-07-25: 3 mL/kg/h via INTRAVENOUS

## 2023-07-24 NOTE — Progress Notes (Addendum)
 Progress Note  Patient Name: Stephen Maldonado Date of Encounter: 07/24/2023 [facility] Cardiologist: Cody Das, MD   Interval Summary   Patient appears overwhelmed after having a bunch of different discussions this morning He denies any chest pain, said he only had one episode a while ago Mainly complaining of dizziness  Positive orthostatic vitals   Vital Signs Vitals:   07/23/23 2317 07/24/23 0024 07/24/23 0349 07/24/23 0829  BP:  138/85 104/64 123/84  Pulse:   83 80  Resp:  18 18 18   Temp: 98.4 F (36.9 C) 98.6 F (37 C) 98.1 F (36.7 C) 97.8 F (36.6 C)  TempSrc: Oral Oral Oral Oral  SpO2:  90% 100% 95%    Intake/Output Summary (Last 24 hours) at 07/24/2023 0854 Last data filed at 07/24/2023 0847 Gross per 24 hour  Intake 0 ml  Output --  Net 0 ml      07/18/2023    2:17 PM 07/04/2023   12:45 PM 04/17/2023    4:13 PM  Last 3 Weights  Weight (lbs) 278 lb 9.6 oz 287 lb 3.2 oz 297 lb 6.4 oz  Weight (kg) 126.372 kg 130.273 kg 134.9 kg      Telemetry/ECG  Sinus rhythm, HR 70-80s, PVCs - Personally Reviewed  Physical Exam  GEN: No acute distress.   Neck: No JVD Cardiac: RRR, distant heart sounds.  Respiratory: Clear to auscultation bilaterally. GI: Soft, nontender, non-distended  MS: No edema  Assessment & Plan  Mr. Code  is a 68 year old with aortic stenosis status post TAVR and coronary artery disease who presents with dizziness and chest pain   CAD Unstable angina  CKD stage 3b Prolonged QTc Presented with dizziness and brief episode of chest pain  History of isolated circumflex lesion and nonobstructive RCA disease  Troponin level 26 > 29  Limited medical management due to QT prolongation and renal function Creatinine trending back up today at 2.89  Patient has been educated on possible need for dialysis post cath with current renal function  Pending updated echo Nephrology consulted and they believe that this might be the  patients new baseline  Will discuss with MD on timing of LHC with current renal function  Currently on Eliquis  5 mg BID Currently on Imdur  30 mg daily   Orthostatic hypotension  Patients main complaint is dizziness  Tells me that he often feels like he is about to pass out BP Lying 123/62 HR 72, Sitting 102/64 HR 76, standing BP 84/52 HR 84 Hold off on beta-blocker at this time   Aortic stenosis s/p TAVR  Underwent TAVR 01/2022  Pending updated echo  Paroxysmal atrial fibrillation History of PVCs Currently in sinus with HR 70-80s Potasium level 2.5 yesterday, 2.8 today, supplemented per primary  Patient states he takes 20 mEq of K BID at home  Continue Eliquis  5 mg BID  Per primary Morbid obesity  Hyponatremia  Hypokalemia    For questions or updates, please contact [facility] Please consult [email or web address] for contact info under       Signed, Jiles Mote, PA-C   I have personally seen and examined the patient.  My HPI, Exam, and assessment and plan are below, independent of the NPP above.  Patient notes dizziness but no chest pain.  Describes rare chest twinges he has had all of his life.  He called out office because he expressed concerns that every doctor is giving him different   Exam notable for  Gen: no distress, morbid obesity  Neck: No JVD Cardiac: No Rubs or Gallops, systolic murmur, RRR +2 radial pulses Respiratory: Clear to auscultation bilaterally, normal effort, normal  respiratory rate GI: Soft, nontender, non-distended  MS: No  edema;  moves all extremities Integument: Skin feels warm Neuro:  At time of evaluation, alert and oriented to person/place/time/situation  Psych: Anxious affect  Tele: SR with PVCs  In assessment and plan:  CAD CKD Stage IV S/p Sapien Valve Orthostatic Hypotension Qtc prolongation - patient has hx of isolated LCX disease does not describe angina.  He notes dizziness.  Described to other providers as  vertigo.  He had positive orthostatic vital signs today - Nephrology has no concerns about LHC- discussed with Dr. Cindra Cree - if we pursue LHC he is at risk of CIN given the complexity of cath s/p TAVR (2023) I have reached out to patient's cardiologist and discussed with Dr. Aldona Amel as well) - Cardiac Diet today Echo today - patient to ambulate around the room - if Dr. Nathanael Baker feels he can safely to [facility] without significant contrast use (with intervention if needed) we will add on to schedule for Friday - [place] as per primary  Gloriann Larger, MD FASE [facility] Cardiologist [facility]  [address] [place], [address] [place], [place] [address] [phone]  10:41 AM

## 2023-07-24 NOTE — Care Management Obs Status (Signed)
 MEDICARE OBSERVATION STATUS NOTIFICATION   Patient Details  Name: Stephen Maldonado MRN: [id number] Date of Birth: 12/27/1955   Medicare Observation Status Notification Given:  Yes    Janith Melnick 07/24/2023, 9:09 AM

## 2023-07-24 NOTE — Consult Note (Signed)
 Nephrology Consult   Requesting provider: Kathlen Para Service requesting consult: Hospitalist Reason for consult: Elevated creatinine, hypokalemia   Assessment/Recommendations: Stephen Maldonado is a/an 68 y.o. male with a past medical history aortic stenosis status post TAVR, CKD 3B, DVT, HTN, BPH with bladder outlet obstruction, CAD who present w/ chest pain and dizziness c/b elevated Creatinine  Nonoliguric elevated creatinine on CKD 3B: Baseline creatinine around 2.  Rising creatinine has been seen for several days.  Could represent AKI or progression of his underlying CKD.  On exam he does not appear dehydrated to me. - Hold diuretics for now; at least until potassium is repleted -Could consider right heart catheterization to better ascertain volume status -Patient can proceed with left heart catheterization at the discretion of cardiology -Contrast associated kidney injury is always a possibility.  I do not feel he needs significant hydration peri-procedurally unless intra procedure objective data suggest he needs further fluid -Risk of significant renal failure purely from contrast is low.  Some rising creatinine would be expected but likely will be self-limited and improve with time -Continue to monitor daily Cr, Dose meds for GFR -Monitor Daily I/Os, Daily weight  -Maintain MAP>65 for optimal renal perfusion.  -Avoid nephrotoxic medications including NSAIDs -Use synthetic opioids (Fentanyl /Dilaudid ) if needed - Renal ultrasound without concerns -Currently no indication for HD  Chest pain: Planning for [facility] with cardiology.  Consider RHC as well.  Follow-up echocardiogram  Hypokalemia: Hold diuretics and continue with supplementation as needed  Dizziness: Maybe some lightheadedness but mostly vertigo.  Management per primary team  Hypotension: Could be concerning for cardiac compromise.  Does not appear significantly dehydrated on exam to me.  Would be careful with fluids.   Consider RHC  Hyponatremia: Already improved.  Continue to monitor    Recommendations conveyed to primary service.    Levorn Reason [facility] 07/24/2023 11:08 AM   _____________________________________________________________________________________ CC: Weakness and chest pain  History of Present Illness: Stephen Maldonado is a/an 68 y.o. male with a past medical history of aortic stenosis status post TAVR, CKD 3B, DVT, HTN, BPH with bladder outlet obstruction, CAD who presents with weakness and chest pain also with dizziness.  The patient states he has been dealing with dizziness for several months but it has worsened recently.  He describes it as vertigo with the room spinning.  He sometimes has lightheadedness.  This has been a more recent development.  He has also been having chest pain for some time and it does improve with sublingual nitroglycerin .  He also has noticed worsening fatigue and weakness.  He has had some shortness of breath.  He also has had nausea over the past couple weeks with some episodes of nonbloody vomiting.  He says his blood pressure is higher at home but he does not trust his blood pressure cuff.  He denies dysuria, hematuria, difficulties voiding.  No diarrhea.  He was seen by cardiology in the outpatient setting who recommended he come in for further evaluation and concern for dehydration.  In the emergency department was noted to have relative hypotension.  Sodium was somewhat low also with low potassium and creatinine up to 2.8 today.  Baseline creatinine seems to be around 2.  Notably he saw Dr. Austine Blunt at [facility] 3 days ago.  His creatinine was noted to be 2.8 at that time as well.  I agree with Dr. Loyola Rummage assessment in his note which states that there is concern for cardiac compromise contributing  to the patient's rising creatinine.  This could otherwise be known as cardiorenal syndrome.  It was recommended that cardiology  proceed with heart catheterization.   Medications:  Current Facility-Administered Medications  Medication Dose Route Frequency Provider Last Rate Last Admin   apixaban  (ELIQUIS ) tablet 5 mg  5 mg Oral BID Utomwen, Adesuwa, RPH   5 mg at 07/23/23 2033   isosorbide  mononitrate (IMDUR ) 24 hr tablet 30 mg  30 mg Oral Daily Gherghe, Costin M, MD   30 mg at 07/23/23 2033   potassium chloride  SA (KLOR-CON  M) CR tablet 40 mEq  40 mEq Oral Once Gherghe, Costin M, MD       tamsulosin  (FLOMAX ) capsule 0.4 mg  0.4 mg Oral QPC breakfast Osborn Blaze, MD         ALLERGIES Atorvastatin , Aspirin , Simvastatin , and Statins  MEDICAL HISTORY Past Medical History:  Diagnosis Date   Arthritis    knees   Bladder spasms    BPH (benign prostatic hyperplasia)    DVT (deep venous thrombosis) (HCC) ocyt 2016   left leg tx medically   GERD (gastroesophageal reflux disease)    History of colon polyps    hyperplastic 06-14-2010   Hydronephrosis    Hypertension    Obesity, morbid (HCC)    S/P TAVR (transcatheter aortic valve replacement) 01/15/2022   s/p TAVR with a 26 mm Edwards S3UR via the TF approach by Dr. Abel Hoe & Dr. Narciso Backers   Urinary retention      SOCIAL HISTORY Social History   Socioeconomic History   Marital status: Married    Spouse name: Not on file   Number of children: 1   Years of education: Not on file   Highest education level: Not on file  Occupational History   Occupation: Retired-Operation Production designer, theatre/television/film for office equipment  Tobacco Use   Smoking status: Never   Smokeless tobacco: Former    Types: Chew    Quit date: 02/05/1968  Vaping Use   Vaping status: Never Used  Substance and Sexual Activity   Alcohol use: Yes    Comment: 1-2 beers per month   Drug use: No   Sexual activity: Not Currently  Other Topics Concern   Not on file  Social History Narrative   Not on file   Social Drivers of Health   Financial Resource Strain: Not on file  Food Insecurity: No Food  Insecurity (01/16/2022)   Hunger Vital Sign    Worried About Running Out of Food in the Last Year: Never true    Ran Out of Food in the Last Year: Never true  Transportation Needs: No Transportation Needs (01/16/2022)   PRAPARE - Administrator, Civil Service (Medical): No    Lack of Transportation (Non-Medical): No  Physical Activity: Not on file  Stress: Not on file  Social Connections: Unknown (06/19/2021)   Received from [facility]   Social Network    Social Network: Not on file  Intimate Partner Violence: Not At Risk (01/16/2022)   Humiliation, Afraid, Rape, and Kick questionnaire    Fear of Current or Ex-Partner: No    Emotionally Abused: No    Physically Abused: No    Sexually Abused: No     FAMILY HISTORY Family History  Problem Relation Age of Onset   Lung cancer Mother    Breast cancer Mother    Diabetes Mother    Heart failure Father    CAD Father    Diabetes Father  Review of Systems: 12 systems reviewed Otherwise as per HPI, all other systems reviewed and negative  Physical Exam: Vitals:   07/24/23 0349 07/24/23 0829  BP: 104/64 123/84  Pulse: 83 80  Resp: 18 18  Temp: 98.1 F (36.7 C) 97.8 F (36.6 C)  SpO2: 100% 95%   No intake/output data recorded.  Intake/Output Summary (Last 24 hours) at 07/24/2023 1108 Last data filed at 07/24/2023 0847 Gross per 24 hour  Intake 0 ml  Output --  Net 0 ml   General: Tired appearing, sitting up in bed, no distress HEENT: anicteric sclera, oropharynx clear without lesions CV: Normal rate, no rub, no peripheral edema Lungs: Bilateral chest rise with no increased work of breathing, faint crackles at the right lung base Abd: soft, non-tender, non-distended Skin: no visible lesions or rashes Psych: alert, engaged, appropriate mood and affect Musculoskeletal: no obvious deformities Neuro: normal speech, no gross focal deficits   Test Results Reviewed Lab Results  Component Value Date    NA 132 (L) 07/24/2023   K 2.8 (L) 07/24/2023   CL 86 (L) 07/24/2023   CO2 30 07/24/2023   BUN 76 (H) 07/24/2023   CREATININE 2.89 (H) 07/24/2023   CALCIUM  9.6 07/24/2023   ALBUMIN 3.7 07/24/2023    CBC Recent Labs  Lab 07/18/23 1553 07/23/23 1540 07/24/23 0148  WBC 10.9* 11.6* 10.8*  NEUTROABS  --  7.4  --   HGB 16.4 16.2 15.8  HCT 49.7 47.3 45.9  MCV 85 81.4 81.7  PLT 181 199 194    I have reviewed all relevant outside healthcare records related to the patient's current hospitalization

## 2023-07-24 NOTE — Progress Notes (Signed)
 Pt previously requested tylenol  for pain, once order was obtained, nurse went in to assess pain. Pt was asked to rate pain on a 1-10 scale, pt became angry and stated 44, hell I don't know, that's a bogus question to ask unless someone is dying. Pt continued to be rude and expressed his frustration of being in the hospital.

## 2023-07-24 NOTE — Progress Notes (Signed)
 PROGRESS NOTE  Stephen Maldonado ZOX:[id number] DOB: [birth year] DOA: 07/23/2023 PCP: Benedetta Bradley, MD   LOS: 0 days   Brief Narrative / Interim history: 68 y.o. male with medical history significant of AAS status post TAVR, chronic kidney disease, prior DVT, HTN, obesity, BPH, known CAD with a positive stress test June 2024 who is supposed to get the cardiac cath as an outpatient comes into the hospital with an episode of chest pain most recent a week ago, relieved with sublingual nitroglycerin  as well as increased weakness and overall fatigue.   Subjective / 24h Interval events: No chest pain, remains dizzy with standing  Assesement and Plan: Principal problem Chest pain, concern for underlying CAD -patient will be admitted to the hospital with an episode of chest pain last week, relieved by sublingual nitroglycerin  and increased dizziness in the last week.  Has been having couple episodes of nausea as well.  He was supposed to get a cardiac cath as an outpatient but this has been postponed due to his renal function. - Currently he is without chest pain, troponin is fairly low.  Cardiology consulted, appreciate input, considering LHC tomorrow based on further discussion with interventional cardiology    Active problems Hyponatremia -suspect he was on the dry side given use of furosemide , given his hypokalemia as well. He received limited fluids, sodium now better   Hypokalemia-due to diuretics, K still less than 3 today, continue to replete   Dizziness, weakness -He was orthostatic on admission, systolic dropped into 80s, and he was symptomatic. Hold further fluids to avoid overload, hold diuretics as well    Essential hypertension-he is orthostatic, continue to hold home Coreg , diltiazem , hydralazine    Hyperlipidemia -seems to have intolerance to statin   Obesity, class II-BMI 38, he would benefit from weight loss   Chronic kidney disease stage IV -prior creatinines back in  2023 were in the 1.5-1.6 range.  Most recent outpatient creatinine earlier 2.8-3.2 based on CKA and cards results. Suspect this is his current baseline. This morning at 2.8   Scheduled Meds:  apixaban   5 mg Oral BID   isosorbide  mononitrate  30 mg Oral Daily   tamsulosin   0.4 mg Oral QPC breakfast   Continuous Infusions: PRN Meds:.acetaminophen   Current Outpatient Medications  Medication Instructions   allopurinol (ZYLOPRIM) 200 mg, Oral, Daily PRN   amoxicillin  (AMOXIL ) 2,000 mg, Oral, As directed, 1 hour prior to dental work including cleanings   apixaban  (ELIQUIS ) 5 mg, Oral, 2 times daily   carvedilol  (COREG ) 12.5 mg, Oral, 2 times daily with meals   clopidogrel (PLAVIX) 75 mg, Oral, Daily   CoQ-10 200 mg, Daily   cyclobenzaprine (FLEXERIL) 10 mg, Oral, 3 times daily PRN   diltiazem  (CARDIZEM  LA) 240 mg, Oral, Daily   Fish Oil 1,000 mg, Daily   furosemide  (LASIX ) 80 mg, Oral, 2 times daily   Garlic  1,000 mg, Daily   hydrALAZINE  (APRESOLINE ) 25 mg, 2 times daily with meals   isosorbide  mononitrate (IMDUR ) 30 mg, Oral, Daily   Lecithin 400 MG CAPS 400 mg, Oral, Daily at bedtime   metolazone  (ZAROXOLYN ) 5 mg, Oral, Every other day, Take on Mondays, Wednesdays, and Fridays.   naphazoline-pheniramine (ALLERGY EYE) 0.025-0.3 % ophthalmic solution 1 drop, 4 times daily PRN   Ozempic  (0.25 or 0.5 MG/DOSE) 0.25 mg   Potassium Chloride  ER 20 MEQ TBCR 20 mEq, 2 times daily   tamsulosin  (FLOMAX ) 0.4 mg, Every morning   Thiamine HCl (VITAMIN B-1 PO) 1 tablet,  Oral, Daily   Turmeric 500 mg, Daily   vitamin C  1,000 mg, Oral, Daily   Vitamin D3 1,000 Units, Oral, Daily    Diet Orders (From admission, onward)     Start     Ordered   07/24/23 1014  Diet Heart Room service appropriate? Yes; Fluid consistency: Thin  Diet effective now       Question Answer Comment  Room service appropriate? Yes   Fluid consistency: Thin      07/24/23 1013            DVT prophylaxis: SCDs Start:  07/23/23 2010 apixaban  (ELIQUIS ) tablet 5 mg   Lab Results  Component Value Date   PLT 194 07/24/2023      Code Status: Full Code  Family Communication: no family at bedside   Status is: Observation The patient will require care spanning > 2 midnights and should be moved to inpatient because: cath tomorrow, hypokalemia   Level of care: Telemetry Cardiac  Consultants:  Cardiology  Nephrology   Objective: Vitals:   07/24/23 0024 07/24/23 0349 07/24/23 0829 07/24/23 1117  BP: 138/85 104/64 123/84 117/81  Pulse:  83 80 74  Resp: 18 18 18 18   Temp: 98.6 F (37 C) 98.1 F (36.7 C) 97.8 F (36.6 C) 97.6 F (36.4 C)  TempSrc: Oral Oral Oral Oral  SpO2: 90% 100% 95% 96%    Intake/Output Summary (Last 24 hours) at 07/24/2023 1122 Last data filed at 07/24/2023 0847 Gross per 24 hour  Intake 0 ml  Output --  Net 0 ml   Wt Readings from Last 3 Encounters:  07/18/23 126.4 kg  07/04/23 130.3 kg  04/17/23 134.9 kg    Examination:  Constitutional: NAD Eyes: no scleral icterus ENMT: Mucous membranes are moist.  Neck: normal, supple Respiratory: clear to auscultation bilaterally, no wheezing, no crackles. Normal respiratory effort. No accessory muscle use.  Cardiovascular: Regular rate and rhythm, no murmurs / rubs / gallops. Trace LE edema.  Abdomen: non distended, no tenderness. Bowel sounds positive.  Musculoskeletal: no clubbing / cyanosis.   Data Reviewed: I have independently reviewed following labs and imaging studies   CBC Recent Labs  Lab 07/18/23 1553 07/23/23 1540 07/24/23 0148  WBC 10.9* 11.6* 10.8*  HGB 16.4 16.2 15.8  HCT 49.7 47.3 45.9  PLT 181 199 194  MCV 85 81.4 81.7  MCH 28.1 27.9 28.1  MCHC 33.0 34.2 34.4  RDW 14.3 13.2 13.3  LYMPHSABS  --  3.0  --   MONOABS  --  1.0  --   EOSABS  --  0.1  --   BASOSABS  --  0.1  --     Recent Labs  Lab 07/18/23 1553 07/23/23 1540 07/24/23 0148  NA 131* 126* 132*  K 3.0* 2.5* 2.8*  CL 83* 81*  86*  CO2 24 28 30   GLUCOSE 138* 182* 159*  BUN 67* 71* 76*  CREATININE 3.21* 2.68* 2.89*  CALCIUM  10.2 9.6 9.6  AST  --  16 16  ALT  --  17 15  ALKPHOS  --  44 38  BILITOT  --  1.1 0.8  ALBUMIN  --  4.0 3.7  MG  --   --  2.2  BNP  --  46.7  --     ------------------------------------------------------------------------------------------------------------------ No results for input(s): CHOL, HDL, LDLCALC, TRIG, CHOLHDL, LDLDIRECT in the last 72 hours.  No results found for: HGBA1C ------------------------------------------------------------------------------------------------------------------ No results for input(s): TSH, T4TOTAL, T3FREE, THYROIDAB in the  last 72 hours.  Invalid input(s): FREET3  Cardiac Enzymes No results for input(s): CKMB, TROPONINI, MYOGLOBIN in the last 168 hours.  Invalid input(s): CK ------------------------------------------------------------------------------------------------------------------    Component Value Date/Time   BNP 46.7 07/23/2023 1540    CBG: No results for input(s): GLUCAP in the last 168 hours.  No results found for this or any previous visit (from the past 240 hours).   Radiology Studies: US  Renal Result Date: 07/23/2023 CLINICAL DATA:  Worsening renal function EXAM: RENAL / URINARY TRACT ULTRASOUND COMPLETE COMPARISON:  CT 12/04/2021, ultrasound 10/02/2016 FINDINGS: Right Kidney: Renal measurements: 12.8 x 5.5 x 5.9 cm = volume: 217.5 mL. Echogenicity within normal limits. No mass or hydronephrosis. Left Kidney: Renal measurements: 11 x 4.4 x 4.1 cm = volume: 104.7 mL. Cortex appears slightly echogenic. There is diffuse cortical thinning consistent with atrophy. No hydronephrosis. Cyst at the lower pole measuring 3.6 cm. Cyst at the upper pole measuring 4 cm. No imaging follow-up is recommended. Bladder: Appears normal for degree of bladder distention. Other: None. IMPRESSION: 1. No hydronephrosis.  2. Atrophic left kidney with cortical thinning and increased echogenicity consistent with chronic medical renal disease. Electronically Signed   By: Esmeralda Hedge M.D.   On: 07/23/2023 19:21   DG Chest 2 View Result Date: 07/23/2023 CLINICAL DATA:  Chest pain EXAM: CHEST - 2 VIEW COMPARISON:  03/10/2023 FINDINGS: TAVR. No acute airspace disease, pleural effusion or pneumothorax. Stable cardiomediastinal silhouette IMPRESSION: No active cardiopulmonary disease. Electronically Signed   By: Esmeralda Hedge M.D.   On: 07/23/2023 15:56     Kathlen Para, MD, PhD [facility]  Between 7 am - 7 pm I am available, please contact me via Amion (for emergencies) or Securechat (non urgent messages)  Between 7 pm - 7 am I am not available, please contact night coverage MD/APP via Amion

## 2023-07-24 NOTE — Plan of Care (Signed)
 Returned to evaluate patient due to multi-disciplinary meeting about cath  Key event: Walked around a little bit.  No further chest pain Dizziness is better; mild with walking.  AP:  CAD with known obstructive disease - Discussed with Drs. Lin, Patwardan, Ned Balint, Peeples: - if GFR 25 +, we will do LHC - otherwise will defer to outpatient  Access recommendations: R radial Procedural considerations May be diagnostic only   Coordination plan: discussed with the above, patient and wife - reviewed questions about GFR, Creatinine, electrolytes  After discussed with patient and wife; he notes that he is not yet amenable for LHC- no further CP - he would like to see what the echo looks like and a see what his AM BMP before officially consenting. Amenable for NPO at midnight.  Will await labs.   Time spent 30 minutes   Gloriann Larger, MD FASE [facility] Cardiologist [facility]  [address] [place], [place] [address] [phone]  3:57 PM

## 2023-07-24 NOTE — Telephone Encounter (Signed)
 Pt is calling back asking to speak to nurse Ruthann Cover

## 2023-07-24 NOTE — Care Management Obs Status (Signed)
 MEDICARE OBSERVATION STATUS NOTIFICATION   Patient Details  Name: Stephen Maldonado MRN: [id number] Date of Birth: Apr 04, 1955   Medicare Observation Status Notification Given:       Janith Melnick 07/24/2023, 9:08 AM

## 2023-07-24 NOTE — TOC CM/SW Note (Signed)
 Transition of Care [facility] - Inpatient Brief Assessment   Patient Details  Name: Stephen Maldonado MRN: [id number] Date of Birth: Jul 11, 1955  Transition of Care [facility] CM/SW Contact:    Cosimo Diones, RN Phone Number: 07/24/2023, 4:21 PM   Clinical Narrative: Patient presented for generalized weakness. PTA patient was independent from home with spouse. Amalia Badder was at the bedside during the visit. Patient states he has a PCP and gets to appointments without any issues. No home needs identified during the visit.     Transition of Care Asessment: Insurance and Status: Insurance coverage has been reviewed Patient has primary care physician: Yes Home environment has been reviewed: reviewed Prior level of function:: independent Prior/Current Home Services: No current home services Social Drivers of Health Review: SDOH reviewed no interventions necessary Readmission risk has been reviewed: Yes Transition of care needs: no transition of care needs at this time

## 2023-07-24 NOTE — Progress Notes (Signed)
 Upon assessment, pt agitated, he refuses to put on hospital gown, refuses non skid socks. He expresses frustration with being NPO, nurse attempted to educate pt on reason for NPO, he refused education and continued to express frustration.

## 2023-07-24 NOTE — Telephone Encounter (Signed)
 Called pt who reports he is tired of sitting in the hospital.  He is ready to have surgery.   Advised pt to speak with hospital staff regarding concerns.  Pt reports wants to make MD aware of his concern.  Advised pt to speak with hospital staff for assistance.  Pt expresses will talk with hospital staff.

## 2023-07-24 NOTE — Plan of Care (Signed)

## 2023-07-25 ENCOUNTER — Encounter (HOSPITAL_COMMUNITY): Admission: EM | Payer: Self-pay | Source: Home / Self Care | Attending: Emergency Medicine

## 2023-07-25 DIAGNOSIS — I480 Paroxysmal atrial fibrillation: Secondary | ICD-10-CM

## 2023-07-25 DIAGNOSIS — I2510 Atherosclerotic heart disease of native coronary artery without angina pectoris: Secondary | ICD-10-CM

## 2023-07-25 DIAGNOSIS — N1832 Chronic kidney disease, stage 3b: Secondary | ICD-10-CM

## 2023-07-25 DIAGNOSIS — I951 Orthostatic hypotension: Secondary | ICD-10-CM | POA: Diagnosis not present

## 2023-07-25 DIAGNOSIS — I350 Nonrheumatic aortic (valve) stenosis: Secondary | ICD-10-CM

## 2023-07-25 DIAGNOSIS — N179 Acute kidney failure, unspecified: Secondary | ICD-10-CM | POA: Diagnosis not present

## 2023-07-25 DIAGNOSIS — R079 Chest pain, unspecified: Secondary | ICD-10-CM | POA: Diagnosis not present

## 2023-07-25 DIAGNOSIS — E875 Hyperkalemia: Secondary | ICD-10-CM | POA: Diagnosis not present

## 2023-07-25 DIAGNOSIS — D649 Anemia, unspecified: Secondary | ICD-10-CM | POA: Diagnosis not present

## 2023-07-25 DIAGNOSIS — I959 Hypotension, unspecified: Secondary | ICD-10-CM | POA: Diagnosis not present

## 2023-07-25 DIAGNOSIS — R531 Weakness: Secondary | ICD-10-CM | POA: Diagnosis not present

## 2023-07-25 DIAGNOSIS — E871 Hypo-osmolality and hyponatremia: Secondary | ICD-10-CM | POA: Diagnosis not present

## 2023-07-25 LAB — BASIC METABOLIC PANEL WITH GFR
Anion gap: 13 (ref 5–15)
BUN: 60 mg/dL — ABNORMAL HIGH (ref 8–23)
CO2: 25 mmol/L (ref 22–32)
Calcium: 8.9 mg/dL (ref 8.9–10.3)
Chloride: 96 mmol/L — ABNORMAL LOW (ref 98–111)
Creatinine, Ser: 2.3 mg/dL — ABNORMAL HIGH (ref 0.61–1.24)
GFR, Estimated: 30 mL/min — ABNORMAL LOW (ref >60)
Glucose, Bld: 187 mg/dL — ABNORMAL HIGH (ref 70–99)
Potassium: 3.1 mmol/L — ABNORMAL LOW (ref 3.5–5.1)
Sodium: 134 mmol/L — ABNORMAL LOW (ref 135–145)

## 2023-07-25 LAB — MAGNESIUM: Magnesium: 2.2 mg/dL (ref 1.7–2.4)

## 2023-07-25 LAB — CBC
HCT: 45.3 % (ref 39.0–52.0)
Hemoglobin: 15.1 g/dL (ref 13.0–17.0)
MCH: 27.5 pg (ref 26.0–34.0)
MCHC: 33.3 g/dL (ref 30.0–36.0)
MCV: 82.5 fL (ref 80.0–100.0)
Platelets: 194 10*3/uL (ref 150–400)
RBC: 5.49 MIL/uL (ref 4.22–5.81)
RDW: 13.6 % (ref 11.5–15.5)
WBC: 8.5 10*3/uL (ref 4.0–10.5)
nRBC: 0 % (ref 0.0–0.2)

## 2023-07-25 SURGERY — LEFT HEART CATH AND CORONARY ANGIOGRAPHY
Anesthesia: LOCAL

## 2023-07-25 MED ORDER — CARVEDILOL 12.5 MG PO TABS: 6.2500 mg | ORAL_TABLET | Freq: Two times a day (BID) | ORAL | Status: DC

## 2023-07-25 MED ORDER — ASPIRIN 81 MG PO CHEW: 81.0000 mg | CHEWABLE_TABLET | ORAL | Status: DC

## 2023-07-25 MED ORDER — FUROSEMIDE 80 MG PO TABS
40.0000 mg | ORAL_TABLET | Freq: Two times a day (BID) | ORAL | Status: DC
Start: 2023-07-25 — End: 2023-09-12

## 2023-07-25 MED ORDER — ASPIRIN 81 MG PO CHEW
81.0000 mg | CHEWABLE_TABLET | ORAL | Status: DC
Start: 2023-07-26 — End: 2023-07-25

## 2023-07-25 MED ORDER — POTASSIUM CHLORIDE CRYS ER 20 MEQ PO TBCR
40.0000 meq | EXTENDED_RELEASE_TABLET | Freq: Once | ORAL | Status: AC
  Administered 2023-07-25: 40 meq via ORAL
  Filled 2023-07-25: qty 2

## 2023-07-25 NOTE — Progress Notes (Signed)
  Progress Note  Patient Name: Stephen Maldonado Date of Encounter: 07/25/2023 [facility] Cardiologist: Cody Das, MD   Interval Summary    No further episodes of chest pain since admission. Was light-headed briefly this morning when up to the bathroom.   Vital Signs Vitals:   07/24/23 2337 07/25/23 0242 07/25/23 0415 07/25/23 0842  BP: (!) 114/54  124/68 132/73  Pulse: 80  75 80  Resp: 16  18   Temp: 97.8 F (36.6 C)  97.7 F (36.5 C) 98.2 F (36.8 C)  TempSrc: Oral  Oral Oral  SpO2: 96%  93% 95%  Weight:  124.7 kg      Intake/Output Summary (Last 24 hours) at 07/25/2023 0906 Last data filed at 07/24/2023 1857 Gross per 24 hour  Intake 120 ml  Output --  Net 120 ml      07/25/2023    2:42 AM 07/18/2023    2:17 PM 07/04/2023   12:45 PM  Last 3 Weights  Weight (lbs) 275 lb 278 lb 9.6 oz 287 lb 3.2 oz  Weight (kg) 124.739 kg 126.372 kg 130.273 kg      Telemetry/ECG  Sinus Rhythm, PVCs, brief episode NSVT - Personally Reviewed  Physical Exam  GEN: No acute distress.   Neck: No JVD Cardiac: RRR, no murmurs, rubs, or gallops.  Respiratory: Clear to auscultation bilaterally. GI: Soft, nontender, non-distended  MS: No edema  Assessment & Plan   Stephen Maldonado is a 68 year old with aortic stenosis status post TAVR and coronary artery disease who presents with dizziness and chest pain   Non-obstructive CAD Unstable angina -- Cardiac catheterization 02/2021 with mid to focal 80% stenosis of circumflex, 60% proximal RCA was treated medically. -- Reports 1 episode of chest pain prior to admission which was responsive to sublingual nitroglycerin , no recurrent episodes since admission -- hsTn 26>>29, EKG with nonspecific changes  -- Limited echo 6/19 with LVEF of 50 to 55%, mildly reduced/enlarged RV, aortic valve mean gradient of 7 mmHg -- pending possible LHC this morning bases on Cr   Orthostatic hypotension Dizziness -- Main complaint on admission was  dizziness, was noted to be orthostatic 6/18 -- Beta-blocker currently held  CKD stage IIIb -- baseline Cr around 2, up to 2.89 yesterday -- seen by nephrology yesterday, felt risk of renal failure with limited contrast is low  -- AM labs pending   Aortic stenosis status post TAVR '23 -- Echo with aortic valve gradient of 7 mmHg  Paroxysmal atrial fibrillation -- Remains in sinus rhythm, will hold a.m. dose of Eliquis  pending possible cardiac catheterization  Hypokalemia -- K+ 3.1 yesterday afternoon, AM labs pending  For questions or updates, please contact [facility] Please consult [email or web address] for contact info under       Signed, Johnie Nailer, NP

## 2023-07-25 NOTE — Plan of Care (Signed)
  Problem: Education: Goal: Knowledge of General Education information will improve Description: Including pain rating scale, medication(s)/side effects and non-pharmacologic comfort measures Outcome: Completed/Met   Problem: Health Behavior/Discharge Planning: Goal: Ability to manage health-related needs will improve Outcome: Completed/Met   Problem: Clinical Measurements: Goal: Ability to maintain clinical measurements within normal limits will improve Outcome: Completed/Met Goal: Will remain free from infection Outcome: Completed/Met Goal: Diagnostic test results will improve Outcome: Completed/Met Goal: Respiratory complications will improve Outcome: Completed/Met Goal: Cardiovascular complication will be avoided Outcome: Completed/Met   Problem: Activity: Goal: Risk for activity intolerance will decrease Outcome: Completed/Met   Problem: Nutrition: Goal: Adequate nutrition will be maintained Outcome: Completed/Met   Problem: Coping: Goal: Level of anxiety will decrease Outcome: Completed/Met   Problem: Elimination: Goal: Will not experience complications related to bowel motility Outcome: Completed/Met Goal: Will not experience complications related to urinary retention Outcome: Completed/Met   Problem: Pain Managment: Goal: General experience of comfort will improve and/or be controlled Outcome: Completed/Met   Problem: Safety: Goal: Ability to remain free from injury will improve Outcome: Completed/Met   Problem: Skin Integrity: Goal: Risk for impaired skin integrity will decrease Outcome: Completed/Met   Problem: Education: Goal: Understanding of CV disease, CV risk reduction, and recovery process will improve Outcome: Completed/Met Goal: Individualized Educational Video(s) Outcome: Completed/Met   Problem: Activity: Goal: Ability to return to baseline activity level will improve Outcome: Completed/Met   Problem: Cardiovascular: Goal: Ability to  achieve and maintain adequate cardiovascular perfusion will improve Outcome: Completed/Met Goal: Vascular access site(s) Level 0-1 will be maintained Outcome: Completed/Met   Problem: Health Behavior/Discharge Planning: Goal: Ability to safely manage health-related needs after discharge will improve Outcome: Completed/Met

## 2023-07-25 NOTE — TOC Transition Note (Signed)
 Transition of Care [facility]) - Discharge Note   Patient Details  Name: Stephen Maldonado MRN: [id number] Date of Birth: 07-Sep-1955  Transition of [facility]) CM/SW Contact:  Ronni Colace, RN Phone Number: 07/25/2023, 1:16 PM   Clinical Narrative:    Patient discharging no needs identified, home self care   Final next level of care: Home/Self Care Barriers to Discharge: No Barriers Identified   Patient Goals and CMS Choice            Discharge Placement                       Discharge Plan and Services Additional resources added to the After Visit Summary for                                       Social Drivers of Health (SDOH) Interventions SDOH Screenings   Food Insecurity: No Food Insecurity (07/24/2023)  Housing: Low Risk  (07/24/2023)  Transportation Needs: No Transportation Needs (07/24/2023)  Utilities: Not At Risk (07/24/2023)  Depression (PHQ2-9): Low Risk  (07/14/2020)  Social Connections: Unknown (07/24/2023)  Tobacco Use: Medium Risk (07/18/2023)     Readmission Risk Interventions     No data to display

## 2023-07-25 NOTE — Discharge Instructions (Signed)
 Follow with Benedetta Bradley, MD in 5-7 days  Please get a complete blood count and chemistry panel checked by your Primary MD at your next visit, and again as instructed by your Primary MD. Please get your medications reviewed and adjusted by your Primary MD.  Please request your Primary MD to go over all Hospital Tests and Procedure/Radiological results at the follow up, please get all Hospital records sent to your Prim MD by signing hospital release before you go home.  In some cases, there will be blood work, cultures and biopsy results pending at the time of your discharge. Please request that your primary care M.D. goes through all the records of your hospital data and follows up on these results.  If you had Pneumonia of Lung problems at the Hospital: Please get a 2 view Chest X ray done in 6-8 weeks after hospital discharge or sooner if instructed by your Primary MD.  If you have Congestive Heart Failure: Please call your Cardiologist or Primary MD anytime you have any of the following symptoms:  1) 3 pound weight gain in 24 hours or 5 pounds in 1 week  2) shortness of breath, with or without a dry hacking cough  3) swelling in the hands, feet or stomach  4) if you have to sleep on extra pillows at night in order to breathe  Follow cardiac low salt diet and 1.5 lit/day fluid restriction.  If you have diabetes Accuchecks 4 times/day, Once in AM empty stomach and then before each meal. Log in all results and show them to your primary doctor at your next visit. If any glucose reading is under 80 or above 300 call your primary MD immediately.  If you have Seizure/Convulsions/Epilepsy: Please do not drive, operate heavy machinery, participate in activities at heights or participate in high speed sports until you have seen by Primary MD or a Neurologist and advised to do so again. Per [place] statutes, patients with seizures are not allowed to drive until they have been  seizure-free for six months.  Use caution when using heavy equipment or power tools. Avoid working on ladders or at heights. Take showers instead of baths. Ensure the water temperature is not too high on the home water heater. Do not go swimming alone. Do not lock yourself in a room alone (i.e. bathroom). When caring for infants or small children, sit down when holding, feeding, or changing them to minimize risk of injury to the child in the event you have a seizure. Maintain good sleep hygiene. Avoid alcohol.   If you had Gastrointestinal Bleeding: Please ask your Primary MD to check a complete blood count within one week of discharge or at your next visit. Your endoscopic/colonoscopic biopsies that are pending at the time of discharge, will also need to followed by your Primary MD.  Get Medicines reviewed and adjusted. Please take all your medications with you for your next visit with your Primary MD  Please request your Primary MD to go over all hospital tests and procedure/radiological results at the follow up, please ask your Primary MD to get all Hospital records sent to his/her office.  If you experience worsening of your admission symptoms, develop shortness of breath, life threatening emergency, suicidal or homicidal thoughts you must seek medical attention immediately by calling 911 or calling your MD immediately  if symptoms less severe.  You must read complete instructions/literature along with all the possible adverse reactions/side effects for all the Medicines you  take and that have been prescribed to you. Take any new Medicines after you have completely understood and accpet all the possible adverse reactions/side effects.   Do not drive or operate heavy machinery when taking Pain medications.   Do not take more than prescribed Pain, Sleep and Anxiety Medications  Special Instructions: If you have smoked or chewed Tobacco  in the last 2 yrs please stop smoking, stop any regular  Alcohol  and or any Recreational drug use.  Wear Seat belts while driving.  Please note You were cared for by a hospitalist during your hospital stay. If you have any questions about your discharge medications or the care you received while you were in the hospital after you are discharged, you can call the unit and asked to speak with the hospitalist on call if the hospitalist that took care of you is not available. Once you are discharged, your primary care physician will handle any further medical issues. Please note that NO REFILLS for any discharge medications will be authorized once you are discharged, as it is imperative that you return to your primary care physician (or establish a relationship with a primary care physician if you do not have one) for your aftercare needs so that they can reassess your need for medications and monitor your lab values.  You can reach the hospitalist office at phone [phone] or fax [phone]   If you do not have a primary care physician, you can call [phone] for a physician referral.  Activity: As tolerated with Full fall precautions use walker/cane & assistance as needed    Diet: low sodium  Disposition Home

## 2023-07-25 NOTE — Progress Notes (Signed)
 Subjective:  UOP not well recorded-  crt down today -  less dizzy but did have an episode last night  Objective Vital signs in last 24 hours: Vitals:   07/24/23 2337 07/25/23 0242 07/25/23 0415 07/25/23 0842  BP: (!) 114/54  124/68 132/73  Pulse: 80  75 80  Resp: 16  18   Temp: 97.8 F (36.6 C)  97.7 F (36.5 C) 98.2 F (36.8 C)  TempSrc: Oral  Oral Oral  SpO2: 96%  93% 95%  Weight:  124.7 kg     Weight change:   Intake/Output Summary (Last 24 hours) at 07/25/2023 1113 Last data filed at 07/25/2023 1000 Gross per 24 hour  Intake 120 ml  Output --  Net 120 ml    Assessment/Recommendations:  68 y.o. male with a past medical history aortic stenosis status post TAVR, CKD 3B, DVT, HTN, BPH with bladder outlet obstruction, CAD who present w/ chest pain and dizziness c/b elevated Creatinine   Nonoliguric elevated creatinine on CKD 3B-  followed by Dr. Austine Blunt at [facility]: Baseline creatinine around 2.  Rising creatinine has been seen for several days.   AKI most likely related to hypotension and most notably orthostatic hypotension.  On exam he does not appear dehydrated to me. Urine is bland which argues for pre renal -  ultrasound indicates cortical thinning on left kidney only  - Hold diuretics and other BP meds  for now-  recheck orthostatics to determine if needs more IVF - considering right heart catheterization to better ascertain volume status -Patient can proceed with left heart catheterization at the discretion of cardiology-  is OK to do pre cath hydration -Contrast associated kidney injury is always a possibility.  I do not feel he needs significant hydration peri-procedurally  -Risk of significant renal failure purely from contrast is low.  Some rising creatinine would be expected but likely will be self-limited and improve with time  -Monitor Daily I/Os, Daily weight  -Maintain MAP>65 for optimal renal perfusion.  -Currently no indication for HD   Chest pain: Planning for [facility] with  cardiology.  Consider [facility] as well.  Follow-up echocardiogram   Hypokalemia: Hold diuretics and continue with supplementation as needed-  redosed today   Dizziness: orthostatic hypotension-  BP meds and diuretics on hold-  to recheck orthostatics today     Hyponatremia: Already improved with IVF.    Reche [place]    Labs: Basic Metabolic Panel: Recent Labs  Lab 07/23/23 1540 07/24/23 0148 07/24/23 1655 07/25/23 0908  NA 126* 132*  --  134*  K 2.5* 2.8* 3.1* 3.1*  CL 81* 86*  --  96*  CO2 28 30  --  25  GLUCOSE 182* 159*  --  187*  BUN 71* 76*  --  60*  CREATININE 2.68* 2.89*  --  2.30*  CALCIUM  9.6 9.6  --  8.9   Liver Function Tests: Recent Labs  Lab 07/23/23 1540 07/24/23 0148  AST 16 16  ALT 17 15  ALKPHOS 44 38  BILITOT 1.1 0.8  PROT 8.0 7.5  ALBUMIN 4.0 3.7   No results for input(s): LIPASE, AMYLASE in the last 168 hours. No results for input(s): AMMONIA in the last 168 hours. CBC: Recent Labs  Lab 07/18/23 1553 07/23/23 1540 07/24/23 0148 07/25/23 0908  WBC 10.9* 11.6* 10.8* 8.5  NEUTROABS  --  7.4  --   --   HGB 16.4 16.2 15.8 15.1  HCT 49.7 47.3 45.9 45.3  MCV 85  81.4 81.7 82.5  PLT 181 199 194 194   Cardiac Enzymes: No results for input(s): CKTOTAL, CKMB, CKMBINDEX, TROPONINI in the last 168 hours. CBG: No results for input(s): GLUCAP in the last 168 hours.  Iron Studies: No results for input(s): IRON, TIBC, TRANSFERRIN, FERRITIN in the last 72 hours. Studies/Results: ECHOCARDIOGRAM LIMITED Result Date: 07/24/2023    ECHOCARDIOGRAM LIMITED REPORT   Patient Name:   ORON WESTRUP Date of Exam: 07/24/2023 Medical Rec #:  [id number]      Height:       71.0 in Accession #:    [id number]     Weight:       278.6 lb Date of Birth:  Dec 12, 1955     BSA:          2.428 m Patient Age:    67 years       BP:           117/81 mmHg Patient Gender: M              HR:           75 bpm. Exam Location:  Inpatient Procedure: 2D Echo,  Limited Echo, Cardiac Doppler, Color Doppler and            Intracardiac Opacification Agent (Both Spectral and Color Flow            Doppler were utilized during procedure). Indications:    chest pain  History:        Patient has prior history of Echocardiogram examinations, most                 recent 01/22/2023.  Sonographer:    Griselda Lederer Referring Phys: [id number] MAHESH A CHANDRASEKHAR IMPRESSIONS  1. Left ventricular ejection fraction, by estimation, is 50 to 55%. The left ventricle has low normal function.  2. Right ventricular systolic function is mildly reduced. The right ventricular size is mildly enlarged.  3. The mitral valve is normal in structure. No evidence of mitral valve regurgitation.  4. The aortic valve has been repaired/replaced. Aortic valve mean gradient measures 7.0 mmHg. Aortic valve Vmax measures 1.75 m/s. FINDINGS  Left Ventricle: Left ventricular ejection fraction, by estimation, is 50 to 55%. The left ventricle has low normal function. Right Ventricle: The right ventricular size is mildly enlarged. Right ventricular systolic function is mildly reduced. Left Atrium: Left atrial size was not well visualized. Right Atrium: Right atrial size was normal in size. Pericardium: There is no evidence of pericardial effusion. Mitral Valve: The mitral valve is normal in structure. Mild mitral annular calcification. Tricuspid Valve: The tricuspid valve is normal in structure. Tricuspid valve regurgitation is trivial. Aortic Valve: The aortic valve has been repaired/replaced. Aortic valve mean gradient measures 7.0 mmHg. Aortic valve peak gradient measures 12.2 mmHg. Aortic valve area, by VTI measures 2.21 cm. There is a 26 mm Sapien prosthetic, stented (TAVR) valve present in the aortic position. Pulmonic Valve: The pulmonic valve was not well visualized. Aorta: The aortic root is normal in size and structure. LEFT VENTRICLE PLAX 2D LVOT diam:     2.00 cm LV SV:         70 LV SV Index:   29 LVOT  Area:     3.14 cm  LV Volumes (MOD) LV vol d, MOD A2C: 130.0 ml LV vol d, MOD A4C: 161.0 ml LV vol s, MOD A2C: 54.8 ml LV vol s, MOD A4C: 91.8 ml LV SV MOD A2C:  75.2 ml LV SV MOD A4C:     161.0 ml LV SV MOD BP:      76.5 ml AORTIC VALVE AV Area (Vmax):    2.21 cm AV Area (Vmean):   2.11 cm AV Area (VTI):     2.21 cm AV Vmax:           175.00 cm/s AV Vmean:          118.000 cm/s AV VTI:            0.319 m AV Peak Grad:      12.2 mmHg AV Mean Grad:      7.0 mmHg LVOT Vmax:         123.00 cm/s LVOT Vmean:        79.100 cm/s LVOT VTI:          0.224 m LVOT/AV VTI ratio: 0.70  SHUNTS Systemic VTI:  0.22 m Systemic Diam: 2.00 cm Jules Oar MD Electronically signed by Jules Oar MD Signature Date/Time: 07/24/2023/4:40:46 PM    Final    US  Renal Result Date: 07/23/2023 CLINICAL DATA:  Worsening renal function EXAM: RENAL / URINARY TRACT ULTRASOUND COMPLETE COMPARISON:  CT 12/04/2021, ultrasound 10/02/2016 FINDINGS: Right Kidney: Renal measurements: 12.8 x 5.5 x 5.9 cm = volume: 217.5 mL. Echogenicity within normal limits. No mass or hydronephrosis. Left Kidney: Renal measurements: 11 x 4.4 x 4.1 cm = volume: 104.7 mL. Cortex appears slightly echogenic. There is diffuse cortical thinning consistent with atrophy. No hydronephrosis. Cyst at the lower pole measuring 3.6 cm. Cyst at the upper pole measuring 4 cm. No imaging follow-up is recommended. Bladder: Appears normal for degree of bladder distention. Other: None. IMPRESSION: 1. No hydronephrosis. 2. Atrophic left kidney with cortical thinning and increased echogenicity consistent with chronic medical renal disease. Electronically Signed   By: Esmeralda Hedge M.D.   On: 07/23/2023 19:21   DG Chest 2 View Result Date: 07/23/2023 CLINICAL DATA:  Chest pain EXAM: CHEST - 2 VIEW COMPARISON:  03/10/2023 FINDINGS: TAVR. No acute airspace disease, pleural effusion or pneumothorax. Stable cardiomediastinal silhouette IMPRESSION: No active cardiopulmonary  disease. Electronically Signed   By: Esmeralda Hedge M.D.   On: 07/23/2023 15:56   Medications: Infusions:  sodium chloride  1 mL/kg/hr (07/25/23 0515)    Scheduled Medications:  aspirin   81 mg Oral Pre-Cath   isosorbide  mononitrate  30 mg Oral Daily   potassium chloride   40 mEq Oral Once   tamsulosin   0.4 mg Oral QPC breakfast    have reviewed scheduled and prn medications.  Physical Exam: General:  alert, sitting up Heart: RRR Lungs: clear Abdomen: soft, non tender- obese  Extremities: no edema    07/25/2023,11:13 AM  LOS: 0 days

## 2023-07-25 NOTE — Discharge Summary (Signed)
 Physician Discharge Summary  Stephen Maldonado:[id number] DOB: 10-16-1955 DOA: 07/23/2023  PCP: Benedetta Bradley, MD  Admit date: 07/23/2023 Discharge date: 07/25/2023  Admitted From: home Disposition:  home  Recommendations for Outpatient Follow-up:  Follow up with PCP in 1-2 weeks Follow-up with cardiology as scheduled Please repeat BMP within a week  Home Health: none Equipment/Devices: none  Discharge Condition: stable CODE STATUS: Full code Diet Orders (From admission, onward)     Start     Ordered   07/25/23 1159  Diet Heart Room service appropriate? Yes; Fluid consistency: Thin  Diet effective now       Question Answer Comment  Room service appropriate? Yes   Fluid consistency: Thin      07/25/23 1158            HPI: Per admitting MD, Stephen Maldonado is a 68 y.o. male with medical history significant of AAS status post TAVR, chronic kidney disease, prior DVT, HTN, obesity, BPH, known CAD with a positive stress test June 2024 who is supposed to get the cardiac cath as an outpatient comes into the hospital with an episode of chest pain most recent a week ago, relieved with sublingual nitroglycerin  as well as increased weakness and overall fatigue.  This was associated with couple episodes of nausea.  Even coming to the ER and standing up from his car became very dizzy and weak.  He spoke today with cardiologist, given persistent symptoms, need for cardiac cath, underlying renal failure he was directed to the ER.  On my evaluation he does not have any chest pain, no nausea or vomiting.  He denies any lower extremity swelling.  He reports compliance with his home medications   Hospital Course / Discharge diagnoses: Principal problem Chest pain, concern for underlying CAD -patient was admitted to the hospital with an episode of chest pain last week, relieved by sublingual nitroglycerin , and also with increased dizziness especially with standing.  He was found to be  orthostatic in the ER.  Cardiology consulted and evaluated patient while hospitalized, there were initial plans for cardiac catheterization however after discussing risks/benefits, for now it is preferred to move forward with medical management, especially in light of his chronic kidney disease.  He has remained chest pain-free here in the hospital, troponin was low and flat, and will be discharged home in stable condition.   Active problems Hyponatremia -suspect he was on the dry side given use of furosemide , given his hypokalemia as well. He received limited fluids, sodium now better and almost back to normal.  Will decrease the furosemide  dose at home upon discharge  Hypokalemia-due to diuretics, K replaced prior to discharge.  Would be cautious with standing potassium replacement at home given chronic kidney disease, recommend repeat renal function and potassium levels within a week Dizziness, weakness -He was orthostatic on admission, systolic dropped into 80s, and he was symptomatic.  He has a history of fluid overload, was given limited fluids and blood pressure is now improved.  Discussed with cardiology, will discontinue hydralazine , diltiazem , use Coreg  and Lasix  at half dosing  Essential hypertension-blood pressure now stable  Hyperlipidemia -seems to have intolerance to statin Obesity, class II-BMI 38, he would benefit from weight loss Chronic kidney disease stage IV -prior creatinines back in 2023 were in the 1.5-1.6 range.  Most recent outpatient creatinine earlier 2.8-3.2 based on CKA and cards results.  His baseline is in the mid twos.  Recommend repeat renal function within the next  week upon discharge  Sepsis ruled out   Discharge Instructions   Allergies as of 07/25/2023       Reactions   Atorvastatin  Other (See Comments)   Muscle fatigue, soreness, nightmares/crazy dreams, severe arthralgias/myalgias   Aspirin  Other (See Comments)   Avoids due to reduced kidney function    Simvastatin  Other (See Comments)   Fatigue, soreness, nightmares   Statins Other (See Comments)   Fatigue, soreness, and nightmares        Medication List     STOP taking these medications    diltiazem  240 MG 24 hr tablet Commonly known as: CARDIZEM  LA   hydrALAZINE  25 MG tablet Commonly known as: APRESOLINE        TAKE these medications    Allergy Eye 0.025-0.3 % ophthalmic solution Generic drug: naphazoline-pheniramine Place 1 drop into both eyes 4 (four) times daily as needed (for seasonal allergies).   allopurinol 100 MG tablet Commonly known as: ZYLOPRIM Take 200 mg by mouth daily as needed (for gout).   amoxicillin  500 MG tablet Commonly known as: AMOXIL  Take 4 tablets (2,000 mg total) by mouth as directed. 1 hour prior to dental work including cleanings What changed:  when to take this additional instructions   apixaban  5 MG Tabs tablet Commonly known as: Eliquis  Take 1 tablet (5 mg total) by mouth 2 (two) times daily.   carvedilol  12.5 MG tablet Commonly known as: COREG  Take 0.5 tablets (6.25 mg total) by mouth 2 (two) times daily with a meal. What changed: how much to take   clopidogrel 75 MG tablet Commonly known as: Plavix Take 1 tablet (75 mg total) by mouth daily.   CoQ-10 200 MG Caps Take 200 mg by mouth daily.   cyclobenzaprine 10 MG tablet Commonly known as: FLEXERIL Take 10 mg by mouth 3 (three) times daily as needed for muscle spasms.   Fish Oil 1000 MG Caps Take 1,000 mg by mouth daily.   furosemide  80 MG tablet Commonly known as: LASIX  Take 0.5 tablets (40 mg total) by mouth 2 (two) times daily. What changed: how much to take   Garlic  1000 MG Caps Take 1,000 mg by mouth daily.   isosorbide  mononitrate 30 MG 24 hr tablet Commonly known as: IMDUR  Take 1 tablet (30 mg total) by mouth daily.   Lecithin 400 MG Caps Take 400 mg by mouth at bedtime.   metolazone  5 MG tablet Commonly known as: ZAROXOLYN  Take 1 tablet (5 mg  total) by mouth every other day. Take on Mondays, Wednesdays, and Fridays. What changed:  when to take this additional instructions   Ozempic  (0.25 or 0.5 MG/DOSE) 2 MG/3ML Sopn Generic drug: Semaglutide (0.25 or 0.5MG /DOS) 0.25 mg.   Potassium Chloride  ER 20 MEQ Tbcr Take 20 mEq by mouth 2 (two) times daily. with food   tamsulosin  0.4 MG Caps capsule Commonly known as: FLOMAX  Take 0.4 mg by mouth every morning.   Turmeric 500 MG Tabs Take 500 mg by mouth daily.   VITAMIN B-1 PO Take 1 tablet by mouth daily at 6 (six) AM.   vitamin C  1000 MG tablet Take 1,000 mg by mouth daily.   Vitamin D3 1000 units Caps Take 1,000 Units by mouth daily.       Consultations: Cardiology  Nephrology  Procedures/Studies:  ECHOCARDIOGRAM LIMITED Result Date: 07/24/2023    ECHOCARDIOGRAM LIMITED REPORT   Patient Name:   ADONYS WILDES Date of Exam: 07/24/2023 Medical Rec #:  [id number]      Height:  71.0 in Accession #:    [id number]     Weight:       278.6 lb Date of Birth:  1955-04-17     BSA:          2.428 m Patient Age:    67 years       BP:           117/81 mmHg Patient Gender: M              HR:           75 bpm. Exam Location:  Inpatient Procedure: 2D Echo, Limited Echo, Cardiac Doppler, Color Doppler and            Intracardiac Opacification Agent (Both Spectral and Color Flow            Doppler were utilized during procedure). Indications:    chest pain  History:        Patient has prior history of Echocardiogram examinations, most                 recent 01/22/2023.  Sonographer:    Griselda Lederer Referring Phys: [id number] MAHESH A CHANDRASEKHAR IMPRESSIONS  1. Left ventricular ejection fraction, by estimation, is 50 to 55%. The left ventricle has low normal function.  2. Right ventricular systolic function is mildly reduced. The right ventricular size is mildly enlarged.  3. The mitral valve is normal in structure. No evidence of mitral valve regurgitation.  4. The aortic valve has been  repaired/replaced. Aortic valve mean gradient measures 7.0 mmHg. Aortic valve Vmax measures 1.75 m/s. FINDINGS  Left Ventricle: Left ventricular ejection fraction, by estimation, is 50 to 55%. The left ventricle has low normal function. Right Ventricle: The right ventricular size is mildly enlarged. Right ventricular systolic function is mildly reduced. Left Atrium: Left atrial size was not well visualized. Right Atrium: Right atrial size was normal in size. Pericardium: There is no evidence of pericardial effusion. Mitral Valve: The mitral valve is normal in structure. Mild mitral annular calcification. Tricuspid Valve: The tricuspid valve is normal in structure. Tricuspid valve regurgitation is trivial. Aortic Valve: The aortic valve has been repaired/replaced. Aortic valve mean gradient measures 7.0 mmHg. Aortic valve peak gradient measures 12.2 mmHg. Aortic valve area, by VTI measures 2.21 cm. There is a 26 mm Sapien prosthetic, stented (TAVR) valve present in the aortic position. Pulmonic Valve: The pulmonic valve was not well visualized. Aorta: The aortic root is normal in size and structure. LEFT VENTRICLE PLAX 2D LVOT diam:     2.00 cm LV SV:         70 LV SV Index:   29 LVOT Area:     3.14 cm  LV Volumes (MOD) LV vol d, MOD A2C: 130.0 ml LV vol d, MOD A4C: 161.0 ml LV vol s, MOD A2C: 54.8 ml LV vol s, MOD A4C: 91.8 ml LV SV MOD A2C:     75.2 ml LV SV MOD A4C:     161.0 ml LV SV MOD BP:      76.5 ml AORTIC VALVE AV Area (Vmax):    2.21 cm AV Area (Vmean):   2.11 cm AV Area (VTI):     2.21 cm AV Vmax:           175.00 cm/s AV Vmean:          118.000 cm/s AV VTI:            0.319 m AV Peak Grad:  12.2 mmHg AV Mean Grad:      7.0 mmHg LVOT Vmax:         123.00 cm/s LVOT Vmean:        79.100 cm/s LVOT VTI:          0.224 m LVOT/AV VTI ratio: 0.70  SHUNTS Systemic VTI:  0.22 m Systemic Diam: 2.00 cm Jules Oar MD Electronically signed by Jules Oar MD Signature Date/Time: 07/24/2023/4:40:46 PM     Final    US  Renal Result Date: 07/23/2023 CLINICAL DATA:  Worsening renal function EXAM: RENAL / URINARY TRACT ULTRASOUND COMPLETE COMPARISON:  CT 12/04/2021, ultrasound 10/02/2016 FINDINGS: Right Kidney: Renal measurements: 12.8 x 5.5 x 5.9 cm = volume: 217.5 mL. Echogenicity within normal limits. No mass or hydronephrosis. Left Kidney: Renal measurements: 11 x 4.4 x 4.1 cm = volume: 104.7 mL. Cortex appears slightly echogenic. There is diffuse cortical thinning consistent with atrophy. No hydronephrosis. Cyst at the lower pole measuring 3.6 cm. Cyst at the upper pole measuring 4 cm. No imaging follow-up is recommended. Bladder: Appears normal for degree of bladder distention. Other: None. IMPRESSION: 1. No hydronephrosis. 2. Atrophic left kidney with cortical thinning and increased echogenicity consistent with chronic medical renal disease. Electronically Signed   By: Esmeralda Hedge M.D.   On: 07/23/2023 19:21   DG Chest 2 View Result Date: 07/23/2023 CLINICAL DATA:  Chest pain EXAM: CHEST - 2 VIEW COMPARISON:  03/10/2023 FINDINGS: TAVR. No acute airspace disease, pleural effusion or pneumothorax. Stable cardiomediastinal silhouette IMPRESSION: No active cardiopulmonary disease. Electronically Signed   By: Esmeralda Hedge M.D.   On: 07/23/2023 15:56     Subjective: - no chest pain, shortness of breath, no abdominal pain, nausea or vomiting.   Discharge Exam: BP 119/69 (BP Location: Left Arm)   Pulse 73   Temp 97.9 F (36.6 C) (Oral)   Resp 18   Wt 124.7 kg   SpO2 99%   BMI 38.35 kg/m   General: Pt is alert, awake, not in acute distress Cardiovascular: RRR, S1/S2 +, no rubs, no gallops Respiratory: CTA bilaterally, no wheezing, no rhonchi Abdominal: Soft, NT, ND, bowel sounds + Extremities: no edema, no cyanosis    The results of significant diagnostics from this hospitalization (including imaging, microbiology, ancillary and laboratory) are listed below for reference.      Microbiology: No results found for this or any previous visit (from the past 240 hours).   Labs: Basic Metabolic Panel: Recent Labs  Lab 07/18/23 1553 07/23/23 1540 07/24/23 0148 07/24/23 1655 07/25/23 0908  NA 131* 126* 132*  --  134*  K 3.0* 2.5* 2.8* 3.1* 3.1*  CL 83* 81* 86*  --  96*  CO2 24 28 30   --  25  GLUCOSE 138* 182* 159*  --  187*  BUN 67* 71* 76*  --  60*  CREATININE 3.21* 2.68* 2.89*  --  2.30*  CALCIUM  10.2 9.6 9.6  --  8.9  MG  --   --  2.2  --  2.2   Liver Function Tests: Recent Labs  Lab 07/23/23 1540 07/24/23 0148  AST 16 16  ALT 17 15  ALKPHOS 44 38  BILITOT 1.1 0.8  PROT 8.0 7.5  ALBUMIN 4.0 3.7   CBC: Recent Labs  Lab 07/18/23 1553 07/23/23 1540 07/24/23 0148 07/25/23 0908  WBC 10.9* 11.6* 10.8* 8.5  NEUTROABS  --  7.4  --   --   HGB 16.4 16.2 15.8 15.1  HCT 49.7 47.3 45.9  45.3  MCV 85 81.4 81.7 82.5  PLT 181 199 194 194   CBG: No results for input(s): GLUCAP in the last 168 hours. Hgb A1c No results for input(s): HGBA1C in the last 72 hours. Lipid Profile No results for input(s): CHOL, HDL, LDLCALC, TRIG, CHOLHDL, LDLDIRECT in the last 72 hours. Thyroid  function studies No results for input(s): TSH, T4TOTAL, T3FREE, THYROIDAB in the last 72 hours.  Invalid input(s): FREET3 Urinalysis    Component Value Date/Time   COLORURINE YELLOW 07/23/2023 1540   APPEARANCEUR CLEAR 07/23/2023 1540   LABSPEC 1.008 07/23/2023 1540   PHURINE 6.0 07/23/2023 1540   GLUCOSEU NEGATIVE 07/23/2023 1540   HGBUR NEGATIVE 07/23/2023 1540   BILIRUBINUR NEGATIVE 07/23/2023 1540   KETONESUR NEGATIVE 07/23/2023 1540   PROTEINUR NEGATIVE 07/23/2023 1540   NITRITE NEGATIVE 07/23/2023 1540   LEUKOCYTESUR NEGATIVE 07/23/2023 1540    FURTHER DISCHARGE INSTRUCTIONS:   Get Medicines reviewed and adjusted: Please take all your medications with you for your next visit with your Primary MD   Laboratory/radiological  data: Please request your Primary MD to go over all hospital tests and procedure/radiological results at the follow up, please ask your Primary MD to get all Hospital records sent to his/her office.   In some cases, they will be blood work, cultures and biopsy results pending at the time of your discharge. Please request that your primary care M.D. goes through all the records of your hospital data and follows up on these results.   Also Note the following: If you experience worsening of your admission symptoms, develop shortness of breath, life threatening emergency, suicidal or homicidal thoughts you must seek medical attention immediately by calling 911 or calling your MD immediately  if symptoms less severe.   You must read complete instructions/literature along with all the possible adverse reactions/side effects for all the Medicines you take and that have been prescribed to you. Take any new Medicines after you have completely understood and accpet all the possible adverse reactions/side effects.    Do not drive when taking Pain medications or sleeping medications (Benzodaizepines)   Do not take more than prescribed Pain, Sleep and Anxiety Medications. It is not advisable to combine anxiety,sleep and pain medications without talking with your primary care practitioner   Special Instructions: If you have smoked or chewed Tobacco  in the last 2 yrs please stop smoking, stop any regular Alcohol  and or any Recreational drug use.   Wear Seat belts while driving.   Please note: You were cared for by a hospitalist during your hospital stay. Once you are discharged, your primary care physician will handle any further medical issues. Please note that NO REFILLS for any discharge medications will be authorized once you are discharged, as it is imperative that you return to your primary care physician (or establish a relationship with a primary care physician if you do not have one) for your post  hospital discharge needs so that they can reassess your need for medications and monitor your lab values.  Time coordinating discharge: 40 minutes  SIGNED:  Kathlen Para, MD, PhD 07/25/2023, 12:48 PM

## 2023-07-26 LAB — HIV-1/2 AB - DIFFERENTIATION
HIV 1 Ab: NONREACTIVE
HIV 2 Ab: NONREACTIVE
Note: NEGATIVE

## 2023-07-26 LAB — HIV-1/HIV-2 QUALITATIVE RNA
Final Interpretation: NEGATIVE
HIV-1 RNA, Qualitative: NONREACTIVE
HIV-2 RNA, Qualitative: NONREACTIVE

## 2023-07-30 ENCOUNTER — Ambulatory Visit: Attending: Pharmacist | Admitting: Pharmacist

## 2023-07-30 NOTE — Progress Notes (Deleted)
 Patient ID: Stephen Maldonado                 DOB: 24-Feb-1955                    MRN: [id number]      HPI: Stephen Maldonado is a 68 y.o. male patient referred to PharmD clinic by Dr. Elmira. PMH is significant for hypertension, hyperlipidemia, moderate CAD, type II DM, h/o NICM with recovered LVEF, PAF, moderate obesity, OSA, s/p TAVR for severe AS.   Recently had episode of chest pain. Cardiac cath was planned but due to kidney function was not preformed. He also was having epiosdes of dizziness and weakness. We advised he go to the ER. He was hyponatremic and hypokalemic on presentation. Lights fluids given and K replaced. His furosemide  and carvedilol  dose was decreased and his hydralazine  and diltiazem  were stopped.  Pt with hx of statin intolerance. Last LDL-C 127. TG were high, but I imagine labs may not have been fasting.   Ozmepic? How does he get? Lost 50lb? BP at home? Lipid meds    Reviewed options for lowering LDL cholesterol, including ezetimibe, PCSK-9 inhibitors, bempedoic acid and inclisiran.  Discussed mechanisms of action, dosing, side effects and potential decreases in LDL cholesterol.  Also reviewed cost information and potential options for patient assistance.  Current Lipid Medications: none Current BP medications: carvedilol  6.25mg  BID, furosemide  40mg  BID, metolazone ? Intolerances: atorvastatin  and simvastatin  (muscle fatigue, soreness, crazy dreams) Risk Factors: CAD, angina, DM LDL-C goal: <70 ApoB goal:   Diet:   Exercise:   Family History:   Social History:   Labs: Lipid Panel     Component Value Date/Time   CHOL 205 (H) 07/18/2023 1553   TRIG 222 (H) 07/18/2023 1553   HDL 38 (L) 07/18/2023 1553   CHOLHDL 5.4 (H) 07/18/2023 1553   CHOLHDL 3.5 07/11/2016 0232   VLDL 15 07/11/2016 0232   LDLCALC 127 (H) 07/18/2023 1553   LABVLDL 40 07/18/2023 1553    Past Medical History:  Diagnosis Date   Arthritis    knees   Bladder spasms    BPH (benign  prostatic hyperplasia)    DVT (deep venous thrombosis) (HCC) ocyt 2016   left leg tx medically   GERD (gastroesophageal reflux disease)    History of colon polyps    hyperplastic 06-14-2010   Hydronephrosis    Hypertension    Obesity, morbid (HCC)    S/P TAVR (transcatheter aortic valve replacement) 01/15/2022   s/p TAVR with a 26 mm Edwards S3UR via the TF approach by Dr. Verlin & Dr. Murriel   Urinary retention     Current Outpatient Medications on File Prior to Visit  Medication Sig Dispense Refill   allopurinol (ZYLOPRIM) 100 MG tablet Take 200 mg by mouth daily as needed (for gout).     amoxicillin  (AMOXIL ) 500 MG tablet Take 4 tablets (2,000 mg total) by mouth as directed. 1 hour prior to dental work including cleanings (Patient taking differently: Take 2,000 mg by mouth See admin instructions. Take , mg by mouth 1 hour prior to dental work, including cleanings) 12 tablet 12   apixaban  (ELIQUIS ) 5 MG TABS tablet Take 1 tablet (5 mg total) by mouth 2 (two) times daily. 180 tablet 1   Ascorbic Acid  (VITAMIN C ) 1000 MG tablet Take 1,000 mg by mouth daily.     carvedilol  (COREG ) 12.5 MG tablet Take 0.5 tablets (6.25 mg total) by mouth 2 (two) times  daily with a meal.     Cholecalciferol  (VITAMIN D3) 1000 units CAPS Take 1,000 Units by mouth daily.     clopidogrel  (PLAVIX ) 75 MG tablet Take 1 tablet (75 mg total) by mouth daily. 90 tablet 3   Coenzyme Q10 (COQ-10) 200 MG CAPS Take 200 mg by mouth daily.     cyclobenzaprine (FLEXERIL) 10 MG tablet Take 10 mg by mouth 3 (three) times daily as needed for muscle spasms.     furosemide  (LASIX ) 80 MG tablet Take 0.5 tablets (40 mg total) by mouth 2 (two) times daily.     Garlic  1000 MG CAPS Take 1,000 mg by mouth daily.     isosorbide  mononitrate (IMDUR ) 30 MG 24 hr tablet Take 1 tablet (30 mg total) by mouth daily. 90 tablet 3   Lecithin 400 MG CAPS Take 400 mg by mouth at bedtime.     metolazone  (ZAROXOLYN ) 5 MG tablet Take 1 tablet (5 mg  total) by mouth every other day. Take on Mondays, Wednesdays, and Fridays. (Patient taking differently: Take 5 mg by mouth every Monday, Wednesday, and Friday.) 36 tablet 2   naphazoline-pheniramine (ALLERGY EYE) 0.025-0.3 % ophthalmic solution Place 1 drop into both eyes 4 (four) times daily as needed (for seasonal allergies).     Omega-3 Fatty Acids (FISH OIL) 1000 MG CAPS Take 1,000 mg by mouth daily.      Potassium Chloride  ER 20 MEQ TBCR Take 20 mEq by mouth 2 (two) times daily. with food     Semaglutide ,0.25 or 0.5MG /DOS, (OZEMPIC , 0.25 OR 0.5 MG/DOSE,) 2 MG/3ML SOPN 0.25 mg. (Patient not taking: Reported on 07/23/2023)     tamsulosin  (FLOMAX ) 0.4 MG CAPS capsule Take 0.4 mg by mouth every morning.     Thiamine HCl (VITAMIN B-1 PO) Take 1 tablet by mouth daily at 6 (six) AM.     Turmeric 500 MG TABS Take 500 mg by mouth daily.     No current facility-administered medications on file prior to visit.    Allergies  Allergen Reactions   Atorvastatin  Other (See Comments)    Muscle fatigue, soreness, nightmares/crazy dreams, severe arthralgias/myalgias   Aspirin  Other (See Comments)    Avoids due to reduced kidney function   Simvastatin  Other (See Comments)    Fatigue, soreness, nightmares   Statins Other (See Comments)    Fatigue, soreness, and nightmares    Assessment/Plan:  1. Hyperlipidemia -  No problem-specific Assessment & Plan notes found for this encounter.    Thank you,  Dee Paden D Krystofer Hevener, Pharm.JONETTA SARAN, CPP [facility] [address], [place], [place] [address]  Phone: [phone]; Fax: [phone]

## 2023-08-01 ENCOUNTER — Telehealth: Payer: Self-pay | Admitting: Pharmacist

## 2023-08-01 NOTE — Progress Notes (Signed)
   08/01/2023  Patient ID: Stephen Maldonado, male   DOB: [birth year], 68 y.o.   MRN: [id number]  Called and spoke with the patient on the phone regarding bringing proof of income for Ozempic  application to visit on Monday. [name] he will not qualify if including his wife's income as she makes too much. Therefore, will have to close out the application for Ozempic  PAP. Said he was going to get another sample and extra pen needles at PCP visit. Requesting assistance with getting medications corrected at the pharmacy since his hospital discharge. Will see how the visit goes on Monday at 1:30PM and correct prescriptions with pharmacy after this time. Plan to call in about 2 weeks for DM review.   Aloysius Lewis, PharmD [facility]  Phone Number: [phone]

## 2023-08-04 ENCOUNTER — Encounter: Payer: Self-pay | Admitting: Cardiology

## 2023-08-04 ENCOUNTER — Ambulatory Visit: Attending: Cardiology | Admitting: Cardiology

## 2023-08-04 VITALS — BP 123/75 | HR 84 | Ht 71.0 in | Wt 277.0 lb

## 2023-08-04 DIAGNOSIS — I2510 Atherosclerotic heart disease of native coronary artery without angina pectoris: Secondary | ICD-10-CM | POA: Diagnosis not present

## 2023-08-04 DIAGNOSIS — I10 Essential (primary) hypertension: Secondary | ICD-10-CM | POA: Diagnosis not present

## 2023-08-04 DIAGNOSIS — I5022 Chronic systolic (congestive) heart failure: Secondary | ICD-10-CM | POA: Diagnosis not present

## 2023-08-04 DIAGNOSIS — I25118 Atherosclerotic heart disease of native coronary artery with other forms of angina pectoris: Secondary | ICD-10-CM | POA: Diagnosis not present

## 2023-08-04 DIAGNOSIS — E876 Hypokalemia: Secondary | ICD-10-CM | POA: Diagnosis not present

## 2023-08-04 DIAGNOSIS — I509 Heart failure, unspecified: Secondary | ICD-10-CM | POA: Diagnosis not present

## 2023-08-04 DIAGNOSIS — E1159 Type 2 diabetes mellitus with other circulatory complications: Secondary | ICD-10-CM | POA: Diagnosis not present

## 2023-08-04 DIAGNOSIS — I4891 Unspecified atrial fibrillation: Secondary | ICD-10-CM | POA: Diagnosis not present

## 2023-08-04 DIAGNOSIS — I480 Paroxysmal atrial fibrillation: Secondary | ICD-10-CM | POA: Diagnosis not present

## 2023-08-04 DIAGNOSIS — R42 Dizziness and giddiness: Secondary | ICD-10-CM | POA: Diagnosis not present

## 2023-08-04 DIAGNOSIS — E871 Hypo-osmolality and hyponatremia: Secondary | ICD-10-CM | POA: Diagnosis not present

## 2023-08-04 NOTE — Progress Notes (Deleted)
 Cardiology Office Note:  .   Date:  08/04/2023  ID:  Stephen Maldonado, DOB 1955/07/20, MRN [id number] PCP: Charlott Dorn DELENA, MD  [facility] Providers Cardiologist:  Newman Lawrence, MD PCP: Charlott Dorn DELENA, MD  No chief complaint on file.     History of Present Illness: .    Stephen Maldonado is a 68 y.o. male with hypertension, hyperlipidemia, moderate CAD, type II DM, h/o NICM with recovered LVEF, PAF, moderate obesity, OSA, s/p TAVR for severe AS   Patient was hospitalized in 07/2018. Please see my 07/25/2023 inpatient note below.  I had a long conversation with the patient. He does have obstructive CAD but his primary complaint at this time is dizziness which is unrelated. I don't rule out possibility of carb in the future should he have any recurrent chest pain or worsening dyspnea, provided renal function stays how it is today, but we have mutually decided to hold off the cath for now. From my side, he could go home. I will see him outpatient.   He could stay on Plavix  and Eliquis  without the aspirin  and current anti angina regimen, unless you feel any of those medications could be contributing to his dizziness-especially coreg  or hydralazine .     There were no vitals filed for this visit.      ROS:  Review of Systems  Cardiovascular:  Positive for chest pain. Negative for dyspnea on exertion, leg swelling, palpitations and syncope.     Studies Reviewed: SABRA        EKG 07/18/2023: Sinus rhythm with 1st degree A-V block with occasional Premature ventricular complexes Incomplete right bundle branch block Left posterior fascicular block Nonspecific ST abnormality Prolonged QT When compared with ECG of 17-Apr-2023 16:10, Premature ventricular complexes are now Present Left posterior fascicular block is now Present   Risk Assessment/Calculations:    CHA2DS2-VASc Score = 3  This indicates a 3.23.2% annual risk of stroke. The patient's score is based  upon: CHF History: 1 HTN History: 1 Diabetes History: 0 Stroke History: 0 Vascular Disease History: 0 Age Score: 1 Gender Score: 0   Labs 07/2023: Chol 205, TG 222, HDL 38, LDL 127 Hb 15.1 Cr 2.3, Na/K 134/3.1 ***  10/2022-03/2023: Chol 173, TG 215, HDL 38, LDL 98 HbA1C 10.6% Hb 13.8 BUN 35, eGFR 33 K 4.7  Echocardiogram 01/2023: 1. Left ventricular ejection fraction, by estimation, is 55 to 60%. The  left ventricle has normal function. Left ventricular endocardial border  not optimally defined to evaluate regional wall motion. There is mild left  ventricular hypertrophy. Left  ventricular diastolic parameters are consistent with Grade I diastolic  dysfunction (impaired relaxation). Elevated left atrial pressure.   2. Right ventricular systolic function is normal. The right ventricular  size is normal.   3. The mitral valve is normal in structure. No evidence of mitral valve  regurgitation. No evidence of mitral stenosis.   4. Edwards Sapien 3 THV size 26 mm is in the AV position. Mean gradient  11 mmHg is within normal published range and stable from Mar 06, 2022  study. . The aortic valve has been repaired/replaced. Aortic valve  regurgitation is not visualized. No aortic  stenosis is present.   5. The inferior vena cava is normal in size with greater than 50%  respiratory variability, suggesting right atrial pressure of 3 mmHg.   Physical Exam:   Physical Exam Vitals and nursing note reviewed.  Constitutional:      General: He  is not in acute distress. Neck:     Vascular: No JVD.   Cardiovascular:     Rate and Rhythm: Normal rate and regular rhythm.     Heart sounds: Murmur heard.     Harsh midsystolic murmur is present with a grade of 2/6 at the upper right sternal border radiating to the neck.  Pulmonary:     Effort: Pulmonary effort is normal.     Breath sounds: Normal breath sounds. No wheezing or rales.   Musculoskeletal:     Right lower leg: No edema.      Left lower leg: No edema.      VISIT DIAGNOSES: No diagnosis found.     ASSESSMENT AND PLAN: .    Stephen Maldonado is a 68 y.o. male with hypertension, hyperlipidemia, moderate CAD, h/o NICM with recovered LVEF, PAF, moderate obesity, OSA, now s/p TAVR for severe AS   *** Chest pain: Known CAD noted on pre-TAVR cath in 2023, especially moderate to severe stenosis in mid left circumflex, mild to moderate disease in RCA. Although stress test in 2024 showed infarct without ischemia, his current symptoms with chest pain at rest resolving nitroglycerin  are concerning for unstable angina. I recommend coronary angiography with consideration for intervention.  Obviously, we have to be cautious given his renal function and CKD stage IIIb. I recommend plan cardiac authorization after additional hydration to reduce contrast-induced nephropathy.  I have reviewed the risks, indications, and alternatives to cardiac catheterization, possible angioplasty, and stenting with the patient. Risks include but are not limited to bleeding, infection, vascular injury, stroke, myocardial infection, arrhythmia, kidney injury, radiation-related injury in the case of prolonged fluoroscopy use, emergency cardiac surgery, and death. The patient understands the risks of serious complication is 1-2 in 1000 with diagnostic cardiac cath and 1-2% or less with angioplasty/stenting.   In the meantime, recommend antiplatelet therapy.  He reportedly has aspirin  allergy, therefore started on Plavix  75 mg daily. Continue carvedilol , Imdur , added sublingual nitroglycerin .  Will check labs including CBC, BMP, proBNP, lipid panel, high sensitive troponin.  Between now and cardiac catheterization next week, if he has any recurrent chest pain, strongly recommend calling 911 and going to emergency room immediately.  *** Aortic stenosis: S/p TAVR Edwards Sapien 3 THV (size 26 mm) (01/2022). Valve functioning well based on  physical exam and echocardiogram 01/2023.   *** Paroxysmal Afib: He has been been off Eliquis  due to cost issues, but recently started again.   Continue eliquis  5 mg bid.  Okay to hold before cardiac catheterization.   *** Essential hypertension: Controlled.   *** OSA on CPAP: Tolerating well  F/u ***  Signed, Newman JINNY Lawrence, MD

## 2023-08-04 NOTE — Progress Notes (Signed)
 Cardiology Office Note:  .   Date:  08/04/2023  ID:  Stephen Maldonado, DOB 1956/01/25, MRN [id number] PCP: Charlott Dorn DELENA, MD  [facility] Providers Cardiologist:  Newman Lawrence, MD PCP: Charlott Dorn DELENA, MD  Chief Complaint  Patient presents with   Coronary Artery Disease      History of Present Illness: .    Stephen Maldonado is a 68 y.o. male with hypertension, hyperlipidemia, moderate CAD, type II DM, h/o NICM with recovered LVEF, PAF, moderate obesity, OSA, s/p TAVR for severe AS   Patient was hospitalized in 07/2018. Please see my 07/25/2023 inpatient note below.  I had a long conversation with the patient. He does have obstructive CAD but his primary complaint at this time is dizziness which is unrelated. I don't rule out possibility of carb in the future should he have any recurrent chest pain or worsening dyspnea, provided renal function stays how it is today, but we have mutually decided to hold off the cath for now. From my side, he could go home. I will see him outpatient.   He could stay on Plavix  and Eliquis  without the aspirin  and current anti angina regimen, unless you feel any of those medications could be contributing to his dizziness-especially coreg  or hydralazine .  Dizziness has improved, he is not having any new chest pain.  However, he continues to have exertional dyspnea.     Vitals:   08/04/23 1133  BP: 123/75  Pulse: 84  SpO2: 94%        ROS:  Review of Systems  Cardiovascular:  Positive for dyspnea on exertion. Negative for chest pain, leg swelling, palpitations and syncope.     Studies Reviewed: SABRA        EKG 07/18/2023: Sinus rhythm with 1st degree A-V block with occasional Premature ventricular complexes Incomplete right bundle branch block Left posterior fascicular block Nonspecific ST abnormality Prolonged QT When compared with ECG of 17-Apr-2023 16:10, Premature ventricular complexes are now Present Left  posterior fascicular block is now Present   Risk Assessment/Calculations:    CHA2DS2-VASc Score = 3  This indicates a 3.23.2% annual risk of stroke. The patient's score is based upon: CHF History: 1 HTN History: 1 Diabetes History: 0 Stroke History: 0 Vascular Disease History: 0 Age Score: 1 Gender Score: 0   Labs 07/2023: Chol 205, TG 222, HDL 38, LDL 127 Hb 15.1 Cr 2.3, Na/K 134/3.1  10/2022-03/2023: Chol 173, TG 215, HDL 38, LDL 98 HbA1C 10.6% Hb 13.8 BUN 35, eGFR 33 K 4.7  Echocardiogram 01/2023: 1. Left ventricular ejection fraction, by estimation, is 55 to 60%. The  left ventricle has normal function. Left ventricular endocardial border  not optimally defined to evaluate regional wall motion. There is mild left  ventricular hypertrophy. Left  ventricular diastolic parameters are consistent with Grade I diastolic  dysfunction (impaired relaxation). Elevated left atrial pressure.   2. Right ventricular systolic function is normal. The right ventricular  size is normal.   3. The mitral valve is normal in structure. No evidence of mitral valve  regurgitation. No evidence of mitral stenosis.   4. Edwards Sapien 3 THV size 26 mm is in the AV position. Mean gradient  11 mmHg is within normal published range and stable from Mar 06, 2022  study. . The aortic valve has been repaired/replaced. Aortic valve  regurgitation is not visualized. No aortic  stenosis is present.   5. The inferior vena cava is normal in size with greater  than 50%  respiratory variability, suggesting right atrial pressure of 3 mmHg.   Physical Exam:   Physical Exam Vitals and nursing note reviewed.  Constitutional:      General: He is not in acute distress. Neck:     Vascular: No JVD.   Cardiovascular:     Rate and Rhythm: Normal rate and regular rhythm.     Heart sounds: Murmur heard.     Harsh midsystolic murmur is present with a grade of 2/6 at the upper right sternal border radiating to  the neck.  Pulmonary:     Effort: Pulmonary effort is normal.     Breath sounds: Normal breath sounds. No wheezing or rales.   Musculoskeletal:     Right lower leg: No edema.     Left lower leg: No edema.      VISIT DIAGNOSES:   ICD-10-CM   1. Coronary artery disease involving native coronary artery of native heart without angina pectoris  I25.10     2. PAF (paroxysmal atrial fibrillation) (HCC)  I48.0          ASSESSMENT AND PLAN: .    Stephen Maldonado is a 68 y.o. male with hypertension, hyperlipidemia, CAD, h/o NICM with recovered LVEF, PAF, moderate obesity, OSA, now s/p TAVR for severe AS   CAD: Known moderate to severe obstructive disease as noted on prior cath in 2023. No chest pain symptoms at this time, but continues to have exertional dyspnea.  Intermittent possible that his exertional dyspnea is angina equivalent given that it has persisted even after 60 pound weight loss. Cardiac catheterization possible intervention remains an option, but patient remains unsure about this. He is going to have follow-up with nephrology regarding his renal function. If and when we decide to do cardiac catheterization, will need to weigh the risk of contrast-induced nephropathy. I would prefer to perform cardiac catheterization, if and when done, when creatinine is at its lowest around 2.3 or so.  In the meantime, recommend antiplatelet therapy.  He reportedly has aspirin  allergy, therefore started on Plavix  75 mg daily. Continue carvedilol , Imdur , added sublingual nitroglycerin .  Aortic stenosis: S/p TAVR Edwards Sapien 3 THV (size 26 mm) (01/2022). Valve functioning well.  Paroxysmal Afib: He has been been off Eliquis  due to cost issues, but recently started again.   Continue eliquis  5 mg bid.   Essential hypertension: Controlled.    OSA on CPAP: Tolerating well  F/u in 09/2023  Signed, Newman JINNY Lawrence, MD

## 2023-08-04 NOTE — Patient Instructions (Signed)
 Follow-Up: At [facility], you and your health needs are our priority.  As part of our continuing mission to provide you with exceptional heart care, our providers are all part of one team.  This team includes your primary Cardiologist (physician) and Advanced Practice Providers or APPs (Physician Assistants and Nurse Practitioners) who all work together to provide you with the care you need, when you need it.  Your next appointment:   Scheduled for August   Provider:   Newman JINNY Lawrence, MD    We recommend signing up for the patient portal called MyChart.  Sign up information is provided on this After Visit Summary.  MyChart is used to connect with patients for Virtual Visits (Telemedicine).  Patients are able to view lab/test results, encounter notes, upcoming appointments, etc.  Non-urgent messages can be sent to your provider as well.   To learn more about what you can do with MyChart, go to [email or web address].

## 2023-08-05 NOTE — Progress Notes (Signed)
 Called and spoke with the pharmacy today per patient request. Inactivated and scripts he should no longer be taking to avoid any confusion. Inactivated Hydralazine  and Metformin. Never had Diltiazem  script there.  Will verify with patient that he is doing Carvedilol  12.5mg  half tablet twice a day and Furosemide  80mg  half tablet twice a day prior to requesting new scripts from Cardiology. Will discuss in 2 weeks for DM call after restarting the Ozempic  sample.

## 2023-08-06 ENCOUNTER — Ambulatory Visit: Admitting: Physician Assistant

## 2023-08-14 ENCOUNTER — Telehealth: Payer: Self-pay | Admitting: Pharmacist

## 2023-08-14 NOTE — Telephone Encounter (Signed)
 Noted.  Patient has an appointment to see me on 09/12/2023.  Patient is aware to contact us  if he has recurrent chest pain and worsening shortness of breath symptoms and would like to proceed with cardiac catheterization, subject to renal function at that time.  Thanks MJP

## 2023-08-14 NOTE — Progress Notes (Signed)
   08/14/2023  Patient ID: Stephen Maldonado, male   DOB: Jun 14, 1955, 68 y.o.   MRN: [id number]  Called and spoke with the patient on the phone for referral for DM management from Dr. Charlott.  Patient reports he does not check his blood sugar and has no interest in starting a CGM to track it at this time. Therefore, will have no way to determine how readings look and medication efficacy.  [name] he has been focusing on his diet a lot more lately. Avoiding sweet and high carb foods. Will occasionally have a sandwich with the bread being his carbs. Drinks water or Gatorade Zero constantly.  For currently Ozempic , said he needs needles- can send Rx to the pharmacy. Taking 0.25mg  weekly. Advised with his leftover 1mg  pen in the fridge, he count to 18 clicks and inject that until the pen runs out to save him some extra.   Also advised that since he paid the $250 drug deductible for Eliquis , the Ozempic  will be $47 monthly going forward. States this should be affordable.   Potential future options are Glipizide XL or insulin . Adding SGLT2 inhibitor would be too expensive.   Final concern is getting recent labs to Cardiology as he was being evaluated for cardiac catheterization and wanted his kidney function concerns to resolve prior. Will send Cardiologist a message with results.    Aloysius Lewis, PharmD [facility]  Phone Number: [phone]

## 2023-09-12 ENCOUNTER — Encounter: Payer: Self-pay | Admitting: Cardiology

## 2023-09-12 ENCOUNTER — Ambulatory Visit: Attending: Cardiology | Admitting: Cardiology

## 2023-09-12 ENCOUNTER — Other Ambulatory Visit (HOSPITAL_COMMUNITY): Payer: Self-pay

## 2023-09-12 VITALS — BP 110/58 | HR 78 | Ht 70.5 in | Wt 281.0 lb

## 2023-09-12 DIAGNOSIS — I25118 Atherosclerotic heart disease of native coronary artery with other forms of angina pectoris: Secondary | ICD-10-CM | POA: Diagnosis not present

## 2023-09-12 DIAGNOSIS — I5022 Chronic systolic (congestive) heart failure: Secondary | ICD-10-CM

## 2023-09-12 DIAGNOSIS — I10 Essential (primary) hypertension: Secondary | ICD-10-CM

## 2023-09-12 MED ORDER — CARVEDILOL 6.25 MG PO TABS
6.2500 mg | ORAL_TABLET | Freq: Two times a day (BID) | ORAL | 3 refills | Status: AC
  Filled 2023-09-12: qty 180, 90d supply, fill #0

## 2023-09-12 MED ORDER — FUROSEMIDE 40 MG PO TABS
40.0000 mg | ORAL_TABLET | Freq: Two times a day (BID) | ORAL | 3 refills | Status: AC
  Filled 2023-09-12: qty 180, 90d supply, fill #0

## 2023-09-12 NOTE — Patient Instructions (Signed)
 Medication Instructions:  REFILLS sent in today  *If you need a refill on your cardiac medications before your next appointment, please call your pharmacy*  Follow-Up: At [facility], you and your health needs are our priority.  As part of our continuing mission to provide you with exceptional heart care, our providers are all part of one team.  This team includes your primary Cardiologist (physician) and Advanced Practice Providers or APPs (Physician Assistants and Nurse Practitioners) who all work together to provide you with the care you need, when you need it.  Your next appointment:   3 month(s)  Provider:   Newman JINNY Lawrence, MD

## 2023-09-12 NOTE — Progress Notes (Signed)
 Cardiology Office Note:  .   Date:  09/12/2023  ID:  Stephen Maldonado, DOB [birth year], MRN [id number] PCP: Stephen Dorn DELENA, MD  [facility] Providers Cardiologist:  Stephen Lawrence, MD PCP: Stephen Dorn DELENA, MD  Chief Complaint  Patient presents with   Coronary Artery Disease      History of Present Illness: .    Stephen Maldonado is a 68 y.o. male with hypertension, hyperlipidemia, moderate CAD, type II DM, h/o NICM with recovered LVEF, PAF, moderate obesity, OSA, s/p TAVR for severe AS   Patient is doing fairly well.  His exertional dyspnea remains unchanged, but has not had any recent dizziness or lightheadedness symptoms.  He has regular follow-up with nephrology, next BMP check due in September.  Vitals:   09/12/23 1101  BP: (!) 110/58  Pulse: 78  SpO2: 98%      ROS:  Review of Systems  Cardiovascular:  Positive for dyspnea on exertion. Negative for chest pain, leg swelling, palpitations and syncope.     Studies Reviewed: SABRA        EKG 09/12/2023: Sinus rhythm with 1st degree A-V block Prolonged QT When compared with ECG of 23-Jul-2023 14:07, No significant change was found      Risk Assessment/Calculations:    CHA2DS2-VASc Score = 3  This indicates a 3.23.2% annual risk of stroke. The patient's score is based upon: CHF History: 1 HTN History: 1 Diabetes History: 0 Stroke History: 0 Vascular Disease History: 0 Age Score: 1 Gender Score: 0   Labs 07/2023: Chol 205, TG 222, HDL 38, LDL 127 Hb 15.1 Cr 2.3, Na/K 134/3.1  10/2022-03/2023: Chol 173, TG 215, HDL 38, LDL 98 HbA1C 10.6% Hb 13.8 BUN 35, eGFR 33 K 4.7  Echocardiogram 01/2023: 1. Left ventricular ejection fraction, by estimation, is 55 to 60%. The  left ventricle has normal function. Left ventricular endocardial border  not optimally defined to evaluate regional wall motion. There is mild left  ventricular hypertrophy. Left  ventricular diastolic parameters are  consistent with Grade I diastolic  dysfunction (impaired relaxation). Elevated left atrial pressure.   2. Right ventricular systolic function is normal. The right ventricular  size is normal.   3. The mitral valve is normal in structure. No evidence of mitral valve  regurgitation. No evidence of mitral stenosis.   4. Edwards Sapien 3 THV size 26 mm is in the AV position. Mean gradient  11 mmHg is within normal published range and stable from Mar 06, 2022  study. . The aortic valve has been repaired/replaced. Aortic valve  regurgitation is not visualized. No aortic  stenosis is present.   5. The inferior vena cava is normal in size with greater than 50%  respiratory variability, suggesting right atrial pressure of 3 mmHg.   Physical Exam:   Physical Exam Vitals and nursing note reviewed.  Constitutional:      General: He is not in acute distress. Neck:     Vascular: No JVD.  Cardiovascular:     Rate and Rhythm: Normal rate and regular rhythm.     Heart sounds: Murmur heard.     Harsh midsystolic murmur is present with a grade of 2/6 at the upper right sternal border radiating to the neck.  Pulmonary:     Effort: Pulmonary effort is normal.     Breath sounds: Normal breath sounds. No wheezing or rales.  Musculoskeletal:     Right lower leg: No edema.     Left lower leg:  No edema.      VISIT DIAGNOSES: No diagnosis found.      ASSESSMENT AND PLAN: .    Stephen Maldonado is a 68 y.o. male with hypertension, hyperlipidemia, CAD, h/o NICM with recovered LVEF, PAF, moderate obesity, OSA, now s/p TAVR for severe AS   CAD: Known moderate to severe obstructive disease as noted on prior cath in 2023. No chest pain symptoms at this time, but continues to have exertional dyspnea.  It is quite possible that his exertional dyspnea is angina equivalent given that it has persisted even after 60 pound weight loss. Cardiac catheterization possible intervention remains an option, but  patient remains unsure about this. He is going to have follow-up with nephrology regarding his renal function. If and when we decide to do cardiac catheterization, will need to weigh the risk of contrast-induced nephropathy. I would prefer to perform cardiac catheterization, if and when done, when creatinine is at its lowest around 2.3 or so.  In the meantime, recommend antiplatelet therapy.  He reportedly has aspirin  allergy, therefore started on Plavix  75 mg daily.  He is tolerating and continue the same. Continue carvedilol , Imdur , added sublingual nitroglycerin .  HFrEF: With recovered LVEF. Continue Coreg  and Lasix .  Aortic stenosis: S/p TAVR Edwards Sapien 3 THV (size 26 mm) (01/2022). Valve functioning well.  Paroxysmal Afib: He has been been off Eliquis  due to cost issues, but recently started again.   Continue eliquis  5 mg bid.   Essential hypertension: Controlled.   OSA on CPAP: Tolerating well  F/u in 3 months  Signed, Stephen JINNY Lawrence, MD

## 2023-09-23 ENCOUNTER — Other Ambulatory Visit (HOSPITAL_COMMUNITY): Payer: Self-pay

## 2023-10-17 DIAGNOSIS — N184 Chronic kidney disease, stage 4 (severe): Secondary | ICD-10-CM | POA: Diagnosis not present

## 2023-10-22 DIAGNOSIS — I480 Paroxysmal atrial fibrillation: Secondary | ICD-10-CM | POA: Diagnosis not present

## 2023-10-22 DIAGNOSIS — N184 Chronic kidney disease, stage 4 (severe): Secondary | ICD-10-CM | POA: Diagnosis not present

## 2023-10-22 DIAGNOSIS — N139 Obstructive and reflux uropathy, unspecified: Secondary | ICD-10-CM | POA: Diagnosis not present

## 2023-10-22 DIAGNOSIS — I2510 Atherosclerotic heart disease of native coronary artery without angina pectoris: Secondary | ICD-10-CM | POA: Diagnosis not present

## 2023-10-22 DIAGNOSIS — I129 Hypertensive chronic kidney disease with stage 1 through stage 4 chronic kidney disease, or unspecified chronic kidney disease: Secondary | ICD-10-CM | POA: Diagnosis not present

## 2023-10-29 ENCOUNTER — Telehealth: Payer: Self-pay | Admitting: Cardiology

## 2023-10-29 NOTE — Telephone Encounter (Signed)
 Spoke with patient and he advises that Creatinine has improved and would like to move forward with heart cath. Pt admits that he is currently having a episode of vertigo. Pt denies shortness of breath and chest pain. Appt made fore 10/1 with APP to discuss setting up cath. ER precautions reviewed with patient and he agrees with plan of care.

## 2023-10-29 NOTE — Telephone Encounter (Signed)
 Pt's states his kidney functions have leveled out and is ready to schedule an appt to get stents put in

## 2023-10-29 NOTE — Telephone Encounter (Signed)
 Jackee, he will be seeing you next week.  Thank you.

## 2023-10-29 NOTE — Telephone Encounter (Signed)
 Sounds like a plan.  I would reiterate that I do not expect dizziness to get better with PCI.  Shortness of breath could improve with PCI, if it is due to the narrowing in his coronary artery.  Thanks MJP

## 2023-11-04 NOTE — Progress Notes (Signed)
 " Cardiology Office Note    Patient Name: Stephen Maldonado Date of Encounter: 11/04/2023  Primary Care Provider:  Charlott Dorn DELENA, MD Primary Cardiologist:  Newman JINNY Lawrence, MD Primary Electrophysiologist: None   Past Medical History    Past Medical History:  Diagnosis Date   Arthritis    knees   Bladder spasms    BPH (benign prostatic hyperplasia)    DVT (deep venous thrombosis) (HCC) ocyt 2016   left leg tx medically   GERD (gastroesophageal reflux disease)    History of colon polyps    hyperplastic 06-14-2010   Hydronephrosis    Hypertension    Obesity, morbid (HCC)    S/P TAVR (transcatheter aortic valve replacement) 01/15/2022   s/p TAVR with a 26 mm Edwards S3UR via the TF approach by Dr. Verlin & Dr. Murriel   Urinary retention     History of Present Illness  Stephen Maldonado is a 68 y.o. male with a PMH of CAD, Severe AS s/p TAVR 01/2022, paroxysmal AF (on Eliquis ) HTN, NICM, HLD, GERD, prior DVT, CKD stage IIIb, OSA who presents today for follow-up.  Stephen Maldonado has a past medical history significant for aortic stenosis with TAVR completed 01/2022.  He underwent left heart cath in 02/2021 that showed 80% stenosis of circumflex with 60% proximal RCA stenosis treated medically.  He was last seen by Dr. Penwarden on 09/12/2023 with ongoing complaint of exertional dyspnea.  He completed a 2D echo on 07/24/2023 that showed low normal EF of 50-55% with mildly reduced RV systolic function stable aortic valve gradient.  Discussion was made regarding shortness of breath being his anginal equivalent and patient was scheduled to follow-up with nephrology to discuss renal function.  He was started on Plavix  75 mg and presents today for preheart cath follow-up.  Stephen Maldonado presents today with his wife for follow-up. Stephen Maldonado is a 68 year old male with a history of TAVR who presents with dizziness and nausea. He has been experiencing severe dizziness and nausea since last  Tuesday afternoon. The dizziness was so intense that he had to hold onto objects to move around and required assistance to walk from one room to another. The symptoms persisted severely until Saturday or Sunday, after which he noticed some improvement. By yesterday, he felt better, and today he has not experienced dizziness, although he still does not feel well overall. During this episode, he did not experience any vomiting, although he gagged a few times. He was able to drink water  and eat small amounts of food, but he was not hungry. He recalls a similar episode several months ago when he was on Ozempic , which caused gastrointestinal symptoms including constipation and vomiting. He mentions experiencing tightness in his chest during the dizzy spells but no significant chest pain or shortness of breath during this recent episode. However, he notes a history of shortness of breath for years. He also reports headaches associated with the dizziness, located in his neck and shoulders, but no visual changes, auras, or flashing lights. He is currently on Plavix  and Eliquis  for blood thinning. Patient denies chest pain, palpitations, dyspnea, PND, orthopnea, nausea, vomiting, dizziness, syncope, edema, weight gain, or early satiety.  Discussed the use of AI scribe software for clinical note transcription with the patient, who gave verbal consent to proceed.  History of Present Illness   Review of Systems  Please see the history of present illness.    All other systems reviewed and are otherwise  negative except as noted above.  Physical Exam    Wt Readings from Last 3 Encounters:  09/12/23 281 lb (127.5 kg)  08/04/23 277 lb (125.6 kg)  07/25/23 275 lb (124.7 kg)   CD:Uyzmz were no vitals filed for this visit.,There is no height or weight on file to calculate BMI. GEN: Well nourished, well developed in no acute distress Neck: No JVD; No carotid bruits Pulmonary: Clear to auscultation without rales,  wheezing or rhonchi  Cardiovascular: Normal rate. Regular rhythm. Normal S1. Normal S2.   Murmurs: There is no murmur.  ABDOMEN: Soft, non-tender, non-distended EXTREMITIES:  No edema; No deformity   EKG/LABS/ Recent Cardiac Studies   ECG personally reviewed by me today -sinus rhythm with first-degree AVB and occasional PVCs with rate of 79 bpm and no acute changes.  Risk Assessment/Calculations:    CHA2DS2-VASc Score = 3   This indicates a 3.2% annual risk of stroke. The patient's score is based upon: CHF History: 1 HTN History: 1 Diabetes History: 0 Stroke History: 0 Vascular Disease History: 0 Age Score: 1 Gender Score: 0         Lab Results  Component Value Date   WBC 8.5 07/25/2023   HGB 15.1 07/25/2023   HCT 45.3 07/25/2023   MCV 82.5 07/25/2023   PLT 194 07/25/2023   Lab Results  Component Value Date   CREATININE 2.30 (H) 07/25/2023   BUN 60 (H) 07/25/2023   NA 134 (L) 07/25/2023   K 3.1 (L) 07/25/2023   CL 96 (L) 07/25/2023   CO2 25 07/25/2023   Lab Results  Component Value Date   CHOL 205 (H) 07/18/2023   HDL 38 (L) 07/18/2023   LDLCALC 127 (H) 07/18/2023   TRIG 222 (H) 07/18/2023   CHOLHDL 5.4 (H) 07/18/2023    No results found for: HGBA1C Assessment & Plan    Assessment & Plan  1.  History of CAD: -s/p 02/2021 that showed 80% stenosis of circumflex with 60% proximal RCA stenosis treated medically.  -Atherosclerotic heart disease with angina. Potential need for stent placement during upcoming cardiac catheterization if significant blockage identified. - Continue carvedilol  6.25 mg twice daily, Plavix  75 mg, Imdur  30 mg - Patient will undergo scheduled left heart catheterization with possible percutaneous coronary intervention - CBC and BMET today  2.  History of aortic stenosis: -s/p TAVR 01/2022 with most recent 2D echo completed showing stable EF of 50-55% and normal functioning aortic valve - VTE prophylaxis discussed - Continue  carvedilol  6.25 mg twice daily, Lasix  40 mg daily, metolazone  5 mg Monday, Wednesday, Friday  3.  Paroxysmal AF: - EKG today shows sinus rhythm with PACs and rate of 83 bpm - Continue rate control with carvedilol  6.25 mg twice daily, Eliquis  5 mg twice daily  4.  Essential HTN: - Patient's blood pressure today was stable at 112/68 - Continue carvedilol  6.25 mg twice daily, Imdur  30 mg daily  5.  HFrEF: - Patient euvolemic on examination today - Previous 2D echo completed showing EF of 50-55% - Continue Lasix  40 mg twice daily, Zaroxolyn  5 mg Monday, Wednesday, Friday, carvedilol  6.25 mg twice daily -Low sodium diet, fluid restriction <2L, and daily weights encouraged. Educated to contact our office for weight gain of 2 lbs overnight or 5 lbs in one week.    Disposition: Follow-up with Newman JINNY Lawrence, MD as scheduled Informed Consent   Shared Decision Making/Informed Consent The risks [stroke (1 in 1000), death (1 in 1000), kidney failure [usually temporary] (  1 in 500), bleeding (1 in 200), allergic reaction [possibly serious] (1 in 200)], benefits (diagnostic support and management of coronary artery disease) and alternatives of a cardiac catheterization were discussed in detail with Mr. Hove and he is willing to proceed.      Signed, Wyn Raddle, Jackee Shove, NP 11/04/2023, 10:04 AM [facility] "

## 2023-11-05 ENCOUNTER — Ambulatory Visit: Attending: Internal Medicine | Admitting: Nurse Practitioner

## 2023-11-05 ENCOUNTER — Encounter: Payer: Self-pay | Admitting: Nurse Practitioner

## 2023-11-05 VITALS — BP 112/68 | HR 83 | Ht 70.5 in | Wt 270.8 lb

## 2023-11-05 DIAGNOSIS — Z952 Presence of prosthetic heart valve: Secondary | ICD-10-CM | POA: Diagnosis not present

## 2023-11-05 DIAGNOSIS — I480 Paroxysmal atrial fibrillation: Secondary | ICD-10-CM | POA: Diagnosis not present

## 2023-11-05 DIAGNOSIS — I10 Essential (primary) hypertension: Secondary | ICD-10-CM

## 2023-11-05 DIAGNOSIS — I25118 Atherosclerotic heart disease of native coronary artery with other forms of angina pectoris: Secondary | ICD-10-CM | POA: Diagnosis not present

## 2023-11-05 DIAGNOSIS — I5022 Chronic systolic (congestive) heart failure: Secondary | ICD-10-CM | POA: Diagnosis not present

## 2023-11-05 NOTE — Patient Instructions (Addendum)
 Medication Instructions:  Your physician recommends that you continue on your current medications as directed. Please refer to the Current Medication list given to you today. *If you need a refill on your cardiac medications before your next appointment, please call your pharmacy*  Lab Work: TODAY-BMET & CBC If you have labs (blood work) drawn today and your tests are completely normal, you will receive your results only by: MyChart Message (if you have MyChart) OR A paper copy in the mail If you have any lab test that is abnormal or we need to change your treatment, we will call you to review the results.  Testing/Procedures: Your physician has requested that you have a cardiac catheterization. Cardiac catheterization is used to diagnose and/or treat various heart conditions. Doctors may recommend this procedure for a number of different reasons. The most common reason is to evaluate chest pain. Chest pain can be a symptom of coronary artery disease (CAD), and cardiac catheterization can show whether plaque is narrowing or blocking your heart's arteries. This procedure is also used to evaluate the valves, as well as measure the blood flow and oxygen  levels in different parts of your heart. For further information please visit [email or web address] Please follow instruction sheet, as given.  Follow-Up: At [facility], you and your health needs are our priority.  As part of our continuing mission to provide you with exceptional heart care, our providers are all part of one team.  This team includes your primary Cardiologist (physician) and Advanced Practice Providers or APPs (Physician Assistants and Nurse Practitioners) who all work together to provide you with the care you need, when you need it.  Your next appointment:   AS SCHEDULED   Provider:   Newman JINNY Lawrence, MD    We recommend signing up for the patient portal called MyChart.  Sign up information is provided on this  After Visit Summary.  MyChart is used to connect with patients for Virtual Visits (Telemedicine).  Patients are able to view lab/test results, encounter notes, upcoming appointments, etc.  Non-urgent messages can be sent to your provider as well.   To learn more about what you can do with MyChart, go to [email or web address].   Other Instructions        Cardiac Catheterization   You are scheduled for a Cardiac Catheterization on Tuesday, October 14 with Dr. Newman Lawrence.  1. Please arrive at the [facility] (Main Entrance A) at [facility]: [address] [place], [place] [address] at 10:00 AM (This time is 2 hour(s) before your procedure to ensure your preparation).   Free valet parking service is available. You will check in at ADMITTING. The support person will be asked to wait in the waiting room.  It is OK to have someone drop you off and come back when you are ready to be discharged.        Special note: Every effort is made to have your procedure done on time. Please understand that emergencies sometimes delay scheduled procedures.  2. Diet: Light meals may be eaten up to 6 hours before scheduled procedures from 12N and after; please stop eating at 6:00 AM   Light meal consist of plain toast, fruit, light soups, crackers.  3. Hydration:On October 14, you may drink approved liquids (see below) until 2 hours before the procedure with 8 oz of water as your last intake.   List of approved liquids water, clear juice, clear tea, black coffee, fruit juices, non-citric and without pulp,  carbonated beverages, Gatorade, Kool -Aid, plain Jello-O and plain ice popsicles.  4. Labs: You will need to have blood drawn on Wednesday, October 1 at [facility] (1st Floor), [address], [place], [place] [address]. You do not need to be fasting.  5. Medication instructions in preparation for your procedure:   Contrast Allergy:  No  Stop taking Eliquis  (Apixiban) on Sunday, October 12. (DO NOT TAKE ANY MORE AFTER SATURDAY EVENING DOSE)  Stop taking, Lasix  (Furosemide )  Tuesday, October 14, (HOLD ON THE MORNING OF CATH)  On the morning of your procedure, take Aspirin  81 mg and any morning medicines NOT listed above.  You may use sips of water.  6. Plan to go home the same day, you will only stay overnight if medically necessary. 7. You MUST have a responsible adult to drive you home. 8. An adult MUST be with you the first 24 hours after you arrive home. 9. Bring a current list of your medications, and the last time and date medication taken. 10. Bring ID and current insurance cards. 11.Please wear clothes that are easy to get on and off and wear slip-on shoes.  Thank you for allowing us  to care for you!   -- [facility]

## 2023-11-06 ENCOUNTER — Ambulatory Visit: Payer: Self-pay | Admitting: Nurse Practitioner

## 2023-11-06 LAB — CBC
Hematocrit: 46.6 % (ref 37.5–51.0)
Hemoglobin: 15.4 g/dL (ref 13.0–17.7)
MCH: 29.2 pg (ref 26.6–33.0)
MCHC: 33 g/dL (ref 31.5–35.7)
MCV: 88 fL (ref 79–97)
Platelets: 222 x10E3/uL (ref 150–450)
RBC: 5.27 x10E6/uL (ref 4.14–5.80)
RDW: 13.8 % (ref 11.6–15.4)
WBC: 9.7 x10E3/uL (ref 3.4–10.8)

## 2023-11-06 LAB — BASIC METABOLIC PANEL WITH GFR
BUN/Creatinine Ratio: 16 (ref 10–24)
BUN: 30 mg/dL — AB (ref 8–27)
CO2: 24 mmol/L (ref 20–29)
Calcium: 9.4 mg/dL (ref 8.6–10.2)
Chloride: 98 mmol/L (ref 96–106)
Creatinine, Ser: 1.9 mg/dL — AB (ref 0.76–1.27)
Glucose: 124 mg/dL — AB (ref 70–99)
Potassium: 4 mmol/L (ref 3.5–5.2)
Sodium: 140 mmol/L (ref 134–144)
eGFR: 38 mL/min/1.73 — AB (ref >59)

## 2023-11-13 ENCOUNTER — Telehealth: Payer: Self-pay | Admitting: *Deleted

## 2023-11-13 NOTE — Telephone Encounter (Addendum)
 Cardiac Catheterization scheduled at [facility] for: Tuesday November 18, 2023 12 Noon Arrival time [facility] Main Entrance A at: 7 AM-pre-procedure hydration  Diet:  -May have light meal until 6 AM. (6 hours before procedure time) Approved light meal consists of plain toast, fruit, light soups, crackers.  Hydration: -May drink clear liquids until 2 hours before the procedure.  Approved liquids: Water, clear tea, black coffee, fruit juices-non-citric and without pulp,Gatorade, plain Jello/popsicles.  Medication instructions: -Hold:  Eliquis -none 11/16/23 until post procedure  Lasix /KCl/Metolazone -day before and day of procedure-per protocol eGFR < 60 (38) -Other usual morning medications can be taken including aspirin  81 mg and Plavix  75 mg  Plan to go home the same day, you will only stay overnight if medically necessary.  You must have responsible adult to drive you home.  Someone must be with you the first 24 hours after you arrive home.  Reviewed procedure instructions/pre-procedure hydration with patient's wife (DPR), Devere.

## 2023-11-18 ENCOUNTER — Ambulatory Visit (HOSPITAL_COMMUNITY)
Admission: RE | Admit: 2023-11-18 | Discharge: 2023-11-18 | Disposition: A | Discharge status: Home / Self Care | Attending: Cardiology | Admitting: Cardiology

## 2023-11-18 ENCOUNTER — Encounter (HOSPITAL_COMMUNITY)
Admission: RE | Disposition: A | Discharge status: Home / Self Care | Payer: Self-pay | Source: Home / Self Care | Attending: Cardiology

## 2023-11-18 ENCOUNTER — Other Ambulatory Visit: Payer: Self-pay

## 2023-11-18 DIAGNOSIS — I130 Hypertensive heart and chronic kidney disease with heart failure and stage 1 through stage 4 chronic kidney disease, or unspecified chronic kidney disease: Secondary | ICD-10-CM | POA: Insufficient documentation

## 2023-11-18 DIAGNOSIS — I25118 Atherosclerotic heart disease of native coronary artery with other forms of angina pectoris: Secondary | ICD-10-CM | POA: Diagnosis present

## 2023-11-18 DIAGNOSIS — Z79899 Other long term (current) drug therapy: Secondary | ICD-10-CM | POA: Insufficient documentation

## 2023-11-18 DIAGNOSIS — I5020 Unspecified systolic (congestive) heart failure: Secondary | ICD-10-CM | POA: Diagnosis not present

## 2023-11-18 DIAGNOSIS — I2510 Atherosclerotic heart disease of native coronary artery without angina pectoris: Secondary | ICD-10-CM

## 2023-11-18 DIAGNOSIS — N1832 Chronic kidney disease, stage 3b: Secondary | ICD-10-CM | POA: Insufficient documentation

## 2023-11-18 DIAGNOSIS — I428 Other cardiomyopathies: Secondary | ICD-10-CM | POA: Diagnosis not present

## 2023-11-18 DIAGNOSIS — Z86718 Personal history of other venous thrombosis and embolism: Secondary | ICD-10-CM | POA: Diagnosis not present

## 2023-11-18 DIAGNOSIS — Z7901 Long term (current) use of anticoagulants: Secondary | ICD-10-CM | POA: Diagnosis not present

## 2023-11-18 DIAGNOSIS — I480 Paroxysmal atrial fibrillation: Secondary | ICD-10-CM | POA: Insufficient documentation

## 2023-11-18 DIAGNOSIS — Z952 Presence of prosthetic heart valve: Secondary | ICD-10-CM | POA: Insufficient documentation

## 2023-11-18 HISTORY — PX: LEFT HEART CATH AND CORONARY ANGIOGRAPHY: CATH118249

## 2023-11-18 HISTORY — PX: CORONARY PRESSURE/FFR STUDY: CATH118243

## 2023-11-18 LAB — POCT ACTIVATED CLOTTING TIME: Activated Clotting Time: 227 s

## 2023-11-18 SURGERY — LEFT HEART CATH AND CORONARY ANGIOGRAPHY
Anesthesia: LOCAL

## 2023-11-18 MED ORDER — ONDANSETRON HCL 4 MG/2ML IJ SOLN: 4.0000 mg | Freq: Four times a day (QID) | INTRAMUSCULAR | Status: DC | PRN

## 2023-11-18 MED ORDER — HEPARIN SODIUM (PORCINE) 1000 UNIT/ML IJ SOLN
INTRAMUSCULAR | Status: AC
  Filled 2023-11-18: qty 10

## 2023-11-18 MED ORDER — FENTANYL CITRATE (PF) 100 MCG/2ML IJ SOLN
INTRAMUSCULAR | Status: DC | PRN
  Administered 2023-11-18: 25 ug via INTRAVENOUS
  Administered 2023-11-18: 50 ug via INTRAVENOUS

## 2023-11-18 MED ORDER — IOHEXOL 350 MG/ML SOLN
INTRAVENOUS | Status: DC | PRN
  Administered 2023-11-18: 20 mL

## 2023-11-18 MED ORDER — SODIUM CHLORIDE 0.9% FLUSH: 3.0000 mL | INTRAVENOUS | Status: DC | PRN

## 2023-11-18 MED ORDER — MIDAZOLAM HCL 2 MG/2ML IJ SOLN
INTRAMUSCULAR | Status: DC | PRN
  Administered 2023-11-18 (×2): 1 mg via INTRAVENOUS

## 2023-11-18 MED ORDER — ACETAMINOPHEN 325 MG PO TABS: 650.0000 mg | ORAL_TABLET | ORAL | Status: DC | PRN

## 2023-11-18 MED ORDER — SODIUM CHLORIDE 0.9 % WEIGHT BASED INFUSION
3.0000 mL/kg/h | INTRAVENOUS | Status: AC
Start: 2023-11-18 — End: 2023-11-18
  Administered 2023-11-18: 3 mL/kg/h via INTRAVENOUS

## 2023-11-18 MED ORDER — FREE WATER: 500.0000 mL | Freq: Once | Status: DC

## 2023-11-18 MED ORDER — HEPARIN SODIUM (PORCINE) 1000 UNIT/ML IJ SOLN
INTRAMUSCULAR | Status: DC | PRN
  Administered 2023-11-18: 12000 [IU] via INTRAVENOUS

## 2023-11-18 MED ORDER — LIDOCAINE HCL (PF) 1 % IJ SOLN
INTRAMUSCULAR | Status: AC
  Filled 2023-11-18: qty 30

## 2023-11-18 MED ORDER — SODIUM CHLORIDE 0.9 % IV SOLN: 250.0000 mL | INTRAVENOUS | Status: DC | PRN

## 2023-11-18 MED ORDER — FENTANYL CITRATE (PF) 100 MCG/2ML IJ SOLN
INTRAMUSCULAR | Status: AC
  Filled 2023-11-18: qty 2

## 2023-11-18 MED ORDER — LABETALOL HCL 5 MG/ML IV SOLN: 10.0000 mg | INTRAVENOUS | Status: DC | PRN

## 2023-11-18 MED ORDER — LIDOCAINE HCL (PF) 1 % IJ SOLN
INTRAMUSCULAR | Status: DC | PRN
  Administered 2023-11-18: 5 mL via INTRADERMAL

## 2023-11-18 MED ORDER — MIDAZOLAM HCL 2 MG/2ML IJ SOLN
INTRAMUSCULAR | Status: AC
  Filled 2023-11-18: qty 2

## 2023-11-18 MED ORDER — HYDRALAZINE HCL 20 MG/ML IJ SOLN: 10.0000 mg | INTRAMUSCULAR | Status: DC | PRN

## 2023-11-18 MED ORDER — SODIUM CHLORIDE 0.9% FLUSH: 3.0000 mL | Freq: Two times a day (BID) | INTRAVENOUS | Status: DC

## 2023-11-18 MED ORDER — SODIUM CHLORIDE 0.9 % WEIGHT BASED INFUSION: 1.0000 mL/kg/h | INTRAVENOUS | Status: DC

## 2023-11-18 MED ORDER — HEPARIN (PORCINE) IN NACL 1000-0.9 UT/500ML-% IV SOLN
INTRAVENOUS | Status: DC | PRN
  Administered 2023-11-18: 1000 mL

## 2023-11-18 MED ORDER — VERAPAMIL HCL 2.5 MG/ML IV SOLN
INTRAVENOUS | Status: AC
Start: 2023-11-18 — End: 2023-11-18
  Filled 2023-11-18: qty 2

## 2023-11-18 MED ORDER — ASPIRIN 81 MG PO CHEW: 81.0000 mg | CHEWABLE_TABLET | ORAL | Status: DC

## 2023-11-18 SURGICAL SUPPLY — 14 items
CATH INFINITI 5FR MULTPACK ANG (CATHETERS) IMPLANT
CATH LAUNCHER 6FR EBU3.5 (CATHETERS) IMPLANT
CATH LAUNCHER 6FR JL4 (CATHETERS) IMPLANT
CATH VISTA GUIDE 6FR JL4 ECOPK (CATHETERS) IMPLANT
CLOSURE MYNX CONTROL 6F/7F (Vascular Products) IMPLANT
COVER PRB 48X5XTLSCP FOLD TPE (BAG) IMPLANT
GUIDEWIRE PRESSURE X 175 (WIRE) IMPLANT
KIT HEMO VALVE WATCHDOG (MISCELLANEOUS) IMPLANT
PACK CARDIAC CATHETERIZATION (CUSTOM PROCEDURE TRAY) ×1 IMPLANT
SET ATX-X65L (MISCELLANEOUS) IMPLANT
SHEATH PINNACLE 5F 10CM (SHEATH) IMPLANT
SHEATH PINNACLE 6F 10CM (SHEATH) IMPLANT
WIRE EMERALD 3MM-J .035X150CM (WIRE) IMPLANT
WIRE MICRO SET SILHO 5FR 7 (SHEATH) IMPLANT

## 2023-11-18 NOTE — Interval H&P Note (Signed)
 History and Physical Interval Note:  11/18/2023 3:55 PM  Stephen Maldonado  has presented today for surgery, with the diagnosis of chest pain.  The various methods of treatment have been discussed with the patient and family. After consideration of risks, benefits and other options for treatment, the patient has consented to  Procedure(s): LEFT HEART CATH AND CORONARY ANGIOGRAPHY (N/A) as a surgical intervention.  The patient's history has been reviewed, patient examined, no change in status, stable for surgery.  I have reviewed the patient's chart and labs.  Questions were answered to the patient's satisfaction.     Lauralyn Shadowens J Allye Hoyos

## 2023-11-18 NOTE — H&P (Signed)
 OV 11/05/2023 copied for documentation   Cardiology Office Note    Patient Name: Stephen Maldonado Date of Encounter: 11/18/2023  Primary Care Provider:  Charlott Dorn DELENA, MD Primary Cardiologist:  Newman JINNY Lawrence, MD Primary Electrophysiologist: None   Past Medical History    Past Medical History:  Diagnosis Date   Arthritis    knees   Bladder spasms    BPH (benign prostatic hyperplasia)    DVT (deep venous thrombosis) (HCC) ocyt 2016   left leg tx medically   GERD (gastroesophageal reflux disease)    History of colon polyps    hyperplastic 06-14-2010   Hydronephrosis    Hypertension    Obesity, morbid (HCC)    S/P TAVR (transcatheter aortic valve replacement) 01/15/2022   s/p TAVR with a 26 mm Edwards S3UR via the TF approach by Dr. Verlin & Dr. Murriel   Urinary retention     History of Present Illness  Stephen Maldonado is a 68 y.o. male with a PMH of CAD, Severe AS s/p TAVR 01/2022, paroxysmal AF (on Eliquis ) HTN, NICM, HLD, GERD, prior DVT, CKD stage IIIb, OSA who presents today for follow-up.  Stephen Maldonado has a past medical history significant for aortic stenosis with TAVR completed 01/2022.  He underwent left heart cath in 02/2021 that showed 80% stenosis of circumflex with 60% proximal RCA stenosis treated medically.  He was last seen by Dr. Penwarden on 09/12/2023 with ongoing complaint of exertional dyspnea.  He completed a 2D echo on 07/24/2023 that showed low normal EF of 50-55% with mildly reduced RV systolic function stable aortic valve gradient.  Discussion was made regarding shortness of breath being his anginal equivalent and patient was scheduled to follow-up with nephrology to discuss renal function.  He was started on Plavix  75 mg and presents today for preheart cath follow-up.  Stephen Maldonado presents today with his wife for follow-up. Stephen Maldonado is a 68 year old male with a history of TAVR who presents with dizziness and nausea. He has been experiencing  severe dizziness and nausea since last Tuesday afternoon. The dizziness was so intense that he had to hold onto objects to move around and required assistance to walk from one room to another. The symptoms persisted severely until Saturday or Sunday, after which he noticed some improvement. By yesterday, he felt better, and today he has not experienced dizziness, although he still does not feel well overall. During this episode, he did not experience any vomiting, although he gagged a few times. He was able to drink water and eat small amounts of food, but he was not hungry. He recalls a similar episode several months ago when he was on Ozempic , which caused gastrointestinal symptoms including constipation and vomiting. He mentions experiencing tightness in his chest during the dizzy spells but no significant chest pain or shortness of breath during this recent episode. However, he notes a history of shortness of breath for years. He also reports headaches associated with the dizziness, located in his neck and shoulders, but no visual changes, auras, or flashing lights. He is currently on Plavix  and Eliquis  for blood thinning. Patient denies chest pain, palpitations, dyspnea, PND, orthopnea, nausea, vomiting, dizziness, syncope, edema, weight gain, or early satiety.  Discussed the use of AI scribe software for clinical note transcription with the patient, who gave verbal consent to proceed.  History of Present Illness   Review of Systems  Please see the history of present illness.    All  other systems reviewed and are otherwise negative except as noted above.  Physical Exam    Wt Readings from Last 3 Encounters:  11/18/23 124.7 kg  11/05/23 122.8 kg  09/12/23 127.5 kg   VS: Vitals:   11/18/23 0801 11/18/23 1554  BP: 135/66   Pulse: 66   Resp: 17   Temp: 97.6 F (36.4 C)   SpO2: 98% 97%  ,Body mass index is 38.35 kg/m. GEN: Well nourished, well developed in no acute distress Neck: No  JVD; No carotid bruits Pulmonary: Clear to auscultation without rales, wheezing or rhonchi  Cardiovascular: Normal rate. Regular rhythm. Normal S1. Normal S2.   Murmurs: There is no murmur.  ABDOMEN: Soft, non-tender, non-distended EXTREMITIES:  No edema; No deformity   EKG/LABS/ Recent Cardiac Studies   ECG personally reviewed by me today -sinus rhythm with first-degree AVB and occasional PVCs with rate of 79 bpm and no acute changes.  Risk Assessment/Calculations:    CHA2DS2-VASc Score = 3   This indicates a 3.2% annual risk of stroke. The patient's score is based upon: CHF History: 1 HTN History: 1 Diabetes History: 0 Stroke History: 0 Vascular Disease History: 0 Age Score: 1 Gender Score: 0         Lab Results  Component Value Date   WBC 9.7 11/05/2023   HGB 15.4 11/05/2023   HCT 46.6 11/05/2023   MCV 88 11/05/2023   PLT 222 11/05/2023   Lab Results  Component Value Date   CREATININE 1.90 (H) 11/05/2023   BUN 30 (H) 11/05/2023   NA 140 11/05/2023   K 4.0 11/05/2023   CL 98 11/05/2023   CO2 24 11/05/2023   Lab Results  Component Value Date   CHOL 205 (H) 07/18/2023   HDL 38 (L) 07/18/2023   LDLCALC 127 (H) 07/18/2023   TRIG 222 (H) 07/18/2023   CHOLHDL 5.4 (H) 07/18/2023    No results found for: HGBA1C Assessment & Plan    Assessment & Plan  1.  History of CAD: -s/p 02/2021 that showed 80% stenosis of circumflex with 60% proximal RCA stenosis treated medically.  -Atherosclerotic heart disease with angina. Potential need for stent placement during upcoming cardiac catheterization if significant blockage identified. - Continue carvedilol  6.25 mg twice daily, Plavix  75 mg, Imdur  30 mg - Patient will undergo scheduled left heart catheterization with possible percutaneous coronary intervention - CBC and BMET today  2.  History of aortic stenosis: -s/p TAVR 01/2022 with most recent 2D echo completed showing stable EF of 50-55% and normal functioning  aortic valve - VTE prophylaxis discussed - Continue carvedilol  6.25 mg twice daily, Lasix  40 mg daily, metolazone  5 mg Monday, Wednesday, Friday  3.  Paroxysmal AF: - EKG today shows sinus rhythm with PACs and rate of 83 bpm - Continue rate control with carvedilol  6.25 mg twice daily, Eliquis  5 mg twice daily  4.  Essential HTN: - Patient's blood pressure today was stable at 112/68 - Continue carvedilol  6.25 mg twice daily, Imdur  30 mg daily  5.  HFrEF: - Patient euvolemic on examination today - Previous 2D echo completed showing EF of 50-55% - Continue Lasix  40 mg twice daily, Zaroxolyn  5 mg Monday, Wednesday, Friday, carvedilol  6.25 mg twice daily -Low sodium diet, fluid restriction <2L, and daily weights encouraged. Educated to contact our office for weight gain of 2 lbs overnight or 5 lbs in one week.    Disposition: Follow-up with Newman JINNY Lawrence, MD as scheduled Informed Consent   Shared  Decision Making/Informed Consent The risks [stroke (1 in 1000), death (1 in 1000), kidney failure [usually temporary] (1 in 500), bleeding (1 in 200), allergic reaction [possibly serious] (1 in 200)], benefits (diagnostic support and management of coronary artery disease) and alternatives of a cardiac catheterization were discussed in detail with Stephen Maldonado and he is willing to proceed.      Signed, Newman JINNY Lawrence, NP 11/18/2023, 3:55 PM [facility]

## 2023-11-18 NOTE — Discharge Instructions (Signed)
 Femoral Site Care This sheet gives you information about how to care for yourself after your procedure. Your health care provider may also give you more specific instructions. If you have problems or questions, contact your health care provider. What can I expect after the procedure?  After the procedure, it is common to have: Bruising that usually fades within 1-2 weeks. Tenderness at the site. Follow these instructions at home: Wound care Follow instructions from your health care provider about how to take care of your insertion site. Make sure you: Wash your hands with soap and water  before you change your bandage (dressing). If soap and water  are not available, use hand sanitizer. Remove your dressing as told by your health care provider. 24 hours Do not take baths, swim, or use a hot tub until your health care provider approves. You may shower 24-48 hours after the procedure or as told by your health care provider. Gently wash the site with plain soap and water . Pat the area dry with a clean towel. Do not rub the site. This may cause bleeding. Do not apply powder or lotion to the site. Keep the site clean and dry. Check your femoral site every day for signs of infection. Check for: Redness, swelling, or pain. Fluid or blood. Warmth. Pus or a bad smell. Activity For the first 2-3 days after your procedure, or as long as directed: Avoid climbing stairs as much as possible. Do not squat. Do not lift anything that is heavier than 10 lb (4.5 kg), or the limit that you are told, until your health care provider says that it is safe. For 5 days Rest as directed. Avoid sitting for a long time without moving. Get up to take short walks every 1-2 hours. Do not drive for 24 hours if you were given a medicine to help you relax (sedative). General instructions Take over-the-counter and prescription medicines only as told by your health care provider. Keep all follow-up visits as told by your  health care provider. This is important. Contact a health care provider if you have: A fever or chills. You have redness, swelling, or pain around your insertion site. Get help right away if: The catheter insertion area swells very fast. You pass out. You suddenly start to sweat or your skin gets clammy. The catheter insertion area is bleeding, and the bleeding does not stop when you hold steady pressure on the area. The area near or just beyond the catheter insertion site becomes pale, cool, tingly, or numb. These symptoms may represent a serious problem that is an emergency. Do not wait to see if the symptoms will go away. Get medical help right away. Call your local emergency services (911 in the [place] Do not drive yourself to the hospital. Summary After the procedure, it is common to have bruising that usually fades within 1-2 weeks. Check your femoral site every day for signs of infection. Do not lift anything that is heavier than 10 lb (4.5 kg), or the limit that you are told, until your health care provider says that it is safe. This information is not intended to replace advice given to you by your health care provider. Make sure you discuss any questions you have with your health care provider. Document Revised: 02/03/2017 Document Reviewed: 02/03/2017 Elsevier Patient Education  2020 [employer].

## 2023-11-18 NOTE — Progress Notes (Signed)
 Patient ambulated to the bathroom and voided. No hematoma or bleeding noted. Dressing to right groin clean, dry, and intact.  Patient assisted with getting dressed for discharge. After patient fully dressed site assessed, no hematoma or bleeding noted. Patient and wife received discharge instructions and no concerns voiced. Patient discharged home.

## 2023-11-19 ENCOUNTER — Encounter (HOSPITAL_COMMUNITY): Payer: Self-pay | Admitting: Cardiology

## 2023-12-01 ENCOUNTER — Telehealth: Payer: Self-pay | Admitting: Pharmacist

## 2023-12-01 NOTE — Progress Notes (Signed)
   12/01/2023  Patient ID: Stephen Maldonado, male   DOB: 1955-07-12, 68 y.o.   MRN: [id number]  Called and spoke with the patient again for a quick check-in. Reports stopping the Ozempic  about 3 weeks ago because it was causing dizzy spells. Since stopping it, he hasn't had that issue. Still not checking BG's. Reviewed potential to use a CGM to track them at all times once again for readings. Did not feel this was very important at this time.  Asked about Imdur  refill. Advised I would look into this. Upon review, it appears patient picked up a 90DS on 10/24/23. He should have enough until mid-December.   Lastly, said he spoke to a nurse prior to the office visit he missed. Said he was out-of-town and needed to reschedule, so the nurse was going to transfer him to the scheduler but hung up after 20 minutes on hold. Then tried calling the office again to try a different way of rescheduling and was on-hold for a while once again. Would prefer to have the scheduler reach out to him to reschedule this visit.   Believes recent heart catheterization procedure went well and he does not need stents. Has follow-up with them on Monday.    Aloysius Lewis, PharmD, [facility] Phone Number: [phone]

## 2023-12-06 ENCOUNTER — Other Ambulatory Visit: Payer: Self-pay | Admitting: Cardiology

## 2023-12-08 ENCOUNTER — Ambulatory Visit: Attending: Cardiology | Admitting: Cardiology

## 2023-12-08 ENCOUNTER — Encounter: Payer: Self-pay | Admitting: Cardiology

## 2023-12-08 VITALS — BP 110/76 | HR 81 | Ht 71.0 in | Wt 275.2 lb

## 2023-12-08 DIAGNOSIS — I10 Essential (primary) hypertension: Secondary | ICD-10-CM | POA: Diagnosis not present

## 2023-12-08 DIAGNOSIS — I25118 Atherosclerotic heart disease of native coronary artery with other forms of angina pectoris: Secondary | ICD-10-CM | POA: Diagnosis not present

## 2023-12-08 DIAGNOSIS — I480 Paroxysmal atrial fibrillation: Secondary | ICD-10-CM

## 2023-12-08 NOTE — Addendum Note (Signed)
 Addended by: MANDA BOTTCHER B on: 12/08/2023 04:50 PM   Modules accepted: Orders

## 2023-12-08 NOTE — Progress Notes (Signed)
 Cardiology Office Note:  .   Date:  12/08/2023  ID:  Stephen Maldonado, DOB [birth year], MRN [id number] PCP: Charlott Dorn DELENA, MD  [facility] Providers Cardiologist:  Newman Lawrence, MD PCP: Charlott Dorn DELENA, MD  Chief Complaint  Patient presents with   Coronary Artery Disease      History of Present Illness: .    Stephen Maldonado is a 68 y.o. male with hypertension, hyperlipidemia, moderate CAD, type II DM, h/o NICM with recovered LVEF, PAF, moderate obesity, OSA, s/p TAVR for severe AS   Patient is doing fairly well.  Stable exertional dyspnea, but no chest pain.  Reviewed recent heart catheterization results with the patient and 1 interpreter.    Vitals:   12/08/23 1322  BP: 110/76  Pulse: 81  SpO2: 93%     ROS:  Review of Systems  Cardiovascular:  Positive for dyspnea on exertion. Negative for chest pain, leg swelling, palpitations and syncope.      Risk Assessment/Calculations:    CHA2DS2-VASc Score = 3  This indicates a 3.23.2% annual risk of stroke. The patient's score is based upon: CHF History: 1 HTN History: 1 Diabetes History: 0 Stroke History: 0 Vascular Disease History: 0 Age Score: 1 Gender Score: 0   Studies Reviewed: SABRA        EKG 12/08/2023: Sinus rhythm with 1st degree A-V block When compared with ECG of 18-Nov-2023 17:07, No significant change was found       Echocardiogram 07/2023:  1. Left ventricular ejection fraction, by estimation, is 50 to 55%. The  left ventricle has low normal function.   2. Right ventricular systolic function is mildly reduced. The right  ventricular size is mildly enlarged.   3. The mitral valve is normal in structure. No evidence of mitral valve  regurgitation.   4. The aortic valve has been repaired/replaced. Aortic valve mean  gradient measures 7.0 mmHg. Aortic valve Vmax measures 1.75 m/s.    Coronary angiography 11/18/2023: LM: Normal LAD: Minimal luminal irregularities Lcx: Mid  75% stenosis, RFR 0.94 RCA: Prox 60% (RFR 0.97) mid 40% stenoses   LVEDP 19 mmHg      Conclusion: Moderate nonobstructive coronary artery disease Continue medical management Exertional dyspnea could be related to obesity   Labs 11/2023: Hb 15.4 Cr 1.9, eGFR 38  Labs 07/2023: Chol 205, TG 222, HDL 38, LDL 127 Hb 15.1 Cr 2.3, Na/K 134/3.1  10/2022-03/2023: Chol 173, TG 215, HDL 38, LDL 98 HbA1C 10.6% Hb 13.8 BUN 35, eGFR 33 K 4.7   Physical Exam:   Physical Exam Vitals and nursing note reviewed.  Constitutional:      General: He is not in acute distress. Neck:     Vascular: No JVD.  Cardiovascular:     Rate and Rhythm: Normal rate and regular rhythm.     Heart sounds: Murmur heard.     Harsh midsystolic murmur is present with a grade of 2/6 at the upper right sternal border radiating to the neck.  Pulmonary:     Effort: Pulmonary effort is normal.     Breath sounds: Normal breath sounds. No wheezing or rales.  Musculoskeletal:     Right lower leg: No edema.     Left lower leg: No edema.       VISIT DIAGNOSES:   ICD-10-CM   1. PAF (paroxysmal atrial fibrillation) (HCC)  I48.0 EKG 12-Lead    2. Essential hypertension  I10 EKG 12-Lead    3. Coronary artery disease  of native artery of native heart with stable angina pectoris  I25.118           ASSESSMENT AND PLAN: .    Stephen Maldonado is a 68 y.o. male with hypertension, hyperlipidemia, CAD, h/o NICM with recovered LVEF, PAF, moderate obesity, OSA, now s/p TAVR for severe AS   CAD: Nonobstructive coronary artery disease (cath 11/2023). With nonobstructive CAD, and ongoing use of Eliquis  for A-fib, reasonable to omit antiplatelet therapy. Continue carvedilol , Imdur .  HFrEF: With recovered LVEF. Continue Coreg  and Lasix .  Aortic stenosis: S/p TAVR Edwards Sapien 3 THV (size 26 mm) (01/2022). Valve functioning well.  Paroxysmal Afib: He has been been off Eliquis  due to cost issues, but recently  started again.   Continue eliquis  5 mg bid.   Essential hypertension: Controlled.   OSA on CPAP: Tolerating well  F/u in 1 year  Signed, Newman JINNY Lawrence, MD

## 2023-12-08 NOTE — Patient Instructions (Addendum)
 Medication Instructions:  STOP Plavix  (pt called and advised, pt agrees with care)  *If you need a refill on your cardiac medications before your next appointment, please call your pharmacy*  Lab Work: None If you have labs (blood work) drawn today and your tests are completely normal, you will receive your results only by: MyChart Message (if you have MyChart) OR A paper copy in the mail If you have any lab test that is abnormal or we need to change your treatment, we will call you to review the results.  Testing/Procedures: None  Follow-Up: At [facility], you and your health needs are our priority.  As part of our continuing mission to provide you with exceptional heart care, our providers are all part of one team.  This team includes your primary Cardiologist (physician) and Advanced Practice Providers or APPs (Physician Assistants and Nurse Practitioners) who all work together to provide you with the care you need, when you need it.  Your next appointment:   1 year(s)  Provider:   Newman JINNY Lawrence, MD
